# Patient Record
Sex: Female | Born: 1940 | Race: White | Hispanic: No | Smoking: Former smoker
Health system: Southern US, Community
[De-identification: ages and names within clinical notes are randomized; demographics above are authoritative.]

## PROBLEM LIST (undated history)

## (undated) DIAGNOSIS — M797 Fibromyalgia: Secondary | ICD-10-CM

## (undated) DIAGNOSIS — M1990 Unspecified osteoarthritis, unspecified site: Secondary | ICD-10-CM

## (undated) DIAGNOSIS — M353 Polymyalgia rheumatica: Secondary | ICD-10-CM

## (undated) DIAGNOSIS — K3184 Gastroparesis: Secondary | ICD-10-CM

## (undated) DIAGNOSIS — I4891 Unspecified atrial fibrillation: Secondary | ICD-10-CM

## (undated) DIAGNOSIS — K317 Polyp of stomach and duodenum: Secondary | ICD-10-CM

## (undated) DIAGNOSIS — I739 Peripheral vascular disease, unspecified: Secondary | ICD-10-CM

## (undated) DIAGNOSIS — E785 Hyperlipidemia, unspecified: Secondary | ICD-10-CM

## (undated) DIAGNOSIS — E119 Type 2 diabetes mellitus without complications: Secondary | ICD-10-CM

## (undated) DIAGNOSIS — K2980 Duodenitis without bleeding: Secondary | ICD-10-CM

## (undated) DIAGNOSIS — D509 Iron deficiency anemia, unspecified: Secondary | ICD-10-CM

## (undated) DIAGNOSIS — C439 Malignant melanoma of skin, unspecified: Secondary | ICD-10-CM

## (undated) DIAGNOSIS — N281 Cyst of kidney, acquired: Secondary | ICD-10-CM

## (undated) DIAGNOSIS — K635 Polyp of colon: Secondary | ICD-10-CM

## (undated) DIAGNOSIS — I10 Essential (primary) hypertension: Secondary | ICD-10-CM

## (undated) DIAGNOSIS — D649 Anemia, unspecified: Secondary | ICD-10-CM

## (undated) DIAGNOSIS — D126 Benign neoplasm of colon, unspecified: Secondary | ICD-10-CM

## (undated) DIAGNOSIS — H269 Unspecified cataract: Secondary | ICD-10-CM

## (undated) DIAGNOSIS — N200 Calculus of kidney: Secondary | ICD-10-CM

## (undated) DIAGNOSIS — K5790 Diverticulosis of intestine, part unspecified, without perforation or abscess without bleeding: Secondary | ICD-10-CM

## (undated) HISTORY — DX: Unspecified atrial fibrillation: I48.91

## (undated) HISTORY — DX: Polyp of colon: K63.5

## (undated) HISTORY — DX: Diverticulosis of intestine, part unspecified, without perforation or abscess without bleeding: K57.90

## (undated) HISTORY — DX: Cyst of kidney, acquired: N28.1

## (undated) HISTORY — PX: BREAST BIOPSY: SHX20

## (undated) HISTORY — PX: COLONOSCOPY: SHX174

## (undated) HISTORY — DX: Gastroparesis: K31.84

## (undated) HISTORY — DX: Calculus of kidney: N20.0

## (undated) HISTORY — DX: Polymyalgia rheumatica: M35.3

## (undated) HISTORY — DX: Essential (primary) hypertension: I10

## (undated) HISTORY — DX: Hyperlipidemia, unspecified: E78.5

## (undated) HISTORY — DX: Benign neoplasm of colon, unspecified: D12.6

## (undated) HISTORY — PX: POLYPECTOMY: SHX149

## (undated) HISTORY — DX: Malignant melanoma of skin, unspecified: C43.9

## (undated) HISTORY — DX: Duodenitis without bleeding: K29.80

## (undated) HISTORY — DX: Iron deficiency anemia, unspecified: D50.9

## (undated) HISTORY — DX: Anemia, unspecified: D64.9

## (undated) HISTORY — DX: Peripheral vascular disease, unspecified: I73.9

## (undated) HISTORY — PX: APPENDECTOMY: SHX54

## (undated) HISTORY — DX: Polyp of stomach and duodenum: K31.7

## (undated) HISTORY — DX: Type 2 diabetes mellitus without complications: E11.9

## (undated) HISTORY — DX: Fibromyalgia: M79.7

## (undated) HISTORY — DX: Unspecified osteoarthritis, unspecified site: M19.90

## (undated) HISTORY — DX: Unspecified cataract: H26.9

---

## 2015-02-17 ENCOUNTER — Ambulatory Visit: Payer: Self-pay | Admitting: Cardiology

## 2015-02-24 ENCOUNTER — Ambulatory Visit: Payer: Self-pay | Admitting: Cardiology

## 2015-04-03 NOTE — Progress Notes (Signed)
HPI: 74 year old female for evaluation of atrial fibrillation. Patient apparently has a history of paroxysmal atrial fibrillation. She was treated in [place] previously. She had negative nuclear study approximately 10 years ago. She moved here in July 2015. Over the past 5 months she has had several episodes of atrial fibrillation. She states she generally "feels bad". She goes to bed and her symptoms resolved. She does not have associated palpitations, dyspnea, chest pain or syncope. In the past she did have jaw pain when her rate was elevated. She otherwise has mild dyspnea on exertion but no orthopnea, PND, pedal edema, syncope, bleeding or exertional chest pain.  Current Outpatient Prescriptions  Medication Sig Dispense Refill  . atorvastatin (LIPITOR) 40 MG tablet Take 40 mg by mouth daily.    . busPIRone (BUSPAR) 15 MG tablet Take 15 mg by mouth as needed.    . Cholecalciferol (VITAMIN D3) 1000 UNITS CAPS Take 1 tablet by mouth daily.    . empagliflozin (JARDIANCE) 10 MG TABS tablet Take 10 mg by mouth daily.    Marland Kitchen glimepiride (AMARYL) 4 MG tablet Take 8 mg by mouth daily.    Marland Kitchen HYDROcodone-acetaminophen (NORCO/VICODIN) 5-325 MG per tablet Take 1 tablet by mouth as needed.    Marland Kitchen lisinopril (PRINIVIL,ZESTRIL) 2.5 MG tablet Take 2.5 mg by mouth daily.    . metoprolol tartrate (LOPRESSOR) 25 MG tablet Take 1 tablet by mouth 2 (two) times daily.    . rivaroxaban (XARELTO) 20 MG TABS tablet Take 20 mg by mouth daily.    . sertraline (ZOLOFT) 50 MG tablet Take 100 mg by mouth daily.    . vitamin B-12 (CYANOCOBALAMIN) 1000 MCG tablet Take 1,000 mcg by mouth daily.     No current facility-administered medications for this visit.    Allergies  Allergen Reactions  . Metoclopramide Other (See Comments)    Tardive dyskinesia     Past Medical History  Diagnosis Date  . Diabetes mellitus   . Hypertension   . Hyperlipidemia   . Atrial fibrillation   . Gastroparesis   . Fibromyalgia   .  PMR (polymyalgia rheumatica)     Past Surgical History  Procedure Laterality Date  . Cesarean section    . Appendectomy    . Breast biopsy      History   Social History  . Marital Status: Married    Spouse Name: N/A  . Number of Children: 1  . Years of Education: N/A   Occupational History  . Not on file.   Social History Main Topics  . Smoking status: Current Every Day Smoker  . Smokeless tobacco: Not on file  . Alcohol Use: No  . Drug Use: Not on file  . Sexual Activity: Not on file   Other Topics Concern  . Not on file   Social History Narrative  . No narrative on file    Family History  Problem Relation Age of Onset  . CAD Father     MI and CABG    ROS: claudication but no fevers or chills, productive cough, hemoptysis, dysphasia, odynophagia, melena, hematochezia, dysuria, hematuria, rash, seizure activity, orthopnea, PND, pedal edema. Remaining systems are negative.  Physical Exam:   There were no vitals taken for this visit.  General:  Well developed/well nourished in NAD Skin warm/dry Patient not depressed No peripheral clubbing Back-normal HEENT-normal/normal eyelids Neck supple/normal carotid upstroke bilaterally; bilateral bruits; no JVD; no thyromegaly chest - CTA/ normal expansion CV - RRR/normal S1 and S2;  no rubs or gallops;  PMI nondisplaced, 2/6 systolic murmur LSB Abdomen -NT/ND, no HSM, no mass, + bowel sounds, positive bruit 2+ femoral pulses, no bruits Ext-no edema, chords, 1+ DP Neuro-grossly nonfocal  ECG  NSR, no ST changes

## 2015-04-04 ENCOUNTER — Encounter: Payer: Self-pay | Admitting: Cardiology

## 2015-04-04 ENCOUNTER — Telehealth: Payer: Self-pay | Admitting: Cardiology

## 2015-04-04 ENCOUNTER — Encounter: Payer: Self-pay | Admitting: *Deleted

## 2015-04-04 ENCOUNTER — Ambulatory Visit (INDEPENDENT_AMBULATORY_CARE_PROVIDER_SITE_OTHER): Payer: Medicare HMO | Admitting: Cardiology

## 2015-04-04 VITALS — BP 128/72 | HR 66 | Ht 64.0 in | Wt 148.6 lb

## 2015-04-04 DIAGNOSIS — I10 Essential (primary) hypertension: Secondary | ICD-10-CM | POA: Insufficient documentation

## 2015-04-04 DIAGNOSIS — R0989 Other specified symptoms and signs involving the circulatory and respiratory systems: Secondary | ICD-10-CM | POA: Diagnosis not present

## 2015-04-04 DIAGNOSIS — E785 Hyperlipidemia, unspecified: Secondary | ICD-10-CM | POA: Insufficient documentation

## 2015-04-04 DIAGNOSIS — I480 Paroxysmal atrial fibrillation: Secondary | ICD-10-CM

## 2015-04-04 DIAGNOSIS — I739 Peripheral vascular disease, unspecified: Secondary | ICD-10-CM

## 2015-04-04 DIAGNOSIS — Z720 Tobacco use: Secondary | ICD-10-CM

## 2015-04-04 DIAGNOSIS — I779 Disorder of arteries and arterioles, unspecified: Secondary | ICD-10-CM | POA: Insufficient documentation

## 2015-04-04 DIAGNOSIS — I4891 Unspecified atrial fibrillation: Secondary | ICD-10-CM

## 2015-04-04 LAB — CBC
HCT: 42.8 % (ref 36.0–46.0)
Hemoglobin: 15.3 g/dL — ABNORMAL HIGH (ref 12.0–15.0)
MCH: 31 pg (ref 26.0–34.0)
MCHC: 35.7 g/dL (ref 30.0–36.0)
MCV: 86.6 fL (ref 78.0–100.0)
MPV: 9.8 fL (ref 8.6–12.4)
PLATELETS: 314 10*3/uL (ref 150–400)
RBC: 4.94 MIL/uL (ref 3.87–5.11)
RDW: 14.7 % (ref 11.5–15.5)
WBC: 9.7 10*3/uL (ref 4.0–10.5)

## 2015-04-04 NOTE — Assessment & Plan Note (Signed)
Patient counseled on discontinuing. 

## 2015-04-04 NOTE — Assessment & Plan Note (Signed)
Continue aspirin and statin. She does have some claudication. We will consider referral for PV procedures in the future if her symptoms worsen. She has an abdominal bruit. She also has carotid bruits. Check carotid Dopplers and abdominal ultrasound to exclude aneurysm.

## 2015-04-04 NOTE — Telephone Encounter (Signed)
Faxed Release for Information (signed by patient) to [facility]. to obtain records per Dr Stanford Breed.   Faxed to [phone] on 04/04/15. lp

## 2015-04-04 NOTE — Assessment & Plan Note (Signed)
Continue statin. 

## 2015-04-04 NOTE — Assessment & Plan Note (Signed)
Blood pressure controlled. Continue present medications. Check potassium and renal function. 

## 2015-04-04 NOTE — Assessment & Plan Note (Signed)
Patient is having episodes of paroxysmal atrial fibrillationthat are symptomatic. Continue metoprolol. I cannot advance as her heart rate is borderline. She has embolic risk factors of female sex, age greater than 57, diabetes mellitus and hypertension. CHADS vasc 4. Continue xarelto. Check hemoglobin and renal function. Check TSH. Check echocardiogram. Obtain previous records. If she has more frequent episodes in the future we will consider an antiarrhythmic. I would most likely choose tikosyn.

## 2015-04-04 NOTE — Patient Instructions (Signed)
Your physician wants you to follow-up in: [place] will receive a reminder letter in the mail two months in advance. If you don't receive a letter, please call our office to schedule the follow-up appointment.   Your physician has requested that you have an echocardiogram. Echocardiography is a painless test that uses sound waves to create images of your heart. It provides your doctor with information about the size and shape of your heart and how well your heart's chambers and valves are working. This procedure takes approximately one hour. There are no restrictions for this procedure.   Your physician recommends that you HAVE LAB WORK TODAY  Your physician has requested that you have a carotid duplex. This test is an ultrasound of the carotid arteries in your neck. It looks at blood flow through these arteries that supply the brain with blood. Allow one hour for this exam. There are no restrictions or special instructions.   Your physician has requested that you have an abdominal aorta duplex. During this test, an ultrasound is used to evaluate the aorta. Allow 30 minutes for this exam. Do not eat after midnight the day before and avoid carbonated beverages

## 2015-04-05 LAB — BASIC METABOLIC PANEL WITH GFR
BUN: 27 mg/dL — AB (ref 6–23)
CALCIUM: 9.2 mg/dL (ref 8.4–10.5)
CO2: 26 mEq/L (ref 19–32)
CREATININE: 0.88 mg/dL (ref 0.50–1.10)
Chloride: 105 mEq/L (ref 96–112)
GFR, EST AFRICAN AMERICAN: 75 mL/min
GFR, EST NON AFRICAN AMERICAN: 65 mL/min
Glucose, Bld: 114 mg/dL — ABNORMAL HIGH (ref 70–99)
Potassium: 4.2 mEq/L (ref 3.5–5.3)
Sodium: 140 mEq/L (ref 135–145)

## 2015-04-05 LAB — TSH: TSH: 0.975 u[IU]/mL (ref 0.350–4.500)

## 2015-04-20 ENCOUNTER — Encounter (HOSPITAL_COMMUNITY): Payer: Medicare HMO

## 2015-04-20 ENCOUNTER — Ambulatory Visit (HOSPITAL_COMMUNITY): Payer: Medicare HMO

## 2015-04-24 ENCOUNTER — Other Ambulatory Visit: Payer: Self-pay | Admitting: Cardiology

## 2015-04-24 DIAGNOSIS — R0989 Other specified symptoms and signs involving the circulatory and respiratory systems: Secondary | ICD-10-CM

## 2015-04-24 DIAGNOSIS — I4891 Unspecified atrial fibrillation: Secondary | ICD-10-CM

## 2015-05-03 ENCOUNTER — Encounter: Payer: Self-pay | Admitting: Nurse Practitioner

## 2015-05-16 ENCOUNTER — Ambulatory Visit (HOSPITAL_BASED_OUTPATIENT_CLINIC_OR_DEPARTMENT_OTHER)
Admission: RE | Admit: 2015-05-16 | Discharge: 2015-05-16 | Disposition: A | Discharge status: Home / Self Care | Payer: Medicare HMO | Source: Ambulatory Visit | Attending: Cardiovascular Disease | Admitting: Cardiovascular Disease

## 2015-05-16 ENCOUNTER — Ambulatory Visit (HOSPITAL_COMMUNITY)
Admission: RE | Admit: 2015-05-16 | Discharge: 2015-05-16 | Disposition: A | Discharge status: Home / Self Care | Payer: Medicare HMO | Source: Ambulatory Visit | Attending: Cardiovascular Disease | Admitting: Cardiovascular Disease

## 2015-05-16 DIAGNOSIS — I6523 Occlusion and stenosis of bilateral carotid arteries: Secondary | ICD-10-CM | POA: Diagnosis not present

## 2015-05-16 DIAGNOSIS — E785 Hyperlipidemia, unspecified: Secondary | ICD-10-CM | POA: Insufficient documentation

## 2015-05-16 DIAGNOSIS — Z8249 Family history of ischemic heart disease and other diseases of the circulatory system: Secondary | ICD-10-CM | POA: Diagnosis not present

## 2015-05-16 DIAGNOSIS — E119 Type 2 diabetes mellitus without complications: Secondary | ICD-10-CM | POA: Insufficient documentation

## 2015-05-16 DIAGNOSIS — R0989 Other specified symptoms and signs involving the circulatory and respiratory systems: Secondary | ICD-10-CM

## 2015-05-16 DIAGNOSIS — I10 Essential (primary) hypertension: Secondary | ICD-10-CM | POA: Insufficient documentation

## 2015-05-16 DIAGNOSIS — I4891 Unspecified atrial fibrillation: Secondary | ICD-10-CM | POA: Insufficient documentation

## 2015-05-16 NOTE — Progress Notes (Signed)
Carotid Duplex Completed. Right ICA a mild amount plaque visualized which was not significant. 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  Oda Cogan, BS, RDMS, RVT

## 2015-05-16 NOTE — Progress Notes (Signed)
Abdominal Aorta Duplex Completed. Negative for AAA.  Oda Cogan, BS, RDMS, RVT

## 2015-05-18 ENCOUNTER — Ambulatory Visit: Payer: Medicare HMO | Admitting: Nurse Practitioner

## 2015-07-05 ENCOUNTER — Encounter: Payer: Self-pay | Admitting: Nurse Practitioner

## 2015-07-14 ENCOUNTER — Encounter (HOSPITAL_COMMUNITY): Payer: Self-pay | Admitting: *Deleted

## 2015-07-14 ENCOUNTER — Emergency Department (INDEPENDENT_AMBULATORY_CARE_PROVIDER_SITE_OTHER): Payer: Medicare HMO

## 2015-07-14 ENCOUNTER — Emergency Department (HOSPITAL_COMMUNITY)
Admission: EM | Admit: 2015-07-14 | Discharge: 2015-07-14 | Disposition: A | Discharge status: Home / Self Care | Payer: Medicare HMO | Source: Home / Self Care | Attending: Family Medicine | Admitting: Family Medicine

## 2015-07-14 DIAGNOSIS — M545 Low back pain: Secondary | ICD-10-CM

## 2015-07-14 MED ORDER — KETOROLAC TROMETHAMINE 60 MG/2ML IM SOLN
INTRAMUSCULAR | Status: AC
  Filled 2015-07-14: qty 2

## 2015-07-14 MED ORDER — OXYCODONE-ACETAMINOPHEN 5-325 MG PO TABS: 2.0000 | ORAL_TABLET | ORAL | Status: DC | PRN

## 2015-07-14 MED ORDER — METHYLPREDNISOLONE SODIUM SUCC 125 MG IJ SOLR
INTRAMUSCULAR | Status: AC
  Filled 2015-07-14: qty 2

## 2015-07-14 MED ORDER — METHYLPREDNISOLONE SODIUM SUCC 125 MG IJ SOLR
125.0000 mg | Freq: Once | INTRAMUSCULAR | Status: AC
  Administered 2015-07-14: 125 mg via INTRAMUSCULAR

## 2015-07-14 MED ORDER — KETOROLAC TROMETHAMINE 60 MG/2ML IM SOLN
60.0000 mg | Freq: Once | INTRAMUSCULAR | Status: AC
  Administered 2015-07-14: 60 mg via INTRAMUSCULAR

## 2015-07-14 NOTE — ED Provider Notes (Signed)
CSN: [id number]     Arrival date & time 07/14/15  1818 History   First MD Initiated Contact with Patient 07/14/15 1844     Chief Complaint  Patient presents with  . Back Pain   (Consider location/radiation/quality/duration/timing/severity/associated sxs/prior Treatment) Patient is a 74 y.o. female presenting with back pain. The history is provided by the patient. No language interpreter was used.  Back Pain Location:  Lumbar spine Quality:  Aching Radiates to:  Does not radiate Pain severity:  Severe Pain is:  Same all the time Onset quality:  Gradual Timing:  Constant Progression:  Worsening Chronicity:  New Relieved by:  Nothing Worsened by:  Movement Ineffective treatments:  Narcotics Risk factors: no hx of cancer   Pt complains of severe low back pain.  She saw Dr. Jonni Sanger and was started on hydrocodone.  Pt reports no relief.  No injury (Pt had ultrasound of aorta/abd whch was normal this year)  Past Medical History  Diagnosis Date  . Diabetes mellitus   . Hypertension   . Hyperlipidemia   . Atrial fibrillation   . Gastroparesis   . Fibromyalgia   . PMR (polymyalgia rheumatica)   . PVD (peripheral vascular disease)    Past Surgical History  Procedure Laterality Date  . Cesarean section    . Appendectomy    . Breast biopsy     Family History  Problem Relation Age of Onset  . CAD Father     MI and CABG   History  Substance Use Topics  . Smoking status: Current Every Day Smoker  . Smokeless tobacco: Not on file  . Alcohol Use: No   OB History    No data available     Review of Systems  Musculoskeletal: Positive for back pain.  All other systems reviewed and are negative.   Allergies  Metoclopramide  Home Medications   Prior to Admission medications   Medication Sig Start Date End Date Taking? Authorizing Provider  atorvastatin (LIPITOR) 40 MG tablet Take 40 mg by mouth daily. 01/18/15   Historical Provider, MD  busPIRone (BUSPAR) 15 MG tablet Take  15 mg by mouth as needed. 03/17/15 03/16/16  Historical Provider, MD  Cholecalciferol (VITAMIN D3) 1000 UNITS CAPS Take 1 tablet by mouth daily.    Historical Provider, MD  empagliflozin (JARDIANCE) 10 MG TABS tablet Take 10 mg by mouth daily. 01/24/15   Historical Provider, MD  glimepiride (AMARYL) 4 MG tablet Take 8 mg by mouth daily. 03/17/15   Historical Provider, MD  HYDROcodone-acetaminophen (NORCO/VICODIN) 5-325 MG per tablet Take 1 tablet by mouth as needed. 03/17/15   Historical Provider, MD  lisinopril (PRINIVIL,ZESTRIL) 2.5 MG tablet Take 2.5 mg by mouth daily. 12/08/14 12/08/15  Historical Provider, MD  metoprolol tartrate (LOPRESSOR) 25 MG tablet Take 1 tablet by mouth 2 (two) times daily. 03/17/15   Historical Provider, MD  rivaroxaban (XARELTO) 20 MG TABS tablet Take 20 mg by mouth daily. 03/17/15   Historical Provider, MD  sertraline (ZOLOFT) 50 MG tablet Take 100 mg by mouth daily. 03/17/15   Historical Provider, MD  vitamin B-12 (CYANOCOBALAMIN) 1000 MCG tablet Take 1,000 mcg by mouth daily.    Historical Provider, MD   BP 99/66 mmHg  Pulse 57  Temp(Src) 98 F (36.7 C) (Oral)  Resp 16  SpO2 97% Physical Exam  Constitutional: She is oriented to person, place, and time. She appears well-developed and well-nourished.  HENT:  Head: Normocephalic.  Right Ear: External ear normal.  Left Ear: External  ear normal.  Nose: Nose normal.  Eyes: Conjunctivae and EOM are normal. Pupils are equal, round, and reactive to light.  Neck: Normal range of motion.  Pulmonary/Chest: Effort normal.  Abdominal: She exhibits no distension.  Musculoskeletal: She exhibits tenderness.  Limited range of motion,  Pain with lifting legs.  Neurological: She is alert and oriented to person, place, and time.  Skin: Skin is warm.  Psychiatric: She has a normal mood and affect.  Nursing note and vitals reviewed.   ED Course  Procedures (including critical care time) Labs Review Labs Reviewed - No data to  display  Imaging Review Dg Lumbar Spine Complete  07/14/2015   CLINICAL DATA:  Low back pain.  EXAM: LUMBAR SPINE - COMPLETE 4+ VIEW  COMPARISON:  None  FINDINGS: Normal alignment of the lumbar spine. The vertebral body heights are well preserved. Disc space narrowing and ventral endplate spurring is noted. This is most advanced at L2-3. No fractures or subluxations. Aortic calcifications noted.  IMPRESSION: 1. Lumbar degenerative disc disease. 2. Aortic atherosclerosis.   Electronically Signed   By: Kerby Moors M.D.   On: 07/14/2015 19:58     MDM   1. Low back pain, unspecified back pain laterality, with sciatica presence unspecified    Solumedrol IM  (pt to monitor glucose and using sliding scale)  TorodolIM    Percocet See Dr. Jonni Sanger for recheck on Monday    [place], [place] 07/14/15 2017

## 2015-07-14 NOTE — ED Notes (Signed)
Pt    Reports        Back  Pain           X  6  Days         Seen  By  pcp    5  Days  Ago  Was  rx  lortab      -  Not  Working  Still  Having pain     pt  denys    Any  specefic  Injury  denys  Any        Urinary  Symptoms

## 2015-07-14 NOTE — Discharge Instructions (Signed)
Back Pain, Adult Low back pain is very common. About 1 in 5 people have back pain.The cause of low back pain is rarely dangerous. The pain often gets better over time.About half of people with a sudden onset of back pain feel better in just 2 weeks. About 8 in 10 people feel better by 6 weeks.  CAUSES Some common causes of back pain include:  Strain of the muscles or ligaments supporting the spine.  Wear and tear (degeneration) of the spinal discs.  Arthritis.  Direct injury to the back. DIAGNOSIS Most of the time, the direct cause of low back pain is not known.However, back pain can be treated effectively even when the exact cause of the pain is unknown.Answering your caregiver's questions about your overall health and symptoms is one of the most accurate ways to make sure the cause of your pain is not dangerous. If your caregiver needs more information, he or she may order lab work or imaging tests (X-rays or MRIs).However, even if imaging tests show changes in your back, this usually does not require surgery. HOME CARE INSTRUCTIONS For many people, back pain returns.Since low back pain is rarely dangerous, it is often a condition that people can learn to manageon their own.   Remain active. It is stressful on the back to sit or stand in one place. Do not sit, drive, or stand in one place for more than 30 minutes at a time. Take short walks on level surfaces as soon as pain allows.Try to increase the length of time you walk each day.  Do not stay in bed.Resting more than 1 or 2 days can delay your recovery.  Do not avoid exercise or work.Your body is made to move.It is not dangerous to be active, even though your back may hurt.Your back will likely heal faster if you return to being active before your pain is gone.  Pay attention to your body when you bend and lift. Many people have less discomfortwhen lifting if they bend their knees, keep the load close to their bodies,and  avoid twisting. Often, the most comfortable positions are those that put less stress on your recovering back.  Find a comfortable position to sleep. Use a firm mattress and lie on your side with your knees slightly bent. If you lie on your back, put a pillow under your knees.  Only take over-the-counter or prescription medicines as directed by your caregiver. Over-the-counter medicines to reduce pain and inflammation are often the most helpful.Your caregiver may prescribe muscle relaxant drugs.These medicines help dull your pain so you can more quickly return to your normal activities and healthy exercise.  Put ice on the injured area.  Put ice in a plastic bag.  Place a towel between your skin and the bag.  Leave the ice on for 15-20 minutes, 03-04 times a day for the first 2 to 3 days. After that, ice and heat may be alternated to reduce pain and spasms.  Ask your caregiver about trying back exercises and gentle massage. This may be of some benefit.  Avoid feeling anxious or stressed.Stress increases muscle tension and can worsen back pain.It is important to recognize when you are anxious or stressed and learn ways to manage it.Exercise is a great option. SEEK MEDICAL CARE IF:  You have pain that is not relieved with rest or medicine.  You have pain that does not improve in 1 week.  You have new symptoms.  You are generally not feeling well. SEEK   IMMEDIATE MEDICAL CARE IF:   You have pain that radiates from your back into your legs.  You develop new bowel or bladder control problems.  You have unusual weakness or numbness in your arms or legs.  You develop nausea or vomiting.  You develop abdominal pain.  You feel faint. Document Released: 12/16/2005 Document Revised: 06/16/2012 Document Reviewed: 04/19/2014 ExitCare Patient Information 2015 [employer]. This information is not intended to replace advice given to you by your health care provider. Make sure you  discuss any questions you have with your health care provider.  

## 2015-07-28 ENCOUNTER — Ambulatory Visit (INDEPENDENT_AMBULATORY_CARE_PROVIDER_SITE_OTHER): Payer: Medicare HMO | Admitting: Nurse Practitioner

## 2015-07-28 ENCOUNTER — Telehealth: Payer: Self-pay

## 2015-07-28 ENCOUNTER — Encounter: Payer: Self-pay | Admitting: Nurse Practitioner

## 2015-07-28 VITALS — BP 112/70 | HR 58 | Ht 63.5 in | Wt 156.0 lb

## 2015-07-28 DIAGNOSIS — K5900 Constipation, unspecified: Secondary | ICD-10-CM | POA: Insufficient documentation

## 2015-07-28 DIAGNOSIS — Z7901 Long term (current) use of anticoagulants: Secondary | ICD-10-CM | POA: Diagnosis not present

## 2015-07-28 DIAGNOSIS — Z800 Family history of malignant neoplasm of digestive organs: Secondary | ICD-10-CM | POA: Diagnosis not present

## 2015-07-28 MED ORDER — NA SULFATE-K SULFATE-MG SULF 17.5-3.13-1.6 GM/177ML PO SOLN: 1.0000 | Freq: Once | ORAL | Status: DC

## 2015-07-28 NOTE — Telephone Encounter (Signed)
07/28/2015   RE: Kathleen Griffith DOB: Jun 13, 1941 MRN: [id number]   Dear Lysle Rubens Stanford Breed and Jonni Sanger,    We have scheduled the above patient for an endoscopic procedure. Our records show that she is on anticoagulation therapy.   Please advise as to how long the patient may come off her therapy of Xarelto prior to the procedure, which is scheduled for 08/02/15.  Please fax back/ or route the completed form at [phone] to [facility].   Sincerely,    Christian Mate CMA(AAMA)

## 2015-07-28 NOTE — Patient Instructions (Signed)
You have been scheduled for a colonoscopy. Please follow written instructions given to you at your visit today.  Please pick up your prep supplies at the pharmacy within the next 1-3 days. If you use inhalers (even only as needed), please bring them with you on the day of your procedure. Your physician has requested that you go to www.startemmi.com and enter the access code given to you at your visit today. This web site gives a general overview about your procedure. However, you should still follow specific instructions given to you by our office regarding your preparation for the procedure.  You will be contacted by our office prior to your procedure for directions on holding your Xarelto.  If you do not hear from our office 1 week prior to your scheduled procedure, please call [phone] to discuss.   Try miralax as directed per Nevin Bloodgood.

## 2015-07-28 NOTE — Telephone Encounter (Signed)
Hold xarelto 2 days prior to procedure and resume day after Brian Crenshaw  

## 2015-07-28 NOTE — Telephone Encounter (Signed)
Pt has been notified.

## 2015-07-28 NOTE — Progress Notes (Signed)
HPI :   patient is a 74 year old female referred by PCP for colon cancer screening. She has a [facility] of colon cancer in mother (in her 56's), and a personal history of polyps. Last colonoscopy at [facility] in 2010 or 2011, told she needed repeat in 5 years but prior to that she been having them every 1-2 years. No GI complaints except that she  has been struggling with constipation for a year now. She passes "hard balls of stools". No rectal bleeding. Weight stable.   Patient has atrial fibrillation, on Xarelto. She sees Dr. Stanford Breed.   New onset low back pain. Taking Percocet and Flexeril as needed. No bowel or bladder incontinence   Past Medical History  Diagnosis Date  . Diabetes mellitus   . Hypertension   . Hyperlipidemia   . Atrial fibrillation   . Gastroparesis   . Fibromyalgia   . PMR (polymyalgia rheumatica)   . PVD (peripheral vascular disease)   . Arthritis   . Melanoma   . Colon polyps   . Kidney stones     Family History  Problem Relation Age of Onset  . CAD Father     MI and CABG  . Diabetes Father   . Heart disease Father   . Colon cancer Mother 43   History  Substance Use Topics  . Smoking status: Current Every Day Smoker  . Smokeless tobacco: Never Used  . Alcohol Use: No   Current Outpatient Prescriptions  Medication Sig Dispense Refill  . atorvastatin (LIPITOR) 40 MG tablet Take 40 mg by mouth daily.    . busPIRone (BUSPAR) 15 MG tablet Take 15 mg by mouth as needed.    . Cholecalciferol (VITAMIN D3) 1000 UNITS CAPS Take 1 tablet by mouth daily.    . empagliflozin (JARDIANCE) 10 MG TABS tablet Take 25 mg by mouth daily.     Marland Kitchen glimepiride (AMARYL) 4 MG tablet Take 8 mg by mouth daily.    Marland Kitchen HYDROcodone-acetaminophen (NORCO/VICODIN) 5-325 MG per tablet Take 1 tablet by mouth as needed.    Marland Kitchen lisinopril (PRINIVIL,ZESTRIL) 2.5 MG tablet Take 2.5 mg by mouth daily.    . metoprolol tartrate (LOPRESSOR) 25 MG tablet Take 1 tablet by mouth 2 (two) times  daily.    Marland Kitchen oxyCODONE-acetaminophen (PERCOCET/ROXICET) 5-325 MG per tablet Take 2 tablets by mouth every 4 (four) hours as needed for severe pain. 15 tablet 0  . rivaroxaban (XARELTO) 20 MG TABS tablet Take 20 mg by mouth daily.    . sertraline (ZOLOFT) 50 MG tablet Take 100 mg by mouth daily.    . vitamin B-12 (CYANOCOBALAMIN) 1000 MCG tablet Take 1,000 mcg by mouth daily.     No current facility-administered medications for this visit.   Allergies  Allergen Reactions  . Metoclopramide Other (See Comments)    Tardive dyskinesia     Review of Systems: positive for sinus trouble, arthritis, back pain, fatigue, headaches, heart rhythm changes, muscle pains, and urine leakage. All systems reviewed and negative except where noted in HPI.    Physical Exam: BP 112/70 mmHg  Pulse 58  Ht 5' 3.5" (1.613 m)  Wt 156 lb (70.761 kg)  BMI 27.20 kg/m2 Constitutional: Pleasant,well-developed, white female in no acute distress. HEENT: Normocephalic and atraumatic. Conjunctivae are normal. No scleral icterus. Neck supple.  Cardiovascular: Normal rate, regular rhythm.  Pulmonary/chest: Effort normal and breath sounds normal. No wheezing, rales or rhonchi. Abdominal: Soft, nondistended, nontender. Bowel sounds active throughout. There are no  masses palpable. No hepatomegaly. RLQ surgical scar. Lower midline surgical scar Extremities: no edema Lymphadenopathy: No cervical adenopathy noted. Neurological: Alert and oriented to person place and time. Skin: Skin is warm and dry. No rashes noted. Psychiatric: Normal mood and affect. Behavior is normal.   ASSESSMENT AND PLAN:  36.  74 year old female, retired Therapist, sports, with a history of colon polyps (apparently recurrent) done at outside facilities. Last colonoscopy 2010 or 2011, told she needed repeat in 5 years. Patient also has a [facility] of colon cancer in mother albeit in her 56's). Will schedule patient for surveillance colonoscopy. The risks, benefits, and  alternatives to colonoscopy with possible biopsy and possible polypectomy were discussed with the patient and she consents to proceed. Hold Xarelto for 2 days prior to procedure - will instruct when and how to resume after procedure. Will communicate by phone or EMR with patient's prescribing provider to confirm that holding Xarelto is reasonable in this case. Patient requesting use of Pediatric scope. She has been told that her colon is extremely tortuous.    2. Constipation, one year history. She plans to try Miralax and this is reasonable.   3. Atrial fibrillation, on Xarelto / Diabetes.   4. New onset back ain, started three weeks ago. Seen at Urgent Care, got steroid injection and Toradol which helped. Xray revealed lumbar DDD.   Cc: Billey Chang, MD

## 2015-07-31 NOTE — Progress Notes (Signed)
Reviewed and agree with management. Caree Wolpert D. [place] Fanfan, M.D., FACG  

## 2015-08-01 ENCOUNTER — Ambulatory Visit: Payer: Medicare HMO | Attending: Family Medicine | Admitting: Physical Therapy

## 2015-08-01 ENCOUNTER — Encounter: Payer: Self-pay | Admitting: Physical Therapy

## 2015-08-01 DIAGNOSIS — M545 Low back pain, unspecified: Secondary | ICD-10-CM

## 2015-08-01 DIAGNOSIS — M533 Sacrococcygeal disorders, not elsewhere classified: Secondary | ICD-10-CM | POA: Insufficient documentation

## 2015-08-01 DIAGNOSIS — R531 Weakness: Secondary | ICD-10-CM | POA: Insufficient documentation

## 2015-08-01 DIAGNOSIS — G8929 Other chronic pain: Secondary | ICD-10-CM | POA: Diagnosis present

## 2015-08-01 NOTE — Therapy (Signed)
[address] [place], [place], [address] Phone: [phone]   Fax:  [phone]  Physical Therapy Evaluation  Patient Details  Name: Kathleen Griffith MRN: [id number] Date of Birth: 1941/06/04 Referring Provider:  Leamon Arnt, MD  Encounter Date: 08/01/2015      PT End of Session - 08/01/15 1343    Visit Number 1   Number of Visits 12   Authorization Type [employer]   PT Start Time 1300   PT Stop Time 1400   PT Time Calculation (min) 60 min   Activity Tolerance Patient tolerated treatment well   Behavior During Therapy [facility] for tasks assessed/performed      Past Medical History  Diagnosis Date  . Diabetes mellitus   . Hypertension   . Hyperlipidemia   . Atrial fibrillation   . Gastroparesis   . Fibromyalgia   . PMR (polymyalgia rheumatica)   . PVD (peripheral vascular disease)   . Arthritis   . Melanoma   . Colon polyps   . Kidney stones     Past Surgical History  Procedure Laterality Date  . Cesarean section    . Appendectomy    . Breast biopsy      There were no vitals filed for this visit.  Visit Diagnosis:  Left-sided low back pain without sciatica - Plan: PT plan of care cert/re-cert  Weakness - Plan: PT plan of care cert/re-cert      Subjective Assessment - 08/01/15 1307    Subjective Patient with c/o acute low back pain, July 9th she woke up with severe pain, unsure of any cause.  Reports that pain meds helped decrease pain.   Limitations Standing;Walking   How long can you stand comfortably? 5 minutes   Diagnostic tests x-rays, spondylosis   Patient Stated Goals have less pain   Currently in Pain? Yes   Pain Score 5    Pain Location Back   Pain Orientation Lower;Left   Pain Descriptors / Indicators Aching   Pain Type Acute pain   Pain Onset 1 to 4 weeks ago   Pain Frequency Constant   Aggravating Factors  Standing, walking, trying to clean the home, up to 9/10 with  activity   Pain Relieving Factors pain meds, heat, at best a 4/10   Effect of Pain on Daily Activities limits everything            [facility] PT Assessment - 08/01/15 0001    Assessment   Medical Diagnosis low back pain   Onset Date/Surgical Date 07/08/15   Prior Therapy no   Precautions   Precautions None   Balance Screen   Has the patient fallen in the past 6 months No   Has the patient had a decrease in activity level because of a fear of falling?  No   Is the patient reluctant to leave their home because of a fear of falling?  No  reports had a few falls a few years ago, unsure of why   [place] residence   Additional Comments 3 steps into the home   Prior Function   Level of [place] Retired   Leisure no exercise   AROM   Overall AROM Comments Lumbar ROM WFL's for flexion decreased 50% for other motions   Strength   Overall Strength Comments Hip strength 3+/5, knees and ankles 4-/5   Flexibility   Soft Tissue Assessment Lindaann Slough  Length --  tight HS, tight and painful left piriformis   Palpation   Palpation comment she is very tender on the left SI area and into the buttock, left leg was longer in supine,    Special Tests    Special Tests --  negative slump   Sacroiliac Tests  Gaenslen's Test                   [facility] Adult PT Treatment/Exercise - 18-Aug-2015 0001    Modalities   Modalities Electrical Stimulation;Moist Heat   Moist Heat Therapy   Number Minutes Moist Heat 15 Minutes   Moist Heat Location Lumbar Spine   Electrical Stimulation   Electrical Stimulation Location left SI area   Electrical Stimulation Action IFC   Electrical Stimulation Parameters tolerance   Electrical Stimulation Goals Pain                  PT Short Term Goals - 08/18/2015 1347    PT SHORT TERM GOAL #1   Title independent with initial HEP   Time 2   Period Weeks   Status New           PT Long  Term Goals - Aug 18, 2015 1348    PT LONG TERM GOAL #1   Title understand proper posture and body mechanics   Time 6   Period Weeks   Status New   PT LONG TERM GOAL #2   Title decrease pain 50%   Time 6   Period Weeks   Status New   PT LONG TERM GOAL #3   Title increase lumbar side bending 25%   Time 6   Period Weeks   Status New   PT LONG TERM GOAL #4   Title tolerate standing 10 minutes   Time 6   Period Weeks   Status New   PT LONG TERM GOAL #5   Title vacuum without increase of pain   Time 6   Period Weeks   Status New               Plan - August 18, 2015 1345    Pt will benefit from skilled therapeutic intervention in order to improve on the following deficits Decreased range of motion;Decreased strength;Impaired flexibility;Increased muscle spasms;Pain   Rehab Potential Good   PT Frequency 2x / week   PT Duration 6 weeks   PT Treatment/Interventions ADLs/Self Care Home Management;Electrical Stimulation;Moist Heat;Traction;Ultrasound;Therapeutic exercise;Therapeutic activities;Patient/family education;Manual techniques;Passive range of motion;Taping   PT Next Visit Plan may need to check SI as the left leg was longer in supine than in long sitting   Consulted and Agree with Plan of Care Patient          G-Codes - 08-18-2015 1349    Functional Assessment Tool Used foto   Functional Limitation Other PT primary   Other PT Primary Current Status [id number]) At least 60 percent but less than 80 percent impaired, limited or restricted   Other PT Primary Goal Status [id number]) At least 40 percent but less than 60 percent impaired, limited or restricted       Problem List Patient Active Problem List   Diagnosis Date Noted  . Chronic anticoagulation 07/28/2015  . Family history of colon cancer 07/28/2015  . CN (constipation) 07/28/2015  . Paroxysmal atrial fibrillation 04/04/2015  . Peripheral vascular disease 04/04/2015  . Tobacco abuse 04/04/2015  . Essential hypertension  04/04/2015  . Hyperlipidemia 04/04/2015    Sumner Boast., PT 2015/08/18, 2:06 PM  [facility]  [address] [place], [place], [address] Phone: [phone]   Fax:  [phone]

## 2015-08-01 NOTE — Patient Instructions (Signed)
Knee-to-Chest: with Neck Flexion Stretch (Supine)   Pull left knee to chest, tucking chin and lifting head. Hold __10__ seconds. Relax. Repeat _10___ times per set. Do _2___ sets per session. Do __2__ sessions per day.  Trunk: Knees to Chest   Lie on firm, flat surface. Keep head and shoulders flat on surface. Tuck hands behind knees and pull to chest. Hold _10___ seconds. Repeat __10__ times. Do _2___ sessions per day. CAUTION: Movement should be gentle and slow.  Caudal Rotation: Hip Roll, Neutral Lordosis - Supine   Lie with knees bent and slightly elevated, feet flat. Tighten stomach, lower knees out to right side, rotating hips and trunk. Keep stomach tight for return. Repeat _10___ times per set. Do __2__ sets per session. Do _2___ sessions per week.  Pelvic Tilt: Anterior - Legs Bent (Supine)   Rotate pelvis up and arch back. Hold ____ seconds. Relax. Repeat ____ times per set. Do ____ sets per session. Do ____ sessions per day.  Piriformis (Supine)   Cross legs, right on top. Gently pull other knee toward chest until stretch is felt in buttock/hip of top leg. Hold _20___ seconds. Repeat _10___ times per set. Do _2___ sets per session. Do _2___ sessions per day. Marland Kitchen

## 2015-08-02 ENCOUNTER — Encounter: Payer: Self-pay | Admitting: Gastroenterology

## 2015-08-02 ENCOUNTER — Ambulatory Visit (AMBULATORY_SURGERY_CENTER): Payer: Medicare HMO | Admitting: Gastroenterology

## 2015-08-02 VITALS — BP 118/72 | HR 62 | Temp 95.8°F | Resp 16 | Ht 63.5 in | Wt 156.0 lb

## 2015-08-02 DIAGNOSIS — Z800 Family history of malignant neoplasm of digestive organs: Secondary | ICD-10-CM | POA: Diagnosis not present

## 2015-08-02 DIAGNOSIS — Z86010 Personal history of colonic polyps: Secondary | ICD-10-CM | POA: Diagnosis not present

## 2015-08-02 LAB — GLUCOSE, CAPILLARY
Glucose-Capillary: 119 mg/dL — ABNORMAL HIGH (ref 65–99)
Glucose-Capillary: 99 mg/dL (ref 65–99)

## 2015-08-02 MED ORDER — SODIUM CHLORIDE 0.9 % IV SOLN: 500.0000 mL | INTRAVENOUS | Status: DC

## 2015-08-02 NOTE — Op Note (Signed)
[place]  [address] [place], [address]   COLONOSCOPY PROCEDURE REPORT  PATIENT: Kathleen Griffith, Kathleen Griffith  MR#: [id number] BIRTHDATE: [birth year] , 74  yrs. old GENDER: female ENDOSCOPIST: Inda Castle, MD REFERRED BY: PROCEDURE DATE:  08/02/2015 PROCEDURE:   Colonoscopy, surveillance First Screening Colonoscopy - Avg.  risk and is 50 yrs.  old or older - No.  Prior Negative Screening - Now for repeat screening. Above average risk  History of Adenoma - Now for follow-up colonoscopy & has been > or = to 3 yrs.  Yes hx of adenoma.  Has been 3 or more years since last colonoscopy.  Polyps removed today? No Recommend repeat exam, <10 yrs? No ASA CLASS:   Class II INDICATIONS:FH Colon or Rectal Adenocarcinoma and PH Colon Adenoma.  MEDICATIONS: Monitored anesthesia care and Propofol 320 mg IV  DESCRIPTION OF PROCEDURE:   After the risks benefits and alternatives of the procedure were thoroughly explained, informed consent was obtained.  The digital rectal exam revealed no abnormalities of the rectum.   The LB PCF Q180 [id number]  endoscope was introduced through the anus and advanced to the cecum, which was identified by both the appendix and ileocecal valve. No adverse events experienced.   The quality of the prep was (Suprep was used) excellent.  The instrument was then slowly withdrawn as the colon was fully examined. Estimated blood loss is zero unless otherwise noted in this procedure report.      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   Internal hemorrhoids were found. Retroflexed views revealed no abnormalities. The time to cecum = 12.7 Withdrawal time = 11.1   The scope was withdrawn and the procedure completed. COMPLICATIONS: There were no immediate complications.  ENDOSCOPIC IMPRESSION: 1.   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   Internal  hemorrhoids  RECOMMENDATIONS: Given your age, you will not need another colonoscopy for colon cancer screening or polyp surveillance.  These types of tests usually stop around the age 31. Resume Xarelto today  eSigned:  Inda Castle, MD 08/02/2015 11:09 AM   cc: Billey Chang, MD

## 2015-08-02 NOTE — Patient Instructions (Signed)
YOU HAD AN ENDOSCOPIC PROCEDURE TODAY AT THE [facility]:   Refer to the procedure report that was given to you for any specific questions about what was found during the examination.  If the procedure report does not answer your questions, please call your gastroenterologist to clarify.  If you requested that your care partner not be given the details of your procedure findings, then the procedure report has been included in a sealed envelope for you to review at your convenience later.  YOU SHOULD EXPECT: Some feelings of bloating in the abdomen. Passage of more gas than usual.  Walking can help get rid of the air that was put into your GI tract during the procedure and reduce the bloating. If you had a lower endoscopy (such as a colonoscopy or flexible sigmoidoscopy) you may notice spotting of blood in your stool or on the toilet paper. If you underwent a bowel prep for your procedure, you may not have a normal bowel movement for a few days.  Please Note:  You might notice some irritation and congestion in your nose or some drainage.  This is from the oxygen used during your procedure.  There is no need for concern and it should clear up in a day or so.  SYMPTOMS TO REPORT IMMEDIATELY:   Following lower endoscopy (colonoscopy or flexible sigmoidoscopy):  Excessive amounts of blood in the stool  Significant tenderness or worsening of abdominal pains  Swelling of the abdomen that is new, acute  Fever of 100F or higher   For urgent or emergent issues, a gastroenterologist can be reached at any hour by calling [phone].   DIET: Your first meal following the procedure should be a small meal and then it is ok to progress to your normal diet. Heavy or fried foods are harder to digest and may make you feel nauseous or bloated.  Likewise, meals heavy in dairy and vegetables can increase bloating.  Drink plenty of fluids but you should avoid alcoholic beverages for 24  hours.  ACTIVITY:  You should plan to take it easy for the rest of today and you should NOT DRIVE or use heavy machinery until tomorrow (because of the sedation medicines used during the test).    FOLLOW UP: Our staff will call the number listed on your records the next business day following your procedure to check on you and address any questions or concerns that you may have regarding the information given to you following your procedure. If we do not reach you, we will leave a message.  However, if you are feeling well and you are not experiencing any problems, there is no need to return our call.  We will assume that you have returned to your regular daily activities without incident.  If any biopsies were taken you will be contacted by phone or by letter within the next 1-3 weeks.  Please call us at [phone] if you have not heard about the biopsies in 3 weeks.    SIGNATURES/CONFIDENTIALITY: You and/or your care partner have signed paperwork which will be entered into your electronic medical record.  These signatures attest to the fact that that the information above on your After Visit Summary has been reviewed and is understood.  Full responsibility of the confidentiality of this discharge information lies with you and/or your care-partner. 

## 2015-08-02 NOTE — Progress Notes (Signed)
Report to PACU, RN, vss, BBS= Clear.  

## 2015-08-03 ENCOUNTER — Telehealth: Payer: Self-pay

## 2015-08-03 NOTE — Telephone Encounter (Signed)
  Follow up Call-  Call back number 08/02/2015  Post procedure Call Back phone  # [phone]  Permission to leave phone message Yes     Patient questions:  Do you have a fever, pain , or abdominal swelling? No. Pain Score  0 *  Have you tolerated food without any problems? Yes.    Have you been able to return to your normal activities? Yes.    Do you have any questions about your discharge instructions: Diet   No. Medications  No. Follow up visit  No.  Do you have questions or concerns about your Care? No.  Actions: * If pain score is 4 or above: No action needed, pain <4.

## 2015-08-10 ENCOUNTER — Encounter: Payer: Self-pay | Admitting: Physical Therapy

## 2015-08-10 ENCOUNTER — Ambulatory Visit: Payer: Medicare HMO | Admitting: Physical Therapy

## 2015-08-10 DIAGNOSIS — M533 Sacrococcygeal disorders, not elsewhere classified: Secondary | ICD-10-CM

## 2015-08-10 DIAGNOSIS — M545 Low back pain, unspecified: Secondary | ICD-10-CM

## 2015-08-10 DIAGNOSIS — G8929 Other chronic pain: Secondary | ICD-10-CM

## 2015-08-10 NOTE — Therapy (Signed)
[address] [place], [place], [address] Phone: [phone]   Fax:  [phone]  Physical Therapy Treatment  Patient Details  Name: Kathleen Griffith MRN: [id number] Date of Birth: 1941/02/07 Referring Provider:  Leamon Arnt, MD  Encounter Date: 08/10/2015      PT End of Session - 08/10/15 1615    Visit Number 2   Number of Visits 12   Activity Tolerance Patient tolerated treatment well   Behavior During Therapy [facility] for tasks assessed/performed      Past Medical History  Diagnosis Date  . Diabetes mellitus   . Hypertension   . Hyperlipidemia   . Atrial fibrillation   . Gastroparesis   . Fibromyalgia   . PMR (polymyalgia rheumatica)   . PVD (peripheral vascular disease)   . Arthritis   . Melanoma   . Colon polyps   . Kidney stones   . Cataract     REMOVED BILATERAL    Past Surgical History  Procedure Laterality Date  . Cesarean section    . Appendectomy    . Breast biopsy    . Colonoscopy    . Polypectomy      There were no vitals filed for this visit.  Visit Diagnosis:  Left-sided low back pain without sciatica  Chronic left SI joint pain      Subjective Assessment - 08/10/15 1612    Subjective Reports that she felt a little better for a day after last treatment, reports that cleaning kitchen yesterday really increased the pain after standing and some lifting   Currently in Pain? Yes   Pain Score 6    Pain Location Back   Pain Orientation Lower;Left   Pain Descriptors / Indicators Aching                         [facility] Adult PT Treatment/Exercise - 08/10/15 0001    Lumbar Exercises: Supine   Glut Set 10 reps;2 seconds   Heel Slides 20 reps   Bent Knee Raise 20 reps;2 seconds   Bridge 20 reps;2 seconds   Isometric Hip Flexion 15 reps;2 seconds   Large Ball Oblique Isometric 20 reps;2 seconds   Other Supine Lumbar Exercises PROM of the HS and piriformis  mms   Modalities   Modalities Electrical Stimulation;Moist Heat   Moist Heat Therapy   Number Minutes Moist Heat 15 Minutes   Moist Heat Location Lumbar Spine   Electrical Stimulation   Electrical Stimulation Location left SI area   Electrical Stimulation Action IFC   Electrical Stimulation Parameters tolerance pain   Manual Therapy   Manual Therapy Myofascial release   Myofascial Release to left SI area and intot he lumbar area and buttock                  PT Short Term Goals - 08/01/15 1347    PT SHORT TERM GOAL #1   Title independent with initial HEP   Time 2   Period Weeks   Status New           PT Long Term Goals - 08/01/15 1348    PT LONG TERM GOAL #1   Title understand proper posture and body mechanics   Time 6   Period Weeks   Status New   PT LONG TERM GOAL #2   Title decrease pain 50%   Time 6   Period Weeks   Status New  PT LONG TERM GOAL #3   Title increase lumbar side bending 25%   Time 6   Period Weeks   Status New   PT LONG TERM GOAL #4   Title tolerate standing 10 minutes   Time 6   Period Weeks   Status New   PT LONG TERM GOAL #5   Title vacuum without increase of pain   Time 6   Period Weeks   Status New               Plan - 08/10/15 1615    Clinical Impression Statement very tender and sore in the left SI area, motions increase pain, very tight in the HS and piriformis.  Pain with transitional movements   PT Next Visit Plan may look at MET   Consulted and Agree with Plan of Care Patient        Problem List Patient Active Problem List   Diagnosis Date Noted  . Chronic anticoagulation 07/28/2015  . Family history of colon cancer 07/28/2015  . CN (constipation) 07/28/2015  . Paroxysmal atrial fibrillation 04/04/2015  . Peripheral vascular disease 04/04/2015  . Tobacco abuse 04/04/2015  . Essential hypertension 04/04/2015  . Hyperlipidemia 04/04/2015    Sumner Boast., PT 08/10/2015, 4:17 PM  [address] [place], [place], [address] Phone: [phone]   Fax:  [phone]

## 2015-08-15 ENCOUNTER — Encounter: Payer: Self-pay | Admitting: Physical Therapy

## 2015-08-15 ENCOUNTER — Ambulatory Visit: Payer: Medicare HMO | Admitting: Physical Therapy

## 2015-08-15 DIAGNOSIS — M545 Low back pain, unspecified: Secondary | ICD-10-CM

## 2015-08-15 DIAGNOSIS — R531 Weakness: Secondary | ICD-10-CM

## 2015-08-15 DIAGNOSIS — G8929 Other chronic pain: Secondary | ICD-10-CM

## 2015-08-15 DIAGNOSIS — M533 Sacrococcygeal disorders, not elsewhere classified: Secondary | ICD-10-CM

## 2015-08-15 NOTE — Therapy (Signed)
[place] [address] [place], [place], [address] Phone: [phone]   Fax:  [phone]  Physical Therapy Treatment  Patient Details  Name: Kathleen Griffith MRN: [id number] Date of Birth: 22-Apr-1941 Referring Provider:  Leamon Arnt, MD  Encounter Date: 08/15/2015      PT End of Session - 08/15/15 1444    Visit Number 3   Number of Visits 12   PT Start Time 1400   PT Stop Time 1501   PT Time Calculation (min) 61 min   Activity Tolerance Patient tolerated treatment well   Behavior During Therapy [facility] for tasks assessed/performed      Past Medical History  Diagnosis Date  . Diabetes mellitus   . Hypertension   . Hyperlipidemia   . Atrial fibrillation   . Gastroparesis   . Fibromyalgia   . PMR (polymyalgia rheumatica)   . PVD (peripheral vascular disease)   . Arthritis   . Melanoma   . Colon polyps   . Kidney stones   . Cataract     REMOVED BILATERAL    Past Surgical History  Procedure Laterality Date  . Cesarean section    . Appendectomy    . Breast biopsy    . Colonoscopy    . Polypectomy      There were no vitals filed for this visit.  Visit Diagnosis:  Left-sided low back pain without sciatica  Chronic left SI joint pain  Weakness      Subjective Assessment - 08/15/15 1408    Subjective I felt pretty good after last visit, Saturday went to [employer] and really had increased pain   Currently in Pain? Yes   Pain Score 5    Pain Location Back   Pain Orientation Lower;Left   Pain Descriptors / Indicators Aching   Pain Type Chronic pain   Pain Onset 1 to 4 weeks ago                         [facility] Adult PT Treatment/Exercise - 08/15/15 0001    Lumbar Exercises: Supine   Glut Set 10 reps;2 seconds   Heel Slides 20 reps   Bent Knee Raise 20 reps;2 seconds   Bridge 20 reps;2 seconds   Isometric Hip Flexion 15 reps;2 seconds   Large Ball Oblique Isometric 20 reps;2  seconds   Other Supine Lumbar Exercises PROM of the HS and piriformis mms   Knee/Hip Exercises: Aerobic   Nustep Level 4 x 5 minutes   Moist Heat Therapy   Number Minutes Moist Heat 15 Minutes   Moist Heat Location Lumbar Spine   Electrical Stimulation   Electrical Stimulation Location left SI area   Electrical Stimulation Action IFC   Electrical Stimulation Parameters tolerance   Electrical Stimulation Goals Pain   Manual Therapy   Manual Therapy Myofascial release   Myofascial Release to left SI area and intot he lumbar area and buttock                  PT Short Term Goals - 08/15/15 1537    PT SHORT TERM GOAL #1   Title independent with initial HEP   Status Partially Met           PT Long Term Goals - 08/01/15 1348    PT LONG TERM GOAL #1   Title understand proper posture and body mechanics   Time 6   Period Weeks  Status New   PT LONG TERM GOAL #2   Title decrease pain 50%   Time 6   Period Weeks   Status New   PT LONG TERM GOAL #3   Title increase lumbar side bending 25%   Time 6   Period Weeks   Status New   PT LONG TERM GOAL #4   Title tolerate standing 10 minutes   Time 6   Period Weeks   Status New   PT LONG TERM GOAL #5   Title vacuum without increase of pain   Time 6   Period Weeks   Status New               Plan - 08/15/15 1536    Clinical Impression Statement Very tight piriformis mms, tender in the left buttock.  Difficulty with transfers.     PT Next Visit Plan continue to slowly add exercises, again look at MET for SI   Consulted and Agree with Plan of Care Patient        Problem List Patient Active Problem List   Diagnosis Date Noted  . Chronic anticoagulation 07/28/2015  . Family history of colon cancer 07/28/2015  . CN (constipation) 07/28/2015  . Paroxysmal atrial fibrillation 04/04/2015  . Peripheral vascular disease 04/04/2015  . Tobacco abuse 04/04/2015  . Essential hypertension 04/04/2015  .  Hyperlipidemia 04/04/2015    Sumner Boast., PT 08/15/2015, 3:38 PM  [address] [place] [place], [place], [address] Phone: [phone]   Fax:  [phone]

## 2015-08-22 ENCOUNTER — Ambulatory Visit: Payer: Medicare HMO | Admitting: Physical Therapy

## 2015-08-22 ENCOUNTER — Encounter: Payer: Self-pay | Admitting: Physical Therapy

## 2015-08-22 DIAGNOSIS — G8929 Other chronic pain: Secondary | ICD-10-CM

## 2015-08-22 DIAGNOSIS — M533 Sacrococcygeal disorders, not elsewhere classified: Secondary | ICD-10-CM

## 2015-08-22 DIAGNOSIS — M545 Low back pain, unspecified: Secondary | ICD-10-CM

## 2015-08-22 DIAGNOSIS — R531 Weakness: Secondary | ICD-10-CM

## 2015-08-22 NOTE — Therapy (Signed)
[place] [address] [place], [place], [address] Phone: [phone]   Fax:  [phone]  Physical Therapy Treatment  Patient Details  Name: Kathleen Griffith MRN: [id number] Date of Birth: Apr 29, 1941 Referring Provider:  Leamon Arnt, MD  Encounter Date: 08/22/2015      PT End of Session - 08/22/15 1622    Visit Number 4   Number of Visits 12   PT Start Time 1529   PT Stop Time 1625   PT Time Calculation (min) 56 min   Activity Tolerance Patient tolerated treatment well   Behavior During Therapy [facility] for tasks assessed/performed      Past Medical History  Diagnosis Date  . Diabetes mellitus   . Hypertension   . Hyperlipidemia   . Atrial fibrillation   . Gastroparesis   . Fibromyalgia   . PMR (polymyalgia rheumatica)   . PVD (peripheral vascular disease)   . Arthritis   . Melanoma   . Colon polyps   . Kidney stones   . Cataract     REMOVED BILATERAL    Past Surgical History  Procedure Laterality Date  . Cesarean section    . Appendectomy    . Breast biopsy    . Colonoscopy    . Polypectomy      There were no vitals filed for this visit.  Visit Diagnosis:  Left-sided low back pain without sciatica  Chronic left SI joint pain  Weakness      Subjective Assessment - 08/22/15 1532    Subjective I felt really pretty good for 3 days after the last treatment.     Currently in Pain? Yes   Pain Score 2    Pain Location Back   Pain Orientation Lower   Pain Descriptors / Indicators Aching   Aggravating Factors  activity   Pain Relieving Factors treatment here helps                         [facility] Adult PT Treatment/Exercise - 08/22/15 0001    Lumbar Exercises: Supine   Bridge 20 reps;2 seconds   Isometric Hip Flexion 15 reps;2 seconds   Large Ball Oblique Isometric 20 reps;2 seconds   Other Supine Lumbar Exercises PROM of the HS and piriformis mms   Knee/Hip Exercises:  Aerobic   Nustep Level 4 x 5 minutes   Moist Heat Therapy   Number Minutes Moist Heat 15 Minutes   Moist Heat Location Lumbar Spine   Electrical Stimulation   Electrical Stimulation Location left SI area   Electrical Stimulation Action IFC   Electrical Stimulation Parameters tolerance   Electrical Stimulation Goals Pain   Manual Therapy   Manual Therapy Myofascial release   Myofascial Release to left SI area and intot he lumbar area and buttock                  PT Short Term Goals - 08/15/15 1537    PT SHORT TERM GOAL #1   Title independent with initial HEP   Status Partially Met           PT Long Term Goals - 08/01/15 1348    PT LONG TERM GOAL #1   Title understand proper posture and body mechanics   Time 6   Period Weeks   Status New   PT LONG TERM GOAL #2   Title decrease pain 50%   Time 6   Period Weeks  Status New   PT LONG TERM GOAL #3   Title increase lumbar side bending 25%   Time 6   Period Weeks   Status New   PT LONG TERM GOAL #4   Title tolerate standing 10 minutes   Time 6   Period Weeks   Status New   PT LONG TERM GOAL #5   Title vacuum without increase of pain   Time 6   Period Weeks   Status New               Plan - 08/22/15 1623    Clinical Impression Statement Continues to have tightness in the LE's, very weak core.  Cues are needed to do exercises correctly   PT Next Visit Plan add exercises as tolerated   Consulted and Agree with Plan of Care Patient        Problem List Patient Active Problem List   Diagnosis Date Noted  . Chronic anticoagulation 07/28/2015  . Family history of colon cancer 07/28/2015  . CN (constipation) 07/28/2015  . Paroxysmal atrial fibrillation 04/04/2015  . Peripheral vascular disease 04/04/2015  . Tobacco abuse 04/04/2015  . Essential hypertension 04/04/2015  . Hyperlipidemia 04/04/2015    Sumner Boast., PT 08/22/2015, 4:25 PM  [address] [place], [place], [address] Phone: [phone]   Fax:  [phone]

## 2015-08-24 ENCOUNTER — Ambulatory Visit: Payer: Medicare HMO | Admitting: Physical Therapy

## 2015-08-24 ENCOUNTER — Encounter: Payer: Self-pay | Admitting: Physical Therapy

## 2015-08-24 DIAGNOSIS — M545 Low back pain, unspecified: Secondary | ICD-10-CM

## 2015-08-24 DIAGNOSIS — M533 Sacrococcygeal disorders, not elsewhere classified: Secondary | ICD-10-CM

## 2015-08-24 DIAGNOSIS — G8929 Other chronic pain: Secondary | ICD-10-CM

## 2015-08-24 DIAGNOSIS — R531 Weakness: Secondary | ICD-10-CM

## 2015-08-24 NOTE — Therapy (Signed)
[address] [place], [place], [address] Phone: [phone]   Fax:  [phone]  Physical Therapy Treatment  Patient Details  Name: Kathleen Griffith MRN: [id number] Date of Birth: February 22, 1941 Referring Provider:  Leamon Arnt, MD  Encounter Date: 08/24/2015      PT End of Session - 08/24/15 1605    Visit Number 5   Number of Visits 12   Authorization Type [employer]   PT Start Time 3716   PT Stop Time 1630   PT Time Calculation (min) 59 min   Activity Tolerance Patient tolerated treatment well   Behavior During Therapy [facility] for tasks assessed/performed      Past Medical History  Diagnosis Date  . Diabetes mellitus   . Hypertension   . Hyperlipidemia   . Atrial fibrillation   . Gastroparesis   . Fibromyalgia   . PMR (polymyalgia rheumatica)   . PVD (peripheral vascular disease)   . Arthritis   . Melanoma   . Colon polyps   . Kidney stones   . Cataract     REMOVED BILATERAL    Past Surgical History  Procedure Laterality Date  . Cesarean section    . Appendectomy    . Breast biopsy    . Colonoscopy    . Polypectomy      There were no vitals filed for this visit.  Visit Diagnosis:  Left-sided low back pain without sciatica  Chronic left SI joint pain  Weakness      Subjective Assessment - 08/24/15 1537    Subjective I was doing great but woke up with pain a 9-10/10, reports that she did vacuum and went to [employer].  She also reports that she lifting a large bag of cat food.   Currently in Pain? Yes   Pain Score 6    Pain Location Back   Pain Orientation Lower   Pain Descriptors / Indicators Aching   Pain Type Chronic pain                         [facility] Adult PT Treatment/Exercise - 08/24/15 0001    Lumbar Exercises: Seated   Other Seated Lumbar Exercises weighted ball lift for lumbar and core strength, weighted ball rotation   Lumbar Exercises: Supine   Clam  20 reps;2 seconds   Large Ball Oblique Isometric 20 reps;2 seconds   Other Supine Lumbar Exercises PROM of the HS and piriformis mms   Other Supine Lumbar Exercises sitting on sit fit pelvic mobility and stability   Knee/Hip Exercises: Aerobic   Nustep Level 5 x 6 minutes   Moist Heat Therapy   Number Minutes Moist Heat 1 Minutes   Moist Heat Location Lumbar Spine   Electrical Stimulation   Electrical Stimulation Location left SI area   Electrical Stimulation Action IFC   Electrical Stimulation Parameters tolerance   Electrical Stimulation Goals Pain   Manual Therapy   Manual Therapy Passive ROM   Passive ROM stretch to HS and piriformis mms                  PT Short Term Goals - 08/15/15 1537    PT SHORT TERM GOAL #1   Title independent with initial HEP   Status Partially Met           PT Long Term Goals - 08/01/15 1348    PT LONG TERM GOAL #1   Title  understand proper posture and body mechanics   Time 6   Period Weeks   Status New   PT LONG TERM GOAL #2   Title decrease pain 50%   Time 6   Period Weeks   Status New   PT LONG TERM GOAL #3   Title increase lumbar side bending 25%   Time 6   Period Weeks   Status New   PT LONG TERM GOAL #4   Title tolerate standing 10 minutes   Time 6   Period Weeks   Status New   PT LONG TERM GOAL #5   Title vacuum without increase of pain   Time 6   Period Weeks   Status New               Plan - 08/24/15 1605    Clinical Impression Statement Core and hips are very weak.  Able to do exercises even with more pain today but was very gaurded with motions   PT Next Visit Plan add exercises as tolerated   Consulted and Agree with Plan of Care Patient        Problem List Patient Active Problem List   Diagnosis Date Noted  . Chronic anticoagulation 07/28/2015  . Family history of colon cancer 07/28/2015  . CN (constipation) 07/28/2015  . Paroxysmal atrial fibrillation 04/04/2015  . Peripheral  vascular disease 04/04/2015  . Tobacco abuse 04/04/2015  . Essential hypertension 04/04/2015  . Hyperlipidemia 04/04/2015    Sumner Boast, PT 08/24/2015, 4:08 PM  [place] [address] [address] [place], [place], [address] Phone: [phone]   Fax:  [phone]

## 2015-08-24 NOTE — Progress Notes (Signed)
HPI: FU atrial fibrillation. Patient apparently has a history of paroxysmal atrial fibrillation. She was treated in [place] previously. She had negative nuclear study approximately 10 years ago. She moved here in July 2015. Echo 5/16 showed normal LV function. 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 Abd ultrasound 5/16 showed no aneurysm. Since last seen,   Current Outpatient Prescriptions  Medication Sig Dispense Refill  . atorvastatin (LIPITOR) 40 MG tablet Take 40 mg by mouth daily.    . busPIRone (BUSPAR) 15 MG tablet Take 15 mg by mouth as needed.    . Cholecalciferol (VITAMIN D3) 1000 UNITS CAPS Take 1 tablet by mouth daily.    . cyclobenzaprine (FLEXERIL) 10 MG tablet   1  . empagliflozin (JARDIANCE) 10 MG TABS tablet Take 25 mg by mouth daily.     Marland Kitchen glimepiride (AMARYL) 4 MG tablet Take 8 mg by mouth daily.    Marland Kitchen HYDROcodone-acetaminophen (NORCO/VICODIN) 5-325 MG per tablet Take 1 tablet by mouth as needed.    Marland Kitchen lisinopril (PRINIVIL,ZESTRIL) 2.5 MG tablet Take 2.5 mg by mouth daily.    . metoprolol tartrate (LOPRESSOR) 25 MG tablet Take 1 tablet by mouth 2 (two) times daily.    Marland Kitchen oxyCODONE-acetaminophen (PERCOCET/ROXICET) 5-325 MG per tablet Take 2 tablets by mouth every 4 (four) hours as needed for severe pain. 15 tablet 0  . rivaroxaban (XARELTO) 20 MG TABS tablet Take 20 mg by mouth daily.    . sertraline (ZOLOFT) 50 MG tablet Take 100 mg by mouth daily.    . vitamin B-12 (CYANOCOBALAMIN) 1000 MCG tablet Take 1,000 mcg by mouth daily.     No current facility-administered medications for this visit.     Past Medical History  Diagnosis Date  . Diabetes mellitus   . Hypertension   . Hyperlipidemia   . Atrial fibrillation   . Gastroparesis   . Fibromyalgia   . PMR (polymyalgia rheumatica)   . PVD (peripheral vascular disease)   . Arthritis   . Melanoma   . Colon polyps   . Kidney stones   . Cataract     REMOVED BILATERAL    Past Surgical  History  Procedure Laterality Date  . Cesarean section    . Appendectomy    . Breast biopsy    . Colonoscopy    . Polypectomy      Social History   Social History  . Marital Status: Married    Spouse Name: N/A  . Number of Children: 1  . Years of Education: N/A   Occupational History  . Not on file.   Social History Main Topics  . Smoking status: Current Every Day Smoker  . Smokeless tobacco: Never Used  . Alcohol Use: No  . Drug Use: Not on file  . Sexual Activity: Not on file   Other Topics Concern  . Not on file   Social History Narrative    ROS: no fevers or chills, productive cough, hemoptysis, dysphasia, odynophagia, melena, hematochezia, dysuria, hematuria, rash, seizure activity, orthopnea, PND, pedal edema, claudication. Remaining systems are negative.  Physical Exam: Well-developed well-nourished in no acute distress.  Skin is warm and dry.  HEENT is normal.  Neck is supple.  Chest is clear to auscultation with normal expansion.  Cardiovascular exam is regular rate and rhythm.  Abdominal exam nontender or distended. No masses palpated. Extremities show no edema. neuro grossly intact  ECG     This encounter was created in error -  please disregard.

## 2015-08-29 ENCOUNTER — Encounter: Payer: Medicare HMO | Admitting: Cardiology

## 2015-08-29 ENCOUNTER — Ambulatory Visit: Payer: Medicare HMO | Admitting: Physical Therapy

## 2015-08-29 ENCOUNTER — Encounter: Payer: Self-pay | Admitting: Physical Therapy

## 2015-08-29 DIAGNOSIS — R531 Weakness: Secondary | ICD-10-CM

## 2015-08-29 DIAGNOSIS — M533 Sacrococcygeal disorders, not elsewhere classified: Secondary | ICD-10-CM

## 2015-08-29 DIAGNOSIS — G8929 Other chronic pain: Secondary | ICD-10-CM

## 2015-08-29 DIAGNOSIS — M545 Low back pain, unspecified: Secondary | ICD-10-CM

## 2015-08-29 NOTE — Therapy (Signed)
[place], [place], [address] Phone: [phone]   Fax:  [phone]  Physical Therapy Treatment  Patient Details  Name: Kathleen Griffith MRN: [id number] Date of Birth: Feb 11, 1941 Referring Provider:  Leamon Arnt, MD  Encounter Date: 08/29/2015      PT End of Session - 08/29/15 1604    Visit Number 6   Number of Visits 12   Authorization Type [employer]   PT Start Time 1527   PT Stop Time 1625   PT Time Calculation (min) 58 min   Activity Tolerance Patient tolerated treatment well   Behavior During Therapy [facility] for tasks assessed/performed      Past Medical History  Diagnosis Date  . Diabetes mellitus   . Hypertension   . Hyperlipidemia   . Atrial fibrillation   . Gastroparesis   . Fibromyalgia   . PMR (polymyalgia rheumatica)   . PVD (peripheral vascular disease)   . Arthritis   . Melanoma   . Colon polyps   . Kidney stones   . Cataract     REMOVED BILATERAL    Past Surgical History  Procedure Laterality Date  . Cesarean section    . Appendectomy    . Breast biopsy    . Colonoscopy    . Polypectomy      There were no vitals filed for this visit.  Visit Diagnosis:  Left-sided low back pain without sciatica  Chronic left SI joint pain  Weakness                       [facility] Adult PT Treatment/Exercise - 08/29/15 0001    Lumbar Exercises: Seated   Other Seated Lumbar Exercises weighted ball lift for lumbar and core strength, weighted ball rotation   Lumbar Exercises: Supine   Clam 20 reps;2 seconds   Other Supine Lumbar Exercises PROM of the HS and piriformis mms, feet on ball K2C, rotation, bridge and isometric abdominals   Other Supine Lumbar Exercises sitting on sit fit pelvic mobility and stability some scapular stabilizaiton with t band    Knee/Hip Exercises: Aerobic   Nustep Level 5 x 6 minutes   Moist Heat Therapy   Number Minutes Moist Heat 15  Minutes   Moist Heat Location Lumbar Spine   Electrical Stimulation   Electrical Stimulation Location left SI area   Electrical Stimulation Action IFC   Electrical Stimulation Parameters tolerance   Electrical Stimulation Goals Pain                  PT Short Term Goals - 08/15/15 1537    PT SHORT TERM GOAL #1   Title independent with initial HEP   Status Partially Met           PT Long Term Goals - 08/29/15 1607    PT LONG TERM GOAL #1   Title understand proper posture and body mechanics   Status Achieved               Plan - 08/29/15 1605    Clinical Impression Statement Weak hips and pain in the hips with exercises to strengthen, she is very tender in the left SI and buttock area   PT Next Visit Plan continue to add exercises for hip and core strength   Consulted and Agree with Plan of Care Patient        Problem List Patient Active Problem List   Diagnosis  Date Noted  . Chronic anticoagulation 07/28/2015  . Family history of colon cancer 07/28/2015  . CN (constipation) 07/28/2015  . Paroxysmal atrial fibrillation 04/04/2015  . Peripheral vascular disease 04/04/2015  . Tobacco abuse 04/04/2015  . Essential hypertension 04/04/2015  . Hyperlipidemia 04/04/2015    Sumner Boast., PT 08/29/2015, 4:08 PM  [address] [place], [place], [address] Phone: [phone]   Fax:  [phone]

## 2015-08-31 ENCOUNTER — Ambulatory Visit: Payer: Medicare HMO | Attending: Family Medicine | Admitting: Physical Therapy

## 2015-08-31 ENCOUNTER — Encounter: Payer: Self-pay | Admitting: Physical Therapy

## 2015-08-31 DIAGNOSIS — R531 Weakness: Secondary | ICD-10-CM | POA: Insufficient documentation

## 2015-08-31 DIAGNOSIS — G8929 Other chronic pain: Secondary | ICD-10-CM | POA: Diagnosis present

## 2015-08-31 DIAGNOSIS — M545 Low back pain, unspecified: Secondary | ICD-10-CM

## 2015-08-31 DIAGNOSIS — M533 Sacrococcygeal disorders, not elsewhere classified: Secondary | ICD-10-CM | POA: Insufficient documentation

## 2015-08-31 NOTE — Therapy (Signed)
[address] [place], [place], [address] Phone: [phone]   Fax:  [phone]  Physical Therapy Treatment  Patient Details  Name: Kathleen Griffith MRN: [id number] Date of Birth: [birth year] Referring Provider:  Leamon Arnt, MD  Encounter Date: 08/31/2015      PT End of Session - 08/31/15 1611    Visit Number 7   PT Start Time 1537   PT Stop Time 1625   PT Time Calculation (min) 48 min   Activity Tolerance Patient tolerated treatment well   Behavior During Therapy [facility] for tasks assessed/performed      Past Medical History  Diagnosis Date  . Diabetes mellitus   . Hypertension   . Hyperlipidemia   . Atrial fibrillation   . Gastroparesis   . Fibromyalgia   . PMR (polymyalgia rheumatica)   . PVD (peripheral vascular disease)   . Arthritis   . Melanoma   . Colon polyps   . Kidney stones   . Cataract     REMOVED BILATERAL    Past Surgical History  Procedure Laterality Date  . Cesarean section    . Appendectomy    . Breast biopsy    . Colonoscopy    . Polypectomy      There were no vitals filed for this visit.  Visit Diagnosis:  Left-sided low back pain without sciatica  Chronic left SI joint pain  Weakness      Subjective Assessment - 08/31/15 1602    Subjective I am feeling a lot better.   Currently in Pain? Yes   Pain Score 3    Pain Location Back   Pain Orientation Lower                         [facility] Adult PT Treatment/Exercise - 08/31/15 0001    Lumbar Exercises: Supine   Clam 20 reps;2 seconds   Large Ball Oblique Isometric 20 reps;2 seconds   Other Supine Lumbar Exercises PROM of the HS and piriformis mms, feet on ball K2C, rotation, bridge and isometric abdominals   Other Supine Lumbar Exercises sitting on sit fit pelvic mobility and stability some scapular stabilizaiton with t band    Moist Heat Therapy   Number Minutes Moist Heat 15 Minutes   Moist Heat Location Lumbar Spine   Electrical Stimulation   Electrical Stimulation Location left SI area   Electrical Stimulation Action IFC   Electrical Stimulation Parameters tolerance   Electrical Stimulation Goals Pain   Manual Therapy   Manual Therapy Passive ROM   Passive ROM stretch to HS and piriformis mms                  PT Short Term Goals - 08/15/15 1537    PT SHORT TERM GOAL #1   Title independent with initial HEP   Status Partially Met           PT Long Term Goals - 08/31/15 1615    PT LONG TERM GOAL #2   Title decrease pain 50%   Status On-going               Plan - 08/31/15 1615    Clinical Impression Statement Feeling better overall, less pain and more mobility   PT Next Visit Plan continue to add exercises for hip and core strength   Consulted and Agree with Plan of Care Patient  Problem List Patient Active Problem List   Diagnosis Date Noted  . Chronic anticoagulation 07/28/2015  . Family history of colon cancer 07/28/2015  . CN (constipation) 07/28/2015  . Paroxysmal atrial fibrillation 04/04/2015  . Peripheral vascular disease 04/04/2015  . Tobacco abuse 04/04/2015  . Essential hypertension 04/04/2015  . Hyperlipidemia 04/04/2015    Sumner Boast., PT 08/31/2015, 4:24 PM  [address] [place] [place], [place], [address] Phone: [phone]   Fax:  [phone]

## 2015-09-05 ENCOUNTER — Ambulatory Visit: Payer: Medicare HMO | Admitting: Physical Therapy

## 2015-09-05 ENCOUNTER — Encounter: Payer: Self-pay | Admitting: Physical Therapy

## 2015-09-05 DIAGNOSIS — M545 Low back pain, unspecified: Secondary | ICD-10-CM

## 2015-09-05 DIAGNOSIS — G8929 Other chronic pain: Secondary | ICD-10-CM

## 2015-09-05 DIAGNOSIS — M533 Sacrococcygeal disorders, not elsewhere classified: Secondary | ICD-10-CM

## 2015-09-05 DIAGNOSIS — R531 Weakness: Secondary | ICD-10-CM

## 2015-09-05 NOTE — Therapy (Signed)
[place] [address] [place], [place], [address] Phone: [phone]   Fax:  [phone]  Physical Therapy Treatment  Patient Details  Name: Kathleen Griffith MRN: [id number] Date of Birth: 10-Jun-1941 Referring Provider:  Leamon Arnt, MD  Encounter Date: 09/05/2015      PT End of Session - 09/05/15 1050    Visit Number 8   Number of Visits 12   Authorization Type [employer]   PT Start Time 2423   PT Stop Time 1103   PT Time Calculation (min) 49 min   Activity Tolerance Patient tolerated treatment well   Behavior During Therapy [facility] for tasks assessed/performed      Past Medical History  Diagnosis Date  . Diabetes mellitus   . Hypertension   . Hyperlipidemia   . Atrial fibrillation   . Gastroparesis   . Fibromyalgia   . PMR (polymyalgia rheumatica)   . PVD (peripheral vascular disease)   . Arthritis   . Melanoma   . Colon polyps   . Kidney stones   . Cataract     REMOVED BILATERAL    Past Surgical History  Procedure Laterality Date  . Cesarean section    . Appendectomy    . Breast biopsy    . Colonoscopy    . Polypectomy      There were no vitals filed for this visit.  Visit Diagnosis:  Left-sided low back pain without sciatica  Chronic left SI joint pain  Weakness      Subjective Assessment - 09/05/15 1047    Subjective Reports that she had a great two days after last treatment, but that the last two days she has been very stiff.   Currently in Pain? Yes   Pain Location Back   Pain Orientation Left;Lower   Pain Descriptors / Indicators Aching;Spasm   Pain Type Chronic pain   Pain Onset More than a month ago   Pain Frequency Constant   Aggravating Factors  activity or inactivity   Pain Relieving Factors treatment                         [facility] Adult PT Treatment/Exercise - 09/05/15 0001    Lumbar Exercises: Supine   Glut Set 10 reps;2 seconds   Clam 20 reps;2  seconds   Large Ball Oblique Isometric 20 reps;2 seconds   Other Supine Lumbar Exercises PROM of the HS and piriformis mms, feet on ball K2C, rotation, bridge and isometric abdominals   Other Supine Lumbar Exercises sitting on sit fit pelvic mobility and stability some scapular stabilizaiton with t band    Moist Heat Therapy   Number Minutes Moist Heat 15 Minutes   Moist Heat Location Lumbar Spine   Electrical Stimulation   Electrical Stimulation Location left SI area   Electrical Stimulation Action IFC   Electrical Stimulation Parameters tolerance   Electrical Stimulation Goals Pain   Manual Therapy   Manual Therapy Soft tissue mobilization   Myofascial Release to left SI area and intot he lumbar area and buttock   Passive ROM stretch to HS and piriformis mms                  PT Short Term Goals - 08/15/15 1537    PT SHORT TERM GOAL #1   Title independent with initial HEP   Status Partially Met           PT Long Term  Goals - 09/05/15 1052    PT LONG TERM GOAL #2   Title decrease pain 50%   Status Partially Met               Plan - 09/05/15 1051    Clinical Impression Statement Very stiff and gaurded, much more difficulty for her with any transitional motions, especially getting in and out of bad, had increased tenderness and spasms in the left lumbar area.   PT Next Visit Plan continue to add exercises for hip and core strength   Consulted and Agree with Plan of Care Patient        Problem List Patient Active Problem List   Diagnosis Date Noted  . Chronic anticoagulation 07/28/2015  . Family history of colon cancer 07/28/2015  . CN (constipation) 07/28/2015  . Paroxysmal atrial fibrillation 04/04/2015  . Peripheral vascular disease 04/04/2015  . Tobacco abuse 04/04/2015  . Essential hypertension 04/04/2015  . Hyperlipidemia 04/04/2015    Sumner Boast., PT 09/05/2015, 10:53 AM  [address] [place], [place], [address] Phone: [phone]   Fax:  [phone]

## 2015-09-07 ENCOUNTER — Encounter: Payer: Self-pay | Admitting: Physical Therapy

## 2015-09-07 ENCOUNTER — Ambulatory Visit: Payer: Medicare HMO | Admitting: Physical Therapy

## 2015-09-07 DIAGNOSIS — M545 Low back pain, unspecified: Secondary | ICD-10-CM

## 2015-09-07 DIAGNOSIS — R531 Weakness: Secondary | ICD-10-CM

## 2015-09-07 DIAGNOSIS — M533 Sacrococcygeal disorders, not elsewhere classified: Secondary | ICD-10-CM

## 2015-09-07 DIAGNOSIS — G8929 Other chronic pain: Secondary | ICD-10-CM

## 2015-09-07 NOTE — Therapy (Signed)
[address] [place], [place], [address] Phone: [phone]   Fax:  [phone]  Physical Therapy Treatment  Patient Details  Name: Kathleen Griffith MRN: [id number] Date of Birth: Mar 21, 1941 Referring Provider:  Leamon Arnt, MD  Encounter Date: 09/07/2015      PT End of Session - 09/07/15 1153    Visit Number 9   Number of Visits 12   Authorization Type [employer]   PT Start Time 1016   PT Stop Time 1117   PT Time Calculation (min) 61 min   Activity Tolerance Patient tolerated treatment well   Behavior During Therapy [facility] for tasks assessed/performed      Past Medical History  Diagnosis Date  . Diabetes mellitus   . Hypertension   . Hyperlipidemia   . Atrial fibrillation   . Gastroparesis   . Fibromyalgia   . PMR (polymyalgia rheumatica)   . PVD (peripheral vascular disease)   . Arthritis   . Melanoma   . Colon polyps   . Kidney stones   . Cataract     REMOVED BILATERAL    Past Surgical History  Procedure Laterality Date  . Cesarean section    . Appendectomy    . Breast biopsy    . Colonoscopy    . Polypectomy      There were no vitals filed for this visit.  Visit Diagnosis:  Left-sided low back pain without sciatica  Chronic left SI joint pain  Weakness      Subjective Assessment - 09/07/15 1150    Subjective Feeling better than a few days ago.     Currently in Pain? Yes   Pain Score 2    Pain Location Back   Pain Orientation Left;Lower   Pain Descriptors / Indicators Aching;Spasm   Pain Type Chronic pain   Pain Onset More than a month ago   Pain Frequency Constant   Aggravating Factors  just doing stuff   Pain Relieving Factors treatment                         [facility] Adult PT Treatment/Exercise - 09/07/15 0001    Lumbar Exercises: Seated   Other Seated Lumbar Exercises weighted ball lift for lumbar and core strength, weighted ball rotation   Lumbar  Exercises: Supine   Clam 20 reps;2 seconds   Large Ball Oblique Isometric 20 reps;2 seconds   Other Supine Lumbar Exercises PROM of the HS and piriformis mms, feet on ball K2C, rotation, bridge and isometric abdominals   Other Supine Lumbar Exercises sitting on sit fit pelvic mobility and stability some scapular stabilizaiton with t band    Knee/Hip Exercises: Aerobic   Nustep Level 5 x 6 minutes   Electrical Stimulation   Electrical Stimulation Location left SI, lumbar area   Electrical Stimulation Action IFC   Electrical Stimulation Parameters tolerance   Electrical Stimulation Goals Pain   Manual Therapy   Manual Therapy Soft tissue mobilization   Myofascial Release to left SI area and intot he lumbar area and buttock   Passive ROM stretch to HS and piriformis mms                  PT Short Term Goals - 09/07/15 1157    PT SHORT TERM GOAL #1   Title independent with initial HEP   Status Achieved           PT Long  Term Goals - 09/07/15 1157    PT LONG TERM GOAL #3   Title increase lumbar side bending 25%   Status Partially Met   PT LONG TERM GOAL #4   Title tolerate standing 10 minutes   Status Partially Met   PT LONG TERM GOAL #5   Title vacuum without increase of pain   Status Partially Met               Plan - 09/07/15 1154    Clinical Impression Statement Very tender in the left buttock and SI area.  Weak core and very tight inth eLE's   PT Next Visit Plan continue to add exercises for hip and core strength   Consulted and Agree with Plan of Care Patient        Problem List Patient Active Problem List   Diagnosis Date Noted  . Chronic anticoagulation 07/28/2015  . Family history of colon cancer 07/28/2015  . CN (constipation) 07/28/2015  . Paroxysmal atrial fibrillation 04/04/2015  . Peripheral vascular disease 04/04/2015  . Tobacco abuse 04/04/2015  . Essential hypertension 04/04/2015  . Hyperlipidemia 04/04/2015     Sumner Boast., PT 09/07/2015, 11:58 AM  [address] [place], [place], [address] Phone: [phone]   Fax:  [phone]

## 2015-09-12 ENCOUNTER — Ambulatory Visit: Payer: Medicare HMO | Admitting: Physical Therapy

## 2015-09-14 ENCOUNTER — Ambulatory Visit: Payer: Medicare HMO | Admitting: Physical Therapy

## 2015-09-14 ENCOUNTER — Encounter: Payer: Self-pay | Admitting: Physical Therapy

## 2015-09-14 DIAGNOSIS — M545 Low back pain, unspecified: Secondary | ICD-10-CM

## 2015-09-14 DIAGNOSIS — G8929 Other chronic pain: Secondary | ICD-10-CM

## 2015-09-14 DIAGNOSIS — R531 Weakness: Secondary | ICD-10-CM

## 2015-09-14 DIAGNOSIS — M533 Sacrococcygeal disorders, not elsewhere classified: Secondary | ICD-10-CM

## 2015-09-14 NOTE — Therapy (Signed)
[address] [place], [place], [address] Phone: [phone]   Fax:  [phone]  Physical Therapy Treatment  Patient Details  Name: Kathleen Griffith MRN: [id number] Date of Birth: 10-Jun-1941 Referring Provider:  Leamon Arnt, MD  Encounter Date: 09/14/2015      PT End of Session - 09/14/15 1601    Visit Number 10   Number of Visits 12   Authorization Type [employer]   PT Start Time 9622   PT Stop Time 1618   PT Time Calculation (min) 40 min   Activity Tolerance Patient tolerated treatment well   Behavior During Therapy [facility] for tasks assessed/performed      Past Medical History  Diagnosis Date  . Diabetes mellitus   . Hypertension   . Hyperlipidemia   . Atrial fibrillation   . Gastroparesis   . Fibromyalgia   . PMR (polymyalgia rheumatica)   . PVD (peripheral vascular disease)   . Arthritis   . Melanoma   . Colon polyps   . Kidney stones   . Cataract     REMOVED BILATERAL    Past Surgical History  Procedure Laterality Date  . Cesarean section    . Appendectomy    . Breast biopsy    . Colonoscopy    . Polypectomy      There were no vitals filed for this visit.  Visit Diagnosis:  Left-sided low back pain without sciatica  Chronic left SI joint pain  Weakness      Subjective Assessment - 09/14/15 1559    Subjective just feeling bad all over, had to cancel earlier in weak due to sinus problems.  "my back is feeling better"   Currently in Pain? Yes   Pain Score 1    Pain Location Back   Pain Orientation Left;Lower   Pain Descriptors / Indicators Aching   Pain Type Chronic pain   Aggravating Factors  housework   Pain Relieving Factors this treatment                         [facility] Adult PT Treatment/Exercise - 09/14/15 0001    Lumbar Exercises: Supine   Other Supine Lumbar Exercises PROM of the HS and piriformis mms, feet on ball K2C, rotation, bridge and  isometric abdominals   Electrical Stimulation   Electrical Stimulation Location left SI, lumbar area   Electrical Stimulation Action IFC   Electrical Stimulation Parameters tolerance   Electrical Stimulation Goals Pain   Manual Therapy   Manual Therapy Soft tissue mobilization   Myofascial Release to left SI area and intot he lumbar area and buttock   Passive ROM stretch to HS and piriformis mms                  PT Short Term Goals - 09/07/15 1157    PT SHORT TERM GOAL #1   Title independent with initial HEP   Status Achieved           PT Long Term Goals - 09/14/15 1621    PT LONG TERM GOAL #2   Title decrease pain 50%   Status Partially Met   PT LONG TERM GOAL #3   Title increase lumbar side bending 25%   Status Achieved               Plan - 09/14/15 1613    Clinical Impression Statement She remains tender in the left buttock  and low back.  She reports that she was able to help her husband up from the floor the other day with a little bit of increased pain for a while but it went away, she reports that she feels stronger   PT Next Visit Plan next two visits assure independent HEP and body mechanics   Consulted and Agree with Plan of Care Patient        Problem List Patient Active Problem List   Diagnosis Date Noted  . Chronic anticoagulation 07/28/2015  . Family history of colon cancer 07/28/2015  . CN (constipation) 07/28/2015  . Paroxysmal atrial fibrillation 04/04/2015  . Peripheral vascular disease 04/04/2015  . Tobacco abuse 04/04/2015  . Essential hypertension 04/04/2015  . Hyperlipidemia 04/04/2015    Sumner Boast., PT 09/14/2015, 4:22 PM  [address] [place], [place], [address] Phone: [phone]   Fax:  [phone]

## 2015-09-19 ENCOUNTER — Ambulatory Visit: Payer: Medicare HMO | Admitting: Physical Therapy

## 2015-09-21 ENCOUNTER — Ambulatory Visit: Payer: Medicare HMO | Admitting: Physical Therapy

## 2015-09-21 ENCOUNTER — Encounter: Payer: Self-pay | Admitting: Physical Therapy

## 2015-09-21 DIAGNOSIS — M545 Low back pain, unspecified: Secondary | ICD-10-CM

## 2015-09-21 DIAGNOSIS — G8929 Other chronic pain: Secondary | ICD-10-CM

## 2015-09-21 DIAGNOSIS — R531 Weakness: Secondary | ICD-10-CM

## 2015-09-21 DIAGNOSIS — M533 Sacrococcygeal disorders, not elsewhere classified: Secondary | ICD-10-CM

## 2015-09-21 NOTE — Therapy (Signed)
[address] [place], [place], [address] Phone: [phone]   Fax:  [phone]  Physical Therapy Treatment  Patient Details  Name: Kathleen Griffith MRN: [id number] Date of Birth: 1941-05-06 Referring Provider:  Leamon Arnt, MD  Encounter Date: 09/21/2015      PT End of Session - 09/21/15 1559    Visit Number 11   Number of Visits 12   Authorization Type [employer]   PT Start Time 1519   PT Stop Time 1620   PT Time Calculation (min) 61 min   Activity Tolerance Patient tolerated treatment well   Behavior During Therapy [facility] for tasks assessed/performed      Past Medical History  Diagnosis Date  . Diabetes mellitus   . Hypertension   . Hyperlipidemia   . Atrial fibrillation   . Gastroparesis   . Fibromyalgia   . PMR (polymyalgia rheumatica)   . PVD (peripheral vascular disease)   . Arthritis   . Melanoma   . Colon polyps   . Kidney stones   . Cataract     REMOVED BILATERAL    Past Surgical History  Procedure Laterality Date  . Cesarean section    . Appendectomy    . Breast biopsy    . Colonoscopy    . Polypectomy      There were no vitals filed for this visit.  Visit Diagnosis:  Left-sided low back pain without sciatica  Chronic left SI joint pain  Weakness      Subjective Assessment - 09/21/15 1536    Subjective I am feeling better today. less back pain   Currently in Pain? Yes   Pain Score 1    Pain Location Back   Pain Orientation Left;Lower   Pain Type Chronic pain   Pain Onset More than a month ago   Pain Frequency Constant   Aggravating Factors  bending and cleaning   Pain Relieving Factors rest and heat                         [facility] Adult PT Treatment/Exercise - 09/21/15 0001    Therapeutic Activites    Therapeutic Activities ADL's;Lifting   ADL's went over vacuuming, housework and proper body mechanics with that   Lifting went over golfer's  lift, laundry, groceries   Lumbar Exercises: Supine   Clam 20 reps;2 seconds   Large Ball Oblique Isometric 20 reps;2 seconds   Other Supine Lumbar Exercises PROM of the HS and piriformis mms, feet on ball K2C, rotation, bridge and isometric abdominals   Knee/Hip Exercises: Aerobic   Nustep Level 5 x 6 minutes   Moist Heat Therapy   Number Minutes Moist Heat 15 Minutes   Moist Heat Location Lumbar Spine   Electrical Stimulation   Electrical Stimulation Location left SI, lumbar area   Electrical Stimulation Action IFC   Electrical Stimulation Parameters tolerance   Electrical Stimulation Goals Pain                PT Education - 09/21/15 1558    Education provided Yes   Education Details went over an HEP for her to do with wms flexion exercises and LE stretches   Person(s) Educated Patient   Methods Explanation;Demonstration   Comprehension Verbalized understanding          PT Short Term Goals - 09/07/15 1157    PT SHORT TERM GOAL #1   Title independent with  initial HEP   Status Achieved           PT Long Term Goals - 09/21/15 1601    PT LONG TERM GOAL #2   Title decrease pain 50%   Status Achieved   PT LONG TERM GOAL #4   Title tolerate standing 10 minutes   Status Achieved               Plan - 09/21/15 1559    Clinical Impression Statement Overall doing well, can verbalize understanding of the body mechanics that we went over, needs further instruction in the HEP as she has some questions   PT Next Visit Plan she is to take pix of the gym equipment that she has access to  so the next visit we can assure her independence and safety   Consulted and Agree with Plan of Care Patient        Problem List Patient Active Problem List   Diagnosis Date Noted  . Chronic anticoagulation 07/28/2015  . Family history of colon cancer 07/28/2015  . CN (constipation) 07/28/2015  . Paroxysmal atrial fibrillation 04/04/2015  . Peripheral vascular disease  04/04/2015  . Tobacco abuse 04/04/2015  . Essential hypertension 04/04/2015  . Hyperlipidemia 04/04/2015    Sumner Boast., PT 09/21/2015, 4:02 PM  [address] [place], [place], [address] Phone: [phone]   Fax:  [phone]

## 2015-09-26 ENCOUNTER — Ambulatory Visit: Payer: Medicare HMO | Admitting: Physical Therapy

## 2015-09-28 ENCOUNTER — Ambulatory Visit: Payer: Medicare HMO | Admitting: Physical Therapy

## 2015-10-04 ENCOUNTER — Ambulatory Visit: Payer: Medicare HMO | Attending: Family Medicine | Admitting: Physical Therapy

## 2015-10-04 ENCOUNTER — Encounter: Payer: Self-pay | Admitting: Physical Therapy

## 2015-10-04 DIAGNOSIS — R531 Weakness: Secondary | ICD-10-CM | POA: Insufficient documentation

## 2015-10-04 DIAGNOSIS — M545 Low back pain, unspecified: Secondary | ICD-10-CM

## 2015-10-04 DIAGNOSIS — G8929 Other chronic pain: Secondary | ICD-10-CM | POA: Insufficient documentation

## 2015-10-04 DIAGNOSIS — M533 Sacrococcygeal disorders, not elsewhere classified: Secondary | ICD-10-CM | POA: Insufficient documentation
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
## 2015-10-16 ENCOUNTER — Ambulatory Visit: Payer: Medicare HMO | Admitting: Cardiology

## 2015-11-03 ENCOUNTER — Other Ambulatory Visit: Payer: Self-pay | Admitting: Cardiology

## 2015-11-03 DIAGNOSIS — R0989 Other specified symptoms and signs involving the circulatory and respiratory systems: Secondary | ICD-10-CM

## 2015-11-09 ENCOUNTER — Ambulatory Visit (HOSPITAL_COMMUNITY)
Admission: RE | Admit: 2015-11-09 | Discharge: 2015-11-09 | Disposition: A | Discharge status: Home / Self Care | Payer: Medicare HMO | Source: Ambulatory Visit | Attending: Cardiology | Admitting: Cardiology

## 2015-11-09 ENCOUNTER — Ambulatory Visit (HOSPITAL_BASED_OUTPATIENT_CLINIC_OR_DEPARTMENT_OTHER)
Admission: RE | Admit: 2015-11-09 | Discharge: 2015-11-09 | Disposition: A | Discharge status: Home / Self Care | Payer: Medicare HMO | Source: Ambulatory Visit | Attending: Family Medicine | Admitting: Family Medicine

## 2015-11-09 ENCOUNTER — Other Ambulatory Visit (HOSPITAL_COMMUNITY): Payer: Self-pay | Admitting: Family Medicine

## 2015-11-09 DIAGNOSIS — R0989 Other specified symptoms and signs involving the circulatory and respiratory systems: Secondary | ICD-10-CM

## 2015-11-09 DIAGNOSIS — I739 Peripheral vascular disease, unspecified: Secondary | ICD-10-CM | POA: Insufficient documentation

## 2015-11-09 DIAGNOSIS — M5430 Sciatica, unspecified side: Secondary | ICD-10-CM | POA: Diagnosis not present

## 2015-11-09 DIAGNOSIS — I679 Cerebrovascular disease, unspecified: Secondary | ICD-10-CM | POA: Insufficient documentation

## 2015-11-09 DIAGNOSIS — I10 Essential (primary) hypertension: Secondary | ICD-10-CM | POA: Diagnosis not present

## 2015-11-09 DIAGNOSIS — E119 Type 2 diabetes mellitus without complications: Secondary | ICD-10-CM | POA: Diagnosis not present

## 2015-11-09 DIAGNOSIS — E785 Hyperlipidemia, unspecified: Secondary | ICD-10-CM | POA: Diagnosis not present

## 2015-11-20 NOTE — Progress Notes (Signed)
HPI: FU atrial fibrillation. She has a history of paroxysmal atrial fibrillation. She was treated in [place] previously. She had negative nuclear study approximately 10 years ago. She moved here in July 2015. Abdominal ultrasound May 2016 showed no aneurysm. Echocardiogram May 2016 showed normal LV function. Mild left ventricular hypertrophy. 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 ABIs November 2016 normal. Since last seen, She has some dyspnea on exertion but no orthopnea, PND, pedal edema, chest pain or syncope. She has not had recurrent atrial fibrillation. She has some pain in her legs with ambulation.  Current Outpatient Prescriptions  Medication Sig Dispense Refill  . acetaminophen (TYLENOL) 500 MG tablet Take 500 mg by mouth every 6 (six) hours as needed.    Marland Kitchen atorvastatin (LIPITOR) 40 MG tablet Take 40 mg by mouth daily.    . busPIRone (BUSPAR) 15 MG tablet Take 15 mg by mouth as needed.    . Cholecalciferol (VITAMIN D3) 1000 UNITS CAPS Take 1 tablet by mouth daily.    . empagliflozin (JARDIANCE) 25 MG TABS tablet Take 25 mg by mouth daily.    Marland Kitchen glimepiride (AMARYL) 4 MG tablet Take 8 mg by mouth daily.    Marland Kitchen HYDROcodone-acetaminophen (NORCO/VICODIN) 5-325 MG per tablet Take 1 tablet by mouth as needed.    . insulin glargine (LANTUS) 100 UNIT/ML injection Inject 15 Units into the skin at bedtime.    . metoprolol tartrate (LOPRESSOR) 25 MG tablet Take 1 tablet by mouth 2 (two) times daily. 1 TAB IN THE AM AND 1/2 TAB IN THE PM    . Multiple Vitamins-Minerals (CENTRUM SILVER PO) Take 1 tablet by mouth daily.    . rivaroxaban (XARELTO) 20 MG TABS tablet Take 20 mg by mouth daily.    . sertraline (ZOLOFT) 50 MG tablet Take 100 mg by mouth daily.    . vitamin B-12 (CYANOCOBALAMIN) 1000 MCG tablet Take 1,000 mcg by mouth daily.     No current facility-administered medications for this visit.     Past Medical History  Diagnosis Date  . Diabetes mellitus ([place])   .  Hypertension   . Hyperlipidemia   . Atrial fibrillation ([place])   . Gastroparesis   . Fibromyalgia   . PMR (polymyalgia rheumatica) (HCC)   . PVD (peripheral vascular disease) ([place])   . Arthritis   . Melanoma ([place])   . Colon polyps   . Kidney stones   . Cataract     REMOVED BILATERAL    Past Surgical History  Procedure Laterality Date  . Cesarean section    . Appendectomy    . Breast biopsy    . Colonoscopy    . Polypectomy      Social History   Social History  . Marital Status: Married    Spouse Name: N/A  . Number of Children: 1  . Years of Education: N/A   Occupational History  . Not on file.   Social History Main Topics  . Smoking status: Current Every Day Smoker -- 1.00 packs/day    Types: Cigarettes  . Smokeless tobacco: Never Used  . Alcohol Use: No  . Drug Use: Not on file  . Sexual Activity: Not on file   Other Topics Concern  . Not on file   Social History Narrative    ROS: Back and hip pain but no fevers or chills, productive cough, hemoptysis, dysphasia, odynophagia, melena, hematochezia, dysuria, hematuria, rash, seizure activity, orthopnea, PND, pedal edema. Remaining systems are negative.  Physical Exam: Well-developed well-nourished in no acute distress.  Skin is warm and dry.  HEENT is normal.  Neck is supple. Bilateral bruits Chest is clear to auscultation with normal expansion.  Cardiovascular exam is regular rate and rhythm. 2/6 systolic murmur left sternal border. Abdominal exam nontender or distended. No masses palpated. Extremities show no edema. neuro grossly intact  ECG Sinus rhythm at a rate of 65. Occasional fusion complexes. Cannot rule out prior septal infarct.

## 2015-11-27 ENCOUNTER — Encounter: Payer: Self-pay | Admitting: Cardiology

## 2015-11-27 ENCOUNTER — Ambulatory Visit (INDEPENDENT_AMBULATORY_CARE_PROVIDER_SITE_OTHER): Payer: Medicare HMO | Admitting: Cardiology

## 2015-11-27 VITALS — BP 146/70 | HR 65 | Ht 63.5 in | Wt 158.7 lb

## 2015-11-27 DIAGNOSIS — I679 Cerebrovascular disease, unspecified: Secondary | ICD-10-CM | POA: Insufficient documentation

## 2015-11-27 DIAGNOSIS — Z720 Tobacco use: Secondary | ICD-10-CM

## 2015-11-27 DIAGNOSIS — I480 Paroxysmal atrial fibrillation: Secondary | ICD-10-CM

## 2015-11-27 DIAGNOSIS — I10 Essential (primary) hypertension: Secondary | ICD-10-CM

## 2015-11-27 DIAGNOSIS — I739 Peripheral vascular disease, unspecified: Secondary | ICD-10-CM | POA: Diagnosis not present

## 2015-11-27 DIAGNOSIS — E785 Hyperlipidemia, unspecified: Secondary | ICD-10-CM | POA: Diagnosis not present

## 2015-11-27 LAB — BASIC METABOLIC PANEL
BUN: 29 mg/dL — AB (ref 7–25)
CHLORIDE: 101 mmol/L (ref 98–110)
CO2: 23 mmol/L (ref 20–31)
Calcium: 9.2 mg/dL (ref 8.6–10.4)
Creat: 1.01 mg/dL — ABNORMAL HIGH (ref 0.60–0.93)
Glucose, Bld: 180 mg/dL — ABNORMAL HIGH (ref 65–99)
POTASSIUM: 4.8 mmol/L (ref 3.5–5.3)
Sodium: 136 mmol/L (ref 135–146)

## 2015-11-27 MED ORDER — METOPROLOL TARTRATE 25 MG PO TABS: 25.0000 mg | ORAL_TABLET | Freq: Two times a day (BID) | ORAL | Status: DC

## 2015-11-27 NOTE — Assessment & Plan Note (Signed)
Plan repeat carotid Dopplers May 2017.

## 2015-11-27 NOTE — Assessment & Plan Note (Signed)
Continue statin. 

## 2015-11-27 NOTE — Assessment & Plan Note (Signed)
Blood pressure controlled. Continue present medications. 

## 2015-11-27 NOTE — Patient Instructions (Signed)
Medication Instructions:   INCREASE METOPROLOL TO 25 MG TWICE DAILY= 1 TABLET TWICE DAILY  Labwork:  Your physician recommends that you HAVE LAB WORK TODAY  Follow-Up:  Your physician wants you to follow-up in: [place] will receive a reminder letter in the mail two months in advance. If you don't receive a letter, please call our office to schedule the follow-up appointment.    If you need a refill on your cardiac medications before your next appointment, please call your pharmacy.

## 2015-11-27 NOTE — Assessment & Plan Note (Signed)
Patient counseled on discontinuing. 

## 2015-11-27 NOTE — Assessment & Plan Note (Addendum)
Expand All Collapse All   Patient Remains in sinus rhythm. Continue metoprolol But increase to 25 mg twice a day. She has embolic risk factors of female sex, age greater than 60, diabetes mellitus and hypertension. CHADS vasc 4. Continue xarelto. Check hemoglobin and renal function. If she has more frequent episodes in the future we will consider an antiarrhythmic. I would most likely choose tikosyn.

## 2015-11-28 LAB — CBC
HCT: 30.7 % — ABNORMAL LOW (ref 36.0–46.0)
Hemoglobin: 9 g/dL — ABNORMAL LOW (ref 12.0–15.0)
MCH: 20.8 pg — ABNORMAL LOW (ref 26.0–34.0)
MCHC: 29.3 g/dL — ABNORMAL LOW (ref 30.0–36.0)
MCV: 71.1 fL — ABNORMAL LOW (ref 78.0–100.0)
MPV: 9.2 fL (ref 8.6–12.4)
Platelets: 598 10*3/uL — ABNORMAL HIGH (ref 150–400)
RBC: 4.32 MIL/uL (ref 3.87–5.11)
RDW: 16.8 % — AB (ref 11.5–15.5)
WBC: 14.6 10*3/uL — AB (ref 4.0–10.5)

## 2015-12-14 ENCOUNTER — Ambulatory Visit: Payer: Medicare HMO | Admitting: Gastroenterology

## 2015-12-15 ENCOUNTER — Encounter: Payer: Self-pay | Admitting: Gastroenterology

## 2015-12-15 ENCOUNTER — Telehealth: Payer: Self-pay

## 2015-12-15 ENCOUNTER — Other Ambulatory Visit (INDEPENDENT_AMBULATORY_CARE_PROVIDER_SITE_OTHER): Payer: Medicare HMO

## 2015-12-15 ENCOUNTER — Ambulatory Visit (INDEPENDENT_AMBULATORY_CARE_PROVIDER_SITE_OTHER): Payer: Medicare HMO | Admitting: Gastroenterology

## 2015-12-15 VITALS — BP 130/68 | HR 56 | Ht 63.0 in | Wt 164.2 lb

## 2015-12-15 DIAGNOSIS — D649 Anemia, unspecified: Secondary | ICD-10-CM

## 2015-12-15 DIAGNOSIS — Z7901 Long term (current) use of anticoagulants: Secondary | ICD-10-CM

## 2015-12-15 DIAGNOSIS — I480 Paroxysmal atrial fibrillation: Secondary | ICD-10-CM | POA: Diagnosis not present

## 2015-12-15 LAB — CBC WITH DIFFERENTIAL/PLATELET
BASOS ABS: 0.1 10*3/uL (ref 0.0–0.1)
Basophils Relative: 1.1 % (ref 0.0–3.0)
EOS PCT: 2.8 % (ref 0.0–5.0)
Eosinophils Absolute: 0.3 10*3/uL (ref 0.0–0.7)
HCT: 36.2 % (ref 36.0–46.0)
HEMOGLOBIN: 11.3 g/dL — AB (ref 12.0–15.0)
LYMPHS ABS: 1.9 10*3/uL (ref 0.7–4.0)
Lymphocytes Relative: 20.1 % (ref 12.0–46.0)
MCHC: 31.1 g/dL (ref 30.0–36.0)
MCV: 72.6 fl — AB (ref 78.0–100.0)
MONO ABS: 0.6 10*3/uL (ref 0.1–1.0)
MONOS PCT: 6.8 % (ref 3.0–12.0)
NEUTROS PCT: 69.2 % (ref 43.0–77.0)
Neutro Abs: 6.5 10*3/uL (ref 1.4–7.7)
Platelets: 336 10*3/uL (ref 150.0–400.0)
RBC: 4.99 Mil/uL (ref 3.87–5.11)
RDW: 21.7 % — ABNORMAL HIGH (ref 11.5–15.5)
WBC: 9.4 10*3/uL (ref 4.0–10.5)

## 2015-12-15 MED ORDER — NA SULFATE-K SULFATE-MG SULF 17.5-3.13-1.6 GM/177ML PO SOLN: ORAL | Status: DC

## 2015-12-15 NOTE — Progress Notes (Signed)
HPI :  74 y/o female here for evaluation of new onset microcytic anemia in the setting of Xarelto use for atrial fibrillation. She reports she has felt poorly for the past few months, she felt fatigued and lightheadedness. She has also been craving ice. She had a Hgb of 7.5 noted on 12/11/15, this was down from a Hgb of 9s a few weeks previously.She has not had anemia like this previously. She had 2 units of PRBC transfusion this past Wed, Hgb from 7.5 to 11s. She has been taking an iron supplement twice daily.  Since her transfusion she has felt considerably better.  She reports she has had a hard time moving her bowels, with some constipation, and has been taking a fiber supplement which has helped a little bit. She has tried miralax but it causes cramping and doesn't want to take it. She has not been seeing any blood in the stools. She had a negative hemeoccult. No abdominal pains. She is eating okay. She is not vomiting or having postprandial pains. She had a prior remote EGD, she thinks she had some gastritis. She has previously had a lot of reflux / GERD. She has been taking 20mg  per day of omeprazole which has been helping her. She has been taking Xarelto for a history of atrial fibrillation. She has had this held since November 28th. She doesn't take any other NSAIDs.   Her mother had colon cancer at age 85.  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 Diabetes mellitus ([place])   . Hypertension   . Hyperlipidemia   . Atrial fibrillation ([place])   . Gastroparesis   . Fibromyalgia   . PMR (polymyalgia rheumatica) ([facility])   . PVD (peripheral vascular disease) ([place])   . Arthritis   . Melanoma ([place])   . Colon polyps   . Kidney stones   . Cataract     REMOVED BILATERAL  . Anemia     microcytic      Past Surgical History  Procedure Laterality Date  . Cesarean section    . Appendectomy    . Breast biopsy    . Colonoscopy    .  Polypectomy     Family History  Problem Relation Age of Onset  . CAD Father     MI and CABG  . Diabetes Father   . Heart disease Father   . Colon cancer Mother 86   Social History  Substance Use Topics  . Smoking status: Current Every Day Smoker -- 1.00 packs/day    Types: Cigarettes  . Smokeless tobacco: Never Used  . Alcohol Use: No   Current Outpatient Prescriptions  Medication Sig Dispense Refill  . acetaminophen (TYLENOL) 500 MG tablet Take 500 mg by mouth every 6 (six) hours as needed.    Marland Kitchen atorvastatin (LIPITOR) 40 MG tablet Take 40 mg by mouth daily.    . busPIRone (BUSPAR) 15 MG tablet Take 15 mg by mouth as needed.    . Cholecalciferol (VITAMIN D3) 1000 UNITS CAPS Take 1 tablet by mouth daily.    . empagliflozin (JARDIANCE) 25 MG TABS tablet Take 25 mg by mouth daily.    Marland Kitchen gabapentin (NEURONTIN) 300 MG capsule Take 300 mg by mouth 2 (two) times daily.    Marland Kitchen glimepiride (AMARYL) 4 MG tablet Take 8 mg by mouth daily.    Marland Kitchen HYDROcodone-acetaminophen (NORCO/VICODIN) 5-325 MG per tablet Take 1 tablet by mouth  as needed.    . insulin glargine (LANTUS) 100 UNIT/ML injection Inject 15 Units into the skin at bedtime.    . metoprolol tartrate (LOPRESSOR) 25 MG tablet Take 1 tablet (25 mg total) by mouth 2 (two) times daily. 180 tablet 3  . Multiple Vitamins-Minerals (CENTRUM SILVER PO) Take 1 tablet by mouth daily.    Marland Kitchen omeprazole (PRILOSEC) 20 MG capsule Take 20 mg by mouth daily.    . sertraline (ZOLOFT) 50 MG tablet Take 100 mg by mouth daily.    . vitamin B-12 (CYANOCOBALAMIN) 1000 MCG tablet Take 1,000 mcg by mouth daily.    . Na Sulfate-K Sulfate-Mg Sulf SOLN Take as directed per Colonoscopy. 354 mL 0  . rivaroxaban (XARELTO) 20 MG TABS tablet Take 20 mg by mouth daily. Reported on 12/15/2015     No current facility-administered medications for this visit.   Allergies  Allergen Reactions  . Metoclopramide Other (See Comments)    Tardive dyskinesia  . Gabapentin Nausea  And Vomiting    WEIGHT GAIN/FELT BAD  . Lyrica [Pregabalin] Swelling    TO LOWER EXTREMTIES     Review of Systems: All systems reviewed and negative except where noted in HPI.   Lab Results  Component Value Date   WBC 9.4 12/15/2015   HGB 11.3* 12/15/2015   HCT 36.2 12/15/2015   MCV 72.6* 12/15/2015   PLT 336.0 12/15/2015    Lab Results  Component Value Date   CREATININE 1.01* 11/27/2015   BUN 29* 11/27/2015   NA 136 11/27/2015   K 4.8 11/27/2015   CL 101 11/27/2015   CO2 23 11/27/2015    No results found for: ALT, AST, GGT, ALKPHOS, BILITOT   Physical Exam: BP 130/68 mmHg  Pulse 56  Ht 5\' 3"  (1.6 m)  Wt 164 lb 3.2 oz (74.481 kg)  BMI 29.09 kg/m2 Constitutional: Pleasant,well-developed, female in no acute distress. HEENT: Normocephalic and atraumatic. Conjunctivae are slightly pale. No scleral icterus. Neck supple.  Cardiovascular: Normal rate, regular rhythm. 2/6 SEM Pulmonary/chest: Effort normal and breath sounds normal. No wheezing, rales or rhonchi. Abdominal: Soft, nondistended, nontender. Bowel sounds active throughout. There are no masses palpable. No hepatomegaly. Extremities: no edema Lymphadenopathy: No cervical adenopathy noted. Neurological: Alert and oriented to person place and time. Skin: Skin is warm and dry. No rashes noted. Psychiatric: Normal mood and affect. Behavior is normal.   ASSESSMENT AND PLAN: 74 y/o female with history of atrial fibrillation on Xarelto who presents with a progressive microcytic anemia in recent weeks. Hgb went from 9 two weeks ago, to 7s earlier this week, now s/p 2 units PRBC transfusion and Hgb has intervally increased to 11s, and she has responded appropriately. She has not had any overt bleeding. She has had her Xarelto held and placed on oral iron, and is on omeprazole.   I suspect she has an iron deficiency given new onset microcytosis however iron studies were not obtained and if drawn now will not be accurate  following recent transfusion. I discussed differential for iron deficiency with her. The last colonoscopy she had earlier this year showed a 1cm cecal AVM, and this certainly could be the culprit and oozing slowly in the setting of Xarelto use. It is also possible she has other AVMs of the small bowel, or other upper tract pathology. I am recommending EGD and colonoscopy to be done as soon as possible to further evaluate this issue, and will be done in the near future. These procedures need to  be done at [place] given to have APC available to ablate the cecal AVM, pending no other significant pathology is found. If she has any interval overt blood loss or worsening anemia to cause symptoms again, she may need to be admitted to have this done sooner. She will continue to hold Xarelto and avoid all NSAIDs, and continue oral iron. She understands the risks of withholding anticoagulation otherwise at this time, however given the severity of her anemia it is warranted until this issue is addressed.   The indications, risks, and benefits of EGD and colonoscopy were explained to the patient in detail. Risks include but are not limited to bleeding, perforation, adverse reaction to medications, and cardiopulmonary compromise. Sequelae include but are not limited to the possibility of surgery, hositalization, and mortality. The patient verbalized understanding and wished to proceed. All questions answered, referred to scheduler and bowel prep ordered. Further recommendations pending results of the exam.   Scioto Cellar, MD [facility] Pager [phone]  CC: Leamon Arnt, MD

## 2015-12-15 NOTE — Patient Instructions (Signed)
You have been scheduled for an endoscopy and colonoscopy. Please follow the written instructions given to you at your visit today. Please pick up your prep supplies at the pharmacy within the next 1-3 days. If you use inhalers (even only as needed), please bring them with you on the day of your procedure.  We have sent the following medications to your pharmacy for you to pick up at your convenience: Suprep

## 2015-12-18 ENCOUNTER — Encounter (HOSPITAL_COMMUNITY): Payer: Self-pay | Admitting: *Deleted

## 2015-12-26 ENCOUNTER — Ambulatory Visit (HOSPITAL_COMMUNITY)
Admission: RE | Admit: 2015-12-26 | Discharge: 2015-12-26 | Disposition: A | Discharge status: Home / Self Care | Payer: Medicare HMO | Source: Ambulatory Visit | Attending: Gastroenterology | Admitting: Gastroenterology

## 2015-12-26 ENCOUNTER — Ambulatory Visit (HOSPITAL_COMMUNITY): Payer: Medicare HMO | Admitting: Anesthesiology

## 2015-12-26 ENCOUNTER — Encounter (HOSPITAL_COMMUNITY): Payer: Self-pay

## 2015-12-26 ENCOUNTER — Encounter (HOSPITAL_COMMUNITY)
Admission: RE | Disposition: A | Discharge status: Home / Self Care | Payer: Self-pay | Source: Ambulatory Visit | Attending: Gastroenterology

## 2015-12-26 DIAGNOSIS — D125 Benign neoplasm of sigmoid colon: Secondary | ICD-10-CM | POA: Diagnosis not present

## 2015-12-26 DIAGNOSIS — R1310 Dysphagia, unspecified: Secondary | ICD-10-CM | POA: Insufficient documentation

## 2015-12-26 DIAGNOSIS — Z79899 Other long term (current) drug therapy: Secondary | ICD-10-CM | POA: Insufficient documentation

## 2015-12-26 DIAGNOSIS — Z800 Family history of malignant neoplasm of digestive organs: Secondary | ICD-10-CM | POA: Insufficient documentation

## 2015-12-26 DIAGNOSIS — R1013 Epigastric pain: Secondary | ICD-10-CM | POA: Insufficient documentation

## 2015-12-26 DIAGNOSIS — K5520 Angiodysplasia of colon without hemorrhage: Secondary | ICD-10-CM | POA: Diagnosis not present

## 2015-12-26 DIAGNOSIS — M797 Fibromyalgia: Secondary | ICD-10-CM | POA: Insufficient documentation

## 2015-12-26 DIAGNOSIS — K31819 Angiodysplasia of stomach and duodenum without bleeding: Secondary | ICD-10-CM | POA: Insufficient documentation

## 2015-12-26 DIAGNOSIS — K317 Polyp of stomach and duodenum: Secondary | ICD-10-CM | POA: Diagnosis not present

## 2015-12-26 DIAGNOSIS — Z794 Long term (current) use of insulin: Secondary | ICD-10-CM | POA: Insufficient documentation

## 2015-12-26 DIAGNOSIS — K5900 Constipation, unspecified: Secondary | ICD-10-CM | POA: Insufficient documentation

## 2015-12-26 DIAGNOSIS — N289 Disorder of kidney and ureter, unspecified: Secondary | ICD-10-CM | POA: Insufficient documentation

## 2015-12-26 DIAGNOSIS — E785 Hyperlipidemia, unspecified: Secondary | ICD-10-CM | POA: Diagnosis not present

## 2015-12-26 DIAGNOSIS — K219 Gastro-esophageal reflux disease without esophagitis: Secondary | ICD-10-CM | POA: Insufficient documentation

## 2015-12-26 DIAGNOSIS — Z7901 Long term (current) use of anticoagulants: Secondary | ICD-10-CM | POA: Insufficient documentation

## 2015-12-26 DIAGNOSIS — K648 Other hemorrhoids: Secondary | ICD-10-CM | POA: Diagnosis not present

## 2015-12-26 DIAGNOSIS — I4891 Unspecified atrial fibrillation: Secondary | ICD-10-CM | POA: Diagnosis not present

## 2015-12-26 DIAGNOSIS — Z85820 Personal history of malignant melanoma of skin: Secondary | ICD-10-CM | POA: Insufficient documentation

## 2015-12-26 DIAGNOSIS — K5790 Diverticulosis of intestine, part unspecified, without perforation or abscess without bleeding: Secondary | ICD-10-CM | POA: Diagnosis not present

## 2015-12-26 DIAGNOSIS — F17210 Nicotine dependence, cigarettes, uncomplicated: Secondary | ICD-10-CM | POA: Diagnosis not present

## 2015-12-26 DIAGNOSIS — M1990 Unspecified osteoarthritis, unspecified site: Secondary | ICD-10-CM | POA: Diagnosis not present

## 2015-12-26 DIAGNOSIS — D120 Benign neoplasm of cecum: Secondary | ICD-10-CM | POA: Diagnosis not present

## 2015-12-26 DIAGNOSIS — I10 Essential (primary) hypertension: Secondary | ICD-10-CM | POA: Diagnosis not present

## 2015-12-26 DIAGNOSIS — Z9049 Acquired absence of other specified parts of digestive tract: Secondary | ICD-10-CM | POA: Diagnosis not present

## 2015-12-26 DIAGNOSIS — M353 Polymyalgia rheumatica: Secondary | ICD-10-CM | POA: Diagnosis not present

## 2015-12-26 DIAGNOSIS — D509 Iron deficiency anemia, unspecified: Secondary | ICD-10-CM | POA: Diagnosis not present

## 2015-12-26 DIAGNOSIS — K621 Rectal polyp: Secondary | ICD-10-CM | POA: Insufficient documentation

## 2015-12-26 DIAGNOSIS — E1151 Type 2 diabetes mellitus with diabetic peripheral angiopathy without gangrene: Secondary | ICD-10-CM | POA: Diagnosis not present

## 2015-12-26 DIAGNOSIS — K635 Polyp of colon: Secondary | ICD-10-CM | POA: Insufficient documentation

## 2015-12-26 HISTORY — PX: ESOPHAGOGASTRODUODENOSCOPY (EGD) WITH PROPOFOL: SHX5813

## 2015-12-26 HISTORY — PX: COLONOSCOPY WITH PROPOFOL: SHX5780

## 2015-12-26 LAB — GLUCOSE, CAPILLARY: Glucose-Capillary: 101 mg/dL — ABNORMAL HIGH (ref 65–99)

## 2015-12-26 SURGERY — COLONOSCOPY WITH PROPOFOL
Anesthesia: Monitor Anesthesia Care

## 2015-12-26 MED ORDER — GLUCAGON HCL RDNA (DIAGNOSTIC) 1 MG IJ SOLR
INTRAMUSCULAR | Status: AC
  Filled 2015-12-26: qty 1

## 2015-12-26 MED ORDER — GLUCAGON HCL RDNA (DIAGNOSTIC) 1 MG IJ SOLR
INTRAMUSCULAR | Status: DC | PRN
  Administered 2015-12-26: 1 mg via INTRAVENOUS

## 2015-12-26 MED ORDER — PROPOFOL 10 MG/ML IV BOLUS
INTRAVENOUS | Status: DC | PRN
  Administered 2015-12-26: 20 mg via INTRAVENOUS

## 2015-12-26 MED ORDER — PROPOFOL 10 MG/ML IV BOLUS
INTRAVENOUS | Status: AC
  Filled 2015-12-26: qty 20

## 2015-12-26 MED ORDER — LACTATED RINGERS IV SOLN
INTRAVENOUS | Status: DC | PRN
  Administered 2015-12-26: 12:00:00 via INTRAVENOUS

## 2015-12-26 MED ORDER — SODIUM CHLORIDE 0.9 % IJ SOLN
INTRAMUSCULAR | Status: DC | PRN
  Administered 2015-12-26: 2.5 mL

## 2015-12-26 MED ORDER — LIDOCAINE HCL (CARDIAC) 20 MG/ML IV SOLN
INTRAVENOUS | Status: AC
  Filled 2015-12-26: qty 5

## 2015-12-26 MED ORDER — LIDOCAINE HCL (CARDIAC) 20 MG/ML IV SOLN
INTRAVENOUS | Status: DC | PRN
  Administered 2015-12-26: 100 mg via INTRAVENOUS

## 2015-12-26 MED ORDER — BUTAMBEN-TETRACAINE-BENZOCAINE 2-2-14 % EX AERO
INHALATION_SPRAY | CUTANEOUS | Status: DC | PRN
  Administered 2015-12-26: 1 via TOPICAL

## 2015-12-26 MED ORDER — PROPOFOL 500 MG/50ML IV EMUL
INTRAVENOUS | Status: DC | PRN
  Administered 2015-12-26: 130 ug/kg/min via INTRAVENOUS

## 2015-12-26 MED ORDER — EPINEPHRINE HCL 0.1 MG/ML IJ SOSY
PREFILLED_SYRINGE | INTRAMUSCULAR | Status: AC
  Filled 2015-12-26: qty 10

## 2015-12-26 MED ORDER — PROPOFOL 10 MG/ML IV BOLUS
INTRAVENOUS | Status: AC
  Filled 2015-12-26: qty 40

## 2015-12-26 SURGICAL SUPPLY — 24 items

## 2015-12-26 NOTE — H&P (View-Only) (Signed)
HPI :  74 y/o female here for evaluation of new onset microcytic anemia in the setting of Xarelto use for atrial fibrillation. She reports she has felt poorly for the past few months, she felt fatigued and lightheadedness. She has also been craving ice. She had a Hgb of 7.5 noted on 12/11/15, this was down from a Hgb of 9s a few weeks previously.She has not had anemia like this previously. She had 2 units of PRBC transfusion this past Wed, Hgb from 7.5 to 11s. She has been taking an iron supplement twice daily.  Since her transfusion she has felt considerably better.  She reports she has had a hard time moving her bowels, with some constipation, and has been taking a fiber supplement which has helped a little bit. She has tried miralax but it causes cramping and doesn't want to take it. She has not been seeing any blood in the stools. She had a negative hemeoccult. No abdominal pains. She is eating okay. She is not vomiting or having postprandial pains. She had a prior remote EGD, she thinks she had some gastritis. She has previously had a lot of reflux / GERD. She has been taking 20mg  per day of omeprazole which has been helping her. She has been taking Xarelto for a history of atrial fibrillation. She has had this held since November 28th. She doesn't take any other NSAIDs.   Her mother had colon cancer at age 9.  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 Diabetes mellitus ([place])   . Hypertension   . Hyperlipidemia   . Atrial fibrillation ([place])   . Gastroparesis   . Fibromyalgia   . PMR (polymyalgia rheumatica) ([facility])   . PVD (peripheral vascular disease) ([place])   . Arthritis   . Melanoma ([place])   . Colon polyps   . Kidney stones   . Cataract     REMOVED BILATERAL  . Anemia     microcytic      Past Surgical History  Procedure Laterality Date  . Cesarean section    . Appendectomy    . Breast biopsy    . Colonoscopy    .  Polypectomy     Family History  Problem Relation Age of Onset  . CAD Father     MI and CABG  . Diabetes Father   . Heart disease Father   . Colon cancer Mother 76   Social History  Substance Use Topics  . Smoking status: Current Every Day Smoker -- 1.00 packs/day    Types: Cigarettes  . Smokeless tobacco: Never Used  . Alcohol Use: No   Current Outpatient Prescriptions  Medication Sig Dispense Refill  . acetaminophen (TYLENOL) 500 MG tablet Take 500 mg by mouth every 6 (six) hours as needed.    Marland Kitchen atorvastatin (LIPITOR) 40 MG tablet Take 40 mg by mouth daily.    . busPIRone (BUSPAR) 15 MG tablet Take 15 mg by mouth as needed.    . Cholecalciferol (VITAMIN D3) 1000 UNITS CAPS Take 1 tablet by mouth daily.    . empagliflozin (JARDIANCE) 25 MG TABS tablet Take 25 mg by mouth daily.    Marland Kitchen gabapentin (NEURONTIN) 300 MG capsule Take 300 mg by mouth 2 (two) times daily.    Marland Kitchen glimepiride (AMARYL) 4 MG tablet Take 8 mg by mouth daily.    Marland Kitchen HYDROcodone-acetaminophen (NORCO/VICODIN) 5-325 MG per tablet Take 1 tablet by mouth  as needed.    . insulin glargine (LANTUS) 100 UNIT/ML injection Inject 15 Units into the skin at bedtime.    . metoprolol tartrate (LOPRESSOR) 25 MG tablet Take 1 tablet (25 mg total) by mouth 2 (two) times daily. 180 tablet 3  . Multiple Vitamins-Minerals (CENTRUM SILVER PO) Take 1 tablet by mouth daily.    Marland Kitchen omeprazole (PRILOSEC) 20 MG capsule Take 20 mg by mouth daily.    . sertraline (ZOLOFT) 50 MG tablet Take 100 mg by mouth daily.    . vitamin B-12 (CYANOCOBALAMIN) 1000 MCG tablet Take 1,000 mcg by mouth daily.    . Na Sulfate-K Sulfate-Mg Sulf SOLN Take as directed per Colonoscopy. 354 mL 0  . rivaroxaban (XARELTO) 20 MG TABS tablet Take 20 mg by mouth daily. Reported on 12/15/2015     No current facility-administered medications for this visit.   Allergies  Allergen Reactions  . Metoclopramide Other (See Comments)    Tardive dyskinesia  . Gabapentin Nausea  And Vomiting    WEIGHT GAIN/FELT BAD  . Lyrica [Pregabalin] Swelling    TO LOWER EXTREMTIES     Review of Systems: All systems reviewed and negative except where noted in HPI.   Lab Results  Component Value Date   WBC 9.4 12/15/2015   HGB 11.3* 12/15/2015   HCT 36.2 12/15/2015   MCV 72.6* 12/15/2015   PLT 336.0 12/15/2015    Lab Results  Component Value Date   CREATININE 1.01* 11/27/2015   BUN 29* 11/27/2015   NA 136 11/27/2015   K 4.8 11/27/2015   CL 101 11/27/2015   CO2 23 11/27/2015    No results found for: ALT, AST, GGT, ALKPHOS, BILITOT   Physical Exam: BP 130/68 mmHg  Pulse 56  Ht 5\' 3"  (1.6 m)  Wt 164 lb 3.2 oz (74.481 kg)  BMI 29.09 kg/m2 Constitutional: Pleasant,well-developed, female in no acute distress. HEENT: Normocephalic and atraumatic. Conjunctivae are slightly pale. No scleral icterus. Neck supple.  Cardiovascular: Normal rate, regular rhythm. 2/6 SEM Pulmonary/chest: Effort normal and breath sounds normal. No wheezing, rales or rhonchi. Abdominal: Soft, nondistended, nontender. Bowel sounds active throughout. There are no masses palpable. No hepatomegaly. Extremities: no edema Lymphadenopathy: No cervical adenopathy noted. Neurological: Alert and oriented to person place and time. Skin: Skin is warm and dry. No rashes noted. Psychiatric: Normal mood and affect. Behavior is normal.   ASSESSMENT AND PLAN: 74 y/o female with history of atrial fibrillation on Xarelto who presents with a progressive microcytic anemia in recent weeks. Hgb went from 9 two weeks ago, to 7s earlier this week, now s/p 2 units PRBC transfusion and Hgb has intervally increased to 11s, and she has responded appropriately. She has not had any overt bleeding. She has had her Xarelto held and placed on oral iron, and is on omeprazole.   I suspect she has an iron deficiency given new onset microcytosis however iron studies were not obtained and if drawn now will not be accurate  following recent transfusion. I discussed differential for iron deficiency with her. The last colonoscopy she had earlier this year showed a 1cm cecal AVM, and this certainly could be the culprit and oozing slowly in the setting of Xarelto use. It is also possible she has other AVMs of the small bowel, or other upper tract pathology. I am recommending EGD and colonoscopy to be done as soon as possible to further evaluate this issue, and will be done in the near future. These procedures need to  be done at [facility] given to have APC available to ablate the cecal AVM, pending no other significant pathology is found. If she has any interval overt blood loss or worsening anemia to cause symptoms again, she may need to be admitted to have this done sooner. She will continue to hold Xarelto and avoid all NSAIDs, and continue oral iron. She understands the risks of withholding anticoagulation otherwise at this time, however given the severity of her anemia it is warranted until this issue is addressed.   The indications, risks, and benefits of EGD and colonoscopy were explained to the patient in detail. Risks include but are not limited to bleeding, perforation, adverse reaction to medications, and cardiopulmonary compromise. Sequelae include but are not limited to the possibility of surgery, hositalization, and mortality. The patient verbalized understanding and wished to proceed. All questions answered, referred to scheduler and bowel prep ordered. Further recommendations pending results of the exam.   [place] Cellar, MD [facility] Pager [phone]  CC: Leamon Arnt, MD

## 2015-12-26 NOTE — Transfer of Care (Signed)
Immediate Anesthesia Transfer of Care Note  Patient: Kathleen Griffith  Procedure(s) Performed: Procedure(s): COLONOSCOPY WITH PROPOFOL (N/A) ESOPHAGOGASTRODUODENOSCOPY (EGD) WITH PROPOFOL (N/A)  Patient Location: PACU and Endoscopy Unit  Anesthesia Type:MAC  Level of Consciousness: awake, alert , oriented and patient cooperative  Airway & Oxygen Therapy: Patient Spontanous Breathing and Patient connected to face mask oxygen  Post-op Assessment: Report given to RN, Post -op Vital signs reviewed and stable and Patient moving all extremities  Post vital signs: Reviewed and stable  Last Vitals:  Filed Vitals:   12/26/15 1229  BP: 134/49  Pulse: 64  Resp: 12    Complications: No apparent anesthesia complications

## 2015-12-26 NOTE — Discharge Instructions (Signed)
Colonoscopy, Care After °Refer to this sheet in the next few weeks. These instructions provide you with information on caring for yourself after your procedure. Your health care provider may also give you more specific instructions. Your treatment has been planned according to current medical practices, but problems sometimes occur. Call your health care provider if you have any problems or questions after your procedure. °WHAT TO EXPECT AFTER THE PROCEDURE  °After your procedure, it is typical to have the following: °· A small amount of blood in your stool. °· Moderate amounts of gas and mild abdominal cramping or bloating. °HOME CARE INSTRUCTIONS °· Do not drive, operate machinery, or sign important documents for 24 hours. °· You may shower and resume your regular physical activities, but move at a slower pace for the first 24 hours. °· Take frequent rest periods for the first 24 hours. °· Walk around or put a warm pack on your abdomen to help reduce abdominal cramping and bloating. °· Drink enough fluids to keep your urine clear or pale yellow. °· You may resume your normal diet as instructed by your health care provider. Avoid heavy or fried foods that are hard to digest. °· Avoid drinking alcohol for 24 hours or as instructed by your health care provider. °· Only take over-the-counter or prescription medicines as directed by your health care provider. °· If a tissue sample (biopsy) was taken during your procedure: °¨ Do not take aspirin or blood thinners for 7 days, or as instructed by your health care provider. °¨ Do not drink alcohol for 7 days, or as instructed by your health care provider. °¨ Eat soft foods for the first 24 hours. °SEEK MEDICAL CARE IF: °You have persistent spotting of blood in your stool 2-3 days after the procedure. °SEEK IMMEDIATE MEDICAL CARE IF: °· You have more than a small spotting of blood in your stool. °· You pass large blood clots in your stool. °· Your abdomen is swollen  (distended). °· You have nausea or vomiting. °· You have a fever. °· You have increasing abdominal pain that is not relieved with medicine. °  °This information is not intended to replace advice given to you by your health care provider. Make sure you discuss any questions you have with your health care provider. °  °Document Released: 07/30/2004 Document Revised: 10/06/2013 Document Reviewed: 08/23/2013 °Elsevier Interactive Patient Education ©2016 [employer] ° ° °Esophagogastroduodenoscopy, Care After °Refer to this sheet in the next few weeks. These instructions provide you with information about caring for yourself after your procedure. Your health care provider may also give you more specific instructions. Your treatment has been planned according to current medical practices, but problems sometimes occur. Call your health care provider if you have any problems or questions after your procedure. °WHAT TO EXPECT AFTER THE PROCEDURE °After your procedure, it is typical to feel: °· Soreness in your throat. °· Pain with swallowing. °· Sick to your stomach (nauseous). °· Bloated. °· Dizzy. °· Fatigued. °HOME CARE INSTRUCTIONS °· Do not eat or drink anything until the numbing medicine (local anesthetic) has worn off and your gag reflex has returned. You will know that the local anesthetic has worn off when you can swallow comfortably. °· Do not drive or operate machinery until directed by your health care provider. °· Take medicines only as directed by your health care provider. °SEEK MEDICAL CARE IF:  °· You cannot stop coughing. °· You are not urinating at all or less than usual. °SEEK   IMMEDIATE MEDICAL CARE IF: °· You have difficulty swallowing. °· You cannot eat or drink. °· You have worsening throat or chest pain. °· You have dizziness or lightheadedness or you faint. °· You have nausea or vomiting. °· You have chills. °· You have a fever. °· You have severe abdominal pain. °· You have black, tarry, or bloody  stools. °  °This information is not intended to replace advice given to you by your health care provider. Make sure you discuss any questions you have with your health care provider. °  °Document Released: 12/02/2012 Document Revised: 01/06/2015 Document Reviewed: 12/02/2012 °Elsevier Interactive Patient Education ©2016 [employer] ° °

## 2015-12-26 NOTE — Anesthesia Preprocedure Evaluation (Addendum)
Anesthesia Evaluation  Patient identified by MRN, date of birth, ID band Patient awake    Reviewed: Allergy & Precautions, NPO status , Patient's Chart, lab work & pertinent test results, reviewed documented beta blocker date and time   Airway Mallampati: II  TM Distance: >3 FB Neck ROM: Full    Dental no notable dental hx.    Pulmonary Current Smoker,    Pulmonary exam normal breath sounds clear to auscultation       Cardiovascular hypertension, Pt. on medications and Pt. on home beta blockers + Peripheral Vascular Disease  Normal cardiovascular exam Rhythm:Regular Rate:Normal     Neuro/Psych  Neuromuscular disease negative psych ROS   GI/Hepatic negative GI ROS, Neg liver ROS,   Endo/Other  diabetes, Type 2  Renal/GU Renal disease     Musculoskeletal  (+) Arthritis , Fibromyalgia -  Abdominal   Peds  Hematology negative hematology ROS (+) anemia ,   Anesthesia Other Findings   Reproductive/Obstetrics negative OB ROS                            Anesthesia Physical Anesthesia Plan  ASA: III  Anesthesia Plan: MAC   Post-op Pain Management:    Induction: Intravenous  Airway Management Planned: Natural Airway and Nasal Cannula  Additional Equipment:   Intra-op Plan:   Post-operative Plan:   Informed Consent: I have reviewed the patients History and Physical, chart, labs and discussed the procedure including the risks, benefits and alternatives for the proposed anesthesia with the patient or authorized representative who has indicated his/her understanding and acceptance.   Dental advisory given  Plan Discussed with: CRNA  Anesthesia Plan Comments:        Anesthesia Quick Evaluation

## 2015-12-26 NOTE — Op Note (Signed)
[facility] [place], [address]   ENDOSCOPY PROCEDURE REPORT  PATIENT: Kathleen, Griffith  MR#: [id number] BIRTHDATE: 1941/08/21 , 74  yrs. old GENDER: female ENDOSCOPIST: Yetta Flock, MD REFERRED BY: PROCEDURE DATE:  12/26/2015 PROCEDURE:  EGD w/ snare polypectomy and EGD w/ biopsy ASA CLASS:     Class III INDICATIONS:  iron deficiency anemia. MEDICATIONS: Per Anesthesia TOPICAL ANESTHETIC:  DESCRIPTION OF PROCEDURE: After the risks benefits and alternatives of the procedure were thoroughly explained, informed consent was obtained.  The Pentax Gastroscope [id number] endoscope was introduced through the mouth and advanced to the second portion of the duodenum , Without limitations.  The instrument was slowly withdrawn as the mucosa was fully examined.   FINDINGS: The esophagus was normal.  DH, GEJ, and SCJ were located 38cm from the incisors.  A roughly 86mm sessile polyp that appeared potentially adenomatous was noted in the fundus and removed via hot snare.  A 3-71mm sessile polyp was noted in the gastric body and removed with cold forceps.  A diminutive AVM without stigmata of bleeding was noted in the gastric body.  The duodenal bulb and 2nd portion of the duodenum were otherwise normal.  Retroflexed views revealed no abnormalities.     The scope was then withdrawn from the patient and the procedure completed.  COMPLICATIONS: There were no immediate complications.  ENDOSCOPIC IMPRESSION: Normal esophagus 33mm gastric polyp removed via snare, 3-75mm gastric polyp removed via forceps Diminutive gastric AVM of likely no clinical significance, it was not intervened upon Normal duodenum   RECOMMENDATIONS: No NSAIDS for 2 weeks Resume diet Await pathology results   eSigned:  Yetta Flock, MD 12/26/2015 2:39 PM    CC: the patient  PATIENT NAME:  Kathleen, Griffith MR#: [id number]

## 2015-12-26 NOTE — Op Note (Signed)
[facility] [place], [address]   COLONOSCOPY PROCEDURE REPORT  PATIENT: Seri, Tarpinian  MR#: [id number] BIRTHDATE: 1941/01/12 , 74  yrs. old GENDER: female ENDOSCOPIST: Yetta Flock, MD REFERRED BY: PROCEDURE DATE:  12/26/2015 PROCEDURE:   Colonoscopy, diagnostic, Colonoscopy with ablation, Colonoscopy with snare polypectomy, and Colonoscopy with biopsy First Screening Colonoscopy - Avg.  risk and is 50 yrs.  old or older - No.          Polyps removed today? Yes ASA CLASS:   Class III INDICATIONS:Unexplained iron deficiency anemia in the setting of Xarelto and known colonic AVM, and Colorectal Neoplasm Risk Assessment for this procedure is average risk. MEDICATIONS: Per Anesthesia  DESCRIPTION OF PROCEDURE:   After the risks benefits and alternatives of the procedure were thoroughly explained, informed consent was obtained.  The digital rectal exam revealed no abnormalities of the rectum.   The loaner [id number]       endoscope was introduced through the anus and advanced to the terminal ileum which was intubated for a short distance. No adverse events experienced.   The quality of the prep was adequate  The instrument was then slowly withdrawn as the colon was fully examined. Estimated blood loss is zero unless otherwise noted in this procedure report.   COLON FINDINGS: Two moderate sized AVMs were appreciated in the cecum without stigmata of bleeding.  They were both injected with epinephrine (total 2.5cc) and then ablated using APC with good result and no evidence of bleeding.  A 75mm sessile polyp was noted in the cecum and removed with cold forceps.  Mild diverticulosis was noted in the sigmoid colon.  Multiple diminutive benign appearing hyperplastic polyps were noted in the rectum and sigmoid colon.  The terminal ileum was normal.  The colon was spastic and glucagon was used to treat spasm during the procedure.  The remainder of the  examined colon was normal.  Retroflexed views revealed internal hemorrhoids. The time to cecum = 7.6 Withdrawal time = 29.5   The scope was withdrawn and the procedure completed. COMPLICATIONS: There were no immediate complications.    ENDOSCOPIC IMPRESSION: Two AVMs treated as above One small cecal polyp Mild diverticulosis Internal hemorrhoids  RECOMMENDATIONS: Resume diet No NSAIDs for 2 weeks Continue to hold Xarelto for now (can take baby aspirin for now) Repeat CBC last this week to ensure stable Await pathology results Continue oral iron If next CBC is stable can likely resume Xarelto in the near future, you will be contacted with further instruction regarding this  eSigned:  Yetta Flock, MD 12/26/2015 2:33 PM   cc: the patient   PATIENT NAME:  Cynthie, Ramsell MR#: [id number]

## 2015-12-26 NOTE — Anesthesia Postprocedure Evaluation (Signed)
Anesthesia Post Note  Patient: Kathleen Griffith  Procedure(s) Performed: Procedure(s) (LRB): COLONOSCOPY WITH PROPOFOL (N/A) ESOPHAGOGASTRODUODENOSCOPY (EGD) WITH PROPOFOL (N/A)  Patient location during evaluation: PACU Anesthesia Type: MAC Level of consciousness: awake and alert Pain management: pain level controlled Vital Signs Assessment: post-procedure vital signs reviewed and stable Respiratory status: spontaneous breathing Cardiovascular status: stable Anesthetic complications: no    Last Vitals:  Filed Vitals:   12/26/15 1229 12/26/15 1432  BP: 134/49 202/74  Pulse: 64 84  Temp:  36.5 C  Resp: 12 22    Last Pain: There were no vitals filed for this visit.               Nolon Nations

## 2015-12-26 NOTE — Interval H&P Note (Signed)
History and Physical Interval Note:  12/26/2015 1:10 PM  Kathleen Griffith  has presented today for surgery, with the diagnosis of anmia   The various methods of treatment have been discussed with the patient and family. After consideration of risks, benefits and other options for treatment, the patient has consented to  Procedure(s): COLONOSCOPY WITH PROPOFOL (N/A) ESOPHAGOGASTRODUODENOSCOPY (EGD) WITH PROPOFOL (N/A) as a surgical intervention .  The patient's history has been reviewed, patient examined, no change in status, stable for surgery.  I have reviewed the patient's chart and labs.  Questions were answered to the patient's satisfaction.     Renelda Loma Tima Curet

## 2015-12-27 ENCOUNTER — Encounter (HOSPITAL_COMMUNITY): Payer: Self-pay | Admitting: Gastroenterology

## 2015-12-28 ENCOUNTER — Other Ambulatory Visit: Payer: Self-pay

## 2015-12-28 DIAGNOSIS — D649 Anemia, unspecified: Secondary | ICD-10-CM

## 2015-12-29 ENCOUNTER — Other Ambulatory Visit (INDEPENDENT_AMBULATORY_CARE_PROVIDER_SITE_OTHER): Payer: Medicare HMO

## 2015-12-29 ENCOUNTER — Other Ambulatory Visit: Payer: Self-pay

## 2015-12-29 DIAGNOSIS — K921 Melena: Secondary | ICD-10-CM

## 2015-12-29 DIAGNOSIS — D649 Anemia, unspecified: Secondary | ICD-10-CM | POA: Diagnosis not present

## 2015-12-29 LAB — CBC WITH DIFFERENTIAL/PLATELET
BASOS ABS: 0.2 10*3/uL — AB (ref 0.0–0.1)
Basophils Relative: 1.7 % (ref 0.0–3.0)
Eosinophils Absolute: 0.2 10*3/uL (ref 0.0–0.7)
Eosinophils Relative: 2.6 % (ref 0.0–5.0)
HCT: 38 % (ref 36.0–46.0)
Hemoglobin: 11.5 g/dL — ABNORMAL LOW (ref 12.0–15.0)
LYMPHS ABS: 1.8 10*3/uL (ref 0.7–4.0)
Lymphocytes Relative: 19.5 % (ref 12.0–46.0)
MCHC: 30.2 g/dL (ref 30.0–36.0)
MCV: 73.4 fl — AB (ref 78.0–100.0)
MONO ABS: 0.6 10*3/uL (ref 0.1–1.0)
MONOS PCT: 6.5 % (ref 3.0–12.0)
NEUTROS ABS: 6.3 10*3/uL (ref 1.4–7.7)
NEUTROS PCT: 69.7 % (ref 43.0–77.0)
PLATELETS: 425 10*3/uL — AB (ref 150.0–400.0)
RBC: 5.18 Mil/uL — ABNORMAL HIGH (ref 3.87–5.11)
RDW: 23.9 % — ABNORMAL HIGH (ref 11.5–15.5)
WBC: 9 10*3/uL (ref 4.0–10.5)

## 2016-01-10 ENCOUNTER — Telehealth: Payer: Self-pay | Admitting: Gastroenterology

## 2016-01-10 NOTE — Telephone Encounter (Signed)
Spoke with Dr. Tamela Oddi office and they will order the CBC and sent Dr. Havery Moros the results.

## 2016-01-10 NOTE — Telephone Encounter (Signed)
Attempted to call Dr. Tamela Oddi office but they are closed currently. Left a message for patient to call me back.

## 2016-01-17 ENCOUNTER — Emergency Department (INDEPENDENT_AMBULATORY_CARE_PROVIDER_SITE_OTHER): Payer: Medicare Other

## 2016-01-17 ENCOUNTER — Encounter (HOSPITAL_COMMUNITY): Payer: Self-pay | Admitting: Emergency Medicine

## 2016-01-17 ENCOUNTER — Emergency Department (INDEPENDENT_AMBULATORY_CARE_PROVIDER_SITE_OTHER)
Admission: EM | Admit: 2016-01-17 | Discharge: 2016-01-17 | Disposition: A | Discharge status: Home / Self Care | Payer: Medicare Other | Source: Home / Self Care | Attending: Family Medicine | Admitting: Family Medicine

## 2016-01-17 DIAGNOSIS — M5431 Sciatica, right side: Secondary | ICD-10-CM

## 2016-01-17 MED ORDER — KETOROLAC TROMETHAMINE 60 MG/2ML IM SOLN
60.0000 mg | Freq: Once | INTRAMUSCULAR | Status: AC
  Administered 2016-01-17: 60 mg via INTRAMUSCULAR

## 2016-01-17 MED ORDER — OXYCODONE-ACETAMINOPHEN 5-325 MG PO TABS: 2.0000 | ORAL_TABLET | ORAL | Status: DC | PRN

## 2016-01-17 MED ORDER — METHYLPREDNISOLONE SODIUM SUCC 125 MG IJ SOLR
INTRAMUSCULAR | Status: AC
  Filled 2016-01-17: qty 2

## 2016-01-17 MED ORDER — KETOROLAC TROMETHAMINE 60 MG/2ML IM SOLN
INTRAMUSCULAR | Status: AC
  Filled 2016-01-17: qty 2

## 2016-01-17 MED ORDER — METHYLPREDNISOLONE SODIUM SUCC 125 MG IJ SOLR
125.0000 mg | Freq: Once | INTRAMUSCULAR | Status: AC
  Administered 2016-01-17: 125 mg via INTRAMUSCULAR

## 2016-01-17 NOTE — ED Notes (Signed)
Patient transported to X-ray 

## 2016-01-17 NOTE — ED Notes (Signed)
Right hip pain and right thigh pain for 2 weeks.

## 2016-01-17 NOTE — Discharge Instructions (Signed)

## 2016-01-17 NOTE — ED Provider Notes (Signed)
CSN: [id number]     Arrival date & time 01/17/16  1601 History   First MD Initiated Contact with Patient 01/17/16 1727     Chief Complaint  Patient presents with  . Hip Pain   (Consider location/radiation/quality/duration/timing/severity/associated sxs/prior Treatment) Patient is a 75 y.o. female presenting with hip pain. The history is provided by the patient. No language interpreter was used.  Hip Pain This is a recurrent problem. The current episode started more than 1 week ago. The problem occurs constantly. The problem has been gradually worsening. Nothing aggravates the symptoms. Nothing relieves the symptoms. She has tried acetaminophen for the symptoms. The treatment provided no relief.  Pt complains of pain in right hip,  Down right leg.  Pt seen here by me in July for back pain similar to this.  Pt reports she had relief with injection given here.  Pt received torodol and solumedrol.    Past Medical History  Diagnosis Date  . Diabetes mellitus ([place])   . Hypertension   . Hyperlipidemia   . Atrial fibrillation ([place])   . Gastroparesis   . Fibromyalgia   . PMR (polymyalgia rheumatica) ([facility])   . PVD (peripheral vascular disease) ([place])   . Arthritis   . Melanoma ([place])   . Colon polyps   . Kidney stones   . Cataract     REMOVED BILATERAL  . Anemia     microcytic    Past Surgical History  Procedure Laterality Date  . Cesarean section    . Appendectomy    . Breast biopsy    . Colonoscopy    . Polypectomy    . Colonoscopy with propofol N/A 12/26/2015    Procedure: COLONOSCOPY WITH PROPOFOL;  Surgeon: Manus Gunning, MD;  Location: [facility] ENDOSCOPY;  Service: Gastroenterology;  Laterality: N/A;  . Esophagogastroduodenoscopy (egd) with propofol N/A 12/26/2015    Procedure: ESOPHAGOGASTRODUODENOSCOPY (EGD) WITH PROPOFOL;  Surgeon: Manus Gunning, MD;  Location: [facility] ENDOSCOPY;  Service: Gastroenterology;  Laterality: N/A;   Family History  Problem Relation Age of  Onset  . CAD Father     MI and CABG  . Diabetes Father   . Heart disease Father   . Colon cancer Mother 41   Social History  Substance Use Topics  . Smoking status: Current Every Day Smoker -- 1.00 packs/day    Types: Cigarettes  . Smokeless tobacco: Never Used  . Alcohol Use: No   OB History    No data available     Review of Systems  All other systems reviewed and are negative.   Allergies  Metoclopramide; Lyrica; and Metformin and related  Home Medications   Prior to Admission medications   Medication Sig Start Date End Date Taking? Authorizing Provider  acetaminophen (TYLENOL) 500 MG tablet Take 1,000 mg by mouth every 6 (six) hours as needed for mild pain.     Historical Provider, MD  atorvastatin (LIPITOR) 40 MG tablet Take 40 mg by mouth daily. 01/18/15   Historical Provider, MD  busPIRone (BUSPAR) 15 MG tablet Take 15 mg by mouth daily as needed (anxiety).  03/17/15 03/16/16  Historical Provider, MD  Cholecalciferol (VITAMIN D3) 1000 UNITS CAPS Take 1 tablet by mouth daily.    Historical Provider, MD  empagliflozin (JARDIANCE) 25 MG TABS tablet Take 25 mg by mouth daily.    Historical Provider, MD  gabapentin (NEURONTIN) 300 MG capsule Take 300 mg by mouth 2 (two) times daily.    Historical Provider, MD  glimepiride (AMARYL)  4 MG tablet Take 8 mg by mouth daily. 03/17/15   Historical Provider, MD  HYDROcodone-acetaminophen (NORCO/VICODIN) 5-325 MG per tablet Take 1 tablet by mouth every 4 (four) hours as needed for moderate pain.  03/17/15   Historical Provider, MD  insulin glargine (LANTUS) 100 UNIT/ML injection Inject 15 Units into the skin at bedtime.    Historical Provider, MD  iron polysaccharides (NIFEREX) 150 MG capsule Take 150 mg by mouth 2 (two) times daily.    Historical Provider, MD  metoprolol tartrate (LOPRESSOR) 25 MG tablet Take 1 tablet (25 mg total) by mouth 2 (two) times daily. 11/27/15   Lelon Perla, MD  Multiple Vitamins-Minerals (CENTRUM SILVER  PO) Take 1 tablet by mouth daily.    Historical Provider, MD  Na Sulfate-K Sulfate-Mg Sulf SOLN Take as directed per Colonoscopy. 12/15/15   Manus Gunning, MD  omeprazole (PRILOSEC) 20 MG capsule Take 20 mg by mouth 2 (two) times daily before a meal.     Historical Provider, MD  sertraline (ZOLOFT) 50 MG tablet Take 100 mg by mouth daily. 03/17/15   Historical Provider, MD  vitamin B-12 (CYANOCOBALAMIN) 1000 MCG tablet Take 1,000 mcg by mouth daily.    Historical Provider, MD   Meds Ordered and Administered this Visit   Medications  methylPREDNISolone sodium succinate (SOLU-MEDROL) 125 mg/2 mL injection 125 mg (not administered)  ketorolac (TORADOL) injection 60 mg (not administered)    BP 155/65 mmHg  Pulse 65  Temp(Src) 97.7 F (36.5 C) (Oral)  SpO2 97% No data found.   Physical Exam  Constitutional: She is oriented to person, place, and time. She appears well-developed and well-nourished.  HENT:  Head: Normocephalic.  Eyes: EOM are normal.  Neck: Normal range of motion.  Pulmonary/Chest: Effort normal.  Abdominal: She exhibits no distension.  Musculoskeletal: Normal range of motion.  Pain with movement of hip  Neurological: She is alert and oriented to person, place, and time.  Psychiatric: She has a normal mood and affect.  Nursing note and vitals reviewed.   ED Course  Procedures (including critical care time)  Labs Review Labs Reviewed - No data to display  Imaging Review Dg Hip Unilat With Pelvis 2-3 Views Right  01/17/2016  CLINICAL DATA:  Right hip pain EXAM: DG HIP (WITH OR WITHOUT PELVIS) 2-3V RIGHT COMPARISON:  None FINDINGS: Three views of the right hip submitted. No acute fracture or subluxation. Bilateral hip joints are symmetrical in appearance. Mild degenerative changes bilateral SI joint. Minimal spurring bilateral greater femoral trochanter. Mild degenerative changes pubic symphysis. IMPRESSION: No acute fracture or subluxation. Mild degenerative  changes as described above. Electronically Signed   By: Lahoma Crocker M.D.   On: 01/17/2016 18:04     Visual Acuity Review  Right Eye Distance:   Left Eye Distance:   Bilateral Distance:    Right Eye Near:   Left Eye Near:    Bilateral Near:         MDM  I suspect pt is having reoccurring sciatica.  I will try same treatment.  Pt is advised to see her Md for recheck    1. Sciatica, right    Meds ordered this encounter  Medications  . methylPREDNISolone sodium succinate (SOLU-MEDROL) 125 mg/2 mL injection 125 mg    Sig:   . ketorolac (TORADOL) injection 60 mg    Sig:   . oxyCODONE-acetaminophen (PERCOCET/ROXICET) 5-325 MG tablet    Sig: Take 2 tablets by mouth every 4 (four) hours as needed  for severe pain.    Dispense:  16 tablet    Refill:  0    Order Specific Question:  Supervising Provider    Answer:  Billy Fischer [phone]  An After Visit Summary was printed and given to the patient.    [place], PA-C 01/17/16 1910

## 2016-01-19 ENCOUNTER — Telehealth: Payer: Self-pay | Admitting: Gastroenterology

## 2016-01-19 ENCOUNTER — Other Ambulatory Visit: Payer: Self-pay | Admitting: *Deleted

## 2016-01-19 DIAGNOSIS — D509 Iron deficiency anemia, unspecified: Secondary | ICD-10-CM

## 2016-01-19 NOTE — Telephone Encounter (Signed)
Spoke with [facility] and scheduled Feraheme on 01/26/16 at 11:00 AM and 02/02/16 at 12:00 PM. Patient aware. Orders in EPIC.

## 2016-01-19 NOTE — Telephone Encounter (Signed)
Patient given results and recommendations. She is on Xarelto now. She will come for lab on 02/10/16. Lab in EPIC, Left a message for [place] with [facility] short stay to call to set up IV iron.

## 2016-01-19 NOTE — Telephone Encounter (Signed)
[name] I received blood work for Mrs. Kathleen Griffith from 01-10-16, done by her PCM. Her Hgb was stable at 11.2 but MCV remains low at 71.9, and I believe she is on oral iron. Can you let her know Hgb is stable, and she should be back on Xarelto now if you can please confirm that. Otherwise, if she is still on oral iron with ongoing microcytosis, she would benefit from IV iron if you can help coordinate that for her as well. We should otherwise repeat a CBC about 4 weeks from her last draw (so around Feb 11th) or so.

## 2016-01-22 ENCOUNTER — Emergency Department (HOSPITAL_COMMUNITY)
Admission: EM | Admit: 2016-01-22 | Discharge: 2016-01-23 | Discharge status: Against Medical Advice | Payer: Medicare Other | Attending: Emergency Medicine | Admitting: Emergency Medicine

## 2016-01-22 ENCOUNTER — Encounter (HOSPITAL_COMMUNITY): Payer: Self-pay | Admitting: Emergency Medicine

## 2016-01-22 DIAGNOSIS — F17210 Nicotine dependence, cigarettes, uncomplicated: Secondary | ICD-10-CM | POA: Diagnosis not present

## 2016-01-22 DIAGNOSIS — I10 Essential (primary) hypertension: Secondary | ICD-10-CM | POA: Insufficient documentation

## 2016-01-22 DIAGNOSIS — M25551 Pain in right hip: Secondary | ICD-10-CM | POA: Insufficient documentation

## 2016-01-22 DIAGNOSIS — E119 Type 2 diabetes mellitus without complications: Secondary | ICD-10-CM | POA: Diagnosis not present

## 2016-01-22 NOTE — ED Notes (Signed)
Pt sts she had an xray of hip done at urgent care and was told she has arthritis.

## 2016-01-22 NOTE — ED Notes (Signed)
Pt c/o R hip pain sudden onset 3 weeks ago. Pt denies injury. Denies deformity, redness, swelling. Pt ambulatory. Pt sts "it feels like when your panties are too tight and are cutting off your circulation."  Pt A&Ox4. Pt has been seen at urgent care and was given percocet and a muscle relaxant for pain. Pt sts "it helps a little bit but I'm almost out." Pt sts she had a blood transfusion in December and is supposed to come back here next week for iron infusion.

## 2016-01-26 ENCOUNTER — Encounter (HOSPITAL_COMMUNITY): Payer: Self-pay

## 2016-01-26 ENCOUNTER — Other Ambulatory Visit (HOSPITAL_COMMUNITY): Payer: Self-pay | Admitting: Gastroenterology

## 2016-01-26 ENCOUNTER — Encounter (HOSPITAL_COMMUNITY)
Admission: RE | Admit: 2016-01-26 | Discharge: 2016-01-26 | Disposition: A | Discharge status: Home / Self Care | Payer: Medicare Other | Source: Ambulatory Visit | Attending: Gastroenterology | Admitting: Gastroenterology

## 2016-01-26 VITALS — BP 135/53 | HR 50 | Temp 98.2°F | Resp 18

## 2016-01-26 DIAGNOSIS — D509 Iron deficiency anemia, unspecified: Secondary | ICD-10-CM | POA: Diagnosis not present

## 2016-01-26 MED ORDER — SODIUM CHLORIDE 0.9 % IV SOLN
510.0000 mg | INTRAVENOUS | Status: DC
  Administered 2016-01-26: 510 mg via INTRAVENOUS
  Filled 2016-01-26: qty 17

## 2016-01-26 MED ORDER — SODIUM CHLORIDE 0.9 % IV SOLN
Freq: Once | INTRAVENOUS | Status: AC
  Administered 2016-01-26: 11:00:00 via INTRAVENOUS

## 2016-01-26 NOTE — Progress Notes (Signed)
Uneventful infusion of Feraheme #1/2 of this series. Next scheduled appointment is 02/02/16

## 2016-01-26 NOTE — Discharge Instructions (Signed)
FERAHEME °Ferumoxytol injection °What is this medicine? °FERUMOXYTOL is an iron complex. Iron is used to make healthy red blood cells, which carry oxygen and nutrients throughout the body. This medicine is used to treat iron deficiency anemia in people with chronic kidney disease. °This medicine may be used for other purposes; ask your health care provider or pharmacist if you have questions. °What should I tell my health care provider before I take this medicine? °They need to know if you have any of these conditions: °-anemia not caused by low iron levels °-high levels of iron in the blood °-magnetic resonance imaging (MRI) test scheduled °-an unusual or allergic reaction to iron, other medicines, foods, dyes, or preservatives °-pregnant or trying to get pregnant °-breast-feeding °How should I use this medicine? °This medicine is for injection into a vein. It is given by a health care professional in a hospital or clinic setting. °Talk to your pediatrician regarding the use of this medicine in children. Special care may be needed. °Overdosage: If you think you have taken too much of this medicine contact a poison control center or emergency room at once. °NOTE: This medicine is only for you. Do not share this medicine with others. °What if I miss a dose? °It is important not to miss your dose. Call your doctor or health care professional if you are unable to keep an appointment. °What may interact with this medicine? °This medicine may interact with the following medications: °-other iron products °This list may not describe all possible interactions. Give your health care provider a list of all the medicines, herbs, non-prescription drugs, or dietary supplements you use. Also tell them if you smoke, drink alcohol, or use illegal drugs. Some items may interact with your medicine. °What should I watch for while using this medicine? °Visit your doctor or healthcare professional regularly. Tell your doctor or  healthcare professional if your symptoms do not start to get better or if they get worse. You may need blood work done while you are taking this medicine. °You may need to follow a special diet. Talk to your doctor. Foods that contain iron include: whole grains/cereals, dried fruits, beans, or peas, leafy green vegetables, and organ meats (liver, kidney). °What side effects may I notice from receiving this medicine? °Side effects that you should report to your doctor or health care professional as soon as possible: °-allergic reactions like skin rash, itching or hives, swelling of the face, lips, or tongue °-breathing problems °-changes in blood pressure °-feeling faint or lightheaded, falls °-fever or chills °-flushing, sweating, or hot feelings °-swelling of the ankles or feet °Side effects that usually do not require medical attention (Report these to your doctor or health care professional if they continue or are bothersome.): °-diarrhea °-headache °-nausea, vomiting °-stomach pain °This list may not describe all possible side effects. Call your doctor for medical advice about side effects. You may report side effects to FDA at 1-800-FDA-1088. °Where should I keep my medicine? °This drug is given in a hospital or clinic and will not be stored at home. °NOTE: This sheet is a summary. It may not cover all possible information. If you have questions about this medicine, talk to your doctor, pharmacist, or health care provider. °  °© 2016, Elsevier/Gold Standard. (2012-07-31 15:23:36) °Anemia, Nonspecific °Anemia is a condition in which the concentration of red blood cells or hemoglobin in the blood is below normal. Hemoglobin is a substance in red blood cells that carries oxygen to the tissues   of the body. Anemia results in not enough oxygen reaching these tissues.  °CAUSES  °Common causes of anemia include:  °· Excessive bleeding. Bleeding may be internal or external. This includes excessive bleeding from periods  (in women) or from the intestine.   °· Poor nutrition.   °· Chronic kidney, thyroid, and liver disease.  °· Bone marrow disorders that decrease red blood cell production. °· Cancer and treatments for cancer. °· HIV, AIDS, and their treatments. °· Spleen problems that increase red blood cell destruction. °· Blood disorders. °· Excess destruction of red blood cells due to infection, medicines, and autoimmune disorders. °SIGNS AND SYMPTOMS  °· Minor weakness.   °· Dizziness.   °· Headache. °· Palpitations.   °· Shortness of breath, especially with exercise.   °· Paleness. °· Cold sensitivity. °· Indigestion. °· Nausea. °· Difficulty sleeping. °· Difficulty concentrating. °Symptoms may occur suddenly or they may develop slowly.  °DIAGNOSIS  °Additional blood tests are often needed. These help your health care provider determine the best treatment. Your health care provider will check your stool for blood and look for other causes of blood loss.  °TREATMENT  °Treatment varies depending on the cause of the anemia. Treatment can include:  °· Supplements of iron, vitamin B12, or folic acid.   °· Hormone medicines.   °· A blood transfusion. This may be needed if blood loss is severe.   °· Hospitalization. This may be needed if there is significant continual blood loss.   °· Dietary changes. °· Spleen removal. °HOME CARE INSTRUCTIONS °Keep all follow-up appointments. It often takes many weeks to correct anemia, and having your health care provider check on your condition and your response to treatment is very important. °SEEK IMMEDIATE MEDICAL CARE IF:  °· You develop extreme weakness, shortness of breath, or chest pain.   °· You become dizzy or have trouble concentrating. °· You develop heavy vaginal bleeding.   °· You develop a rash.   °· You have bloody or black, tarry stools.   °· You faint.   °· You vomit up blood.   °· You vomit repeatedly.   °· You have abdominal pain. °· You have a fever or persistent symptoms for  more than 2-3 days.   °· You have a fever and your symptoms suddenly get worse.   °· You are dehydrated.   °MAKE SURE YOU: °· Understand these instructions. °· Will watch your condition. °· Will get help right away if you are not doing well or get worse. °  °This information is not intended to replace advice given to you by your health care provider. Make sure you discuss any questions you have with your health care provider. °  °Document Released: 01/23/2005 Document Revised: 08/18/2013 Document Reviewed: 06/11/2013 °Elsevier Interactive Patient Education ©2016 Elsevier Inc. ° ° °

## 2016-01-30 ENCOUNTER — Telehealth: Payer: Self-pay | Admitting: Cardiology

## 2016-01-30 NOTE — Telephone Encounter (Signed)
Kathleen Griffith is calling because she has a question about her Xarelto , She was taken off because she had some bleeding. Is having terrible pain in her hip , groin and thigh , which last all day . Have had doctors that want to start her on Ibuprofen but cant because she is on Xarelto. Wants to know can she stop taking the Xarelto for a little while until this pain is gone or they find out what is going on. Please Call   Thanks

## 2016-01-30 NOTE — Telephone Encounter (Signed)
PT  HAD  HIP INJECTION LAST WEEK   AND  FELT  GOOD  ON SAT  THEN  Sunday NOTED  PAIN   TO R HIP,GROIN, AND  THIGH PMD  WILL NOT START  PREDNISONE  DUE TO BEING  DIABETIC  AND  PT  CAN'T  TAKE  IBUPROFEN BECAUSE  HAS   RESTARTED  XARELTO  FOR  AFIB PT WANTS TO KNOW  IF  SHE  COULD HOLD  XARELTO  FOR  FEW  DAYS  AND  TAKE  IBUPROFEN AS  TYLENOL  HAS  NOT  HELPED  WILL FORWARD  TO  DR Stanford Breed  FOR  REVIEW

## 2016-01-31 NOTE — Telephone Encounter (Signed)
Ok to hold xarelto for 10-14 d but would not routinely stop and restart xarelto Kathleen Griffith

## 2016-01-31 NOTE — Telephone Encounter (Signed)
Left detailed message for pt of dr [name] recommendations.

## 2016-02-02 ENCOUNTER — Encounter (HOSPITAL_COMMUNITY)
Admission: RE | Admit: 2016-02-02 | Discharge: 2016-02-02 | Disposition: A | Discharge status: Home / Self Care | Payer: Medicare Other | Source: Ambulatory Visit | Attending: Gastroenterology | Admitting: Gastroenterology

## 2016-02-02 ENCOUNTER — Encounter (HOSPITAL_COMMUNITY): Payer: Self-pay

## 2016-02-02 VITALS — BP 117/53 | HR 49 | Temp 97.7°F | Resp 16

## 2016-02-02 DIAGNOSIS — D509 Iron deficiency anemia, unspecified: Secondary | ICD-10-CM | POA: Diagnosis not present

## 2016-02-02 MED ORDER — FERUMOXYTOL INJECTION 510 MG/17 ML
510.0000 mg | INTRAVENOUS | Status: AC
Start: 2016-02-02 — End: 2016-02-02
  Administered 2016-02-02: 510 mg via INTRAVENOUS
  Filled 2016-02-02: qty 17

## 2016-02-02 MED ORDER — SODIUM CHLORIDE 0.9 % IV SOLN
Freq: Once | INTRAVENOUS | Status: AC
  Administered 2016-02-02: 12:00:00 via INTRAVENOUS

## 2016-02-02 NOTE — Discharge Instructions (Signed)

## 2016-02-05 ENCOUNTER — Telehealth: Payer: Self-pay | Admitting: Cardiology

## 2016-02-05 NOTE — Telephone Encounter (Signed)
New message     FYI Pt talked to the nurse last week about holding xarelto inorder to take ibuprofen for pain.  She did not hold the xarelto because she did not take the ibuprofen. However, pt was in afib last night until about 7pm.  She felt bad all day yesterday.  She is not sure how long she was in afib but probably all day since she felt bad.  HR was 145 at 6:30-7:00pm yesterday.

## 2016-02-05 NOTE — Telephone Encounter (Signed)
Spoke with pt, after she went back into rhythm she felt fine. Episode lasted about all day Saturday. She just wanted to let know.

## 2016-02-16 ENCOUNTER — Telehealth: Payer: Self-pay | Admitting: Gastroenterology

## 2016-02-16 DIAGNOSIS — D649 Anemia, unspecified: Secondary | ICD-10-CM

## 2016-02-16 NOTE — Telephone Encounter (Signed)
Pt aware, order and reminder in epic.

## 2016-02-16 NOTE — Telephone Encounter (Signed)
Fax received for this patient's labs from Dr. Trudie Reed  Labs from 02/08/16 show CBC - Hgb 14.7, MCV 76 which is signficantly improved from draw last month where she was in the 11s  I would recommend we repeat it in 3 months to ensure stable. Can you let her know? thanks

## 2016-02-22 ENCOUNTER — Encounter: Payer: Self-pay | Admitting: Gastroenterology

## 2016-03-19 ENCOUNTER — Non-Acute Institutional Stay (SKILLED_NURSING_FACILITY): Payer: Medicare Other | Admitting: Internal Medicine

## 2016-03-19 DIAGNOSIS — J939 Pneumothorax, unspecified: Secondary | ICD-10-CM | POA: Diagnosis not present

## 2016-03-19 DIAGNOSIS — I10 Essential (primary) hypertension: Secondary | ICD-10-CM | POA: Diagnosis not present

## 2016-03-19 DIAGNOSIS — J9601 Acute respiratory failure with hypoxia: Secondary | ICD-10-CM | POA: Diagnosis not present

## 2016-03-19 DIAGNOSIS — E1151 Type 2 diabetes mellitus with diabetic peripheral angiopathy without gangrene: Secondary | ICD-10-CM

## 2016-03-19 DIAGNOSIS — I4891 Unspecified atrial fibrillation: Secondary | ICD-10-CM | POA: Diagnosis not present

## 2016-03-19 DIAGNOSIS — D509 Iron deficiency anemia, unspecified: Secondary | ICD-10-CM

## 2016-03-19 DIAGNOSIS — J80 Acute respiratory distress syndrome: Secondary | ICD-10-CM

## 2016-03-19 DIAGNOSIS — Z720 Tobacco use: Secondary | ICD-10-CM | POA: Diagnosis not present

## 2016-03-19 DIAGNOSIS — E785 Hyperlipidemia, unspecified: Secondary | ICD-10-CM | POA: Diagnosis not present

## 2016-03-19 DIAGNOSIS — K219 Gastro-esophageal reflux disease without esophagitis: Secondary | ICD-10-CM

## 2016-03-19 DIAGNOSIS — S2231XD Fracture of one rib, right side, subsequent encounter for fracture with routine healing: Secondary | ICD-10-CM

## 2016-03-20 ENCOUNTER — Encounter: Payer: Self-pay | Admitting: Internal Medicine

## 2016-03-20 DIAGNOSIS — J939 Pneumothorax, unspecified: Secondary | ICD-10-CM | POA: Insufficient documentation

## 2016-03-20 DIAGNOSIS — J80 Acute respiratory distress syndrome: Secondary | ICD-10-CM | POA: Insufficient documentation

## 2016-03-20 DIAGNOSIS — E119 Type 2 diabetes mellitus without complications: Secondary | ICD-10-CM

## 2016-03-20 DIAGNOSIS — I480 Paroxysmal atrial fibrillation: Secondary | ICD-10-CM | POA: Insufficient documentation

## 2016-03-20 DIAGNOSIS — Z794 Long term (current) use of insulin: Secondary | ICD-10-CM

## 2016-03-20 DIAGNOSIS — S2231XA Fracture of one rib, right side, initial encounter for closed fracture: Secondary | ICD-10-CM | POA: Insufficient documentation

## 2016-03-20 DIAGNOSIS — J9601 Acute respiratory failure with hypoxia: Secondary | ICD-10-CM | POA: Insufficient documentation

## 2016-03-20 DIAGNOSIS — IMO0001 Reserved for inherently not codable concepts without codable children: Secondary | ICD-10-CM | POA: Insufficient documentation

## 2016-03-20 DIAGNOSIS — K219 Gastro-esophageal reflux disease without esophagitis: Secondary | ICD-10-CM | POA: Insufficient documentation

## 2016-03-20 NOTE — Progress Notes (Signed)
MRN: [id number] Name: Kathleen Griffith  Sex: female Age: 75 y.o. DOB: 09/15/1941  [place] #:[facility]  Facility/Room:111 Level Of Care: SNF Provider: Inocencio Homes D Emergency Contacts: Extended Emergency Contact Information Primary Emergency Contact: Braley,Michael Address: [address] [place], [place] [address] [place] Phone: [phone] Relation: Spouse Secondary Emergency Contact: Teater,Christopher Address: [place], [place] [address] [place] Phone: [phone] Relation: Son  Code Status:   Allergies: Metoclopramide; Lyrica; and Metformin and related  Chief Complaint  Patient presents with  . New Admit To SNF    HPI: Patient is 75 y.o. female with PMR, AF, DM2, HTN who was admitted to [facility] from 3/9-21 for a fall resulting in 3 R rib fractures and and apical pneumothorax. Pt gradually developed respiratory failure and almost concomitantly developed AF with RVR and ARDS requiring IV diltiazem and antibiotics, steroids, lasix and BiPAP. She has improved to the point she can be admitted to SNF for generalized weakness but is still requiring O2 2L [place]. While at SNF pt will be followed for HTN, tx with lisinopril and metoprolol, DM, tx with LANTUS, AMARYL AND JARDIANCE, AND hld, TX WITH LIPITOR.  Past Medical History  Diagnosis Date  . Diabetes mellitus ([place])   . Hypertension   . Hyperlipidemia   . Atrial fibrillation ([place])   . Gastroparesis   . Fibromyalgia   . PMR (polymyalgia rheumatica) (HCC)   . PVD (peripheral vascular disease) ([place])   . Arthritis   . Melanoma ([place])   . Colon polyps   . Kidney stones   . Cataract     REMOVED BILATERAL  . Anemia     microcytic     Past Surgical History  Procedure Laterality Date  . Cesarean section    . Appendectomy    . Breast biopsy    . Colonoscopy    . Polypectomy    . Colonoscopy with propofol N/A 12/26/2015    Procedure:  COLONOSCOPY WITH PROPOFOL;  Surgeon: Manus Gunning, MD;  Location: [facility];  Service: Gastroenterology;  Laterality: N/A;  . Esophagogastroduodenoscopy (egd) with propofol N/A 12/26/2015    Procedure: ESOPHAGOGASTRODUODENOSCOPY (EGD) WITH PROPOFOL;  Surgeon: Manus Gunning, MD;  [facility];  Service: Gastroenterology;  Laterality: N/A;      Medication List       This list is accurate as of: 03/19/16 11:59 PM.  Always use your most recent med list.               acetaminophen 500 MG tablet  Commonly known as:  TYLENOL  Take 1,000 mg by mouth every 6 (six) hours as needed for mild pain.     atorvastatin 40 MG tablet  Commonly known as:  LIPITOR  Take 40 mg by mouth daily.     CENTRUM SILVER PO  Take 1 tablet by mouth daily.     docusate sodium 100 MG capsule  Commonly known as:  COLACE  Take 200 mg by mouth 2 (two) times daily.     empagliflozin 25 MG Tabs tablet  Commonly known as:  JARDIANCE  Take 25 mg by mouth daily.     gabapentin 300 MG capsule  Commonly known as:  NEURONTIN  Take 300 mg by mouth 2 (two) times daily.     glimepiride 4 MG tablet  Commonly known  as:  AMARYL  Take 8 mg by mouth daily.     HYDROcodone-acetaminophen 5-325 MG tablet  Commonly known as:  NORCO/VICODIN  Take 1 tablet by mouth every 4 (four) hours as needed for moderate pain.     insulin glargine 100 UNIT/ML injection  Commonly known as:  LANTUS  Inject 15 Units into the skin at bedtime.     iron polysaccharides 150 MG capsule  Commonly known as:  NIFEREX  Take 150 mg by mouth 2 (two) times daily.     metoprolol tartrate 25 MG tablet  Commonly known as:  LOPRESSOR  Take 1 tablet (25 mg total) by mouth 2 (two) times daily.     Na Sulfate-K Sulfate-Mg Sulf Soln  Take as directed per Colonoscopy.     omeprazole 20 MG capsule  Commonly known as:  PRILOSEC  Take 20 mg by mouth 2 (two) times daily before a meal.     oxyCODONE-acetaminophen 5-325  MG tablet  Commonly known as:  PERCOCET/ROXICET  Take 2 tablets by mouth every 4 (four) hours as needed for severe pain.     polycarbophil 625 MG tablet  Commonly known as:  FIBERCON  Take 1,250 mg by mouth daily.     rivaroxaban 20 MG Tabs tablet  Commonly known as:  XARELTO  Take 20 mg by mouth daily with supper.     sertraline 50 MG tablet  Commonly known as:  ZOLOFT  Take 100 mg by mouth daily.     vitamin B-12 1000 MCG tablet  Commonly known as:  CYANOCOBALAMIN  Take 1,000 mcg by mouth daily.     Vitamin D3 1000 units Caps  Take 1 tablet by mouth daily.        No orders of the defined types were placed in this encounter.     There is no immunization history on file for this patient.  Social History  Substance Use Topics  . Smoking status: Current Every Day Smoker -- 1.00 packs/day    Types: Cigarettes  . Smokeless tobacco: Never Used  . Alcohol Use: No    Family history is +dm2, htn   Review of Systems  DATA OBTAINED: from patient, nurse GENERAL:  no fevers, fatigue, appetite changes; PT NEEDS SOMETHING FOR PAIN SKIN: No itching, rash or wounds EYES: No eye pain, redness, discharge EARS: No earache, tinnitus, change in hearing NOSE: No congestion, drainage or bleeding  MOUTH/THROAT: No mouth or tooth pain, No sore throat RESPIRATORY: No cough, wheezing, SOB CARDIAC: No chest pain, palpitations, lower extremity edema  GI: No abdominal pain, No N/V/D or constipation, No heartburn or reflux  GU: No dysuria, frequency or urgency, or incontinence  MUSCULOSKELETAL: No unrelieved bone/joint pain NEUROLOGIC: No headache, dizziness or focal weakness PSYCHIATRIC: No c/o anxiety or sadness   Filed Vitals:   03/20/16 2020  BP: 106/50  Pulse: 54  Temp: 97.5 F (36.4 C)  Resp: 20    SpO2 Readings from Last 1 Encounters:  02/02/16 97%        Physical Exam  GENERAL APPEARANCE: Alert, conversant,  No acute distress.  SKIN: No diaphoresis rash HEAD:  Normocephalic, atraumatic  EYES: Conjunctiva/lids clear. Pupils round, reactive. EOMs intact.  EARS: External exam WNL, canals clear. Hearing grossly normal.  NOSE: No deformity or discharge.  MOUTH/THROAT: Lips w/o lesions  RESPIRATORY: Breathing is even, unlabored. Lung sounds are clear   CARDIOVASCULAR: Heart RRR no murmurs, rubs or gallops. No peripheral edema.   GASTROINTESTINAL: Abdomen is soft, non-tender,  not distended w/ normal bowel sounds. GENITOURINARY: Bladder non tender, not distended  MUSCULOSKELETAL: No abnormal joints or musculature NEUROLOGIC:  Cranial nerves 2-12 grossly intact. Moves all extremities  PSYCHIATRIC: Mood and affect appropriate to situation, no behavioral issues  Patient Active Problem List   Diagnosis Date Noted  . Fracture of rib of right side 03/20/2016  . Acute respiratory failure with hypoxia ([place]) 03/20/2016  . Atrial fibrillation with RVR ([place]) 03/20/2016  . Pneumothorax on right 03/20/2016  . ARDS (adult respiratory distress syndrome) ([place]) 03/20/2016  . DM (diabetes mellitus) type II controlled peripheral vascular disorder ([place]) 03/20/2016  . GERD (gastroesophageal reflux disease) 03/20/2016  . Dysphagia   . Abdominal pain, epigastric   . Gastric polyp   . Anemia, iron deficiency   . Angiodysplasia of cecum   . Colon polyp   . 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 Chronic anticoagulation 07/28/2015  . Family history of colon cancer 07/28/2015  . CN (constipation) 07/28/2015  . Paroxysmal atrial fibrillation ([place]) 04/04/2015  . Peripheral vascular disease ([place]) 04/04/2015  . Tobacco abuse 04/04/2015  . Essential hypertension 04/04/2015  . Hyperlipidemia 04/04/2015    CBC    Component Value Date/Time   WBC 9.0 12/29/2015 1122   RBC 5.18* 12/29/2015 1122   HGB 11.5* 12/29/2015 1122   HCT 38.0 12/29/2015 1122   PLT 425.0* 12/29/2015 1122   MCV 73.4* 12/29/2015 1122   LYMPHSABS 1.8 12/29/2015 1122   MONOABS 0.6 12/29/2015 1122    EOSABS 0.2 12/29/2015 1122   BASOSABS 0.2* 12/29/2015 1122    CMP     Component Value Date/Time   NA 136 11/27/2015 1510   K 4.8 11/27/2015 1510   CL 101 11/27/2015 1510   CO2 23 11/27/2015 1510   GLUCOSE 180* 11/27/2015 1510   BUN 29* 11/27/2015 1510   CREATININE 1.01* 11/27/2015 1510   CALCIUM 9.2 11/27/2015 1510   GFRNONAA 65 04/04/2015 1449   GFRAA 75 04/04/2015 1449    No results found for: HGBA1C   No results found.  Not all labs, radiology exams or other studies done during hospitalization come through on my EPIC note; however they are reviewed by me.    Assessment and Plan  Fracture of rib of right side r POSTERIOR RIB FX x 3 S/P FALL AS CONTRIBUTING FACTOR TO PNEUMOTHORAX, AND ards; snf = PLAN START PT ON SOME OXYCODONE for pain, cont o2  Acute respiratory failure with hypoxia (HCC) Developed over several days after rib fx; o2 was upped to Bipap and pt d/c back on o2 SNF - plan wean as possible  Atrial fibrillation with RVR (HCC) Developed several days into hospitalization; tx with IV dilt, then po cardiazem and metoprolol with resolution; SNF =- pt d/c on metoprolol for rate and xarelto as prophylaxis  Pneumothorax on right 2/2 rib fx; at one pont chest tube was to be placed but pt deveoped AF with RVR so was postponed;resolved spontaneously  ARDS (adult respiratory distress syndrome) (HCC) Unknown cause; tx with antibiotics, steroids lasix and Bipap with resolution; SNF - cnt O2 and wean as possible  Essential hypertension SNF - reported as stable- cont metoprolol and lasix and lisinopril; will f/u with BMP  Hyperlipidemia SNF - not stated as uncontrolled ;cont lipitor 40 mg dailyu  DM (diabetes mellitus) type II controlled peripheral vascular disorder ([place]) SNF - not stated as uncontrolled ; contiue lantus 16 u nightly, jardiance and amaryl; check BS q am; pt in ACE and statin  GERD (gastroesophageal reflux  disease) SNF - cont omeprazole 40 mg  daily  Anemia, iron deficiency SNF - d/c Hb 11.9; cont daily iron; f/u CBc  Tobacco abuse SNF - pt has asked for nicorette lozenges and they have been ordered; she took them in hospital but not on d/c med list   Time spent > 45 min;> 50% of time with patient was spent reviewing records, labs, tests and studies, counseling and developing plan of care  Hennie Duos, MD

## 2016-03-20 NOTE — Assessment & Plan Note (Signed)
Developed over several days after rib fx; o2 was upped to Bipap and pt d/c back on o2 SNF - plan wean as possible

## 2016-03-20 NOTE — Assessment & Plan Note (Signed)
SNF - reported as stable- cont metoprolol and lasix and lisinopril; will f/u with BMP

## 2016-03-20 NOTE — Assessment & Plan Note (Signed)
SNF - not stated as uncontrolled ;cont lipitor 40 mg dailyu

## 2016-03-20 NOTE — Assessment & Plan Note (Signed)
SNF - cont omeprazole 40 mg daily

## 2016-03-20 NOTE — Assessment & Plan Note (Signed)
2/2 rib fx; at one pont chest tube was to be placed but pt deveoped AF with RVR so was postponed;resolved spontaneously

## 2016-03-20 NOTE — Assessment & Plan Note (Signed)
Unknown cause; tx with antibiotics, steroids lasix and Bipap with resolution; SNF - cnt O2 and wean as possible

## 2016-03-20 NOTE — Assessment & Plan Note (Signed)
SNF - pt has asked for nicorette lozenges and they have been ordered; she took them in hospital but not on d/c med list

## 2016-03-20 NOTE — Assessment & Plan Note (Signed)
Developed several days into hospitalization; tx with IV dilt, then po cardiazem and metoprolol with resolution; SNF =- pt d/c on metoprolol for rate and xarelto as prophylaxis

## 2016-03-20 NOTE — Assessment & Plan Note (Signed)
r POSTERIOR RIB FX x 3 S/P FALL AS CONTRIBUTING FACTOR TO PNEUMOTHORAX, AND ards; snf = PLAN START PT ON SOME OXYCODONE for pain, cont o2

## 2016-03-20 NOTE — Assessment & Plan Note (Signed)
SNF - not stated as uncontrolled ; contiue lantus 16 u nightly, jardiance and amaryl; check BS q am; pt in ACE and statin

## 2016-03-20 NOTE — Assessment & Plan Note (Signed)
SNF - d/c Hb 11.9; cont daily iron; f/u CBc

## 2016-03-26 ENCOUNTER — Telehealth: Payer: Self-pay | Admitting: *Deleted

## 2016-03-26 NOTE — Telephone Encounter (Signed)
Spoke with pt, she recently was admitted to [facility]. She reports going in and out of atrial fib. She wanted to let us know what has been going on. She is in rehab now and doing better.

## 2016-03-27 ENCOUNTER — Telehealth: Payer: Self-pay | Admitting: Cardiology

## 2016-03-27 NOTE — Telephone Encounter (Signed)
Received records from [facility] as requested by Dr Stanford Breed.  Records given to Dr Stanford Breed to review. lp

## 2016-03-27 NOTE — Telephone Encounter (Signed)
Faxed Released signed by patient to [facility] to obtain records per Dr Stanford Breed request.  Faxed on 03/27/16. lp

## 2016-03-28 ENCOUNTER — Encounter: Payer: Self-pay | Admitting: Internal Medicine

## 2016-03-28 ENCOUNTER — Non-Acute Institutional Stay (SKILLED_NURSING_FACILITY): Payer: Medicare Other | Admitting: Internal Medicine

## 2016-03-28 DIAGNOSIS — J9601 Acute respiratory failure with hypoxia: Secondary | ICD-10-CM | POA: Diagnosis not present

## 2016-03-28 DIAGNOSIS — D509 Iron deficiency anemia, unspecified: Secondary | ICD-10-CM | POA: Diagnosis not present

## 2016-03-28 DIAGNOSIS — E1151 Type 2 diabetes mellitus with diabetic peripheral angiopathy without gangrene: Secondary | ICD-10-CM

## 2016-03-28 DIAGNOSIS — I480 Paroxysmal atrial fibrillation: Secondary | ICD-10-CM | POA: Diagnosis not present

## 2016-03-28 NOTE — Progress Notes (Signed)
Patient ID: [name], female   DOB: October 23, 1941, 75 y.o.   MRN: [id number]  MRN: [id number] Name: Kathleen Griffith  Sex: female Age: 75 y.o. DOB: 01/22/1941  [place] [facility]  Facility/Room:111 Level Of Care: SNF Provider: Wille Celeste Emergency Contacts: Extended Emergency Contact Information Primary Emergency Contact: Jaime,Michael Address: [address] [place], [place] [address] [place] Phone: [phone] Relation: Spouse Secondary Emergency Contact: Okerlund,Christopher Address: [place], [place] [address] [place] Phone: [phone] Relation: Son  Code Status:   Allergies: Metoclopramide; Lyrica; and Metformin and related  Chief Complaint  Patient presents with  . Discharge Note    HPI: Patient is 75 y.o. female with PMR, AF, DM2, HTN who was admitted to [facility] from 3/9-21 for a fall resulting in 3 R rib fractures and and apical pneumothorax. Pt gradually developed respiratory failure and almost concomitantly developed AF with RVR and ARDS requiring IV diltiazem and antibiotics, steroids, lasix and BiPAP. She  improved to the point she was admitted to SNF for generalized weakness While at SNF pt was  followed for HTN, tx with lisinopril and metoprolol, DM, tx with LANTUS, AMARYL AND JARDIANCE, AND hld, TX WITH LIPITOR. She has done quite well rehabbing here-her blood sugars range from 112 up to 220 recently-she has gained strength she is using a walker now and doing quite well with that.  She will be going home with home health PT and OT for further strengthening.  She has no complaints today.  Atrial fibrillation appears to be rate controlled she is on metoprolol--continues on Xarelto for anticoagulation.  Hemoglobin appears to be stable at 13.2 on lab done on March 28 area  Her creatinine has risen slightly up to 1.35 will write an order to have home health recheck this  first laboratory day next week and notify primary care provider of results  Past Medical History  Diagnosis Date  . Diabetes mellitus ([place])   . Hypertension   . Hyperlipidemia   . Atrial fibrillation ([place])   . Gastroparesis   . Fibromyalgia   . PMR (polymyalgia rheumatica) (HCC)   . PVD (peripheral vascular disease) ([place])   . Arthritis   . Melanoma ([place])   . Colon polyps   . Kidney stones   . Cataract     REMOVED BILATERAL  . Anemia     microcytic     Past Surgical History  Procedure Laterality Date  . Cesarean section    . Appendectomy    . Breast biopsy    . Colonoscopy    . Polypectomy    . Colonoscopy with propofol N/A 12/26/2015    Procedure: COLONOSCOPY WITH PROPOFOL;  Surgeon: Manus Gunning, MD;  [facility];  Service: Gastroenterology;  Laterality: N/A;  . Esophagogastroduodenoscopy (egd) with propofol N/A 12/26/2015    Procedure: ESOPHAGOGASTRODUODENOSCOPY (EGD) WITH PROPOFOL;  Surgeon: Manus Gunning, MD;  [facility];  Service: Gastroenterology;  Laterality: N/A;      Medication List       This list is accurate as of: 03/28/16 11:59 PM.  Always use your most recent med list.               acetaminophen 500 MG tablet  Commonly known as:  TYLENOL  Take 1,000 mg by mouth every 6 (six) hours as needed  for mild pain.     atorvastatin 40 MG tablet  Commonly known as:  LIPITOR  Take 40 mg by mouth daily.     CENTRUM SILVER PO  Take 1 tablet by mouth daily.     docusate sodium 100 MG capsule  Commonly known as:  COLACE  Take 200 mg by mouth 2 (two) times daily.     empagliflozin 25 MG Tabs tablet  Commonly known as:  JARDIANCE  Take 25 mg by mouth daily.     gabapentin 300 MG capsule  Commonly known as:  NEURONTIN  Take 300 mg by mouth 2 (two) times daily.     glimepiride 4 MG tablet  Commonly known as:  AMARYL  Take 8 mg by mouth daily.     insulin glargine 100 UNIT/ML injection  Commonly known as:   LANTUS  Inject 16 Units into the skin at bedtime.     iron polysaccharides 150 MG capsule  Commonly known as:  NIFEREX  Take 150 mg by mouth 2 (two) times daily.     metoprolol tartrate 25 MG tablet  Commonly known as:  LOPRESSOR  Take 1 tablet (25 mg total) by mouth 2 (two) times daily.     omeprazole 20 MG capsule  Commonly known as:  PRILOSEC  Take 20 mg by mouth 2 (two) times daily before a meal.     oxyCODONE-acetaminophen 5-325 MG tablet  Commonly known as:  PERCOCET/ROXICET  Take 2 tablets by mouth every 4 (four) hours as needed for severe pain.     polycarbophil 625 MG tablet  Commonly known as:  FIBERCON  Take 1,250 mg by mouth daily.     rivaroxaban 20 MG Tabs tablet  Commonly known as:  XARELTO  Take 20 mg by mouth daily with supper.     sertraline 50 MG tablet  Commonly known as:  ZOLOFT  Take 100 mg by mouth daily.     vitamin B-12 1000 MCG tablet  Commonly known as:  CYANOCOBALAMIN  Take 1,000 mcg by mouth daily.     Vitamin D3 1000 units Caps  Take 1 tablet by mouth daily.        No orders of the defined types were placed in this encounter.     There is no immunization history on file for this patient.  Social History  Substance Use Topics  . Smoking status: Current Every Day Smoker -- 1.00 packs/day    Types: Cigarettes  . Smokeless tobacco: Never Used  . Alcohol Use: No    Family history is +dm2, htn   Review of Systems  DATA OBTAINED: from patient, nurse GENERAL:  no fevers, fatigue, appetite changes;Pain at this point appears to be controlled on oxycodone as needed  SKIN: No itching, rash or wounds EYES: No eye pain, redness, discharge EARS: No earache, tinnitus, change in hearing NOSE: No congestion, drainage or bleeding  MOUTH/THROAT: No mouth or tooth pain, No sore throat RESPIRATORY: No cough, wheezing, SOB CARDIAC: No chest pain, palpitations, lower extremity edema  GI: No abdominal pain, No N/V/D or constipation, No  heartburn or reflux  GU: No dysuria, frequency or urgency, or incontinence  MUSCULOSKELETAL: No unrelieved bone/joint pain NEUROLOGIC: No headache, dizziness or focal weakness PSYCHIATRIC: No c/o anxiety or sadness   Filed Vitals:   03/28/16 2232  BP: 108/52  Pulse: 60  Temp: 97 F (36.1 C)  Resp: 16    SpO2 Readings from Last 1 Encounters:  02/02/16 97%  Physical Exam  GENERAL APPEARANCE: Alert, conversant,  No acute distress.  SKIN: No diaphoresis rash HEAD: Normocephalic, atraumatic  EYES: Conjunctiva/lids clear. Pupils round, reactive. EOMs intact.  EARS: External exam WNL, canals clear. Hearing grossly normal.  NOSE: No deformity or discharge.  MOUTH/THROAT: Lips w/o lesions  RESPIRATORY: Breathing is even, unlabored. Lung sounds are clear   CARDIOVASCULAR: Heart RRR no murmurs, rubs or gallops. No peripheral edema.   GASTROINTESTINAL: Abdomen is soft, non-tender, not distended w/ normal bowel sounds. GENITOURINARY: Bladder non tender, not distended  MUSCULOSKELETAL: No abnormal joints or musculature using a rolling walker  NEUROLOGIC:  Cranial nerves 2-12 grossly intact. Moves all extremities  PSYCHIATRIC: Mood and affect appropriate to situation, no behavioral issues continues to be pleasant and appropriate follows her medical issues closely  Patient Active Problem List   Diagnosis Date Noted  . Fracture of rib of right side 03/20/2016  . Acute respiratory failure with hypoxia ([place]) 03/20/2016  . Atrial fibrillation with RVR ([place]) 03/20/2016  . Pneumothorax on right 03/20/2016  . ARDS (adult respiratory distress syndrome) ([place]) 03/20/2016  . DM (diabetes mellitus) type II controlled peripheral vascular disorder ([place]) 03/20/2016  . GERD (gastroesophageal reflux disease) 03/20/2016  . Dysphagia   . Abdominal pain, epigastric   . Gastric polyp   . Anemia, iron deficiency   . Angiodysplasia of cecum   . Colon polyp   . Cerebrovascular disease 11/27/2015   . Chronic anticoagulation 07/28/2015  . Family history of colon cancer 07/28/2015  . CN (constipation) 07/28/2015  . Paroxysmal atrial fibrillation ([place]) 04/04/2015  . Peripheral vascular disease ([place]) 04/04/2015  . Tobacco abuse 04/04/2015  . Essential hypertension 04/04/2015  . Hyperlipidemia 04/04/2015   03/26/2016.  WBC 7.5 hemoglobin 13.2 platelets 369.  Sodium 138 potassium 3.9 BUN 26 creatinine 1.35 CBC    Component Value Date/Time   WBC 9.0 12/29/2015 1122   RBC 5.18* 12/29/2015 1122   HGB 11.5* 12/29/2015 1122   HCT 38.0 12/29/2015 1122   PLT 425.0* 12/29/2015 1122   MCV 73.4* 12/29/2015 1122   LYMPHSABS 1.8 12/29/2015 1122   MONOABS 0.6 12/29/2015 1122   EOSABS 0.2 12/29/2015 1122   BASOSABS 0.2* 12/29/2015 1122    CMP     Component Value Date/Time   NA 136 11/27/2015 1510   K 4.8 11/27/2015 1510   CL 101 11/27/2015 1510   CO2 23 11/27/2015 1510   GLUCOSE 180* 11/27/2015 1510   BUN 29* 11/27/2015 1510   CREATININE 1.01* 11/27/2015 1510   CALCIUM 9.2 11/27/2015 1510   GFRNONAA 65 04/04/2015 1449   GFRAA 75 04/04/2015 1449            Assessment and Plan History of pneumothorax with right rib fractures-happears to have recovered quite well from this respiratory wise has been stable she is on oxycodone for pain  Acute respiratory failure again sought secondary to #1-this has stabilized.  #3 history of atrial fibrillation continues on multiple below for rate control as well as Xarelto for anticoagulation-was treated in the hospital with diltiazem and Lopressor-this is been stable occasionally bradycardic readings but these are not frequent or symptomatic and patient has discussed this with her cardiologist-he would prefer heart rate be a bit slow per what patient tells me-as long as she is not symptomatic.  #4 history pneumothorax apparently this resolved spontaneously per review of discharge summary.  #5 history hypertension this appears to be  relatively stable on metoprolol Lasix and lisinopril--at timeshe has slightly low readings with systolics  in the 90s but these are not consistent-she does monitor this closely and does not take her blood pressure medicines if thought to be too low again she has been doing this apparently for some time with stability  #6 hyperlipidemia continues on Lipitor.  #7 history diabetes type 2 continues on Lantus as Jardiance and Amaryl-this appears to be relatively stable  #8-history of adult respiratory distress syndrome again there is been no reoccurrence per hospital evaluation possibly an unknown cause but it did respond antibiotic steroids Lasix and BiPAP.  #9 history of GERD this has been stable on Prilosec.  And #10 history of anemia improved with iron supplementation-hemoglobin 13.2 on March 28 again this will need updating by home health early next week primary care provider notified of results  #11-history renal insufficiency creatinine has risen a bit up to 1.35 will have this redrawn early next week by home health and primary care provider notified of results clinically she appears stable  Again patient will be going home she will be with her husband who apparently has care needs-she will receive home health support as well as PT and OT for strengthening she also will need a rolling walker.  [id number] note greater than 30 minutes spent on this discharge summary-greater than 50% of time spent coordinating plan of care for numerous diagnoses    LASSEN, ARLO C,

## 2016-04-01 ENCOUNTER — Telehealth: Payer: Self-pay | Admitting: Cardiology

## 2016-04-01 ENCOUNTER — Telehealth: Payer: Self-pay | Admitting: Gastroenterology

## 2016-04-01 NOTE — Telephone Encounter (Signed)
Patient calling to let MD know that she has not forgotten about her labs that are due. She fell and was in ICU then rehab. Her las HGB in rehab will 11. She will be having labs done this week and will call us with the results.

## 2016-04-01 NOTE — Telephone Encounter (Signed)
Needs an appt for May. She is not available the 1st week in May.

## 2016-04-01 NOTE — Telephone Encounter (Signed)
Ok, thanks for letting me know!

## 2016-05-13 ENCOUNTER — Telehealth: Payer: Self-pay | Admitting: *Deleted

## 2016-05-13 NOTE — Telephone Encounter (Signed)
-----   Message from Algernon Huxley, RN sent at 02/16/2016  8:54 AM EST ----- Regarding: cbc Pt needs repeat CBC in May, order in epic.

## 2016-05-13 NOTE — Telephone Encounter (Signed)
Patient will come here for a lab draw. She is not sure where she had the last CBC since she was  inpt and in rehab also.

## 2016-05-13 NOTE — Telephone Encounter (Signed)
Spoke with patient and she states she had a lab done recently and her Hgb with 14. She reports it was to be faxed to Dr. Havery Moros. Have you seen this report?

## 2016-05-13 NOTE — Telephone Encounter (Signed)
No the last I have seen in Epic was from 3/17 with Hgb of 11.9. If she has had one since then can we get a copy of it? Thanks

## 2016-05-20 NOTE — Progress Notes (Signed)
HPI: FU atrial fibrillation. She has a history of paroxysmal atrial fibrillation. She was treated in [place] previously. She had negative nuclear study approximately 10 years ago. She moved here in July 2015. Abdominal ultrasound May 2016 showed no aneurysm. Echocardiogram May 2016 showed normal LV function. Mild left ventricular hypertrophy. 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 ABIs November 2016 normal. Noted to be anemic in January 2017 felt secondary to gastritis. Patient admitted in [facility] in [facility] 2017 after fall. She had a pneumothorax and her course was complicated by ARDS and atrial fibrillation. Since last seen,   Current Outpatient Prescriptions  Medication Sig Dispense Refill  . acetaminophen (TYLENOL) 500 MG tablet Take 1,000 mg by mouth every 6 (six) hours as needed for mild pain.     Marland Kitchen atorvastatin (LIPITOR) 40 MG tablet Take 40 mg by mouth daily.    . Cholecalciferol (VITAMIN D3) 1000 UNITS CAPS Take 1 tablet by mouth daily.    Marland Kitchen docusate sodium (COLACE) 100 MG capsule Take 200 mg by mouth 2 (two) times daily.    . empagliflozin (JARDIANCE) 25 MG TABS tablet Take 25 mg by mouth daily.    Marland Kitchen gabapentin (NEURONTIN) 300 MG capsule Take 300 mg by mouth 2 (two) times daily.    Marland Kitchen glimepiride (AMARYL) 4 MG tablet Take 8 mg by mouth daily.    . insulin glargine (LANTUS) 100 UNIT/ML injection Inject 16 Units into the skin at bedtime.     . iron polysaccharides (NIFEREX) 150 MG capsule Take 150 mg by mouth 2 (two) times daily.    . metoprolol tartrate (LOPRESSOR) 25 MG tablet Take 1 tablet (25 mg total) by mouth 2 (two) times daily. 180 tablet 3  . Multiple Vitamins-Minerals (CENTRUM SILVER PO) Take 1 tablet by mouth daily.    Marland Kitchen omeprazole (PRILOSEC) 20 MG capsule Take 20 mg by mouth 2 (two) times daily before a meal.     . oxyCODONE-acetaminophen (PERCOCET/ROXICET) 5-325 MG tablet Take 2 tablets by mouth every 4 (four) hours as needed for severe pain. 16 tablet 0   . polycarbophil (FIBERCON) 625 MG tablet Take 1,250 mg by mouth daily.    . rivaroxaban (XARELTO) 20 MG TABS tablet Take 20 mg by mouth daily with supper.    . sertraline (ZOLOFT) 50 MG tablet Take 100 mg by mouth daily.    . vitamin B-12 (CYANOCOBALAMIN) 1000 MCG tablet Take 1,000 mcg by mouth daily.     No current facility-administered medications for this visit.     Past Medical History  Diagnosis Date  . Diabetes mellitus ([place])   . Hypertension   . Hyperlipidemia   . Atrial fibrillation ([place])   . Gastroparesis   . Fibromyalgia   . PMR (polymyalgia rheumatica) (HCC)   . PVD (peripheral vascular disease) ([place])   . Arthritis   . Melanoma ([place])   . Colon polyps   . Kidney stones   . Cataract     REMOVED BILATERAL  . Anemia     microcytic     Past Surgical History  Procedure Laterality Date  . Cesarean section    . Appendectomy    . Breast biopsy    . Colonoscopy    . Polypectomy    . Colonoscopy with propofol N/A 12/26/2015    Procedure: COLONOSCOPY WITH PROPOFOL;  Surgeon: Manus Gunning, MD;  Location: [facility];  Service: Gastroenterology;  Laterality: N/A;  . Esophagogastroduodenoscopy (egd) with propofol N/A 12/26/2015  Procedure: ESOPHAGOGASTRODUODENOSCOPY (EGD) WITH PROPOFOL;  Surgeon: Manus Gunning, MD;  Location: [facility];  Service: Gastroenterology;  Laterality: N/A;    Social History   Social History  . Marital Status: Married    Spouse Name: N/A  . Number of Children: 1  . Years of Education: N/A   Occupational History  . Not on file.   Social History Main Topics  . Smoking status: Current Every Day Smoker -- 1.00 packs/day    Types: Cigarettes  . Smokeless tobacco: Never Used  . Alcohol Use: No  . Drug Use: No  . Sexual Activity: Not on file   Other Topics Concern  . Not on file   Social History Narrative    Family History  Problem Relation Age of Onset  . CAD Father     MI and CABG  . Diabetes Father   .  Heart disease Father   . Colon cancer Mother 45    ROS: no fevers or chills, productive cough, hemoptysis, dysphasia, odynophagia, melena, hematochezia, dysuria, hematuria, rash, seizure activity, orthopnea, PND, pedal edema, claudication. Remaining systems are negative.  Physical Exam: Well-developed well-nourished in no acute distress.  Skin is warm and dry.  HEENT is normal.  Neck is supple.  Chest is clear to auscultation with normal expansion.  Cardiovascular exam is regular rate and rhythm.  Abdominal exam nontender or distended. No masses palpated. Extremities show no edema. neuro grossly intact  ECG     This encounter was created in error - please disregard.

## 2016-05-21 ENCOUNTER — Encounter: Payer: Medicare Other | Admitting: Cardiology

## 2016-05-29 ENCOUNTER — Other Ambulatory Visit (INDEPENDENT_AMBULATORY_CARE_PROVIDER_SITE_OTHER): Payer: Medicare Other

## 2016-05-29 DIAGNOSIS — D649 Anemia, unspecified: Secondary | ICD-10-CM

## 2016-05-29 LAB — CBC WITH DIFFERENTIAL/PLATELET
BASOS PCT: 0.8 % (ref 0.0–3.0)
Basophils Absolute: 0.1 10*3/uL (ref 0.0–0.1)
EOS PCT: 3.4 % (ref 0.0–5.0)
Eosinophils Absolute: 0.3 10*3/uL (ref 0.0–0.7)
HCT: 44.3 % (ref 36.0–46.0)
Hemoglobin: 14.9 g/dL (ref 12.0–15.0)
LYMPHS ABS: 2 10*3/uL (ref 0.7–4.0)
Lymphocytes Relative: 20.7 % (ref 12.0–46.0)
MCHC: 33.8 g/dL (ref 30.0–36.0)
MCV: 89.3 fl (ref 78.0–100.0)
MONO ABS: 0.7 10*3/uL (ref 0.1–1.0)
MONOS PCT: 6.9 % (ref 3.0–12.0)
NEUTROS ABS: 6.6 10*3/uL (ref 1.4–7.7)
NEUTROS PCT: 68.2 % (ref 43.0–77.0)
Platelets: 346 10*3/uL (ref 150.0–400.0)
RBC: 4.95 Mil/uL (ref 3.87–5.11)
RDW: 15.4 % (ref 11.5–15.5)
WBC: 9.6 10*3/uL (ref 4.0–10.5)

## 2016-05-30 ENCOUNTER — Other Ambulatory Visit: Payer: Self-pay | Admitting: *Deleted

## 2016-05-30 DIAGNOSIS — D509 Iron deficiency anemia, unspecified: Secondary | ICD-10-CM

## 2016-06-04 ENCOUNTER — Telehealth: Payer: Self-pay | Admitting: Cardiology

## 2016-06-04 NOTE — Telephone Encounter (Signed)
New Message  Pt called states that she is moving at the end of this month and would like to have one more office visit with Dr. Stanford Breed before she leaves. She is asking to be fit in on one of the last days of June. Please assist. Pt is on Waiting list as well.

## 2016-06-05 NOTE — Telephone Encounter (Signed)
Will watch for any cancellations.

## 2016-07-04 NOTE — Progress Notes (Signed)
HPI: FU atrial fibrillation. She has a history of paroxysmal atrial fibrillation. She was treated in [place] previously. She had negative nuclear study approximately 10 years ago. She moved here in July 2015. Abdominal ultrasound May 2016 showed no aneurysm. 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 ABIs November 2016 normal. Patient was admitted to [facility] in March 2017 after a fall resulting in 3 rib fractures and apical pneumothorax. Her course was complicated by respiratory failure, ARDS and atrial fibrillation. Echocardiogram March 2017 showed normal LV systolic function. Since last seen, She denies dyspnea on exertion, orthopnea, PND, pedal edema, chest pain or syncope. In the past 2 weeks she has had 2 separate episodes of atrial fibrillation which she feels increased dyspnea on exertion and jaw pain.   Current Outpatient Prescriptions  Medication Sig Dispense Refill  . acetaminophen (TYLENOL) 500 MG tablet Take 1,000 mg by mouth every 6 (six) hours as needed for mild pain.     . Cholecalciferol (VITAMIN D3) 1000 UNITS CAPS Take 1 tablet by mouth daily.    Marland Kitchen docusate sodium (COLACE) 100 MG capsule Take 100 mg by mouth at bedtime as needed.     . empagliflozin (JARDIANCE) 25 MG TABS tablet Take 25 mg by mouth daily.    Marland Kitchen gabapentin (NEURONTIN) 300 MG capsule Take 300 mg by mouth 3 (three) times daily.     Marland Kitchen glimepiride (AMARYL) 4 MG tablet Take 8 mg by mouth daily.    . insulin glargine (LANTUS) 100 UNIT/ML injection Inject 16 Units into the skin at bedtime.     . metoprolol tartrate (LOPRESSOR) 25 MG tablet Take 1 tablet (25 mg total) by mouth 2 (two) times daily. 180 tablet 3  . Multiple Vitamins-Minerals (CENTRUM SILVER PO) Take 1 tablet by mouth daily.    . polycarbophil (FIBERCON) 625 MG tablet Take 1,250 mg by mouth daily.    . rivaroxaban (XARELTO) 20 MG TABS tablet Take 20 mg by mouth daily with supper.    . rosuvastatin (CRESTOR) 20 MG tablet Take 20  mg by mouth daily. Once every three days.    Marland Kitchen sertraline (ZOLOFT) 50 MG tablet Take 100 mg by mouth daily.    . vitamin B-12 (CYANOCOBALAMIN) 1000 MCG tablet Take 1,000 mcg by mouth daily.     No current facility-administered medications for this visit.     Past Medical History  Diagnosis Date  . Diabetes mellitus ([place])   . Hypertension   . Hyperlipidemia   . Atrial fibrillation ([place])   . Gastroparesis   . Fibromyalgia   . PMR (polymyalgia rheumatica) (H[facility])   . PVD (peripheral vascular disease) ([place])   . Arthritis   . Melanoma ([place])   . Colon polyps   . Kidney stones   . Cataract     REMOVED BILATERAL  . Anemia     microcytic     Past Surgical History  Procedure Laterality Date  . Cesarean section    . Appendectomy    . Breast biopsy    . Colonoscopy    . Polypectomy    . Colonoscopy with propofol N/A 12/26/2015    Procedure: COLONOSCOPY WITH PROPOFOL;  Surgeon: Manus Gunning, MD;  Location: [facility];  Service: Gastroenterology;  Laterality: N/A;  . Esophagogastroduodenoscopy (egd) with propofol N/A 12/26/2015    Procedure: ESOPHAGOGASTRODUODENOSCOPY (EGD) WITH PROPOFOL;  Surgeon: Manus Gunning, MD;  [facility];  Service: Gastroenterology;  Laterality: N/A;    Social History  Social History  . Marital Status: Married    Spouse Name: N/A  . Number of Children: 1  . Years of Education: N/A   Occupational History  . Not on file.   Social History Main Topics  . Smoking status: Current Every Day Smoker -- 1.00 packs/day    Types: Cigarettes  . Smokeless tobacco: Never Used  . Alcohol Use: No  . Drug Use: No  . Sexual Activity: Not on file   Other Topics Concern  . Not on file   Social History Narrative    Family History  Problem Relation Age of Onset  . CAD Father     MI and CABG  . Diabetes Father   . Heart disease Father   . Colon cancer Mother 81    ROS: no fevers or chills, productive cough, hemoptysis,  dysphasia, odynophagia, melena, hematochezia, dysuria, hematuria, rash, seizure activity, orthopnea, PND, pedal edema, claudication. Remaining systems are negative.  Physical Exam: Well-developed well-nourished in no acute distress.  Skin is warm and dry.  HEENT is normal.  Neck is supple.  Chest is clear to auscultation with normal expansion.  Cardiovascular exam is regular rate and rhythm.  Abdominal exam nontender or distended. No masses palpated. Extremities show no edema. neuro grossly intact  ECG Sinus bradycardia at a rate of 55. No ST changes.  Assessment and plan  1 paroxysmal atrial fibrillation-the patient is in sinus rhythm. Continue metoprolol. Continue xarelto.  2 carotid artery disease-continue statin. Repeat carotid Dopplers. 3 tobacco abuse-patient counseled on discontinuing. 4 hypertension-blood pressure controlled. Continue present medications. 5 hyperlipidemia-continue statin. 6 peripheral vascular disease-continue statin.  Kirk Ruths, MD

## 2016-07-09 ENCOUNTER — Ambulatory Visit (INDEPENDENT_AMBULATORY_CARE_PROVIDER_SITE_OTHER): Payer: Medicare Other | Admitting: Cardiology

## 2016-07-09 ENCOUNTER — Encounter: Payer: Self-pay | Admitting: Cardiology

## 2016-07-09 VITALS — BP 104/58 | HR 55 | Ht 63.0 in | Wt 153.0 lb

## 2016-07-09 DIAGNOSIS — Z720 Tobacco use: Secondary | ICD-10-CM

## 2016-07-09 DIAGNOSIS — I679 Cerebrovascular disease, unspecified: Secondary | ICD-10-CM | POA: Diagnosis not present

## 2016-07-09 DIAGNOSIS — I480 Paroxysmal atrial fibrillation: Secondary | ICD-10-CM

## 2016-07-09 DIAGNOSIS — E785 Hyperlipidemia, unspecified: Secondary | ICD-10-CM

## 2016-07-09 DIAGNOSIS — I10 Essential (primary) hypertension: Secondary | ICD-10-CM | POA: Diagnosis not present

## 2016-07-09 NOTE — Patient Instructions (Signed)
Your physician has requested that you have a carotid duplex. This test is an ultrasound of the carotid arteries in your neck. It looks at blood flow through these arteries that supply the brain with blood. Allow one hour for this exam. There are no restrictions or special instructions.   Your physician recommends that you schedule a follow-up appointment in: 3 months with Dr. Stanford Breed.   If you need a refill on your cardiac medications before your next appointment, please call your pharmacy.

## 2016-07-18 ENCOUNTER — Ambulatory Visit (HOSPITAL_COMMUNITY)
Admission: RE | Admit: 2016-07-18 | Discharge: 2016-07-18 | Disposition: A | Discharge status: Home / Self Care | Payer: Medicare Other | Source: Ambulatory Visit | Attending: Cardiology | Admitting: Cardiology

## 2016-07-18 DIAGNOSIS — E119 Type 2 diabetes mellitus without complications: Secondary | ICD-10-CM | POA: Diagnosis not present

## 2016-07-18 DIAGNOSIS — E785 Hyperlipidemia, unspecified: Secondary | ICD-10-CM | POA: Diagnosis not present

## 2016-07-18 DIAGNOSIS — I679 Cerebrovascular disease, unspecified: Secondary | ICD-10-CM | POA: Diagnosis not present

## 2016-07-18 DIAGNOSIS — I10 Essential (primary) hypertension: Secondary | ICD-10-CM | POA: Diagnosis not present

## 2016-07-18 DIAGNOSIS — I6523 Occlusion and stenosis of bilateral carotid arteries: Secondary | ICD-10-CM | POA: Diagnosis not present

## 2016-08-01 ENCOUNTER — Telehealth: Payer: Self-pay | Admitting: *Deleted

## 2016-08-01 NOTE — Telephone Encounter (Signed)
Hold xarelto 3 days prior to procedure and resume after when ok with surgery Kirk Ruths

## 2016-08-01 NOTE — Telephone Encounter (Signed)
Pt is needing clearance for L/1 kyphoplasty. They are also asking for directions regarding xarelto for the procedure. Will forward for dr Stanford Breed review

## 2016-08-01 NOTE — Telephone Encounter (Signed)
Will fax this note to the number provided. 

## 2016-08-06 ENCOUNTER — Other Ambulatory Visit (HOSPITAL_COMMUNITY): Payer: Self-pay | Admitting: Interventional Radiology

## 2016-08-06 ENCOUNTER — Telehealth: Payer: Self-pay | Admitting: Cardiology

## 2016-08-06 ENCOUNTER — Telehealth (HOSPITAL_COMMUNITY): Payer: Self-pay

## 2016-08-06 DIAGNOSIS — IMO0002 Reserved for concepts with insufficient information to code with codable children: Secondary | ICD-10-CM

## 2016-08-06 NOTE — Telephone Encounter (Signed)
Called to schedule consult, left message for pt to return call. AW 

## 2016-08-06 NOTE — Telephone Encounter (Signed)
This is Dr. Crenshaw's pt 

## 2016-08-06 NOTE — Telephone Encounter (Signed)
New message    Patient calling checking on the status of cardiac clearance .

## 2016-08-06 NOTE — Telephone Encounter (Signed)
Returned patient call-pt made aware clearance was faxed to MD (surgeon office) on 8/3 (per telephone note).    Pt verbalized understanding.

## 2016-08-12 ENCOUNTER — Telehealth: Payer: Self-pay | Admitting: Cardiology

## 2016-08-12 DIAGNOSIS — Z01818 Encounter for other preprocedural examination: Secondary | ICD-10-CM

## 2016-08-12 NOTE — Telephone Encounter (Signed)
Request for surgical clearance:  What type of surgery is being performed? Lumbar Kyphoplasty   1. When is this surgery scheduled? 08/19/16   2. Are there any medications that need to be held prior to surgery and how long?   3. Name of physician performing surgery? Dr.Pool    4. What is your office phone and fax number?fax # [phone]

## 2016-08-12 NOTE — Telephone Encounter (Signed)
lexiscan nuclear study and if neg, ok for surgery Kirk Ruths

## 2016-08-12 NOTE — Telephone Encounter (Signed)
Routed to provider for review.

## 2016-08-13 ENCOUNTER — Telehealth (HOSPITAL_COMMUNITY): Payer: Self-pay

## 2016-08-13 NOTE — Telephone Encounter (Signed)
Pt stated that she has already seen a dr about her back. Does not want to come in for a consult. AW

## 2016-08-13 NOTE — Telephone Encounter (Addendum)
Left message for pt to call to schedule testing. Spoke with vanessa at dr pool's office, aware of testing that is needed.

## 2016-08-14 ENCOUNTER — Encounter: Payer: Self-pay | Admitting: Cardiology

## 2016-08-14 NOTE — Telephone Encounter (Signed)
New message    Patient calling want the nurse to know she is putting her back surgery on hold due to her husband fell broke his knee.

## 2016-08-14 NOTE — Telephone Encounter (Signed)
This encounter was created in error - please disregard.

## 2016-10-22 ENCOUNTER — Encounter: Payer: Self-pay | Admitting: Cardiology

## 2016-10-30 NOTE — Progress Notes (Signed)
HPI: FU atrial fibrillation. She has a history of paroxysmal atrial fibrillation. She was treated in [place] previously. She had negative nuclear study approximately 10 years ago. She moved here in July 2015. Abdominal ultrasound May 2016 showed no aneurysm. ABIs November 2016 normal. Echocardiogram March 2017 showed normal LV systolic function. Carotid Dopplers July 2017 showed 40-59% left stenosis and 1-39% right. Since last seen, she denies dyspnea, chest pain, palpitations, syncope or bleeding. Significant stress from family members that had health problems in the past 6 months. She has had several two-hour bouts of atrial fibrillation.   Current Outpatient Prescriptions  Medication Sig Dispense Refill  . acetaminophen (TYLENOL) 500 MG tablet Take 1,000 mg by mouth every 6 (six) hours as needed for mild pain.     . Cholecalciferol (VITAMIN D3) 1000 UNITS CAPS Take 1 tablet by mouth daily.    . empagliflozin (JARDIANCE) 25 MG TABS tablet Take 25 mg by mouth daily.    Marland Kitchen gabapentin (NEURONTIN) 300 MG capsule Take 300 mg by mouth 3 (three) times daily.     Marland Kitchen glimepiride (AMARYL) 4 MG tablet Take 4 mg by mouth daily.     . insulin glargine (LANTUS) 100 UNIT/ML injection Inject 16 Units into the skin at bedtime.     . metoprolol tartrate (LOPRESSOR) 25 MG tablet Take 1 tablet (25 mg total) by mouth 2 (two) times daily. 180 tablet 3  . Multiple Vitamins-Minerals (CENTRUM SILVER PO) Take 1 tablet by mouth daily.    . rivaroxaban (XARELTO) 20 MG TABS tablet Take 20 mg by mouth daily with supper.    . rosuvastatin (CRESTOR) 20 MG tablet Take 20 mg by mouth every other day. Once every three days.     Marland Kitchen sertraline (ZOLOFT) 50 MG tablet Take 100 mg by mouth daily.    . vitamin B-12 (CYANOCOBALAMIN) 1000 MCG tablet Take 1,000 mcg by mouth daily.     No current facility-administered medications for this visit.      Past Medical History:  Diagnosis Date  . Anemia    microcytic   . Arthritis     . Atrial fibrillation ([place])   . Cataract    REMOVED BILATERAL  . Colon polyps   . Diabetes mellitus ([place])   . Fibromyalgia   . Gastroparesis   . Hyperlipidemia   . Hypertension   . Kidney stones   . Melanoma ([place])   . PMR (polymyalgia rheumatica) (HCC)   . PVD (peripheral vascular disease) ([place])     Past Surgical History:  Procedure Laterality Date  . APPENDECTOMY    . BREAST BIOPSY    . CESAREAN SECTION    . COLONOSCOPY    . COLONOSCOPY WITH PROPOFOL N/A 12/26/2015   Procedure: COLONOSCOPY WITH PROPOFOL;  Surgeon: Manus Gunning, MD;  [facility]: [facility];  Service: Gastroenterology;  Laterality: N/A;  . ESOPHAGOGASTRODUODENOSCOPY (EGD) WITH PROPOFOL N/A 12/26/2015   Procedure: ESOPHAGOGASTRODUODENOSCOPY (EGD) WITH PROPOFOL;  Surgeon: Manus Gunning, MD;  [facility];  Service: Gastroenterology;  Laterality: N/A;  . POLYPECTOMY      Social History   Social History  . Marital status: Married    Spouse name: N/A  . Number of children: 1  . Years of education: N/A   Occupational History  . Not on file.   Social History Main Topics  . Smoking status: Current Every Day Smoker    Packs/day: 1.00    Types: Cigarettes  . Smokeless tobacco: Never Used  .  Alcohol use No  . Drug use: No  . Sexual activity: Not on file   Other Topics Concern  . Not on file   Social History Narrative  . No narrative on file    Family History  Problem Relation Age of Onset  . CAD Father     MI and CABG  . Diabetes Father   . Heart disease Father   . Colon cancer Mother 46    ROS: no fevers or chills, productive cough, hemoptysis, dysphasia, odynophagia, melena, hematochezia, dysuria, hematuria, rash, seizure activity, orthopnea, PND, pedal edema, claudication. Remaining systems are negative.  Physical Exam: Well-developed well-nourished in no acute distress.  Skin is warm and dry.  HEENT is normal.  Neck is supple.  Chest is clear to  auscultation with normal expansion.  Cardiovascular exam is regular rate and rhythm.  Abdominal exam nontender or distended. No masses palpated. Extremities show no edema. neuro grossly intact  A/P  1 Atrial fibrillation-patient is in sinus rhythm on examination. Continue metoprolol and xarelto. Check hemoglobin and renal function.She has had several bouts in the past 6 months. If these become more frequent we will consider an antiarrhythmic but she would like to be conservative at this point.   2 Carotid artery disease-continue statin. Follow-up carotid Dopplers July 2018.  3 tobacco abuse-patient counseled on discontinuing.  4 hypertension-blood pressure controlled. Continue present medications.  5 hyperlipidemia-continue statin.  6 peripheral vascular disease-continue statin. No aspirin given need for anticoagulation.  Kirk Ruths, MD

## 2016-11-05 ENCOUNTER — Ambulatory Visit (INDEPENDENT_AMBULATORY_CARE_PROVIDER_SITE_OTHER): Payer: Medicare Other | Admitting: Cardiology

## 2016-11-05 ENCOUNTER — Other Ambulatory Visit: Payer: Self-pay | Admitting: *Deleted

## 2016-11-05 ENCOUNTER — Encounter: Payer: Self-pay | Admitting: Cardiology

## 2016-11-05 VITALS — BP 118/60 | HR 58 | Ht 63.0 in | Wt 156.0 lb

## 2016-11-05 DIAGNOSIS — E7800 Pure hypercholesterolemia, unspecified: Secondary | ICD-10-CM | POA: Diagnosis not present

## 2016-11-05 DIAGNOSIS — I4891 Unspecified atrial fibrillation: Secondary | ICD-10-CM

## 2016-11-05 DIAGNOSIS — I10 Essential (primary) hypertension: Secondary | ICD-10-CM | POA: Diagnosis not present

## 2016-11-05 DIAGNOSIS — Z79899 Other long term (current) drug therapy: Secondary | ICD-10-CM

## 2016-11-05 LAB — CBC
HEMATOCRIT: 44 % (ref 35.0–45.0)
Hemoglobin: 14.6 g/dL (ref 11.7–15.5)
MCH: 30.2 pg (ref 27.0–33.0)
MCHC: 33.2 g/dL (ref 32.0–36.0)
MCV: 91.1 fL (ref 80.0–100.0)
MPV: 9.2 fL (ref 7.5–12.5)
Platelets: 286 10*3/uL (ref 140–400)
RBC: 4.83 MIL/uL (ref 3.80–5.10)
RDW: 14.7 % (ref 11.0–15.0)
WBC: 8 10*3/uL (ref 3.8–10.8)

## 2016-11-05 NOTE — Patient Instructions (Signed)

## 2016-11-06 LAB — BASIC METABOLIC PANEL
BUN: 28 mg/dL — AB (ref 7–25)
CHLORIDE: 105 mmol/L (ref 98–110)
CO2: 27 mmol/L (ref 20–31)
CREATININE: 1.06 mg/dL — AB (ref 0.60–0.93)
Calcium: 9.2 mg/dL (ref 8.6–10.4)
GLUCOSE: 144 mg/dL — AB (ref 65–99)
POTASSIUM: 4.6 mmol/L (ref 3.5–5.3)
Sodium: 140 mmol/L (ref 135–146)

## 2016-11-18 ENCOUNTER — Telehealth: Payer: Self-pay

## 2016-11-18 NOTE — Telephone Encounter (Signed)
-----   Message from Manus Gunning, MD sent at 11/18/2016 11:48 AM EST ----- No that's ok, her recent one was normal. Thanks   ----- Message ----- From: Doristine Counter, RN Sent: 11/18/2016  11:33 AM To: Manus Gunning, MD  I had a reminder that patient is due for lab work (CBC) on 12/1, she just had one done on 11/7. Do you want her to have another one? Thanks, Almyra Free

## 2016-11-19 ENCOUNTER — Other Ambulatory Visit: Payer: Self-pay

## 2016-12-06 ENCOUNTER — Other Ambulatory Visit: Payer: Self-pay | Admitting: Internal Medicine

## 2017-06-02 ENCOUNTER — Encounter (HOSPITAL_COMMUNITY): Payer: Self-pay

## 2017-06-02 ENCOUNTER — Ambulatory Visit: Payer: Medicare Other | Admitting: Physician Assistant

## 2017-06-02 ENCOUNTER — Inpatient Hospital Stay (HOSPITAL_COMMUNITY): Payer: Medicare Other

## 2017-06-02 ENCOUNTER — Inpatient Hospital Stay (HOSPITAL_COMMUNITY)
Admission: EM | Admit: 2017-06-02 | Discharge: 2017-06-06 | DRG: 280 | Disposition: A | Discharge status: Home / Self Care | Payer: Medicare Other | Attending: Family Medicine | Admitting: Family Medicine

## 2017-06-02 ENCOUNTER — Telehealth: Payer: Self-pay | Admitting: Cardiology

## 2017-06-02 ENCOUNTER — Emergency Department (HOSPITAL_BASED_OUTPATIENT_CLINIC_OR_DEPARTMENT_OTHER): Payer: Medicare Other

## 2017-06-02 DIAGNOSIS — I2510 Atherosclerotic heart disease of native coronary artery without angina pectoris: Secondary | ICD-10-CM | POA: Diagnosis present

## 2017-06-02 DIAGNOSIS — Z85820 Personal history of malignant melanoma of skin: Secondary | ICD-10-CM | POA: Diagnosis not present

## 2017-06-02 DIAGNOSIS — J449 Chronic obstructive pulmonary disease, unspecified: Secondary | ICD-10-CM | POA: Diagnosis present

## 2017-06-02 DIAGNOSIS — I360 Nonrheumatic tricuspid (valve) stenosis: Secondary | ICD-10-CM | POA: Diagnosis not present

## 2017-06-02 DIAGNOSIS — I480 Paroxysmal atrial fibrillation: Secondary | ICD-10-CM | POA: Diagnosis present

## 2017-06-02 DIAGNOSIS — Z7901 Long term (current) use of anticoagulants: Secondary | ICD-10-CM

## 2017-06-02 DIAGNOSIS — I5181 Takotsubo syndrome: Secondary | ICD-10-CM | POA: Diagnosis present

## 2017-06-02 DIAGNOSIS — K5520 Angiodysplasia of colon without hemorrhage: Secondary | ICD-10-CM | POA: Diagnosis present

## 2017-06-02 DIAGNOSIS — I213 ST elevation (STEMI) myocardial infarction of unspecified site: Secondary | ICD-10-CM | POA: Diagnosis present

## 2017-06-02 DIAGNOSIS — I5021 Acute systolic (congestive) heart failure: Secondary | ICD-10-CM | POA: Diagnosis not present

## 2017-06-02 DIAGNOSIS — R9431 Abnormal electrocardiogram [ECG] [EKG]: Secondary | ICD-10-CM

## 2017-06-02 DIAGNOSIS — M797 Fibromyalgia: Secondary | ICD-10-CM | POA: Diagnosis present

## 2017-06-02 DIAGNOSIS — M353 Polymyalgia rheumatica: Secondary | ICD-10-CM | POA: Diagnosis present

## 2017-06-02 DIAGNOSIS — I110 Hypertensive heart disease with heart failure: Secondary | ICD-10-CM | POA: Diagnosis present

## 2017-06-02 DIAGNOSIS — M79609 Pain in unspecified limb: Secondary | ICD-10-CM | POA: Diagnosis not present

## 2017-06-02 DIAGNOSIS — Z79899 Other long term (current) drug therapy: Secondary | ICD-10-CM | POA: Diagnosis not present

## 2017-06-02 DIAGNOSIS — Z794 Long term (current) use of insulin: Secondary | ICD-10-CM

## 2017-06-02 DIAGNOSIS — IMO0001 Reserved for inherently not codable concepts without codable children: Secondary | ICD-10-CM | POA: Diagnosis present

## 2017-06-02 DIAGNOSIS — E1151 Type 2 diabetes mellitus with diabetic peripheral angiopathy without gangrene: Secondary | ICD-10-CM | POA: Diagnosis present

## 2017-06-02 DIAGNOSIS — D509 Iron deficiency anemia, unspecified: Secondary | ICD-10-CM | POA: Diagnosis present

## 2017-06-02 DIAGNOSIS — Z888 Allergy status to other drugs, medicaments and biological substances status: Secondary | ICD-10-CM

## 2017-06-02 DIAGNOSIS — I340 Nonrheumatic mitral (valve) insufficiency: Secondary | ICD-10-CM | POA: Diagnosis not present

## 2017-06-02 DIAGNOSIS — I5023 Acute on chronic systolic (congestive) heart failure: Secondary | ICD-10-CM | POA: Diagnosis present

## 2017-06-02 DIAGNOSIS — I5043 Acute on chronic combined systolic (congestive) and diastolic (congestive) heart failure: Secondary | ICD-10-CM | POA: Diagnosis not present

## 2017-06-02 DIAGNOSIS — Z720 Tobacco use: Secondary | ICD-10-CM | POA: Diagnosis present

## 2017-06-02 DIAGNOSIS — E119 Type 2 diabetes mellitus without complications: Secondary | ICD-10-CM

## 2017-06-02 DIAGNOSIS — I509 Heart failure, unspecified: Secondary | ICD-10-CM

## 2017-06-02 DIAGNOSIS — I214 Non-ST elevation (NSTEMI) myocardial infarction: Principal | ICD-10-CM | POA: Diagnosis present

## 2017-06-02 DIAGNOSIS — I10 Essential (primary) hypertension: Secondary | ICD-10-CM | POA: Diagnosis present

## 2017-06-02 DIAGNOSIS — E1143 Type 2 diabetes mellitus with diabetic autonomic (poly)neuropathy: Secondary | ICD-10-CM | POA: Diagnosis present

## 2017-06-02 LAB — APTT: APTT: 32 s (ref 24–36)

## 2017-06-02 LAB — ECHOCARDIOGRAM COMPLETE
E decel time: 232 msec
EERAT: 12.71
FS: 33 % (ref 28–44)
IVS/LV PW RATIO, ED: 0.93
LA diam end sys: 34 mm
LA diam index: 1.95 cm/m2
LA vol A4C: 41.7 ml
LA vol index: 28.1 mL/m2
LASIZE: 34 mm
LAVOL: 48.9 mL
LDCA: 2.54 cm2
LV E/e' medial: 12.71
LV PW d: 10.7 mm — AB (ref 0.6–1.1)
LV TDI E'LATERAL: 7.15
LV TDI E'MEDIAL: 3.92
LV e' LATERAL: 7.15 cm/s
LVEEAVG: 12.71
LVOTD: 18 mm
MV Dec: 232
MV Peak grad: 3 mmHg
MV pk A vel: 61 m/s
MVPKEVEL: 90.9 m/s
RV sys press: 59 mmHg
Reg peak vel: 374 cm/s
TR max vel: 374 cm/s

## 2017-06-02 LAB — BASIC METABOLIC PANEL
ANION GAP: 7 (ref 5–15)
BUN: 21 mg/dL — ABNORMAL HIGH (ref 6–20)
CHLORIDE: 107 mmol/L (ref 101–111)
CO2: 27 mmol/L (ref 22–32)
Calcium: 8.6 mg/dL — ABNORMAL LOW (ref 8.9–10.3)
Creatinine, Ser: 0.87 mg/dL (ref 0.44–1.00)
GFR calc non Af Amer: >60 mL/min (ref >60)
Glucose, Bld: 113 mg/dL — ABNORMAL HIGH (ref 65–99)
Potassium: 4 mmol/L (ref 3.5–5.1)
SODIUM: 141 mmol/L (ref 135–145)

## 2017-06-02 LAB — I-STAT TROPONIN, ED: TROPONIN I, POC: 0.41 ng/mL — AB (ref 0.00–0.08)

## 2017-06-02 LAB — CBC
HCT: 44.8 % (ref 36.0–46.0)
HEMOGLOBIN: 14.6 g/dL (ref 12.0–15.0)
MCH: 30.8 pg (ref 26.0–34.0)
MCHC: 32.6 g/dL (ref 30.0–36.0)
MCV: 94.5 fL (ref 78.0–100.0)
PLATELETS: 283 10*3/uL (ref 150–400)
RBC: 4.74 MIL/uL (ref 3.87–5.11)
RDW: 15.8 % — ABNORMAL HIGH (ref 11.5–15.5)
WBC: 10.3 10*3/uL (ref 4.0–10.5)

## 2017-06-02 LAB — TSH: TSH: 1.128 u[IU]/mL (ref 0.350–4.500)

## 2017-06-02 LAB — HEPARIN LEVEL (UNFRACTIONATED): HEPARIN UNFRACTIONATED: 1.02 [IU]/mL — AB (ref 0.30–0.70)

## 2017-06-02 LAB — GLUCOSE, CAPILLARY: Glucose-Capillary: 189 mg/dL — ABNORMAL HIGH (ref 65–99)

## 2017-06-02 LAB — BRAIN NATRIURETIC PEPTIDE: B NATRIURETIC PEPTIDE 5: 2200 pg/mL — AB (ref 0.0–100.0)

## 2017-06-02 MED ORDER — ASPIRIN 81 MG PO CHEW
81.0000 mg | CHEWABLE_TABLET | ORAL | Status: AC
  Administered 2017-06-03: 81 mg via ORAL
  Filled 2017-06-02: qty 1

## 2017-06-02 MED ORDER — SODIUM CHLORIDE 0.9% FLUSH: 3.0000 mL | Freq: Two times a day (BID) | INTRAVENOUS | Status: DC

## 2017-06-02 MED ORDER — FUROSEMIDE 10 MG/ML IJ SOLN
40.0000 mg | Freq: Two times a day (BID) | INTRAMUSCULAR | Status: DC
  Administered 2017-06-02 – 2017-06-04 (×3): 40 mg via INTRAVENOUS
  Filled 2017-06-02 (×3): qty 4

## 2017-06-02 MED ORDER — NITROGLYCERIN 0.4 MG SL SUBL: 0.4000 mg | SUBLINGUAL_TABLET | SUBLINGUAL | Status: DC | PRN

## 2017-06-02 MED ORDER — AMIODARONE LOAD VIA INFUSION
150.0000 mg | Freq: Once | INTRAVENOUS | Status: AC
  Administered 2017-06-02: 150 mg via INTRAVENOUS
  Filled 2017-06-02: qty 83.34

## 2017-06-02 MED ORDER — ISOSORBIDE DINITRATE 10 MG PO TABS
5.0000 mg | ORAL_TABLET | Freq: Three times a day (TID) | ORAL | Status: DC
  Administered 2017-06-02 – 2017-06-04 (×5): 5 mg via ORAL
  Filled 2017-06-02 (×5): qty 1

## 2017-06-02 MED ORDER — SODIUM CHLORIDE 0.9% FLUSH: 3.0000 mL | INTRAVENOUS | Status: DC | PRN

## 2017-06-02 MED ORDER — SODIUM CHLORIDE 0.9 % IV SOLN
INTRAVENOUS | Status: DC
  Administered 2017-06-03: 06:00:00 via INTRAVENOUS

## 2017-06-02 MED ORDER — ONDANSETRON HCL 4 MG PO TABS
4.0000 mg | ORAL_TABLET | Freq: Four times a day (QID) | ORAL | Status: DC | PRN
  Administered 2017-06-04: 4 mg via ORAL
  Filled 2017-06-02: qty 1

## 2017-06-02 MED ORDER — ASPIRIN 81 MG PO CHEW: 81.0000 mg | CHEWABLE_TABLET | Freq: Every day | ORAL | Status: DC

## 2017-06-02 MED ORDER — PERFLUTREN LIPID MICROSPHERE
1.0000 mL | INTRAVENOUS | Status: AC | PRN
  Administered 2017-06-02: 3 mL via INTRAVENOUS
  Filled 2017-06-02: qty 10

## 2017-06-02 MED ORDER — AMIODARONE HCL IN DEXTROSE 360-4.14 MG/200ML-% IV SOLN
60.0000 mg/h | Freq: Once | INTRAVENOUS | Status: AC
  Administered 2017-06-02: 60 mg/h via INTRAVENOUS
  Filled 2017-06-02: qty 200

## 2017-06-02 MED ORDER — SERTRALINE HCL 50 MG PO TABS
50.0000 mg | ORAL_TABLET | Freq: Every day | ORAL | Status: DC
  Administered 2017-06-03 – 2017-06-06 (×4): 50 mg via ORAL
  Filled 2017-06-02 (×4): qty 1

## 2017-06-02 MED ORDER — ROSUVASTATIN CALCIUM 10 MG PO TABS
20.0000 mg | ORAL_TABLET | ORAL | Status: DC
  Administered 2017-06-03 – 2017-06-05 (×2): 20 mg via ORAL
  Filled 2017-06-02 (×4): qty 2

## 2017-06-02 MED ORDER — ONDANSETRON HCL 4 MG/2ML IJ SOLN
4.0000 mg | Freq: Four times a day (QID) | INTRAMUSCULAR | Status: DC | PRN
  Administered 2017-06-04: 4 mg via INTRAVENOUS
  Filled 2017-06-02: qty 2

## 2017-06-02 MED ORDER — INSULIN ASPART 100 UNIT/ML ~~LOC~~ SOLN
0.0000 [IU] | Freq: Three times a day (TID) | SUBCUTANEOUS | Status: DC
  Administered 2017-06-03: 2 [IU] via SUBCUTANEOUS
  Administered 2017-06-03 – 2017-06-04 (×3): 1 [IU] via SUBCUTANEOUS
  Administered 2017-06-05 (×2): 2 [IU] via SUBCUTANEOUS
  Administered 2017-06-05 – 2017-06-06 (×2): 3 [IU] via SUBCUTANEOUS
  Administered 2017-06-06: 2 [IU] via SUBCUTANEOUS

## 2017-06-02 MED ORDER — SODIUM CHLORIDE 0.9% FLUSH
3.0000 mL | Freq: Two times a day (BID) | INTRAVENOUS | Status: DC
  Administered 2017-06-02 – 2017-06-06 (×4): 3 mL via INTRAVENOUS

## 2017-06-02 MED ORDER — AMIODARONE HCL IN DEXTROSE 360-4.14 MG/200ML-% IV SOLN
30.0000 mg/h | INTRAVENOUS | Status: DC
  Administered 2017-06-02 – 2017-06-03 (×3): 30 mg/h via INTRAVENOUS
  Filled 2017-06-02 (×2): qty 200

## 2017-06-02 MED ORDER — ASPIRIN 81 MG PO CHEW
81.0000 mg | CHEWABLE_TABLET | Freq: Every day | ORAL | Status: DC
  Administered 2017-06-04 – 2017-06-06 (×3): 81 mg via ORAL
  Filled 2017-06-02 (×4): qty 1

## 2017-06-02 MED ORDER — INSULIN GLARGINE 100 UNIT/ML ~~LOC~~ SOLN
15.0000 [IU] | Freq: Every day | SUBCUTANEOUS | Status: DC
  Administered 2017-06-02 – 2017-06-05 (×4): 15 [IU] via SUBCUTANEOUS
  Filled 2017-06-02 (×5): qty 0.15

## 2017-06-02 MED ORDER — ASPIRIN 81 MG PO CHEW
324.0000 mg | CHEWABLE_TABLET | Freq: Once | ORAL | Status: AC
  Administered 2017-06-02: 324 mg via ORAL
  Filled 2017-06-02: qty 4

## 2017-06-02 MED ORDER — FUROSEMIDE 10 MG/ML IJ SOLN
20.0000 mg | Freq: Once | INTRAMUSCULAR | Status: AC
  Administered 2017-06-02: 20 mg via INTRAVENOUS
  Filled 2017-06-02: qty 2

## 2017-06-02 MED ORDER — VITAMIN B-12 1000 MCG PO TABS
1000.0000 ug | ORAL_TABLET | Freq: Every day | ORAL | Status: DC
  Administered 2017-06-03 – 2017-06-06 (×4): 1000 ug via ORAL
  Filled 2017-06-02 (×4): qty 1

## 2017-06-02 MED ORDER — SODIUM CHLORIDE 0.9 % IV SOLN: 250.0000 mL | INTRAVENOUS | Status: DC | PRN

## 2017-06-02 MED ORDER — BISACODYL 10 MG RE SUPP: 10.0000 mg | Freq: Every day | RECTAL | Status: DC | PRN

## 2017-06-02 MED ORDER — GABAPENTIN 300 MG PO CAPS
300.0000 mg | ORAL_CAPSULE | Freq: Three times a day (TID) | ORAL | Status: DC
  Administered 2017-06-02 – 2017-06-06 (×11): 300 mg via ORAL
  Filled 2017-06-02 (×11): qty 1

## 2017-06-02 MED ORDER — MAGNESIUM HYDROXIDE 400 MG/5ML PO SUSP: 30.0000 mL | Freq: Every day | ORAL | Status: DC | PRN

## 2017-06-02 MED ORDER — HYDROCODONE-ACETAMINOPHEN 5-325 MG PO TABS
1.0000 | ORAL_TABLET | Freq: Four times a day (QID) | ORAL | Status: DC | PRN
  Administered 2017-06-03 – 2017-06-05 (×5): 1 via ORAL
  Filled 2017-06-02 (×5): qty 1

## 2017-06-02 MED ORDER — METOPROLOL TARTRATE 25 MG PO TABS
25.0000 mg | ORAL_TABLET | Freq: Two times a day (BID) | ORAL | Status: DC
  Administered 2017-06-02 – 2017-06-05 (×6): 25 mg via ORAL
  Filled 2017-06-02 (×6): qty 1

## 2017-06-02 MED ORDER — HEPARIN (PORCINE) IN NACL 100-0.45 UNIT/ML-% IJ SOLN
1050.0000 [IU]/h | INTRAMUSCULAR | Status: DC
  Administered 2017-06-02: 800 [IU]/h via INTRAVENOUS
  Filled 2017-06-02: qty 250

## 2017-06-02 NOTE — Progress Notes (Signed)
ANTICOAGULATION CONSULT NOTE - Initial Consult  Pharmacy Consult for heparin (PTA Xarelto held) Indication: chest pain/ACS (h/o afib)  Allergies  Allergen Reactions  . Metoclopramide Other (See Comments)    Tardive dyskinesia  . Lisinopril     cough  . Lyrica [Pregabalin] Swelling    TO LOWER EXTREMTIES  . Losartan     wheezing  . Metformin And Related Diarrhea    Patient Measurements: Height: 5\' 3"  (160 cm) Weight: 158 lb (71.7 kg) IBW/kg (Calculated) : 52.4 Heparin Dosing Weight: 67 kg   Vital Signs: Temp: 98.3 F (36.8 C) (06/04 1550) Temp Source: Oral (06/04 1550) BP: 94/73 (06/04 1800) Pulse Rate: 140 (06/04 1730)  Labs:  Recent Labs  06/02/17 1556  HGB 14.6  HCT 44.8  PLT 283  CREATININE 0.87    Estimated Creatinine Clearance: 52.2 mL/min (by C-G formula based on SCr of 0.87 mg/dL).   Medical History: Past Medical History:  Diagnosis Date  . Anemia    microcytic   . Arthritis   . Atrial fibrillation ([place])   . Cataract    REMOVED BILATERAL  . Colon polyps   . Diabetes mellitus ([place])   . Fibromyalgia   . Gastroparesis   . Hyperlipidemia   . Hypertension   . Kidney stones   . Melanoma ([place])   . PMR (polymyalgia rheumatica) (H[facility])   . PVD (peripheral vascular disease) [facility])     Assessment: 76 yo female admitted with ECG changes. Per cardiology, ECG suggestive of subacute anterior MI. She also has a history of afib and is on Xarelto PTA. Her last dose of Xarelto was 6/3 at 10PM. Pharmacy consulted to dose heparin. CBC stable, no s/s bleeding noted. Will follow aPTT and heparin levels until correlating.   Goal of Therapy:  Heparin level 0.3-0.7 units/ml  aPTT 66-102 seconds  Monitor platelets by anticoagulation protocol: Yes   Plan:  Start heparin gtt at 10 PM at 800 units/hr Baseline heparin level and aPTT now  Heparin level/aPTT with AM labs  Daily heparin level, aPTT, and CBC Monitor for s/s bleeding   Argie Ramming, PharmD Pharmacy  Resident  Pager [phone] 06/02/17 6:27 PM

## 2017-06-02 NOTE — Telephone Encounter (Signed)
Pt c/o swelling: STAT is pt has developed SOB within 24 hours  1. How long have you been experiencing swelling? week  2. Where is the swelling located? Legs and feet  3.  Are you currently taking a "fluid pill"? no 4.  Are you currently SOB? yes 5.  Have you traveled recently? [email or web address]

## 2017-06-02 NOTE — ED Notes (Signed)
ECHO being preformed at bedside

## 2017-06-02 NOTE — ED Triage Notes (Addendum)
[facility]- pt coming from PCP with complaint of increasing fatigue and shortness of breath. Pt has had to sleep sitting up at home X3 nights. MD concerned for EKG changes. 118/68, HR 70, SPO2% 95, Resp 16.    Pt denies SOB at this time. Pt states she is only short of breath when she exerts herself or sleeps. Resp e/u. Skin warm and dry. Lung sounds clear.

## 2017-06-02 NOTE — Consult Note (Signed)
Cardiology Consultation:   Patient ID: Kathleen Griffith; [id number]; 11/17/1941   Admit date: 06/02/2017 Date of Consult: 06/02/2017  Primary Care Provider: Smothers, Andree Elk, NP Primary Cardiologist: Stanford Breed Primary Electrophysiologist:  None   Patient Profile:   Kathleen Griffith is a 76 y.o. female with a hx of PAF, Smoking , carotid disease who is being seen today for the evaluation of abnormal ECG at the request of Dr Oleta Mouse. Seen by PCP ? Bouska And sent to ER for abnormal ECG.  History of PAF. 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 She continues to smoke and clinically has COPD. Denies chest pain to me. Noted to ER doctor some jaw pain a week or two ago. No fever Cough is non productive No leg pain but more edema.   Reviewed her ECG and she appears to have a subacute anterior wall MI rate 72  Loss of R  Waves across precordium. Anterior MI appears new since ECG 07/09/16  She does not meet Current criteria for acute ST elevation MI. She does not have any chest pain. istat troponin just Back at .41   History of Present Illness:   Kathleen Griffith 76 y.o. with 2 weeks of dyspnea and LE edema. She is under a lot of stress caring for her husband who is on dialysis and has had a heart transplant.   Past Medical History:  Diagnosis Date  . Anemia    microcytic   . Arthritis   . Atrial fibrillation ([place])   . Cataract    REMOVED BILATERAL  . Colon polyps   . Diabetes mellitus ([place])   . Fibromyalgia   . Gastroparesis   . Hyperlipidemia   . Hypertension   . Kidney stones   . Melanoma ([place])   . PMR (polymyalgia rheumatica) (H[facility])   . PVD (peripheral vascular disease) ([place])     Past Surgical History:  Procedure Laterality Date  . APPENDECTOMY    . BREAST BIOPSY    . CESAREAN SECTION    . COLONOSCOPY    . COLONOSCOPY WITH PROPOFOL N/A 12/26/2015   Procedure: COLONOSCOPY  WITH PROPOFOL;  Surgeon: Manus Gunning, MD;  Location: [facility];  Service: Gastroenterology;  Laterality: N/A;  . ESOPHAGOGASTRODUODENOSCOPY (EGD) WITH PROPOFOL N/A 12/26/2015   Procedure: ESOPHAGOGASTRODUODENOSCOPY (EGD) WITH PROPOFOL;  Surgeon: Manus Gunning, MD;  Location: [facility];  Service: Gastroenterology;  Laterality: N/A;  . POLYPECTOMY       Inpatient Medications: Scheduled Meds: . aspirin  324 mg Oral Once   Continuous Infusions:  PRN Meds:   Allergies:    Allergies  Allergen Reactions  . Metoclopramide Other (See Comments)    Tardive dyskinesia  . Lisinopril     cough  . Lyrica [Pregabalin] Swelling    TO LOWER EXTREMTIES  . Losartan     wheezing  . Metformin And Related Diarrhea    Social History:   Social History   Social History  . Marital status: Married    Spouse name: N/A  . Number of children: 1  . Years of education: N/A   Occupational History  . Not on file.   Social History Main Topics  . Smoking status: Current Every Day Smoker    Packs/day: 1.00    Types: Cigarettes  . Smokeless tobacco: Never Used  . Alcohol use No  . Drug use: No  .  Sexual activity: Not on file   Other Topics Concern  . Not on file   Social History Narrative  . No narrative on file    Family History:   The patient's family history includes CAD in her father; Colon cancer (age of onset: 51) in her mother; Diabetes in her father; Heart disease in her father.  ROS:  Please see the history of present illness.  ROS  All other ROS reviewed and negative.     Physical Exam/Data:   Vitals:   06/02/17 1550 06/02/17 1614 06/02/17 1630  BP: 100/60 111/68 115/74  Pulse: (!) 57  69  Resp: 20 (!) 29 18  Temp: 98.3 F (36.8 C)    TempSrc: Oral    SpO2: 98%  98%   No intake or output data in the 24 hours ending 06/02/17 1704 There were no vitals filed for this visit. There is no height or weight on file to calculate BMI.  Affect  appropriate Chronically ill white female  HEENT: normal Neck supple with no adenopathy JVP normal no bruits no thyromegaly Lungs rhonchi and mild exp wheezing and good diaphragmatic motion Heart:  S1/S2 no murmur, no rub, gallop or click PMI normal Abdomen: benighn, BS positve, no tenderness, no AAA no bruit.  No HSM or HJR Distal pulses intact with no bruits No edema Neuro non-focal Skin warm and dry No muscular weakness    EKG:  The EKG was personally reviewed and demonstrates subacute anterior MI compared To eCG done   Relevant CV Studies: Bedside Echo:    Laboratory Data:  Chemistry  Recent Labs Lab 06/02/17 1556  NA 141  K 4.0  CL 107  CO2 27  GLUCOSE 113*  BUN 21*  CREATININE 0.87  CALCIUM 8.6*  GFRNONAA >60  GFRAA >60  ANIONGAP 7    No results for input(s): PROT, ALBUMIN, AST, ALT, ALKPHOS, BILITOT in the last 168 hours. Hematology  Recent Labs Lab 06/02/17 1556  WBC 10.3  RBC 4.74  HGB 14.6  HCT 44.8  MCV 94.5  MCH 30.8  MCHC 32.6  RDW 15.8*  PLT 283   Cardiac EnzymesNo results for input(s): TROPONINI in the last 168 hours.   Recent Labs Lab 06/02/17 1608  TROPIPOC 0.41*    BNPNo results for input(s): BNP, PROBNP in the last 168 hours.  DDimer No results for input(s): DDIMER in the last 168 hours.  Radiology/Studies:  No results found.  Assessment and Plan:   1. Abnormal ECG:  Suspect patient has had subacute anterior MI in the last 2 weeks resulting in dyspnea and CHF>  She is anticoagulated and took xarelto last night and does not meet acute ST elevation criteria on current ECG which has evolved. Will do bedside echo to confirm RWMA] And assess EF and r/o apical thrombus. 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 She agrees to cath in am Will consult phramacy for heparin but may not start until am given xarelto. She has cough and wheezing with ACE/ARB start nitrates and low dose hydralazine  Would not start beta blocker in setting of acute CHF but likely coreg before d/c 2. Dyspnea:  Initial bedside images reviewed with anterior MI EF 25-30% will give lasix in ER Need definity to make sure there is no LV apical thrombus. No LV gram needed for cath CXR pending also had component of COPD from smoking Discussed smoking cessation Right heart cath with angiogram in am 3. Carotid: no  bruit f/u duplex due July of this year  4. HTN:  Well controlled.  Continue current medications and low sodium Dash type diet.       Signed, [name], MD  06/02/2017 5:04 PM

## 2017-06-02 NOTE — H&P (Addendum)
TRH H&P   Patient Demographics:    Kathleen Griffith, is a 76 y.o. female  MRN: [id number]   DOB - [birth year]  Admit Date - 06/02/2017  Outpatient Primary MD for the patient is Smothers, Andree Elk, NP  Outpatient Specialists: Dr Stanford Breed    Patient coming from: Home  Chief Complaint  Patient presents with  . Shortness of Breath  . Fatigue      HPI:    Kathleen Griffith  is a 76 y.o. female,History of paroxysmal atrial fibrillation on xaralto, DM type II non-insulin-dependent, hypertension, dyslipidemia who lives at home, comes in with 10-14 day history of gradually progressive shortness of breath and some ongoing fatigue which she is been struggling with for several years and attributes that to her caretaker role where she takes care of her sick husband on a regular basis. Patient apparently went to [place] for a vacation and over there she noticed that her lower extremity swelling started getting much worse than its baseline, she also had some exertional dyspnea along with orthopnea, no chest pain per se, came to the ER where her workup was suggestive of fluid overload, elevated BNP, EKG changes suggestive of possible recent MI, echo was done bedside showing EF 20%, cardiology saw the patient and requested [place] to admit. Patient currently besides mild orthopnea and leg edema no other complaints.    Review of systems:    In addition to the HPI above,   No Fever-chills, No Headache, No changes with Vision or hearing, No problems swallowing food or Liquids, No Chest pain, Shortness of breath and orthopnea as above, No Abdominal pain, No Nausea or Vommitting, Bowel movements are regular, No Blood in stool or Urine, No  dysuria, No new skin rashes or bruises, No new joints pains-aches,  No new weakness, tingling, numbness in any extremity, but chronic fatigue and worsening leg edema No recent weight gain or loss, No polyuria, polydypsia or polyphagia, No significant Mental Stressors.  A full 10 point Review of Systems was done, except as stated above, all other Review of Systems were negative.   With Past History of the following :    Past Medical History:  Diagnosis Date  . Anemia    microcytic   . Arthritis   . Atrial fibrillation ([place])   . Cataract    REMOVED BILATERAL  .  Colon polyps   . Diabetes mellitus ([place])   . Fibromyalgia   . Gastroparesis   . Hyperlipidemia   . Hypertension   . Kidney stones   . Melanoma ([place])   . PMR (polymyalgia rheumatica) ([facility])   . PVD (peripheral vascular disease) ([place])       Past Surgical History:  Procedure Laterality Date  . APPENDECTOMY    . BREAST BIOPSY    . CESAREAN SECTION    . COLONOSCOPY    . COLONOSCOPY WITH PROPOFOL N/A 12/26/2015   Procedure: COLONOSCOPY WITH PROPOFOL;  Surgeon: Manus Gunning, MD;  [facility];  Service: Gastroenterology;  Laterality: N/A;  . ESOPHAGOGASTRODUODENOSCOPY (EGD) WITH PROPOFOL N/A 12/26/2015   Procedure: ESOPHAGOGASTRODUODENOSCOPY (EGD) WITH PROPOFOL;  Surgeon: Manus Gunning, MD;  [facility];  Service: Gastroenterology;  Laterality: N/A;  . POLYPECTOMY        Social History:     Social History  Substance Use Topics  . Smoking status: Current Every Day Smoker    Packs/day: 1.00    Types: Cigarettes  . Smokeless tobacco: Never Used  . Alcohol use No         Family History :     Family History  Problem Relation Age of Onset  . CAD Father        MI and CABG  . Diabetes Father   . Heart disease Father   . Colon cancer Mother 16       Home Medications:   Prior to Admission medications   Medication Sig Start Date End Date Taking? Authorizing  Provider  acetaminophen (TYLENOL) 500 MG tablet Take 1,000 mg by mouth every 6 (six) hours as needed for mild pain.    Yes [provider]  Cholecalciferol (VITAMIN D3) 1000 UNITS CAPS Take 1 tablet by mouth daily.   Yes [provider]  empagliflozin (JARDIANCE) 25 MG TABS tablet Take 25 mg by mouth daily.   Yes [provider]  gabapentin (NEURONTIN) 300 MG capsule Take 300 mg by mouth 3 (three) times daily.    Yes [provider]  insulin glargine (LANTUS) 100 UNIT/ML injection Inject 25-27 Units into the skin at bedtime. 25 units and 27 units every other day rotating   Yes [provider]  metoprolol tartrate (LOPRESSOR) 25 MG tablet Take 1 tablet (25 mg total) by mouth 2 (two) times daily. 11/27/15  Yes Lelon Perla, MD  Multiple Vitamins-Minerals (CENTRUM SILVER PO) Take 1 tablet by mouth daily.   Yes [provider]  rivaroxaban (XARELTO) 20 MG TABS tablet Take 20 mg by mouth daily with supper.   Yes [provider]  rosuvastatin (CRESTOR) 20 MG tablet Take 20 mg by mouth every other day.    Yes [provider]  sertraline (ZOLOFT) 50 MG tablet Take 50 mg by mouth daily.  03/17/15  Yes [provider]  vitamin B-12 (CYANOCOBALAMIN) 1000 MCG tablet Take 1,000 mcg by mouth daily.   Yes [provider]     Allergies:     Allergies  Allergen Reactions  . Metoclopramide Other (See Comments)    Tardive dyskinesia  . Lisinopril     cough  . Lyrica [Pregabalin] Swelling    TO LOWER EXTREMTIES  . Losartan     wheezing  . Metformin And Related Diarrhea     Physical Exam:   Vitals  Blood pressure 94/73, pulse (!) 140, temperature 98.3 F (36.8 C), temperature source Oral, resp. rate Marland Kitchen)  23, SpO2 97 %.   1. General Pleasant elderly white female lying in bed in NAD,     2. Normal affect and insight, Not Suicidal or Homicidal, Awake Alert, Oriented X 3.  3. No F.N deficits, ALL C.Nerves  Intact, Strength 5/5 all 4 extremities, Sensation intact all 4 extremities, Plantars down going.  4. Ears and Eyes appear Normal, Conjunctivae clear, PERRLA. Moist Oral Mucosa.  5. Supple Neck, No JVD, No cervical lymphadenopathy appriciated, No Carotid Bruits.  6. Symmetrical Chest wall movement, Good air movement bilaterally, positive bilateral rales  7. RRR, No Gallops, Rubs or Murmurs, No Parasternal Heave.  8. Positive Bowel Sounds, Abdomen Soft, No tenderness, No organomegaly appriciated,No rebound -guarding or rigidity.  9.  No Cyanosis, Normal Skin Turgor, No Skin Rash or Bruise. 2+ leg edema  10. Good muscle tone,  joints appear normal , no effusions, Normal ROM.  11. No Palpable Lymph Nodes in Neck or Axillae      Data Review:    CBC  Recent Labs Lab 06/02/17 1556  WBC 10.3  HGB 14.6  HCT 44.8  PLT 283  MCV 94.5  MCH 30.8  MCHC 32.6  RDW 15.8*   ------------------------------------------------------------------------------------------------------------------  Chemistries   Recent Labs Lab 06/02/17 1556  NA 141  K 4.0  CL 107  CO2 27  GLUCOSE 113*  BUN 21*  CREATININE 0.87  CALCIUM 8.6*   ------------------------------------------------------------------------------------------------------------------ CrCl cannot be calculated (Unknown ideal weight.). ------------------------------------------------------------------------------------------------------------------ No results for input(s): TSH, T4TOTAL, T3FREE, THYROIDAB in the last 72 hours.  Invalid input(s): FREET3  Coagulation profile No results for input(s): INR, PROTIME in the last 168 hours. ------------------------------------------------------------------------------------------------------------------- No results for input(s): DDIMER in the last 72 hours. -------------------------------------------------------------------------------------------------------------------  Cardiac  Enzymes No results for input(s): CKMB, TROPONINI, MYOGLOBIN in the last 168 hours.  Invalid input(s): CK ------------------------------------------------------------------------------------------------------------------    Component Value Date/Time   BNP 2,200.0 (H) 06/02/2017 1556     ---------------------------------------------------------------------------------------------------------------  Urinalysis No results found for: COLORURINE, APPEARANCEUR, LABSPEC, PHURINE, GLUCOSEU, HGBUR, BILIRUBINUR, KETONESUR, PROTEINUR, UROBILINOGEN, NITRITE, LEUKOCYTESUR  ----------------------------------------------------------------------------------------------------------------   Imaging Results:    No results found.  My personal review of EKG: Rhythm A flutter with a rate 140s, nonspecific EKG changes with possible subacute anterior MI with deep Q waves   Assessment & Plan:      1. Subacute MI, acute on chronic systolic CHF. Bedside echo reveals EF of 20% with apical hypokinesis, EKG suggests subacute MI - patient will be admitted to a telemetry bed, IV heparin drip per pharmacy consult, continue beta blocker, give Lasix as tolerated, BP too low for ACE/ARB/Entresto, place on 81 of aspirin, continue statin, continue beta blocker if blood pressure tolerates, cardiology already on board. Supportive care with oxygen nebulizer treatments and sublingual nitroglycerin. Nothing by mouth after midnight. Left heart catheterization tomorrow morning.  2. Paroxysmal atrial flutter, [place] vasc 2 score of at least 5 - hold xaralto, heparin drip for #1 above, continue beta blocker, for the short-term since she has gone in [place] amiodarone drip has been ordered by cardiology. Case discussed with Dr. Johnsie Cancel personally by me.  3. DM type II. Since she'll be nothing by mouth after midnight half of home dose Lantus, sliding scale for now. Check A1c.  4. Tobacco abuse. Counseled to quit.  5. Dyslipidemia.  Continue home dose statin.  6. Chronic fatigue. Check TSH. Thereafter outpatient age-appropriate workup.   DVT Prophylaxis Heparin/Xaralto  AM Labs Ordered, also please review Full Orders  Family Communication: Admission, patients condition  and plan of care including tests being ordered have been discussed with the patient and who indicates understanding and agree with the plan and Code Status.  Code Status Full  Likely DC to  TBD  Condition GUARDED    Consults called: Cards    Admission status: Inpt    Time spent in minutes : 35   Lala Lund M.D on 06/02/2017 at 6:15 PM  Between 7am to 7pm - Pager - [phone] ( page via [facility], text pages only, please mention full 10 digit call back number).  After 7pm go to [email or web address] - password [email or web address]  [facility] - Office  [phone]

## 2017-06-02 NOTE — ED Provider Notes (Addendum)
[facility] DEPT Provider Note   CSN: [id number] Arrival date & time: 06/02/17  1536     History   Chief Complaint Chief Complaint  Patient presents with  . Shortness of Breath  . Fatigue    HPI Kathleen Griffith is a 76 y.o. female.  The history is provided by the patient.  Shortness of Breath  This is a new problem. The average episode lasts 2 weeks. The problem occurs frequently.The current episode started more than 1 week ago. The problem has not changed since onset.Associated symptoms include PND, orthopnea and leg swelling. Pertinent negatives include no fever, no cough, no sputum production, no chest pain, no syncope, no vomiting, no abdominal pain and no leg pain. It is unknown what precipitated the problem. She has tried nothing for the symptoms.   History of PAF on Eliquis. 3 months of fatigue and 2-3 weeks of DOE. No chest pain currently. Jaw pain one week ago that resolved with nitroglycerin. 1 week of LE edema. 1-2 days of sleeping upright and PND.   Past Medical History:  Diagnosis Date  . Anemia    microcytic   . Arthritis   . Atrial fibrillation ([place])   . Cataract    REMOVED BILATERAL  . Colon polyps   . Diabetes mellitus ([place])   . Fibromyalgia   . Gastroparesis   . Hyperlipidemia   . Hypertension   . Kidney stones   . Melanoma ([place])   . PMR (polymyalgia rheumatica) (HCC)   . PVD (peripheral vascular disease) [facility])     Patient Active Problem List   Diagnosis Date Noted  . NSTEMI (non-ST elevated myocardial infarction) ([place]) 06/02/2017  . Fracture of rib of right side 03/20/2016  . Acute respiratory failure with hypoxia ([place]) 03/20/2016  . Atrial fibrillation with RVR ([place]) 03/20/2016  . Pneumothorax on right 03/20/2016  . ARDS (adult respiratory distress syndrome) ([place]) 03/20/2016  . DM (diabetes mellitus) type II, controlled, with peripheral vascular disorder ([place]) 03/20/2016  . GERD (gastroesophageal reflux disease) 03/20/2016  . Dysphagia   .  Abdominal pain, epigastric   . Gastric polyp   . Anemia, iron deficiency   . Angiodysplasia of cecum   . Colon polyp   . 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 Chronic anticoagulation 07/28/2015  . Family history of colon cancer 07/28/2015  . CN (constipation) 07/28/2015  . Paroxysmal atrial fibrillation ([place]) 04/04/2015  . Peripheral vascular disease ([place]) 04/04/2015  . Tobacco abuse 04/04/2015  . Essential hypertension 04/04/2015  . Hyperlipidemia 04/04/2015    Past Surgical History:  Procedure Laterality Date  . APPENDECTOMY    . BREAST BIOPSY    . CESAREAN SECTION    . COLONOSCOPY    . COLONOSCOPY WITH PROPOFOL N/A 12/26/2015   Procedure: COLONOSCOPY WITH PROPOFOL;  Surgeon: Manus Gunning, MD;  Location: [facility];  Service: Gastroenterology;  Laterality: N/A;  . ESOPHAGOGASTRODUODENOSCOPY (EGD) WITH PROPOFOL N/A 12/26/2015   Procedure: ESOPHAGOGASTRODUODENOSCOPY (EGD) WITH PROPOFOL;  Surgeon: Manus Gunning, MD;  Location: [facility];  Service: Gastroenterology;  Laterality: N/A;  . LEFT HEART CATH AND CORONARY ANGIOGRAPHY N/A 06/03/2017   Procedure: Left Heart Cath and Coronary Angiography;  Surgeon: Martinique, Peter M, MD;  Location: [facility];  Service: Cardiovascular;  Laterality: N/A;  . POLYPECTOMY      OB History    No data available       Home Medications    Prior to Admission medications   Medication Sig Start Date End Date Taking?  Authorizing Provider  acetaminophen (TYLENOL) 500 MG tablet Take 1,000 mg by mouth every 6 (six) hours as needed for mild pain.    Yes [provider]  Cholecalciferol (VITAMIN D3) 1000 UNITS CAPS Take 1 tablet by mouth daily.   Yes [provider]  empagliflozin (JARDIANCE) 25 MG TABS tablet Take 25 mg by mouth daily.   Yes [provider]  gabapentin (NEURONTIN) 300 MG capsule Take 300 mg by mouth 3 (three) times daily.    Yes [provider]  Multiple  Vitamins-Minerals (CENTRUM SILVER PO) Take 1 tablet by mouth daily.   Yes [provider]  rivaroxaban (XARELTO) 20 MG TABS tablet Take 20 mg by mouth daily with supper.   Yes [provider]  rosuvastatin (CRESTOR) 20 MG tablet Take 20 mg by mouth every other day.    Yes [provider]  sertraline (ZOLOFT) 50 MG tablet Take 50 mg by mouth daily.  03/17/15  Yes [provider]  vitamin B-12 (CYANOCOBALAMIN) 1000 MCG tablet Take 1,000 mcg by mouth daily.   Yes [provider]  furosemide (LASIX) 40 MG tablet Take 1 tablet (40 mg total) by mouth 2 (two) times daily. 06/06/17 06/06/18  [name], MD  insulin glargine (LANTUS) 100 UNIT/ML injection Inject 0.15 mLs (15 Units total) into the skin at bedtime. 06/06/17   [name], MD  isosorbide mononitrate (IMDUR) 30 MG 24 hr tablet Take 1 tablet (30 mg total) by mouth daily. 06/06/17   [name], MD  metoprolol succinate (TOPROL-XL) 50 MG 24 hr tablet Take 1 tablet (50 mg total) by mouth daily. Take with or immediately following a meal. 06/06/17   [name], MD    Family History Family History  Problem Relation Age of Onset  . CAD Father        MI and CABG  . Diabetes Father   . Heart disease Father   . Colon cancer Mother 22    Social History Social History  Substance Use Topics  . Smoking status: Current Every Day Smoker    Packs/day: 1.00    Types: Cigarettes  . Smokeless tobacco: Never Used  . Alcohol use No     Allergies   Metoclopramide; Lisinopril; Lyrica [pregabalin]; Losartan; and Metformin and related   Review of Systems Review of Systems  Constitutional: Negative for fever.  Respiratory: Positive for shortness of breath. Negative for cough and sputum production.   Cardiovascular: Positive for orthopnea, leg swelling and PND. Negative for chest pain and syncope.  Gastrointestinal: Negative for abdominal pain and vomiting.  All other systems  reviewed and are negative.    Physical Exam Updated Vital Signs BP (!) 110/42   Pulse 70   Temp 97.9 F (36.6 C) (Oral)   Resp 13   Ht 5\' 3"  (1.6 m)   Wt 70 kg (154 lb 4.8 oz)   SpO2 99%   BMI 27.33 kg/m   Physical Exam Physical Exam  Nursing note and vitals reviewed. Constitutional: Well developed, well nourished, non-toxic, and in no acute distress Head: Normocephalic and atraumatic.  Mouth/Throat: Oropharynx is clear and moist.  Neck: Normal range of motion. Neck supple.  Cardiovascular: Regular rate and regular rhythm.   Pulmonary/Chest: Effort normal and breath sounds normal.  Abdominal: Soft. There is no tenderness. There is no rebound and no guarding.  Musculoskeletal: Normal range of motion. No edema Neurological: Alert, no facial droop, fluent speech, moves all extremities symmetrically Skin: Skin is warm and dry.  Psychiatric: Cooperative   ED Treatments / Results  Labs (all labs ordered are listed, but only abnormal results are displayed) Labs Reviewed  BASIC METABOLIC PANEL - Abnormal; Notable for the following:       Result Value   Glucose, Bld 113 (*)    BUN 21 (*)    Calcium 8.6 (*)    All other components within normal limits  CBC - Abnormal; Notable for the following:    RDW 15.8 (*)    All other components within normal limits  BRAIN NATRIURETIC PEPTIDE - Abnormal; Notable for the following:    B Natriuretic Peptide 2,200.0 (*)    All other components within normal limits  HEPARIN LEVEL (UNFRACTIONATED) - Abnormal; Notable for the following:    Heparin Unfractionated 1.02 (*)    All other components within normal limits  CBC - Abnormal; Notable for the following:    RDW 16.1 (*)    All other components within normal limits  BASIC METABOLIC PANEL - Abnormal; Notable for the following:    Potassium 3.4 (*)    Glucose, Bld 111 (*)    Calcium 8.0 (*)    GFR calc non Af Amer 56 (*)    All other components within normal limits  HEMOGLOBIN A1C -  Abnormal; Notable for the following:    Hgb A1c MFr Bld 9.4 (*)    All other components within normal limits  GLUCOSE, CAPILLARY - Abnormal; Notable for the following:    Glucose-Capillary 189 (*)    All other components within normal limits  CBC - Abnormal; Notable for the following:    RDW 16.0 (*)    All other components within normal limits  CREATININE, SERUM - Abnormal; Notable for the following:    GFR calc non Af Amer 55 (*)    All other components within normal limits  GLUCOSE, CAPILLARY - Abnormal; Notable for the following:    Glucose-Capillary 181 (*)    All other components within normal limits  GLUCOSE, CAPILLARY - Abnormal; Notable for the following:    Glucose-Capillary 125 (*)    All other components within normal limits  CBC - Abnormal; Notable for the following:    RDW 15.9 (*)    All other components within normal limits  APTT - Abnormal; Notable for the following:    aPTT 49 (*)    All other components within normal limits  GLUCOSE, CAPILLARY - Abnormal; Notable for the following:    Glucose-Capillary 247 (*)    All other components within normal limits  GLUCOSE, CAPILLARY - Abnormal; Notable for the following:    Glucose-Capillary 136 (*)    All other components within normal limits  GLUCOSE, CAPILLARY - Abnormal; Notable for the following:    Glucose-Capillary 133 (*)    All other components within normal limits  GLUCOSE, CAPILLARY - Abnormal; Notable for the following:    Glucose-Capillary 110 (*)    All other components within normal limits  BASIC METABOLIC PANEL - Abnormal; Notable for the following:    Chloride 97 (*)    Glucose, Bld 218 (*)    BUN 22 (*)    Creatinine, Ser 1.14 (*)    Calcium 7.7 (*)    GFR calc non Af Amer 46 (*)    GFR calc Af Amer 53 (*)    All other components within normal limits  GLUCOSE, CAPILLARY - Abnormal; Notable for the following:    Glucose-Capillary 131 (*)    All other components within  normal limits  GLUCOSE,  CAPILLARY - Abnormal; Notable for the following:    Glucose-Capillary 172 (*)    All other components within normal limits  BASIC METABOLIC PANEL - Abnormal; Notable for the following:    Chloride 98 (*)    Glucose, Bld 237 (*)    BUN 22 (*)    Calcium 7.9 (*)    GFR calc non Af Amer 53 (*)    All other components within normal limits  GLUCOSE, CAPILLARY - Abnormal; Notable for the following:    Glucose-Capillary 223 (*)    All other components within normal limits  BASIC METABOLIC PANEL - Abnormal; Notable for the following:    Chloride 96 (*)    Glucose, Bld 147 (*)    BUN 22 (*)    Calcium 8.6 (*)    GFR calc non Af Amer 58 (*)    All other components within normal limits  GLUCOSE, CAPILLARY - Abnormal; Notable for the following:    Glucose-Capillary 173 (*)    All other components within normal limits  GLUCOSE, CAPILLARY - Abnormal; Notable for the following:    Glucose-Capillary 208 (*)    All other components within normal limits  GLUCOSE, CAPILLARY - Abnormal; Notable for the following:    Glucose-Capillary 184 (*)    All other components within normal limits  GLUCOSE, CAPILLARY - Abnormal; Notable for the following:    Glucose-Capillary 229 (*)    All other components within normal limits  I-STAT TROPOININ, ED - Abnormal; Notable for the following:    Troponin i, poc 0.41 (*)    All other components within normal limits  HEPARIN LEVEL (UNFRACTIONATED)  APTT  TSH  APTT  PROTIME-INR  GLUCOSE, CAPILLARY  HEPARIN LEVEL (UNFRACTIONATED)  MAGNESIUM    EKG  EKG Interpretation  Date/Time:  Monday June 02 2017 17:23:00 EDT Ventricular Rate:  136 PR Interval:  192 QRS Duration: 89 QT Interval:  290 QTC Calculation: 437 R Axis:   -120 Text Interpretation:  Atrial flutter with predominant 2:1 AV block Probable inferior infarct, old Anterior infarct, old Lateral leads are also involved Baseline wander in lead(s) V5 new onset a flutter Confirmed by Brantley Stage [phone])  on 06/02/2017 6:10:27 PM       Radiology No results found.  Procedures Procedures (including critical care time) CRITICAL CARE Performed by: Forde Dandy   Total critical care time: 45 minutes  Critical care time was exclusive of separately billable procedures and treating other patients.  Critical care was necessary to treat or prevent imminent or life-threatening deterioration.  Critical care was time spent personally by me on the following activities: development of treatment plan with patient and/or surrogate as well as nursing, discussions with consultants, evaluation of patient's response to treatment, examination of patient, obtaining history from patient or surrogate, ordering and performing treatments and interventions, ordering and review of laboratory studies, ordering and review of radiographic studies, pulse oximetry and re-evaluation of patient's condition.  Medications Ordered in ED Medications  perflutren lipid microspheres (DEFINITY) IV suspension (3 mLs Intravenous Given 06/02/17 1735)  0.9% sodium chloride infusion (1 mL/kg/hr  71.6 kg Intravenous Rate/Dose Verify 06/03/17 1300)  aspirin chewable tablet 324 mg (324 mg Oral Given 06/02/17 1733)  furosemide (LASIX) injection 20 mg (20 mg Intravenous Given 06/02/17 1734)  aspirin chewable tablet 81 mg (81 mg Oral Given 06/03/17 0544)  amiodarone (NEXTERONE) 1.8 mg/mL load via infusion 150 mg (150 mg Intravenous Bolus from Bag 06/02/17 1857)  amiodarone (  NEXTERONE PREMIX) 360-4.14 MG/200ML-% (1.8 mg/mL) IV infusion (0 mg/hr Intravenous Stopped 06/03/17 0729)  amiodarone (NEXTERONE PREMIX) 360-4.14 MG/200ML-% (1.8 mg/mL) IV infusion (0 mg/hr Intravenous Stopped 06/03/17 0729)  heparin bolus via infusion 2,000 Units (2,000 Units Intravenous Bolus from Bag 06/03/17 0544)  potassium chloride SA (K-DUR,KLOR-CON) CR tablet 20 mEq (20 mEq Oral Given 06/03/17 0810)  heparin infusion 2 units/mL in 0.9 % sodium chloride (1,000 mLs Other New  Bag/Given 06/03/17 0906)  amiodarone (NEXTERONE) 1.8 mg/mL load via infusion 150 mg (150 mg Intravenous Bolus from Bag 06/05/17 0238)     Initial Impression / Assessment and Plan / ED Course  I have reviewed the triage vital signs and the nursing notes.  Pertinent labs & imaging results that were available during my care of the patient were reviewed by me and considered in my medical decision making (see chart for details).    EKG concerning for inferolateral ST elevation MI. However, currently she is asymptomatic, without dyspnea, fatigue, or chest pain. Initially attempted to speak with Dr. Irish Lack (on call for cath lab) for review to EKG, who redirected my call to general cardiology. Spoke with Dr. Johnsie Cancel who reviewed EKG and concerned for subacute MI. He has arranged emergent ECHO in ED, but given subacute nature did not recommend emergent cath lab. Dr. Milagros Reap on recommending medicine admission with plan for cath lab in the morning.  During ED course, patient converted into atrial flutter. Due to concern for CHF symptoms, she was started on amiodarone bolus and drip.  Discussed with Dr. Candiss Norse who will admit to medicine service.  Final Clinical Impressions(s) / ED Diagnoses   Final diagnoses:  ST elevation myocardial infarction (STEMI), unspecified artery [facility])    New Prescriptions Discharge Medication List as of 06/06/2017  3:21 PM    START taking these medications   Details  furosemide (LASIX) 40 MG tablet Take 1 tablet (40 mg total) by mouth 2 (two) times daily., Starting Fri 06/06/2017, Until Sat 06/06/2018, Normal    isosorbide mononitrate (IMDUR) 30 MG 24 hr tablet Take 1 tablet (30 mg total) by mouth daily., Starting Fri 06/06/2017, Normal    metoprolol succinate (TOPROL-XL) 50 MG 24 hr tablet Take 1 tablet (50 mg total) by mouth daily. Take with or immediately following a meal., Starting Fri 06/06/2017, Normal         Forde Dandy, MD 06/02/17 1846    Forde Dandy, MD 06/20/17  [phone]

## 2017-06-02 NOTE — Telephone Encounter (Signed)
Spoke with pt, for one week now she has swelling in her feet. She reports SOB with exertion and reports wheezing with lying down with the head of the bed elevated. She has no energy. Appointment made for patient to see hao meng pa this afternoon. She is going to call and see if she can see her medical doctor today as well because he is closer to her and she is not sure she would be able to drive to our office. She will call back if appointment needs to be canceled or she will come to the appointment this afternoon. Stressed to the patient the importance of being seen prior to the appt scheduled for July. Discussed care team concept with the patient to encourage her to see the APP in dr crenshaw's absence. Patient voiced understanding

## 2017-06-02 NOTE — Progress Notes (Signed)
   06/02/17 2035  Vitals  Temp 98.1 F (36.7 C)  Temp Source Oral  BP 92/66  BP Location Right Arm  BP Method Automatic  Patient Position (if appropriate) Lying  Pulse Rate (!) 133  Pulse Rate Source Dinamap  ECG Heart Rate (!) 114  Resp 18  Oxygen Therapy  SpO2 96 %  O2 Device Room Air  Height and Weight  Weight 71.6 kg (157 lb 14.4 oz)  Type of Scale Used Standing  Type of Weight Actual  Admitted pt to rm 3W06 from ED, pt alert and oriented, denied pain at this time, oriented to room, call bell placed within reach.

## 2017-06-02 NOTE — Progress Notes (Signed)
  Echocardiogram 2D Echocardiogram has been performed.  Kathleen Griffith 06/02/2017, 5:58 PM

## 2017-06-02 NOTE — ED Notes (Signed)
Dr. Liu at bedside 

## 2017-06-02 NOTE — ED Notes (Signed)
Main lab contacted to add on BNP

## 2017-06-03 ENCOUNTER — Encounter (HOSPITAL_COMMUNITY)
Admission: EM | Disposition: A | Discharge status: Home / Self Care | Payer: Self-pay | Source: Home / Self Care | Attending: Family Medicine

## 2017-06-03 ENCOUNTER — Encounter (HOSPITAL_COMMUNITY): Payer: Self-pay | Admitting: Cardiology

## 2017-06-03 DIAGNOSIS — I5043 Acute on chronic combined systolic (congestive) and diastolic (congestive) heart failure: Secondary | ICD-10-CM

## 2017-06-03 DIAGNOSIS — E1151 Type 2 diabetes mellitus with diabetic peripheral angiopathy without gangrene: Secondary | ICD-10-CM

## 2017-06-03 DIAGNOSIS — I480 Paroxysmal atrial fibrillation: Secondary | ICD-10-CM

## 2017-06-03 DIAGNOSIS — Z7901 Long term (current) use of anticoagulants: Secondary | ICD-10-CM

## 2017-06-03 HISTORY — PX: LEFT HEART CATH AND CORONARY ANGIOGRAPHY: CATH118249

## 2017-06-03 LAB — CBC
HCT: 43.5 % (ref 36.0–46.0)
HEMATOCRIT: 43.9 % (ref 36.0–46.0)
HEMOGLOBIN: 13.8 g/dL (ref 12.0–15.0)
Hemoglobin: 13.9 g/dL (ref 12.0–15.0)
MCH: 29.9 pg (ref 26.0–34.0)
MCH: 30 pg (ref 26.0–34.0)
MCHC: 31.7 g/dL (ref 30.0–36.0)
MCHC: 31.7 g/dL (ref 30.0–36.0)
MCV: 94.2 fL (ref 78.0–100.0)
MCV: 94.8 fL (ref 78.0–100.0)
Platelets: 290 10*3/uL (ref 150–400)
Platelets: 267 10*3/uL (ref 150–400)
RBC: 4.62 MIL/uL (ref 3.87–5.11)
RBC: 4.63 MIL/uL (ref 3.87–5.11)
RDW: 16.1 % — ABNORMAL HIGH (ref 11.5–15.5)
RDW: 16 % — AB (ref 11.5–15.5)
WBC: 8.3 10*3/uL (ref 4.0–10.5)
WBC: 8.6 10*3/uL (ref 4.0–10.5)

## 2017-06-03 LAB — BASIC METABOLIC PANEL
Anion gap: 10 (ref 5–15)
BUN: 19 mg/dL (ref 6–20)
CHLORIDE: 103 mmol/L (ref 101–111)
CO2: 28 mmol/L (ref 22–32)
Calcium: 8 mg/dL — ABNORMAL LOW (ref 8.9–10.3)
Creatinine, Ser: 0.96 mg/dL (ref 0.44–1.00)
GFR calc Af Amer: >60 mL/min (ref >60)
GFR calc non Af Amer: 56 mL/min — ABNORMAL LOW (ref >60)
GLUCOSE: 111 mg/dL — AB (ref 65–99)
POTASSIUM: 3.4 mmol/L — AB (ref 3.5–5.1)
Sodium: 141 mmol/L (ref 135–145)

## 2017-06-03 LAB — GLUCOSE, CAPILLARY
GLUCOSE-CAPILLARY: 181 mg/dL — AB (ref 65–99)
GLUCOSE-CAPILLARY: 247 mg/dL — AB (ref 65–99)
Glucose-Capillary: 65 mg/dL (ref 65–99)
Glucose-Capillary: 125 mg/dL — ABNORMAL HIGH (ref 65–99)

## 2017-06-03 LAB — CREATININE, SERUM
Creatinine, Ser: 0.97 mg/dL (ref 0.44–1.00)
GFR calc non Af Amer: 55 mL/min — ABNORMAL LOW (ref >60)

## 2017-06-03 LAB — APTT: aPTT: 35 seconds (ref 24–36)

## 2017-06-03 LAB — PROTIME-INR
INR: 1.12
Prothrombin Time: 14.4 seconds (ref 11.4–15.2)

## 2017-06-03 LAB — HEPARIN LEVEL (UNFRACTIONATED): Heparin Unfractionated: 0.36 IU/mL (ref 0.30–0.70)

## 2017-06-03 SURGERY — LEFT HEART CATH AND CORONARY ANGIOGRAPHY
Anesthesia: LOCAL

## 2017-06-03 MED ORDER — HEPARIN SODIUM (PORCINE) 1000 UNIT/ML IJ SOLN
INTRAMUSCULAR | Status: AC
  Filled 2017-06-03: qty 1

## 2017-06-03 MED ORDER — HEPARIN BOLUS VIA INFUSION
2000.0000 [IU] | Freq: Once | INTRAVENOUS | Status: AC
  Administered 2017-06-03: 2000 [IU] via INTRAVENOUS
  Filled 2017-06-03: qty 2000

## 2017-06-03 MED ORDER — LIDOCAINE HCL 1 % IJ SOLN
INTRAMUSCULAR | Status: AC
  Filled 2017-06-03: qty 20

## 2017-06-03 MED ORDER — MIDAZOLAM HCL 2 MG/2ML IJ SOLN
INTRAMUSCULAR | Status: DC | PRN
  Administered 2017-06-03: 1 mg via INTRAVENOUS

## 2017-06-03 MED ORDER — IOPAMIDOL (ISOVUE-370) INJECTION 76%
INTRAVENOUS | Status: AC
  Filled 2017-06-03: qty 100

## 2017-06-03 MED ORDER — SODIUM CHLORIDE 0.9 % IV SOLN: 250.0000 mL | INTRAVENOUS | Status: DC | PRN

## 2017-06-03 MED ORDER — FENTANYL CITRATE (PF) 100 MCG/2ML IJ SOLN
INTRAMUSCULAR | Status: DC | PRN
  Administered 2017-06-03: 25 ug via INTRAVENOUS

## 2017-06-03 MED ORDER — HEPARIN (PORCINE) IN NACL 2-0.9 UNIT/ML-% IJ SOLN
INTRAMUSCULAR | Status: AC | PRN
  Administered 2017-06-03: 1000 mL

## 2017-06-03 MED ORDER — HEPARIN (PORCINE) IN NACL 2-0.9 UNIT/ML-% IJ SOLN
INTRAMUSCULAR | Status: AC
  Filled 2017-06-03: qty 1000

## 2017-06-03 MED ORDER — MIDAZOLAM HCL 2 MG/2ML IJ SOLN
INTRAMUSCULAR | Status: AC
  Filled 2017-06-03: qty 2

## 2017-06-03 MED ORDER — SODIUM CHLORIDE 0.9 % WEIGHT BASED INFUSION
1.0000 mL/kg/h | INTRAVENOUS | Status: AC
  Administered 2017-06-03: 1 mL/kg/h via INTRAVENOUS

## 2017-06-03 MED ORDER — FENTANYL CITRATE (PF) 100 MCG/2ML IJ SOLN
INTRAMUSCULAR | Status: AC
  Filled 2017-06-03: qty 2

## 2017-06-03 MED ORDER — IOPAMIDOL (ISOVUE-370) INJECTION 76%
INTRAVENOUS | Status: DC | PRN
  Administered 2017-06-03: 30 mL via INTRA_ARTERIAL

## 2017-06-03 MED ORDER — SODIUM CHLORIDE 0.9% FLUSH: 3.0000 mL | INTRAVENOUS | Status: DC | PRN

## 2017-06-03 MED ORDER — SODIUM CHLORIDE 0.9% FLUSH
3.0000 mL | Freq: Two times a day (BID) | INTRAVENOUS | Status: DC
  Administered 2017-06-03 – 2017-06-06 (×5): 3 mL via INTRAVENOUS

## 2017-06-03 MED ORDER — HEPARIN (PORCINE) IN NACL 100-0.45 UNIT/ML-% IJ SOLN
1150.0000 [IU]/h | INTRAMUSCULAR | Status: DC
  Administered 2017-06-03: 1050 [IU]/h via INTRAVENOUS
  Filled 2017-06-03: qty 250

## 2017-06-03 MED ORDER — POTASSIUM CHLORIDE CRYS ER 20 MEQ PO TBCR
20.0000 meq | EXTENDED_RELEASE_TABLET | Freq: Once | ORAL | Status: AC
  Administered 2017-06-03: 20 meq via ORAL

## 2017-06-03 MED ORDER — VERAPAMIL HCL 2.5 MG/ML IV SOLN
INTRAVENOUS | Status: DC | PRN
  Administered 2017-06-03: 10 mL via INTRA_ARTERIAL

## 2017-06-03 MED ORDER — HEPARIN SODIUM (PORCINE) 1000 UNIT/ML IJ SOLN
INTRAMUSCULAR | Status: DC | PRN
  Administered 2017-06-03: 3500 [IU] via INTRAVENOUS

## 2017-06-03 MED ORDER — LIDOCAINE HCL (PF) 1 % IJ SOLN
INTRAMUSCULAR | Status: DC | PRN
  Administered 2017-06-03: 2 mL via SUBCUTANEOUS

## 2017-06-03 MED ORDER — VERAPAMIL HCL 2.5 MG/ML IV SOLN
INTRAVENOUS | Status: AC
  Filled 2017-06-03: qty 2

## 2017-06-03 SURGICAL SUPPLY — 11 items
CATH INFINITI 5 FR JL3.5 (CATHETERS) ×2 IMPLANT
CATH INFINITI JR4 5F (CATHETERS) ×2 IMPLANT
DEVICE RAD COMP TR BAND LRG (VASCULAR PRODUCTS) ×2 IMPLANT
GLIDESHEATH SLEND SS 6F .021 (SHEATH) ×2 IMPLANT
GUIDEWIRE INQWIRE 1.5J.035X260 (WIRE) ×1 IMPLANT
INQWIRE 1.5J .035X260CM (WIRE) ×2
KIT HEART LEFT (KITS) ×2 IMPLANT
PACK CARDIAC CATHETERIZATION (CUSTOM PROCEDURE TRAY) ×2 IMPLANT
SYR MEDRAD MARK V 150ML (SYRINGE) IMPLANT
TRANSDUCER W/STOPCOCK (MISCELLANEOUS) ×2 IMPLANT
TUBING CIL FLEX 10 FLL-RA (TUBING) ×2 IMPLANT

## 2017-06-03 NOTE — Interval H&P Note (Signed)
History and Physical Interval Note:  06/03/2017 8:57 AM  Liane Comber  has presented today for surgery, with the diagnosis of mi  The various methods of treatment have been discussed with the patient and family. After consideration of risks, benefits and other options for treatment, the patient has consented to  Procedure(s): Left Heart Cath and Coronary Angiography (N/A) as a surgical intervention .  The patient's history has been reviewed, patient examined, no change in status, stable for surgery.  I have reviewed the patient's chart and labs.  Questions were answered to the patient's satisfaction.   Cath Lab Visit (complete for each Cath Lab visit)  Clinical Evaluation Leading to the Procedure:   ACS: Yes.    Non-ACS:    Anginal Classification: CCS II  Anti-ischemic medical therapy: Minimal Therapy (1 class of medications)  Non-Invasive Test Results: No non-invasive testing performed  Prior CABG: No previous CABG        Collier Salina [facility] 06/03/2017 8:58 AM

## 2017-06-03 NOTE — Progress Notes (Signed)
PROGRESS NOTE                                                                                                                                                                                                             Patient Demographics:    Kathleen Griffith, is a 76 y.o. female, DOB - 09/03/1941, ZOX:[id number]  Admit date - 06/02/2017   Admitting Physician [name], MD  Outpatient Primary MD for the patient is Smothers, Andree Elk, NP  LOS - 1  Outpatient Specialists:Dr. Stanford Breed  Chief Complaint  Patient presents with  . Shortness of Breath  . Fatigue       Brief Narrative  76 year old female with personal A. fib on Xarelto, type 2 diabetes mellitus, hypertension, hyperlipidemia presented with two-week history of progressive shortness of breath with increasing fatigue. Also noticed lower leg swelling which has been progressively getting worse associated with orthopnea. In the ED she was found to be in what him overload with EKG changes and 2-D echo that at bedside showing EF of 20%. Cardiology admitted patient for possible NSTEMI and acute ischemic cardiomyopathy.    Subjective:      Assessment  & Plan :    Principal Problem:  Acute systolic CHF Suspect stress cardiomyopathy. Left heart catheterization showing proximal LAD to mid LAD lesion, 20% stenosed with normal LV and diastolic pressure. Cardiology recommends medical therapy. Continue IV Lasix 40 mg twice a day. Strict I/O and daily weight. Continue daily aspirin, metoprolol and statin. Allergy to ACEi/ ARB.  Active Problems:   Paroxysmal atrial fibrillation ([place]) Went into rapid heart rate briefly on admission. Start her on amiodarone drip. Xarelto was held and placed on heparin drip and admission.  Continue beta blocker    Tobacco abuse Counseled strongly on cessation.    Essential hypertension Stable. Continue home medications    DM  (diabetes mellitus) type II, controlled, with peripheral vascular disorder (HCC) Continue Lantus with sliding scale coverage.   1.    Code Status : Full code  Family Communication  : None at bedside  Disposition Plan  : Home once adequately diuresed and symptoms better, possibly in the next 48 hours  Barriers For Discharge : Active symptoms  Consults  :  Cardiology  Procedures  :  2-D echo Left heart catheterization  DVT Prophylaxis  :  Xarelto  Lab Results  Component Value Date   PLT 267 06/03/2017    Antibiotics  :    Anti-infectives    None        Objective:   Vitals:   06/03/17 1020 06/03/17 1037 06/03/17 1052 06/03/17 1118  BP: (!) 101/50 (!) 98/46 (!) 99/46 (!) 102/38  Pulse:    (!) 54  Resp:    16  Temp:    97.6 F (36.4 C)  TempSrc:    Oral  SpO2: 100% 100% 100% 100%  Weight:      Height:        Wt Readings from Last 3 Encounters:  06/02/17 71.6 kg (157 lb 14.4 oz)  11/05/16 70.8 kg (156 lb)  07/09/16 69.4 kg (153 lb)     Intake/Output Summary (Last 24 hours) at 06/03/17 1319 Last data filed at 06/03/17 1300  Gross per 24 hour  Intake           857.34 ml  Output             2500 ml  Net         -1642.66 ml     Physical Exam  Gen: not in distress HEENT: moist mucosa, supple neck, jvd+ Chest: Diminished bilateral breath sounds CVS: S1 and S2 irregular, no murmurs rub or gallop GI: soft, NT, ND,  Musculoskeletal: warm, trace edema bilaterally     Data Review:    CBC  Recent Labs Lab 06/02/17 1556 06/03/17 0354 06/03/17 1033  WBC 10.3 8.3 8.6  HGB 14.6 13.8 13.9  HCT 44.8 43.5 43.9  PLT 283 290 267  MCV 94.5 94.2 94.8  MCH 30.8 29.9 30.0  MCHC 32.6 31.7 31.7  RDW 15.8* 16.1* 16.0*    Chemistries   Recent Labs Lab 06/02/17 1556 06/03/17 0354 06/03/17 1033  NA 141 141  --   K 4.0 3.4*  --   CL 107 103  --   CO2 27 28  --   GLUCOSE 113* 111*  --   BUN 21* 19  --   CREATININE 0.87 0.96 0.97  CALCIUM 8.6*  8.0*  --    ------------------------------------------------------------------------------------------------------------------ No results for input(s): CHOL, HDL, LDLCALC, TRIG, CHOLHDL, LDLDIRECT in the last 72 hours.  No results found for: HGBA1C ------------------------------------------------------------------------------------------------------------------  Recent Labs  06/02/17 2105  TSH 1.128   ------------------------------------------------------------------------------------------------------------------ No results for input(s): VITAMINB12, FOLATE, FERRITIN, TIBC, IRON, RETICCTPCT in the last 72 hours.  Coagulation profile  Recent Labs Lab 06/03/17 0354  INR 1.12    No results for input(s): DDIMER in the last 72 hours.  Cardiac Enzymes No results for input(s): CKMB, TROPONINI, MYOGLOBIN in the last 168 hours.  Invalid input(s): CK ------------------------------------------------------------------------------------------------------------------    Component Value Date/Time   BNP 2,200.0 (H) 06/02/2017 1556    Inpatient Medications  Scheduled Meds: . [START ON 06/04/2017] aspirin  81 mg Oral Daily  . furosemide  40 mg Intravenous BID  . gabapentin  300 mg Oral TID  . insulin aspart  0-9 Units Subcutaneous TID WC  . insulin glargine  15 Units Subcutaneous QHS  . isosorbide dinitrate  5 mg Oral TID  . metoprolol tartrate  25 mg Oral BID  . rosuvastatin  20 mg Oral QODAY  . sertraline  50 mg Oral Daily  . sodium chloride flush  3 mL Intravenous Q12H  . sodium chloride flush  3 mL Intravenous Q12H  . vitamin B-12  1,000 mcg Oral Daily   Continuous Infusions: .  sodium chloride    . sodium chloride 1 mL/kg/hr (06/03/17 1300)  . amiodarone 30 mg/hr (06/03/17 1300)   PRN Meds:.sodium chloride, bisacodyl, HYDROcodone-acetaminophen, magnesium hydroxide, nitroGLYCERIN, ondansetron **OR** ondansetron (ZOFRAN) IV, sodium chloride flush  Micro Results No results  found for this or any previous visit (from the past 240 hour(s)).  Radiology Reports Dg Chest Portable 1 View  Result Date: 06/02/2017 CLINICAL DATA:  Dyspnea EXAM: PORTABLE CHEST 1 VIEW COMPARISON:  03/18/2016 CXR, 03/07/2016 chest CT FINDINGS: Top normal size cardiac chambers. Aortic atherosclerosis without aneurysm. Central pulmonary vascular redistribution noted consistent CHF. Superimposed pneumonia at the left lung base is not entirely excluded. Stable calcified nodule in the left lower lobe consistent with a granuloma. Chronic right posterior fourth through sixth rib fractures. IMPRESSION: 1. Findings suggest CHF. Superimposed pneumonia at the left lung base is not entirely excluded. 2. Aortic atherosclerosis. 3. Left lower lobe calcified granuloma. 4. Chronic right fourth through sixth rib fractures. Electronically Signed   By: Ashley Royalty M.D.   On: 06/02/2017 21:33    Time Spent in minutes  25   Louellen Molder M.D on 06/03/2017 at 1:19 PM  Between 7am to 7pm - Pager - [phone]  After 7pm go to [email or web address] - password [facility]  [facility] -  Office  [phone]

## 2017-06-03 NOTE — Progress Notes (Signed)
Progress Note  Patient Name: Kathleen Griffith Date of Encounter: 06/03/2017  Primary Cardiologist: Stanford Breed  Subjective   Less dyspnea going for cath   Inpatient Medications    Scheduled Meds: . [START ON 06/04/2017] aspirin  81 mg Oral Daily  . furosemide  40 mg Intravenous BID  . gabapentin  300 mg Oral TID  . insulin aspart  0-9 Units Subcutaneous TID WC  . insulin glargine  15 Units Subcutaneous QHS  . isosorbide dinitrate  5 mg Oral TID  . metoprolol tartrate  25 mg Oral BID  . rosuvastatin  20 mg Oral QODAY  . sertraline  50 mg Oral Daily  . sodium chloride flush  3 mL Intravenous Q12H  . sodium chloride flush  3 mL Intravenous Q12H  . vitamin B-12  1,000 mcg Oral Daily   Continuous Infusions: . sodium chloride    . sodium chloride 10 mL/hr at 06/03/17 0545  . amiodarone 30 mg/hr (06/03/17 0739)  . heparin 1,050 Units/hr (06/03/17 0544)   PRN Meds: sodium chloride, bisacodyl, HYDROcodone-acetaminophen, magnesium hydroxide, nitroGLYCERIN, ondansetron **OR** ondansetron (ZOFRAN) IV, sodium chloride flush   Vital Signs    Vitals:   06/02/17 2035 06/03/17 0142 06/03/17 0600 06/03/17 0810  BP: 92/66 (!) 98/55 (!) 94/43 (!) 100/38  Pulse: (!) 133 (!) 58  66  Resp: 18 17    Temp: 98.1 F (36.7 C) 98.7 F (37.1 C)    TempSrc: Oral Oral    SpO2: 96% 100% 99%   Weight: 157 lb 14.4 oz (71.6 kg)     Height:        Intake/Output Summary (Last 24 hours) at 06/03/17 0841 Last data filed at 06/03/17 0600  Gross per 24 hour  Intake           426.86 ml  Output             2500 ml  Net         -2073.14 ml   Filed Weights   06/02/17 1800 06/02/17 1951 06/02/17 2035  Weight: 158 lb (71.7 kg) 157 lb 14.4 oz (71.6 kg) 157 lb 14.4 oz (71.6 kg)    Telemetry    NSR converted from flutter - Personally Reviewed  ECG    Subacute anterior wall MI  - Personally Reviewed  Physical Exam   GEN: No acute distress.   Neck: No JVD Cardiac: RRR, no murmurs, rubs, or  gallops.  Respiratory: Clear to auscultation bilaterally. GI: Soft, nontender, non-distended  MS: No edema; No deformity. Neuro:  Nonfocal  Psych: Normal affect   Labs    Chemistry Recent Labs Lab 06/02/17 1556 06/03/17 0354  NA 141 141  K 4.0 3.4*  CL 107 103  CO2 27 28  GLUCOSE 113* 111*  BUN 21* 19  CREATININE 0.87 0.96  CALCIUM 8.6* 8.0*  GFRNONAA >60 56*  GFRAA >60 >60  ANIONGAP 7 10     Hematology Recent Labs Lab 06/02/17 1556 06/03/17 0354  WBC 10.3 8.3  RBC 4.74 4.62  HGB 14.6 13.8  HCT 44.8 43.5  MCV 94.5 94.2  MCH 30.8 29.9  MCHC 32.6 31.7  RDW 15.8* 16.1*  PLT 283 290    Cardiac EnzymesNo results for input(s): TROPONINI in the last 168 hours.  Recent Labs Lab 06/02/17 1608  TROPIPOC 0.41*     BNP Recent Labs Lab 06/02/17 1556  BNP 2,200.0*     DDimer No results for input(s): DDIMER in the last 168 hours.  Radiology    Dg Chest Portable 1 View  Result Date: 06/02/2017 CLINICAL DATA:  Dyspnea EXAM: PORTABLE CHEST 1 VIEW COMPARISON:  03/18/2016 CXR, 03/07/2016 chest CT FINDINGS: Top normal size cardiac chambers. Aortic atherosclerosis without aneurysm. Central pulmonary vascular redistribution noted consistent CHF. Superimposed pneumonia at the left lung base is not entirely excluded. Stable calcified nodule in the left lower lobe consistent with a granuloma. Chronic right posterior fourth through sixth rib fractures. IMPRESSION: 1. Findings suggest CHF. Superimposed pneumonia at the left lung base is not entirely excluded. 2. Aortic atherosclerosis. 3. Left lower lobe calcified granuloma. 4. Chronic right fourth through sixth rib fractures. Electronically Signed   By: Ashley Royalty M.D.   On: 06/02/2017 21:33    Cardiac Studies   Echo:  EF 25-30% anterior MI no mechanical complications  Patient Profile     76 y.o. female with jaw/chest pain a week ago. Abnormal ECG at PCP office complained of dyspnea , LE edema and fatigue Admitted with  subacute anterior MI, CHF and PAF  Assessment & Plan    1) CHF:  Intolerant to ARB/ACE started on nitrates consider starting coreg pending right heart cath and once acute CHF resolved Good response to lasix yesterday 2) PAF:  On amiodarone with conversion Has pre thrombotic sludge in apex from MI and may benefit from short term anticoagulation depending on what DAT is needed for coronary intervention 3) MI:  For cath this am with Dr Martinique likely total LAD willl need life vest and risk stratification for sudden death  [name]   Signed, [name], MD  06/03/2017, 8:41 AM

## 2017-06-03 NOTE — Progress Notes (Signed)
ANTICOAGULATION CONSULT NOTE - Follow Up Consult  Pharmacy Consult for Heparin (Xarelto on hold) Indication: atrial fibrillation  Allergies  Allergen Reactions  . Metoclopramide Other (See Comments)    Tardive dyskinesia  . Lisinopril     cough  . Lyrica [Pregabalin] Swelling    TO LOWER EXTREMTIES  . Losartan     wheezing  . Metformin And Related Diarrhea    Patient Measurements: Height: 5\' 3"  (160 cm) Weight: 157 lb 14.4 oz (71.6 kg) IBW/kg (Calculated) : 52.4 Heparin Dosing Weight: 67 kg  Vital Signs: Temp: 98.1 F (36.7 C) (06/05 1415) Temp Source: Oral (06/05 1415) BP: 100/61 (06/05 1415) Pulse Rate: 66 (06/05 1415)  Labs:  Recent Labs  06/02/17 1556 06/02/17 1923 06/03/17 0354 06/03/17 1033  HGB 14.6  --  13.8 13.9  HCT 44.8  --  43.5 43.9  PLT 283  --  290 267  APTT  --  32 35  --   LABPROT  --   --  14.4  --   INR  --   --  1.12  --   HEPARINUNFRC  --  1.02* 0.36  --   CREATININE 0.87  --  0.96 0.97    Estimated Creatinine Clearance: 46.8 mL/min (by C-G formula based on SCr of 0.97 mg/dL).  Assessment:  76 yo female admitted 06/02/17 with ECG changes. Per cardiology, ECG suggestive of subacute anterior MI. She also has a history of afib and is on Xarelto PTA. Her last dose of Xarelto was 6/3 at 10PM. Pharmacy consulted to dose heparin. Xarelto affecting heparin levels (baseline 1.02) so will using aPTTs until they are correlating    Initial aPTT was only 35 seconds (subtherapeutic) on 800 units/hr. Drip rate increased to 1050 units/hr ~6am, then drip off for cardiac cath before next aPTT was scheduled.  Heparin to resume 8 hrs after TR band off. RN reports TR band off ~12n.   Goal of Therapy:  Heparin level 0.3-0.7 units/ml aPTT 66-102 seconds Monitor platelets by anticoagulation protocol: Yes   Plan:   Resume heparin drip at 1050 units/hr at 8pm tonight.  Heparin level and aPTT ~8 hrs after drip resumes.  Daily heparin level, aPTT, and CBC while  on heparin.  Follow up for resuming Xarelto.  Arty Baumgartner, [place] Pager: [phone] 06/03/2017,3:31 PM

## 2017-06-03 NOTE — Progress Notes (Signed)
Pt back to NSR/SB, pt resting in bed asleep.

## 2017-06-03 NOTE — H&P (View-Only) (Signed)
Progress Note  Patient Name: Kathleen Griffith Date of Encounter: 06/03/2017  Primary Cardiologist: Stanford Breed  Subjective   Less dyspnea going for cath   Inpatient Medications    Scheduled Meds: . [START ON 06/04/2017] aspirin  81 mg Oral Daily  . furosemide  40 mg Intravenous BID  . gabapentin  300 mg Oral TID  . insulin aspart  0-9 Units Subcutaneous TID WC  . insulin glargine  15 Units Subcutaneous QHS  . isosorbide dinitrate  5 mg Oral TID  . metoprolol tartrate  25 mg Oral BID  . rosuvastatin  20 mg Oral QODAY  . sertraline  50 mg Oral Daily  . sodium chloride flush  3 mL Intravenous Q12H  . sodium chloride flush  3 mL Intravenous Q12H  . vitamin B-12  1,000 mcg Oral Daily   Continuous Infusions: . sodium chloride    . sodium chloride 10 mL/hr at 06/03/17 0545  . amiodarone 30 mg/hr (06/03/17 0739)  . heparin 1,050 Units/hr (06/03/17 0544)   PRN Meds: sodium chloride, bisacodyl, HYDROcodone-acetaminophen, magnesium hydroxide, nitroGLYCERIN, ondansetron **OR** ondansetron (ZOFRAN) IV, sodium chloride flush   Vital Signs    Vitals:   06/02/17 2035 06/03/17 0142 06/03/17 0600 06/03/17 0810  BP: 92/66 (!) 98/55 (!) 94/43 (!) 100/38  Pulse: (!) 133 (!) 58  66  Resp: 18 17    Temp: 98.1 F (36.7 C) 98.7 F (37.1 C)    TempSrc: Oral Oral    SpO2: 96% 100% 99%   Weight: 157 lb 14.4 oz (71.6 kg)     Height:        Intake/Output Summary (Last 24 hours) at 06/03/17 0841 Last data filed at 06/03/17 0600  Gross per 24 hour  Intake           426.86 ml  Output             2500 ml  Net         -2073.14 ml   Filed Weights   06/02/17 1800 06/02/17 1951 06/02/17 2035  Weight: 158 lb (71.7 kg) 157 lb 14.4 oz (71.6 kg) 157 lb 14.4 oz (71.6 kg)    Telemetry    NSR converted from flutter - Personally Reviewed  ECG    Subacute anterior wall MI  - Personally Reviewed  Physical Exam   GEN: No acute distress.   Neck: No JVD Cardiac: RRR, no murmurs, rubs, or  gallops.  Respiratory: Clear to auscultation bilaterally. GI: Soft, nontender, non-distended  MS: No edema; No deformity. Neuro:  Nonfocal  Psych: Normal affect   Labs    Chemistry Recent Labs Lab 06/02/17 1556 06/03/17 0354  NA 141 141  K 4.0 3.4*  CL 107 103  CO2 27 28  GLUCOSE 113* 111*  BUN 21* 19  CREATININE 0.87 0.96  CALCIUM 8.6* 8.0*  GFRNONAA >60 56*  GFRAA >60 >60  ANIONGAP 7 10     Hematology Recent Labs Lab 06/02/17 1556 06/03/17 0354  WBC 10.3 8.3  RBC 4.74 4.62  HGB 14.6 13.8  HCT 44.8 43.5  MCV 94.5 94.2  MCH 30.8 29.9  MCHC 32.6 31.7  RDW 15.8* 16.1*  PLT 283 290    Cardiac EnzymesNo results for input(s): TROPONINI in the last 168 hours.  Recent Labs Lab 06/02/17 1608  TROPIPOC 0.41*     BNP Recent Labs Lab 06/02/17 1556  BNP 2,200.0*     DDimer No results for input(s): DDIMER in the last 168 hours.  Radiology    Dg Chest Portable 1 View  Result Date: 06/02/2017 CLINICAL DATA:  Dyspnea EXAM: PORTABLE CHEST 1 VIEW COMPARISON:  03/18/2016 CXR, 03/07/2016 chest CT FINDINGS: Top normal size cardiac chambers. Aortic atherosclerosis without aneurysm. Central pulmonary vascular redistribution noted consistent CHF. Superimposed pneumonia at the left lung base is not entirely excluded. Stable calcified nodule in the left lower lobe consistent with a granuloma. Chronic right posterior fourth through sixth rib fractures. IMPRESSION: 1. Findings suggest CHF. Superimposed pneumonia at the left lung base is not entirely excluded. 2. Aortic atherosclerosis. 3. Left lower lobe calcified granuloma. 4. Chronic right fourth through sixth rib fractures. Electronically Signed   By: Ashley Royalty M.D.   On: 06/02/2017 21:33    Cardiac Studies   Echo:  EF 25-30% anterior MI no mechanical complications  Patient Profile     76 y.o. female with jaw/chest pain a week ago. Abnormal ECG at PCP office complained of dyspnea , LE edema and fatigue Admitted with  subacute anterior MI, CHF and PAF  Assessment & Plan    1) CHF:  Intolerant to ARB/ACE started on nitrates consider starting coreg pending right heart cath and once acute CHF resolved Good response to lasix yesterday 2) PAF:  On amiodarone with conversion Has pre thrombotic sludge in apex from MI and may benefit from short term anticoagulation depending on what DAT is needed for coronary intervention 3) MI:  For cath this am with Dr Martinique likely total LAD willl need life vest and risk stratification for sudden death  Kathleen Griffith   Signed, Kathleen Rouge, MD  06/03/2017, 8:41 AM

## 2017-06-03 NOTE — Progress Notes (Signed)
ANTICOAGULATION CONSULT NOTE - Follow-up Consult  Pharmacy Consult for heparin (PTA Xarelto held) Indication: chest pain/ACS (h/o afib)  Allergies  Allergen Reactions  . Metoclopramide Other (See Comments)    Tardive dyskinesia  . Lisinopril     cough  . Lyrica [Pregabalin] Swelling    TO LOWER EXTREMTIES  . Losartan     wheezing  . Metformin And Related Diarrhea    Patient Measurements: Height: 5\' 3"  (160 cm) Weight: 157 lb 14.4 oz (71.6 kg) IBW/kg (Calculated) : 52.4 Heparin Dosing Weight: 67 kg   Vital Signs: Temp: 98.7 F (37.1 C) (06/05 0142) Temp Source: Oral (06/05 0142) BP: 98/55 (06/05 0142) Pulse Rate: 58 (06/05 0142)  Labs:  Recent Labs  06/02/17 1556 06/02/17 1923 06/03/17 0354  HGB 14.6  --  13.8  HCT 44.8  --  43.5  PLT 283  --  290  APTT  --  32 35  LABPROT  --   --  14.4  INR  --   --  1.12  HEPARINUNFRC  --  1.02* 0.36  CREATININE 0.87  --  0.96    Estimated Creatinine Clearance: 47.3 mL/min (by C-G formula based on SCr of 0.96 mg/dL).  Assessment: 76 yo female admitted with ECG changes. Per cardiology, ECG suggestive of subacute anterior MI. She also has a history of afib and is on Xarelto PTA. Her last dose of Xarelto was 6/3 at 10PM. Pharmacy consulted to dose heparin. Xarelto affecting heparin levels (baseline 1.02) so will utilize PTT until they are correlating.  PTT 35 sec (subtherapeutic) on gtt at 800 units/hr. Heparin level 0.36 (falsely elevated by Xarelto). No issues with line or bleeding reported per RN. CBC stable.   Goal of Therapy:  Heparin level 0.3-0.7 units/ml  aPTT 66-102 seconds  Monitor platelets by anticoagulation protocol: Yes   Plan:  Bolus heparin 2000 units Increase heparin gtt to 1050 units/hr Will f/u 8 hr PTT  Sherlon Handing, PharmD, BCPS Clinical pharmacist, pager [phone] 06/03/17 5:29 AM

## 2017-06-04 LAB — CBC
HCT: 42.9 % (ref 36.0–46.0)
Hemoglobin: 13.6 g/dL (ref 12.0–15.0)
MCH: 29.8 pg (ref 26.0–34.0)
MCHC: 31.7 g/dL (ref 30.0–36.0)
MCV: 94.1 fL (ref 78.0–100.0)
PLATELETS: 292 10*3/uL (ref 150–400)
RBC: 4.56 MIL/uL (ref 3.87–5.11)
RDW: 15.9 % — AB (ref 11.5–15.5)
WBC: 10.5 10*3/uL (ref 4.0–10.5)

## 2017-06-04 LAB — GLUCOSE, CAPILLARY
GLUCOSE-CAPILLARY: 110 mg/dL — AB (ref 65–99)
Glucose-Capillary: 136 mg/dL — ABNORMAL HIGH (ref 65–99)
Glucose-Capillary: 133 mg/dL — ABNORMAL HIGH (ref 65–99)
Glucose-Capillary: 131 mg/dL — ABNORMAL HIGH (ref 65–99)

## 2017-06-04 LAB — HEMOGLOBIN A1C
HEMOGLOBIN A1C: 9.4 % — AB (ref 4.8–5.6)
Mean Plasma Glucose: 223 mg/dL

## 2017-06-04 LAB — APTT: APTT: 49 s — AB (ref 24–36)

## 2017-06-04 LAB — HEPARIN LEVEL (UNFRACTIONATED): Heparin Unfractionated: 0.38 IU/mL (ref 0.30–0.70)

## 2017-06-04 MED ORDER — FUROSEMIDE 20 MG PO TABS
20.0000 mg | ORAL_TABLET | Freq: Every day | ORAL | Status: DC
  Filled 2017-06-04: qty 1

## 2017-06-04 MED ORDER — ISOSORBIDE MONONITRATE ER 30 MG PO TB24
30.0000 mg | ORAL_TABLET | Freq: Every day | ORAL | Status: DC
  Administered 2017-06-04 – 2017-06-06 (×3): 30 mg via ORAL
  Filled 2017-06-04 (×3): qty 1

## 2017-06-04 MED ORDER — RIVAROXABAN 20 MG PO TABS
20.0000 mg | ORAL_TABLET | Freq: Every day | ORAL | Status: DC
  Administered 2017-06-04 – 2017-06-05 (×2): 20 mg via ORAL
  Filled 2017-06-04 (×2): qty 1

## 2017-06-04 NOTE — Discharge Instructions (Signed)

## 2017-06-04 NOTE — Progress Notes (Signed)
Progress Note  Patient Name: Kathleen Griffith Date of Encounter: 06/04/2017  Primary Cardiologist: Stanford Breed  Subjective   No chest pain less dyspnea   Inpatient Medications    Scheduled Meds: . aspirin  81 mg Oral Daily  . furosemide  40 mg Intravenous BID  . gabapentin  300 mg Oral TID  . insulin aspart  0-9 Units Subcutaneous TID WC  . insulin glargine  15 Units Subcutaneous QHS  . isosorbide dinitrate  5 mg Oral TID  . metoprolol tartrate  25 mg Oral BID  . rosuvastatin  20 mg Oral QODAY  . sertraline  50 mg Oral Daily  . sodium chloride flush  3 mL Intravenous Q12H  . sodium chloride flush  3 mL Intravenous Q12H  . vitamin B-12  1,000 mcg Oral Daily   Continuous Infusions: . sodium chloride    . amiodarone 30 mg/hr (06/03/17 2221)  . heparin 1,150 Units/hr (06/04/17 0515)   PRN Meds: sodium chloride, bisacodyl, HYDROcodone-acetaminophen, magnesium hydroxide, nitroGLYCERIN, ondansetron **OR** ondansetron (ZOFRAN) IV, sodium chloride flush   Vital Signs    Vitals:   06/03/17 1722 06/03/17 1934 06/04/17 0500 06/04/17 0812  BP: 112/68 111/60 106/65 110/66  Pulse: (!) 54 73 (!) 57 60  Resp: 12 14 15    Temp: 98.4 F (36.9 C)  98 F (36.7 C)   TempSrc: Oral  Oral   SpO2: 99% 96% 96%   Weight:   157 lb (71.2 kg)   Height:        Intake/Output Summary (Last 24 hours) at 06/04/17 0842 Last data filed at 06/04/17 0500  Gross per 24 hour  Intake          1036.43 ml  Output             2000 ml  Net          -963.57 ml   Filed Weights   06/02/17 1951 06/02/17 2035 06/04/17 0500  Weight: 157 lb 14.4 oz (71.6 kg) 157 lb 14.4 oz (71.6 kg) 157 lb (71.2 kg)    Telemetry    NSR converted from flutter - Personally Reviewed  ECG    Subacute anterior wall MI  - Personally Reviewed  Physical Exam  Right radial cath sight A  GEN: No acute distress.   Neck: No JVD Cardiac: RRR, no murmurs, rubs, or gallops.  Respiratory: Clear to auscultation bilaterally. GI:  Soft, nontender, non-distended  MS: No edema; No deformity. Neuro:  Nonfocal  Psych: Normal affect   Labs    Chemistry  Recent Labs Lab 06/02/17 1556 06/03/17 0354 06/03/17 1033  NA 141 141  --   K 4.0 3.4*  --   CL 107 103  --   CO2 27 28  --   GLUCOSE 113* 111*  --   BUN 21* 19  --   CREATININE 0.87 0.96 0.97  CALCIUM 8.6* 8.0*  --   GFRNONAA >60 56* 55*  GFRAA >60 >60 >60  ANIONGAP 7 10  --      Hematology  Recent Labs Lab 06/03/17 0354 06/03/17 1033 06/04/17 0344  WBC 8.3 8.6 10.5  RBC 4.62 4.63 4.56  HGB 13.8 13.9 13.6  HCT 43.5 43.9 42.9  MCV 94.2 94.8 94.1  MCH 29.9 30.0 29.8  MCHC 31.7 31.7 31.7  RDW 16.1* 16.0* 15.9*  PLT 290 267 292    Cardiac EnzymesNo results for input(s): TROPONINI in the last 168 hours.   Recent Labs Lab 06/02/17 1608  Juana Diaz  0.41*     BNP  Recent Labs Lab 06/02/17 1556  BNP 2,200.0*     DDimer No results for input(s): DDIMER in the last 168 hours.   Radiology    Dg Chest Portable 1 View  Result Date: 06/02/2017 CLINICAL DATA:  Dyspnea EXAM: PORTABLE CHEST 1 VIEW COMPARISON:  03/18/2016 CXR, 03/07/2016 chest CT FINDINGS: Top normal size cardiac chambers. Aortic atherosclerosis without aneurysm. Central pulmonary vascular redistribution noted consistent CHF. Superimposed pneumonia at the left lung base is not entirely excluded. Stable calcified nodule in the left lower lobe consistent with a granuloma. Chronic right posterior fourth through sixth rib fractures. IMPRESSION: 1. Findings suggest CHF. Superimposed pneumonia at the left lung base is not entirely excluded. 2. Aortic atherosclerosis. 3. Left lower lobe calcified granuloma. 4. Chronic right fourth through sixth rib fractures. Electronically Signed   By: Ashley Royalty M.D.   On: 06/02/2017 21:33    Cardiac Studies   Echo:  EF 25-30% anterior MI no mechanical complications  Patient Profile     76 y.o. female with jaw/chest pain a week ago. 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 Continue nitrates add beta blocker Intolerant to ACE/ARB;s Concern for pre apical thrombus and with PAF will d/c on anticoagulation for 6 months until hopefully EF recovers 2) CHF:  Improved continue lasix change to PO  3) DM she is concerned not on her jardiance per IM 4) PAF:  Maintaining NSR d/c iv amiodarone xarelto per pharmacy  Anticipate d/c in am   [name]   Signed, [name], MD  06/04/2017, 8:42 AM

## 2017-06-04 NOTE — Progress Notes (Signed)
SATURATION QUALIFICATIONS: (This note is used to comply with regulatory documentation for home oxygen)  Patient Saturations on Room Air at Rest =93%  Patient Saturations on Room Air while Ambulating =84%  Patient Saturations on 2 Liters of oxygen while Ambulating = 97%  Please briefly explain why patient needs home oxygen:

## 2017-06-04 NOTE — Plan of Care (Signed)
Problem: Pain Managment: Goal: General experience of comfort will improve Outcome: Progressing Pain controlled with PRN Norco.   Problem: Fluid Volume: Goal: Ability to maintain a balanced intake and output will improve Outcome: Progressing Patient using external urinary catheter due to incontinence. I&O's monitored.

## 2017-06-04 NOTE — Progress Notes (Signed)
Responded to [facility] Consult to support patient with prayer and presence.  Explored with patient needs and other concerns. Patient is stress over her husband medical condition and her being caregiver.  Pt. Is glad to be able to rest.  Prayer,  Emotional support, presence empathetic listening were provided.  Chaplain will follow as needed.  Jaclynn Major, [place], [facility], Pager [phone]

## 2017-06-04 NOTE — Progress Notes (Addendum)
PROGRESS NOTE    Kathleen Griffith  JIR:[id number] DOB: 10-Jun-1941 DOA: 06/02/2017 PCP: Junie Panning, NP  Outpatient Specialists:     Brief Narrative:  81 ? 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 medical therapy. Continue IV Lasix 40 mg twice a day.  Giving Isordil 5 mg 3 times a day-cardiology consolidated  Strict I/O and daily weight.  Continue daily aspirin, metoprolol and statin.  Allergy to ACEi/ ARB. heparin--[facility]  Active Problems:   Paroxysmal atrial fibrillation ([place]) Went into rapid heart rate briefly on admission.  Start her on amiodarone drip--d/c per Dr. 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 Offer nicotine patch    Essential hypertension Stable. Continue home medications    DM (diabetes mellitus) type II, controlled, with peripheral vascular disorder (HCC) A1c this admit 9.4 Continue Lantus 15 units at bedtime Sensitive sliding scale coverage. Blood sugars ranging between 136 and 247 explaind that we do not typically use oral hypoglycemics in [facility] understands   DVT prophylaxis: Eliquis Code Status: Full Family Communication: none + Disposition Plan: home likely am   Consultants:   cardiology  Procedures:   Cath  Antimicrobials:   none    Subjective: Well Emotional to some extent-relates long h/o issues with spouse who is on dialysis and has been admitted Thinks she can quit smoking eaitn g ok  Objective: Vitals:   06/03/17 1722 06/03/17 1934 06/04/17 0500 06/04/17 0812  BP: 112/68 111/60 106/65 110/66  Pulse: (!) 54 73 (!) 57 60  Resp: 12 14 15    Temp: 98.4 F (36.9 C)  98 F (36.7 C)   TempSrc: Oral  Oral   SpO2: 99% 96% 96%   Weight:   71.2 kg (157 lb)   Height:        Intake/Output Summary (Last 24 hours) at 06/04/17 0825 Last data filed at 06/04/17 0500  Gross per 24 hour  Intake          1036.43 ml  Output             2000 ml  Net          -963.57 ml   Filed Weights   06/02/17 1951 06/02/17 2035 06/04/17 0500  Weight: 71.6 kg (157 lb 14.4 oz) 71.6 kg (157 lb 14.4  oz) 71.2 kg (157 lb)    Examination:  depressed facies, no jvd, no bruit s1 s 2no m/r/g--tele shows predominant sinus brady 49 cta b No le edema, pulses are good abd soft nt nd no rebound no guard Neuro intact    Data Reviewed: I have personally reviewed following labs and imaging studies  CBC:  Recent Labs Lab 06/02/17 1556 06/03/17 0354 06/03/17 1033 06/04/17 0344  WBC 10.3 8.3 8.6 10.5  HGB 14.6 13.8 13.9 13.6  HCT 44.8 43.5 43.9 42.9  MCV 94.5 94.2 94.8 94.1  PLT 283 290 267 160   Basic  Metabolic Panel:  Recent Labs Lab 06/02/17 1556 06/03/17 0354 06/03/17 1033  NA 141 141  --   K 4.0 3.4*  --   CL 107 103  --   CO2 27 28  --   GLUCOSE 113* 111*  --   BUN 21* 19  --   CREATININE 0.87 0.96 0.97  CALCIUM 8.6* 8.0*  --    GFR: Estimated Creatinine Clearance: 46.7 mL/min (by C-G formula based on SCr of 0.97 mg/dL). Liver Function Tests: No results for input(s): AST, ALT, ALKPHOS, BILITOT, PROT, ALBUMIN in the last 168 hours. No results for input(s): LIPASE, AMYLASE in the last 168 hours. No results for input(s): AMMONIA in the last 168 hours. Coagulation Profile:  Recent Labs Lab 06/03/17 0354  INR 1.12   Cardiac Enzymes: No results for input(s): CKTOTAL, CKMB, CKMBINDEX, TROPONINI in the last 168 hours. BNP (last 3 results) No results for input(s): PROBNP in the last 8760 hours. HbA1C:  Recent Labs  06/02/17 2105  HGBA1C 9.4*   CBG:  Recent Labs Lab 06/03/17 0824 06/03/17 1117 06/03/17 1607 06/03/17 2203 06/04/17 0725  GLUCAP 65 181* 125* 247* 136*   Lipid Profile: No results for input(s): CHOL, HDL, LDLCALC, TRIG, CHOLHDL, LDLDIRECT in the last 72 hours. Thyroid Function Tests:  Recent Labs  06/02/17 2105  TSH 1.128   Anemia Panel: No results for input(s): VITAMINB12, FOLATE, FERRITIN, TIBC, IRON, RETICCTPCT in the last 72 hours. Urine analysis: No results found for: COLORURINE, APPEARANCEUR, LABSPEC, PHURINE, GLUCOSEU, HGBUR, BILIRUBINUR, KETONESUR, PROTEINUR, UROBILINOGEN, NITRITE, LEUKOCYTESUR Sepsis Labs: @LABRCNTIP (procalcitonin:4,lacticidven:4)  )No results found for this or any previous visit (from the past 240 hour(s)).       Radiology Studies: Dg Chest Portable 1 View  Result Date: 06/02/2017 CLINICAL DATA:  Dyspnea EXAM: PORTABLE CHEST 1 VIEW COMPARISON:  03/18/2016 CXR, 03/07/2016 chest CT FINDINGS: Top normal size cardiac chambers. Aortic atherosclerosis without aneurysm. Central pulmonary vascular redistribution  noted consistent CHF. Superimposed pneumonia at the left lung base is not entirely excluded. Stable calcified nodule in the left lower lobe consistent with a granuloma. Chronic right posterior fourth through sixth rib fractures. IMPRESSION: 1. Findings suggest CHF. Superimposed pneumonia at the left lung base is not entirely excluded. 2. Aortic atherosclerosis. 3. Left lower lobe calcified granuloma. 4. Chronic right fourth through sixth rib fractures. Electronically Signed   By: Ashley Royalty M.D.   On: 06/02/2017 21:33        Scheduled Meds: . aspirin  81 mg Oral Daily  . furosemide  40 mg Intravenous BID  . gabapentin  300 mg Oral TID  . insulin aspart  0-9 Units Subcutaneous TID WC  . insulin glargine  15 Units Subcutaneous QHS  . isosorbide dinitrate  5 mg Oral TID  . metoprolol tartrate  25 mg Oral BID  . rosuvastatin  20 mg Oral QODAY  . sertraline  50  mg Oral Daily  . sodium chloride flush  3 mL Intravenous Q12H  . sodium chloride flush  3 mL Intravenous Q12H  . vitamin B-12  1,000 mcg Oral Daily   Continuous Infusions: . sodium chloride    . amiodarone 30 mg/hr (06/03/17 2221)  . heparin 1,150 Units/hr (06/04/17 0515)     LOS: 2 days    Time spent: [place], MD [facility]   If 7PM-7AM, please contact night-coverage [email or web address] Password [email or web address] 06/04/2017, 8:25 AM

## 2017-06-04 NOTE — Progress Notes (Addendum)
ANTICOAGULATION CONSULT NOTE - Follow Up Consult  Pharmacy Consult for Heparin -> Xarelto Indication: atrial fibrillation  Allergies  Allergen Reactions  . Metoclopramide Other (See Comments)    Tardive dyskinesia  . Lisinopril     cough  . Lyrica [Pregabalin] Swelling    TO LOWER EXTREMTIES  . Losartan     wheezing  . Metformin And Related Diarrhea    Patient Measurements: Height: 5\' 3"  (160 cm) Weight: 157 lb (71.2 kg) IBW/kg (Calculated) : Kathleen.4 Heparin Dosing Weight: 67 kg  Vital Signs: Temp: 98 F (36.7 C) (06/06 0500) Temp Source: Oral (06/06 0500) BP: 110/66 (06/06 0812) Pulse Rate: 60 (06/06 0812)  Labs:  Recent Labs  06/02/17 1556 06/02/17 1923 06/03/17 0354 06/03/17 1033 06/04/17 0344  HGB 14.6  --  13.8 13.9 13.6  HCT 44.8  --  43.5 43.9 42.9  PLT 283  --  290 267 292  APTT  --  32 35  --  49*  LABPROT  --   --  14.4  --   --   INR  --   --  1.12  --   --   HEPARINUNFRC  --  1.02* 0.36  --  0.38  CREATININE 0.87  --  0.96 0.97  --     Estimated Creatinine Clearance: 46.7 mL/min (by C-G formula based on SCr of 0.97 mg/dL).  Assessment:   76 yr old Griffith admitted 6/4 with subacute anterior MI resulting in dyspnea and CHF. Hx afib, on Xarelto prior to admission. Xarelto held on admit and has been on IV heparin. S/p cardiac cath 6/5, no obstructive CAD, for medical therapy for stress-induced cardiomyopathy. Noted concern for apical thrombus with PAF. To transition back to Xarelto this evening.    Last aPTT was subtherapeutic (49 seconds) on heparin at 1050 units/hr and rate was increased to 1150 units/hr.  Follow up labs previously planned for 1pm today.     Creatinine clearance is borderline for needing Xarelto 15 mg vs 20 mg, but calculates to ~55 ml/min using total body weight.  Has been on Xarelto 20 mg daily since 02/2016.  Aspirin 81 mg daily added this admission.  Goal of Therapy:  Heparin level 0.3-0.7 units/ml aPTT 66-102 seconds   Appropriate Xarelto dose for indication Monitor platelets by anticoagulation protocol: Yes   Plan:   Resume Xarelto 20 mg daily with supper tonight.  Stop IV heparin when giving Xarelto dose.  She reports that she takes Xarelto at bedtime at home, but does take it with food.  Cancel aPTTs and heparin levels.  Bmet in am.  Arty Baumgartner, [place] Pager: [phone], or x [phone] 06/04/2017,11:15 AM

## 2017-06-04 NOTE — Progress Notes (Signed)
ANTICOAGULATION CONSULT NOTE - Follow Up Consult  Pharmacy Consult for Heparin (Xarelto on hold) Indication: atrial fibrillation  Allergies  Allergen Reactions  . Metoclopramide Other (See Comments)    Tardive dyskinesia  . Lisinopril     cough  . Lyrica [Pregabalin] Swelling    TO LOWER EXTREMTIES  . Losartan     wheezing  . Metformin And Related Diarrhea    Patient Measurements: Height: 5\' 3"  (160 cm) Weight: 157 lb 14.4 oz (71.6 kg) IBW/kg (Calculated) : 52.4  Vital Signs: Temp: 98.4 F (36.9 C) (06/05 1722) Temp Source: Oral (06/05 1722) BP: 111/60 (06/05 1934) Pulse Rate: 73 (06/05 1934)  Labs:  Recent Labs  06/02/17 1556 06/02/17 1923 06/03/17 0354 06/03/17 1033 06/04/17 0344  HGB 14.6  --  13.8 13.9 13.6  HCT 44.8  --  43.5 43.9 42.9  PLT 283  --  290 267 292  APTT  --  32 35  --  49*  LABPROT  --   --  14.4  --   --   INR  --   --  1.12  --   --   HEPARINUNFRC  --  1.02* 0.36  --  0.38  CREATININE 0.87  --  0.96 0.97  --     Estimated Creatinine Clearance: 46.8 mL/min (by C-G formula based on SCr of 0.97 mg/dL).  Assessment: Heparin while Xarelto on hold, s/p cath, aPTT is low this AM, using aPTT to dose for now given Xarelto influence on anti-Xa levels, no issues per RN.   Goal of Therapy:  Heparin level 0.3-0.7 units/ml aPTT 66-102 seconds Monitor platelets by anticoagulation protocol: Yes   Plan:  -Inc heparin to 1150 units/hr -1300 aPTT/HL   Narda Bonds 06/04/2017,4:31 AM

## 2017-06-05 ENCOUNTER — Inpatient Hospital Stay (HOSPITAL_COMMUNITY): Payer: Medicare Other

## 2017-06-05 DIAGNOSIS — Z720 Tobacco use: Secondary | ICD-10-CM

## 2017-06-05 DIAGNOSIS — M79609 Pain in unspecified limb: Secondary | ICD-10-CM

## 2017-06-05 LAB — BASIC METABOLIC PANEL
Anion gap: 10 (ref 5–15)
Anion gap: 9 (ref 5–15)
BUN: 22 mg/dL — AB (ref 6–20)
BUN: 22 mg/dL — AB (ref 6–20)
CHLORIDE: 97 mmol/L — AB (ref 101–111)
CHLORIDE: 98 mmol/L — AB (ref 101–111)
CO2: 29 mmol/L (ref 22–32)
CO2: 30 mmol/L (ref 22–32)
CREATININE: 1 mg/dL (ref 0.44–1.00)
Calcium: 7.7 mg/dL — ABNORMAL LOW (ref 8.9–10.3)
Calcium: 7.9 mg/dL — ABNORMAL LOW (ref 8.9–10.3)
Creatinine, Ser: 1.14 mg/dL — ABNORMAL HIGH (ref 0.44–1.00)
GFR calc Af Amer: 53 mL/min — ABNORMAL LOW (ref >60)
GFR calc Af Amer: >60 mL/min (ref >60)
GFR calc non Af Amer: 46 mL/min — ABNORMAL LOW (ref >60)
GFR calc non Af Amer: 53 mL/min — ABNORMAL LOW (ref >60)
GLUCOSE: 218 mg/dL — AB (ref 65–99)
Glucose, Bld: 237 mg/dL — ABNORMAL HIGH (ref 65–99)
POTASSIUM: 3.7 mmol/L (ref 3.5–5.1)
Potassium: 3.7 mmol/L (ref 3.5–5.1)
Sodium: 136 mmol/L (ref 135–145)
Sodium: 137 mmol/L (ref 135–145)

## 2017-06-05 LAB — GLUCOSE, CAPILLARY
GLUCOSE-CAPILLARY: 173 mg/dL — AB (ref 65–99)
Glucose-Capillary: 172 mg/dL — ABNORMAL HIGH (ref 65–99)
Glucose-Capillary: 223 mg/dL — ABNORMAL HIGH (ref 65–99)
Glucose-Capillary: 208 mg/dL — ABNORMAL HIGH (ref 65–99)

## 2017-06-05 LAB — MAGNESIUM: Magnesium: 2 mg/dL (ref 1.7–2.4)

## 2017-06-05 MED ORDER — AMIODARONE HCL IN DEXTROSE 360-4.14 MG/200ML-% IV SOLN
60.0000 mg/h | INTRAVENOUS | Status: DC
  Administered 2017-06-05 (×2): 60 mg/h via INTRAVENOUS
  Filled 2017-06-05 (×2): qty 200

## 2017-06-05 MED ORDER — AMIODARONE HCL IN DEXTROSE 360-4.14 MG/200ML-% IV SOLN: 30.0000 mg/h | INTRAVENOUS | Status: DC

## 2017-06-05 MED ORDER — AMIODARONE LOAD VIA INFUSION
150.0000 mg | Freq: Once | INTRAVENOUS | Status: AC
  Administered 2017-06-05: 150 mg via INTRAVENOUS
  Filled 2017-06-05: qty 83.34

## 2017-06-05 MED ORDER — FUROSEMIDE 10 MG/ML IJ SOLN
40.0000 mg | Freq: Two times a day (BID) | INTRAMUSCULAR | Status: DC
  Administered 2017-06-05 – 2017-06-06 (×2): 40 mg via INTRAVENOUS
  Filled 2017-06-05: qty 4

## 2017-06-05 MED ORDER — METOPROLOL TARTRATE 25 MG PO TABS
25.0000 mg | ORAL_TABLET | Freq: Two times a day (BID) | ORAL | Status: DC
  Administered 2017-06-05: 25 mg via ORAL
  Filled 2017-06-05 (×2): qty 1

## 2017-06-05 MED ORDER — POTASSIUM CHLORIDE CRYS ER 20 MEQ PO TBCR
20.0000 meq | EXTENDED_RELEASE_TABLET | Freq: Two times a day (BID) | ORAL | Status: DC
  Administered 2017-06-05 – 2017-06-06 (×3): 20 meq via ORAL
  Filled 2017-06-05 (×2): qty 1

## 2017-06-05 NOTE — Plan of Care (Signed)
Problem: Tissue Perfusion: Goal: Risk factors for ineffective tissue perfusion will decrease Patient converted back to AFIB around 0230, started back on Amio drip. Converted back to SR/SB around 0420.

## 2017-06-05 NOTE — Progress Notes (Signed)
**  Preliminary report by tech**  Left lower extremity venous duplex complete. There is no evidence of deep or superficial vein thrombosis involving the left lower extremity. All visualized vessels appear patent and compressible. There is no evidence of a Baker's cyst on the left.  Results were given to the patient's nurse, Rachel Moulds.  06/05/17 3:45 PM Kathleen Griffith RVT

## 2017-06-05 NOTE — Progress Notes (Signed)
PROGRESS NOTE    Zuleica Seith  CXK:[id number] DOB: 25-Jul-1941 DOA: 06/02/2017 PCP: Junie Panning, NP  Outpatient Specialists:     Brief Narrative:  56 ? 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 acute ischemic cardiomyopathy cardiac cath was done 06/03/2017-proximal LAD-mid LAD lesion 20% stenosed  nonobstructive CAD seen  normal LVEDP    Assessment & Plan:   Principal Problem:   NSTEMI (non-ST elevated myocardial infarction) (HCC) Active Problems:   Paroxysmal atrial fibrillation (HCC)   Tobacco abuse   Essential hypertension   Chronic anticoagulation   Anemia, iron deficiency   Angiodysplasia of cecum   DM (diabetes mellitus) type II, controlled, with peripheral vascular disorder (HCC)    Acute systolic CHF Takotsubo cardiomyopathy-son hospitalized day before her--EKG 6/4 shows prototypical st-t changes Left heart catheterization showing proximal LAD to mid LAD lesion, 20% stenosed with normal LV and diastolic pressure. medical therapy. Continue IV Lasix 40 mg twice a day for now Giving Isordil 5 mg 3 times a day-cardiology consolidated  Strict I/O -3.9 l since admit  Continue daily aspirin, metoprolol and statin.  Allergy to ACEi/ ARB. heparin--->Xarelto  R/o LLE DVT Tender in calf heparin level slightly subtx earlier in admit Await Duplex LLE--nursing aware to let me know if +  atrial fibrillation ([place]) Went into rapid heart rate again overnight 6/7-D/c Amiod load Had been initially d/c per Dr. 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 Offer nicotine patch  Essential hypertension Stable. Continue home medications  DM (diabetes mellitus) type II, controlled, with peripheral vascular disorder (HCC) A1c this admit 9.4 Continue Lantus 15 units at bedtime Sensitive sliding scale coverage. Blood sugars 172-223 Resume oral hypoglycemics post Hospital   DVT prophylaxis: Eliquis Code Status: Full Family Communication: none + Disposition Plan: home likely am   Consultants:   cardiology  Procedures:   Cath  Antimicrobials:   none    Subjective:  Awake alert pleasant some LLE discomfort no n/v eating ok No cp Wants to go home husband placed at SNF earlier today Feels winded but better to some degree  Objective: Vitals:   06/04/17 2056 06/05/17 0300 06/05/17 0500 06/05/17 1047  BP: 114/60 111/63  (!) 115/56  Pulse: 68 60  70  Resp: 15 16    Temp: 98.4 F (36.9 C) 98.2 F (36.8 C)    TempSrc: Oral Oral    SpO2: 99%     Weight:   71 kg (156 lb 8 oz)   Height:   5\' 3"  (1.6 m)     Intake/Output Summary (Last 24 hours) at 06/05/17 1349 Last data filed at 06/05/17 0848  Gross per 24 hour  Intake            378.4 ml  Output             1300 ml  Net           -921.6 ml   Filed Weights   06/02/17 2035  06/04/17 0500 06/05/17 0500  Weight: 71.6 kg (157 lb 14.4 oz) 71.2 kg (157 lb) 71 kg (156 lb 8 oz)    Examination:  depressed facies, mild jvd at 30 deg, no bruit s1 s 2no m/r/g--tele shows predominant sinus brady 49 cta b No le edema, pulses are good abd soft nt nd no rebound no guard Neuro intact    Data Reviewed: I have personally reviewed following labs and imaging studies  CBC:  Recent Labs Lab 06/02/17 1556 06/03/17 0354 06/03/17 1033 06/04/17 0344  WBC 10.3 8.3 8.6 10.5  HGB 14.6 13.8 13.9 13.6  HCT 44.8 43.5 43.9 42.9  MCV 94.5  94.2 94.8 94.1  PLT 283 290 267 409   Basic Metabolic Panel:  Recent Labs Lab 06/02/17 1556 06/03/17 0354 06/03/17 1033 06/05/17 0246 06/05/17 1112  NA 141 141  --  136 137  K 4.0 3.4*  --  3.7 3.7  CL 107 103  --  97* 98*  CO2 27 28  --  29 30  GLUCOSE 113* 111*  --  218* 237*  BUN 21* 19  --  22* 22*  CREATININE 0.87 0.96 0.97 1.14* 1.00  CALCIUM 8.6* 8.0*  --  7.7* 7.9*  MG  --   --   --  2.0  --    GFR: Estimated Creatinine Clearance: 45.2 mL/min (by C-G formula based on SCr of 1 mg/dL). Liver Function Tests: No results for input(s): AST, ALT, ALKPHOS, BILITOT, PROT, ALBUMIN in the last 168 hours. No results for input(s): LIPASE, AMYLASE in the last 168 hours. No results for input(s): AMMONIA in the last 168 hours. Coagulation Profile:  Recent Labs Lab 06/03/17 0354  INR 1.12   Cardiac Enzymes: No results for input(s): CKTOTAL, CKMB, CKMBINDEX, TROPONINI in the last 168 hours. BNP (last 3 results) No results for input(s): PROBNP in the last 8760 hours. HbA1C:  Recent Labs  06/02/17 2105  HGBA1C 9.4*   CBG:  Recent Labs Lab 06/04/17 1119 06/04/17 1622 06/04/17 2047 06/05/17 0756 06/05/17 1144  GLUCAP 133* 110* 131* 172* 223*   Lipid Profile: No results for input(s): CHOL, HDL, LDLCALC, TRIG, CHOLHDL, LDLDIRECT in the last 72 hours. Thyroid Function Tests:  Recent Labs  06/02/17 2105  TSH 1.128   Anemia Panel: No results for input(s): VITAMINB12, FOLATE, FERRITIN, TIBC, IRON, RETICCTPCT in the last 72 hours. Urine analysis: No results found for: COLORURINE, APPEARANCEUR, LABSPEC, PHURINE, GLUCOSEU, HGBUR, BILIRUBINUR, KETONESUR, PROTEINUR, UROBILINOGEN, NITRITE, LEUKOCYTESUR Sepsis Labs: @LABRCNTIP (procalcitonin:4,lacticidven:4)  )No results found for this or any previous visit (from the past 240 hour(s)).       Radiology Studies: No results found.      Scheduled Meds: . aspirin  81 mg Oral Daily  . furosemide  40 mg  Intravenous BID  . gabapentin  300 mg Oral TID  . insulin aspart  0-9 Units Subcutaneous TID WC  . insulin glargine  15 Units Subcutaneous QHS  . isosorbide mononitrate  30 mg Oral Daily  . metoprolol tartrate  25 mg Oral BID  . potassium chloride  20 mEq Oral BID  . rivaroxaban  20 mg Oral Q supper  . rosuvastatin  20 mg Oral QODAY  . sertraline  50 mg Oral Daily  . sodium chloride flush  3 mL Intravenous Q12H  . sodium chloride flush  3 mL Intravenous Q12H  . vitamin B-12  1,000 mcg Oral Daily   Continuous Infusions: . sodium chloride       LOS:  3 days    Time spent: [place], MD [facility] [phone]   If 7PM-7AM, please contact night-coverage [email or web address] Password [email or web address] 06/05/2017, 1:49 PM

## 2017-06-05 NOTE — Progress Notes (Signed)
Patient converted back into Afib rates 110-120's. Patient asymptomatic. Amio drip had been D/C'd today. Cardiology paged, order received to restart Amio with 150mg  bolus and initiate the drip protocol of 60 mg/hr then 30 mg/hr.

## 2017-06-05 NOTE — Care Management Note (Addendum)
Case Management Note  Patient Details  Name: Kathleen Griffith MRN: [id number] Date of Birth: [birth year]  Subjective/Objective:  Pt presented for NStemi- Afib RVR. Pt is from home- husband just went to SNF. Pt states son lives above her in an apartment. Per pt she has a cane and RW. Pt gets her medications fine without any problems.                  Action/Plan: CM discussed the need for possible 02. Staff RN to do another pulmonary note for qualifications. CM did speak with pt in regards to DME company and pt wants to use [facility]. Referral made and if qualifies DME to be delivered to room before d/c. CM will follow up on 06-06-17.   Expected Discharge Date:                  Expected Discharge Plan:  Home/Self Care  In-House Referral:  NA  Discharge planning Services  CM Consult  Post Acute Care Choice:  Durable Medical Equipment Choice offered to:  Patient  DME Arranged:  Oxygen DME Agency:  [place]:  NA [place] Agency:  NA  Status of Service:  Completed, signed off  If discussed at [place] of Stay Meetings, dates discussed:    Additional Comments: 1056 06-06-17 [address], [place] [phone] Oxygen to be delivered to room before d/c. Pt's son to assist with discharge transportation home. No further needs from CM at this time.  Bethena Roys, RN 06/05/2017, 4:41 PM

## 2017-06-05 NOTE — Progress Notes (Signed)
Progress Note  Patient Name: Kathleen Griffith Date of Encounter: 06/05/2017  Primary Cardiologist: Stanford Breed  Subjective   No cheat pain, denies shortness of breath but occ feels "smothery". Has mild pain in left calf, negative Homan's.  Inpatient Medications    Scheduled Meds: . aspirin  81 mg Oral Daily  . furosemide  20 mg Oral Daily  . gabapentin  300 mg Oral TID  . insulin aspart  0-9 Units Subcutaneous TID WC  . insulin glargine  15 Units Subcutaneous QHS  . isosorbide mononitrate  30 mg Oral Daily  . metoprolol tartrate  25 mg Oral BID  . rivaroxaban  20 mg Oral Q supper  . rosuvastatin  20 mg Oral QODAY  . sertraline  50 mg Oral Daily  . sodium chloride flush  3 mL Intravenous Q12H  . sodium chloride flush  3 mL Intravenous Q12H  . vitamin B-12  1,000 mcg Oral Daily   Continuous Infusions: . sodium chloride    . amiodarone     PRN Meds: sodium chloride, bisacodyl, HYDROcodone-acetaminophen, magnesium hydroxide, nitroGLYCERIN, ondansetron **OR** ondansetron (ZOFRAN) IV, sodium chloride flush   Vital Signs    Vitals:   06/04/17 1407 06/04/17 2056 06/05/17 0300 06/05/17 0500  BP: (!) 113/55 114/60 111/63   Pulse: 72 68 60   Resp: 14 15 16    Temp: 98.7 F (37.1 C) 98.4 F (36.9 C) 98.2 F (36.8 C)   TempSrc: Oral Oral Oral   SpO2: 100% 99%    Weight:    156 lb 8 oz (71 kg)  Height:    5\' 3"  (1.6 m)    Intake/Output Summary (Last 24 hours) at 06/05/17 0916 Last data filed at 06/05/17 0848  Gross per 24 hour  Intake            378.4 ml  Output             1300 ml  Net           -921.6 ml   Filed Weights   06/02/17 2035 06/04/17 0500 06/05/17 0500  Weight: 157 lb 14.4 oz (71.6 kg) 157 lb (71.2 kg) 156 lb 8 oz (71 kg)    Telemetry    Patient had atrial fibrillation in the rates of 100 to 120s for about 2 hours early this morning currently she is normal sinus rhythm in the 60s - Personally Reviewed  ECG    No new tracings  Physical Exam  Right  radial cath sight A  GEN: No acute distress.   Neck: No JVD Cardiac: RRR, no murmurs, rubs, or gallops.  Respiratory: Clear to auscultation bilaterally. GI: Soft, nontender, non-distended  MS: No edema; No deformity. Neuro:  Nonfocal  Psych: Normal affect   Labs    Chemistry  Recent Labs Lab 06/02/17 1556 06/03/17 0354 06/03/17 1033 06/05/17 0246  NA 141 141  --  136  K 4.0 3.4*  --  3.7  CL 107 103  --  97*  CO2 27 28  --  29  GLUCOSE 113* 111*  --  218*  BUN 21* 19  --  22*  CREATININE 0.87 0.96 0.97 1.14*  CALCIUM 8.6* 8.0*  --  7.7*  GFRNONAA >60 56* 55* 46*  GFRAA >60 >60 >60 53*  ANIONGAP 7 10  --  10     Hematology  Recent Labs Lab 06/03/17 0354 06/03/17 1033 06/04/17 0344  WBC 8.3 8.6 10.5  RBC 4.62 4.63 4.56  HGB 13.8 13.9  13.6  HCT 43.5 43.9 42.9  MCV 94.2 94.8 94.1  MCH 29.9 30.0 29.8  MCHC 31.7 31.7 31.7  RDW 16.1* 16.0* 15.9*  PLT 290 267 292    Cardiac EnzymesNo results for input(s): TROPONINI in the last 168 hours.   Recent Labs Lab 06/02/17 1608  TROPIPOC 0.41*     BNP  Recent Labs Lab 06/02/17 1556  BNP 2,200.0*     DDimer No results for input(s): DDIMER in the last 168 hours.   Radiology    No results found.  Cardiac Studies   Echo 06/02/17 Study Conclusions  - Left ventricle: The cavity size was normal. There was mild   concentric hypertrophy. Systolic function was severely reduced.   The estimated ejection fraction was in the range of 25% to 30%.   Features are consistent with a pseudonormal left ventricular   filling pattern, with concomitant abnormal relaxation and   increased filling pressure (grade 2 diastolic dysfunction).   Doppler parameters are consistent with elevated ventricular   end-diastolic filling pressure. - Mitral valve: Calcified annulus. Mildly thickened leaflets .   There was mild regurgitation. - Right ventricle: Systolic function was normal. - Right atrium: The atrium was normal in  size. - Tricuspid valve: There was mild regurgitation. - Pulmonary arteries: Systolic pressure was severely increased. PA   peak pressure: 59 mm Hg (S). - Pericardium, extracardiac: There was no pericardial effusion.  Impressions:  - LVEF 25-30%, there is akinesis of all of teh apical segments and   severe hypokinesis of the mid segments highly suspicious for a   stress induced cardiomyopathy.   There is heavy sludge in the left ventricular apex consistent   with a pre-thrombotic state.   RVSP 59 mmHg consistent with severe pulmonary hypertension. _______________________________________________________________________________  Left Heart Cath and Coronary Angiography 06/03/17  Conclusion    Prox LAD to Mid LAD lesion, 20 %stenosed.  LV end diastolic pressure is normal.   1. No obstructive CAD 2. Normal LV EDP  Plan: medical therapy for stress induced cardiomyopathy    Patient Profile     76 y.o. female with jaw/chest pain a week ago. Abnormal ECG at PCP office complained of dyspnea , LE edema and fatigue Admitted with subacute anterior MI, CHF and PAF  Assessment & Plan    1) MI:  Troponin 0.41, BNP 2200. Echo with EF 25-30%. Cath 6/5 with clear coronaries. Takatsubo DCM. Continue nitrates add beta blocker. Intolerant to ACE/ARB's. Concern for pre apical thrombus and with PAF will discharge on anticoagulation for 6 months until hopefully EF recovers 2) CHF:  EF 25-30%. Symptoms improved, continue lasix change to PO. Net 4L negative fluid balance.  3) DM she is concerned not on her jardiance per IM 4) PAF: Patient is usually managed with metoprolol 25 mg twice a day at home, maintaining sinus rhythm. Atrial fibrillation with RVR, pt had converted to sinus rhythm on IV amiodarone which was then discontinued. She developed afib with RVR early this morning and IV amio was restarted. May need addition of oral amiodarone at least through this acute episode of Takatsubo. (pending  Dr. Johnsie Cancel evaluation) CHA2DS2/VAS Stroke Risk Score 5 (HTN, Age (2), DM, female) Xarelto per [place], AGNP-C 06/05/2017  9:21 AM Pager: [phone]  Patient examined chart reviewed She is anxious to get home to ger cat. She still has sats under 90% without oxygen and still with rales on exam. I think some of this is COPD with superimposed CHF.  Give lasix iv today repeat CXR in am incentive spirometry Tentative d/c on am hopefully PAF with apical aneurysm will use DOAC for 6 months   [name]

## 2017-06-06 ENCOUNTER — Inpatient Hospital Stay (HOSPITAL_COMMUNITY): Payer: Medicare Other

## 2017-06-06 LAB — GLUCOSE, CAPILLARY
Glucose-Capillary: 184 mg/dL — ABNORMAL HIGH (ref 65–99)
Glucose-Capillary: 229 mg/dL — ABNORMAL HIGH (ref 65–99)

## 2017-06-06 LAB — BASIC METABOLIC PANEL
ANION GAP: 10 (ref 5–15)
BUN: 22 mg/dL — AB (ref 6–20)
CO2: 31 mmol/L (ref 22–32)
Calcium: 8.6 mg/dL — ABNORMAL LOW (ref 8.9–10.3)
Chloride: 96 mmol/L — ABNORMAL LOW (ref 101–111)
Creatinine, Ser: 0.93 mg/dL (ref 0.44–1.00)
GFR calc Af Amer: >60 mL/min (ref >60)
GFR, EST NON AFRICAN AMERICAN: 58 mL/min — AB (ref >60)
GLUCOSE: 147 mg/dL — AB (ref 65–99)
POTASSIUM: 4.6 mmol/L (ref 3.5–5.1)
Sodium: 137 mmol/L (ref 135–145)

## 2017-06-06 MED ORDER — FUROSEMIDE 10 MG/ML IJ SOLN
INTRAMUSCULAR | Status: AC
  Filled 2017-06-06: qty 4

## 2017-06-06 MED ORDER — METOPROLOL TARTRATE 25 MG PO TABS: 12.5000 mg | ORAL_TABLET | Freq: Two times a day (BID) | ORAL | 0 refills | Status: DC

## 2017-06-06 MED ORDER — METOPROLOL SUCCINATE ER 50 MG PO TB24
50.0000 mg | ORAL_TABLET | Freq: Every day | ORAL | Status: DC
  Administered 2017-06-06: 50 mg via ORAL
  Filled 2017-06-06: qty 1

## 2017-06-06 MED ORDER — INSULIN GLARGINE 100 UNIT/ML ~~LOC~~ SOLN: 15.0000 [IU] | Freq: Every day | SUBCUTANEOUS | 11 refills | Status: DC

## 2017-06-06 MED ORDER — FUROSEMIDE 40 MG PO TABS: 40.0000 mg | ORAL_TABLET | Freq: Two times a day (BID) | ORAL | 0 refills | Status: DC

## 2017-06-06 MED ORDER — METOPROLOL SUCCINATE ER 50 MG PO TB24: 50.0000 mg | ORAL_TABLET | Freq: Every day | ORAL | 1 refills | Status: DC

## 2017-06-06 MED ORDER — ISOSORBIDE MONONITRATE ER 30 MG PO TB24: 30.0000 mg | ORAL_TABLET | Freq: Every day | ORAL | 0 refills | Status: DC

## 2017-06-06 MED ORDER — FUROSEMIDE 10 MG/ML IJ SOLN: 80.0000 mg | Freq: Two times a day (BID) | INTRAMUSCULAR | Status: DC

## 2017-06-06 NOTE — Progress Notes (Addendum)
SATURATION QUALIFICATIONS: (This note is used to comply with regulatory documentation for home oxygen)  Patient Saturations on Room Air at Rest = 94 %  Patient Saturations on Room Air while Ambulating = 85 %  Patient Saturations on 2 Liters of oxygen while Ambulating =  92 %  Please briefly explain why patient needs home oxygen: O2 sat dropped while ambulating.  Alternate methods tried and failed patient will need home O2 Ferdinand Lango, RN

## 2017-06-06 NOTE — Care Management Important Message (Signed)
Important Message  Patient Details  Name: Kathleen Griffith MRN: [id number] Date of Birth: 1941-04-05   Medicare Important Message Given:  Yes    [name] 06/06/2017, 11:48 AM

## 2017-06-06 NOTE — Discharge Summary (Addendum)
Physician Discharge Summary  Kathleen Griffith [id number] DOB: 06-Jan-1941 DOA: 06/02/2017  PCP: Kathleen Panning, NP  Admit date: 06/02/2017 Discharge date: 06/06/2017  Time spent: 50 minutes  Recommendations for Outpatient Follow-up:  1. New medications this admission = Imdur 30, Lasix 40 twice a day, metoprolol 50 XL Qd 2. Reevaluate echocardiogram outpatient as per cardiologist-sludge noted in LV 3. Continue Xarelto 4. Needs outpatient portable DME oxygen-ordered for discharge 5. Needs outpatient A1c 3 months 6. Adjust insulin as needed outpatient   Discharge Diagnoses:  Principal Problem:   NSTEMI (non-ST elevated myocardial infarction) (Kathleen Griffith) Active Problems:   Paroxysmal atrial fibrillation (HCC)   Tobacco abuse   Essential hypertension   Chronic anticoagulation   Anemia, iron deficiency   Angiodysplasia of cecum   DM (diabetes mellitus) type II, controlled, with peripheral vascular disorder Kathleen Griffith)   Discharge Condition: Improved  Diet recommendation: HH/low-salt/DM TY2  Filed Weights   06/04/17 0500 06/05/17 0500 06/06/17 0500  Weight: 71.2 kg (157 lb) 71 kg (156 lb 8 oz) 70 kg (154 lb 4.8 oz)    History of present illness:   80 ? Retired Marine scientist from Kathleen Griffith score=4 Neg nuc study ~ 2006 Clinical COPD stage C Peripheral vascular disease ABIs normal 11/09/2015 however Moderate LICA stenosis 83-41% 07/18/2016  Admit 06/02/17 with subacute anterior wall MI, loss of R-wave new since EKG 7 11 2017 Troponin 0.4  Echo showed EF 20% Patient was admitted for subacute MI with ? acute ischemic cardiomyopathy cardiac cath was done 06/03/2017-proximal LAD-mid LAD lesion 20% stenosed             nonobstructive CAD seen             normal LVEDP   Griffith Course:  Acute systolic CHF Takotsubo cardiomyopathy-husban hospitalized day before her--EKG 6/4 shows prototypical st-t changes Echo consistent with? LV sludge-potential relation to stress  myopathy and may need repeat echo 1-2 weeks Left heart catheterization showing proximal LAD to mid LAD lesion, 20% stenosed with normal LV and diastolic pressure. medical therapy. Continue IV Lasix 40 mg twice a day for now--Cardiology changed dosing to 80 mg X4 and 6/8 and patient potentially can go home on 40 twice a day for now with very close OP FU Kathleen Griffith - Isordil 5 mg tid-Imdur 30 q24 Strict I/O -3.9 l since admit Patient insistent on discharge, unaware of salt restriction, multiple bottles and jogs in the room so education will be given prior to discharge in written form regarding fluid restriction and will need to be followed up as an outpatient Continue daily aspirin, metoprolol and statin.  Allergy to ACEi/ ARB. 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 Offer nicotine patch  Essential hypertension Stable. Continue home medications  DM (diabetes mellitus) type II, controlled, with peripheral vascular disorder (HCC) A1c this admit 9.4 Continue Lantus 15 units at bedtime/home meds are significantly higher  Sensitive sliding scale  coverage. Blood sugars 172-223 Resume oral hypoglycemics post Griffith  Bipolar Continue sertraline 50 daily at bedtime on discharge  Procedures:   Echocardiogram  Cardiac catheterization   Consultations:   cardiology  Discharge Exam: Vitals:    06/05/17 2117 06/06/17 0500  BP: (!) 149/101 (!) 121/44  Pulse: 60 (!) 54  Resp:  13  Temp:  97.9 F (36.6 C)    General: eomi ncat mild JVD, crackles  Cardiovascular:  s1 s2 HSM No abd discomfort Respiratory:cta b  Discharge Instructions    Current Discharge Medication List    START taking these medications   Details  furosemide (LASIX) 40 MG tablet Take 1 tablet (40 mg total) by mouth 2 (two) times daily. Qty: 30 tablet, Refills: 0    isosorbide mononitrate (IMDUR) 30 MG 24 hr tablet Take 1 tablet (30 mg total) by mouth daily. Qty: 30 tablet, Refills: 0    metoprolol succinate (TOPROL-XL) 50 MG 24 hr tablet Take 1 tablet (50 mg total) by mouth daily. Take with or immediately following a meal. Qty: 30 tablet, Refills: 1      CONTINUE these medications which have CHANGED   Details  insulin glargine (LANTUS) 100 UNIT/ML injection Inject 0.15 mLs (15 Units total) into the skin at bedtime. Qty: 10 mL, Refills: 11      CONTINUE these medications which have NOT CHANGED   Details  acetaminophen (TYLENOL) 500 MG tablet Take 1,000 mg by mouth every 6 (six) hours as needed for mild pain.     Cholecalciferol (VITAMIN D3) 1000 UNITS CAPS Take 1 tablet by mouth daily.    empagliflozin (JARDIANCE) 25 MG TABS tablet Take 25 mg by mouth daily.    gabapentin (NEURONTIN) 300 MG capsule Take 300 mg by mouth 3 (three) times daily.     Multiple Vitamins-Minerals (CENTRUM SILVER PO) Take 1 tablet by mouth daily.    rivaroxaban (XARELTO) 20 MG TABS tablet Take 20 mg by mouth daily with supper.    rosuvastatin (CRESTOR) 20 MG tablet Take 20 mg by mouth every other day.     sertraline (ZOLOFT) 50 MG tablet Take 50 mg by mouth daily.     vitamin B-12 (CYANOCOBALAMIN) 1000 MCG tablet Take 1,000 mcg by mouth daily.      STOP taking these medications     metoprolol tartrate (LOPRESSOR) 25 MG tablet        Allergies  Allergen Reactions  . Metoclopramide Other (See Comments)     Tardive dyskinesia  . Lisinopril     cough  . Lyrica [Pregabalin] Swelling    TO LOWER EXTREMTIES  . Losartan     wheezing  . Metformin And Related Diarrhea   Follow-up Information    Kathleen Griffith, [place] Follow up.   Specialties:  Cardiology, Radiology Why:  On June 20 at 10:30 for cardiology Griffith follow up.  Contact information: [address] [place] [address] [phone]            The results of significant diagnostics from this hospitalization (including imaging, microbiology, ancillary and laboratory) are listed below for reference.    Significant Diagnostic Studies: Dg Chest 2 View  Result Date: 06/06/2017 CLINICAL DATA:  Follow-up congestive failure, shortness of breath for several weeks EXAM: CHEST  2 VIEW COMPARISON:  06/02/2017 FINDINGS: Cardiac shadow is again enlarged. Vascular congestion is again noted and stable. Improving aeration in the left base is noted although some persistent infiltrate seen. No new focal abnormality is noted. IMPRESSION: Improved  infiltrate in the left base. Persisting congestive changes are noted. Electronically Signed   By: Inez Catalina M.D.   On: 06/06/2017 07:51   Dg Chest Portable 1 View  Result Date: 06/02/2017 CLINICAL DATA:  Dyspnea EXAM: PORTABLE CHEST 1 VIEW COMPARISON:  03/18/2016 CXR, 03/07/2016 chest CT FINDINGS: Top normal size cardiac chambers. Aortic atherosclerosis without aneurysm. Central pulmonary vascular redistribution noted consistent CHF. Superimposed pneumonia at the left lung base is not entirely excluded. Stable calcified nodule in the left lower lobe consistent with a granuloma. Chronic right posterior fourth through sixth rib fractures. IMPRESSION: 1. Findings suggest CHF. Superimposed pneumonia at the left lung base is not entirely excluded. 2. Aortic atherosclerosis. 3. Left lower lobe calcified granuloma. 4. Chronic right fourth through sixth rib fractures. Electronically Signed   By: Ashley Royalty  M.D.   On: 06/02/2017 21:33    Microbiology: No results found for this or any previous visit (from the past 240 hour(s)).   Labs: Basic Metabolic Panel:  Recent Labs Lab 06/02/17 1556 06/03/17 0354 06/03/17 1033 06/05/17 0246 06/05/17 1112 06/06/17 0405  NA 141 141  --  136 137 137  K 4.0 3.4*  --  3.7 3.7 4.6  CL 107 103  --  97* 98* 96*  CO2 27 28  --  29 30 31   GLUCOSE 113* 111*  --  218* 237* 147*  BUN 21* 19  --  22* 22* 22*  CREATININE 0.87 0.96 0.97 1.14* 1.00 0.93  CALCIUM 8.6* 8.0*  --  7.7* 7.9* 8.6*  MG  --   --   --  2.0  --   --    Liver Function Tests: No results for input(s): AST, ALT, ALKPHOS, BILITOT, PROT, ALBUMIN in the last 168 hours. No results for input(s): LIPASE, AMYLASE in the last 168 hours. No results for input(s): AMMONIA in the last 168 hours. CBC:  Recent Labs Lab 06/02/17 1556 06/03/17 0354 06/03/17 1033 06/04/17 0344  WBC 10.3 8.3 8.6 10.5  HGB 14.6 13.8 13.9 13.6  HCT 44.8 43.5 43.9 42.9  MCV 94.5 94.2 94.8 94.1  PLT 283 290 267 292   Cardiac Enzymes: No results for input(s): CKTOTAL, CKMB, CKMBINDEX, TROPONINI in the last 168 hours. BNP: BNP (last 3 results)  Recent Labs  06/02/17 1556  BNP 2,200.0*    ProBNP (last 3 results) No results for input(s): PROBNP in the last 8760 hours.  CBG:  Recent Labs Lab 06/05/17 0756 06/05/17 1144 06/05/17 1708 06/05/17 2047 06/06/17 0721  GLUCAP 172* 223* 173* 208* 184*       Signed:  [name] MD   [facility] 06/06/2017, 9:15 AM

## 2017-06-06 NOTE — Progress Notes (Signed)
Progress Note  Patient Name: Kathleen Griffith Date of Encounter: 06/06/2017  Primary Cardiologist: B. Crenshaw, MD   Subjective   No chest pain.  Walked yesterday - dropped O2 sats and was placed back on O2.  Very eager to go home.  Inpatient Medications    Scheduled Meds: . aspirin  81 mg Oral Daily  . furosemide  40 mg Intravenous BID  . gabapentin  300 mg Oral TID  . insulin aspart  0-9 Units Subcutaneous TID WC  . insulin glargine  15 Units Subcutaneous QHS  . isosorbide mononitrate  30 mg Oral Daily  . metoprolol tartrate  25 mg Oral BID  . potassium chloride  20 mEq Oral BID  . rivaroxaban  20 mg Oral Q supper  . rosuvastatin  20 mg Oral QODAY  . sertraline  50 mg Oral Daily  . sodium chloride flush  3 mL Intravenous Q12H  . sodium chloride flush  3 mL Intravenous Q12H  . vitamin B-12  1,000 mcg Oral Daily   Continuous Infusions: . sodium chloride     PRN Meds: sodium chloride, bisacodyl, HYDROcodone-acetaminophen, magnesium hydroxide, nitroGLYCERIN, ondansetron **OR** ondansetron (ZOFRAN) IV, sodium chloride flush   Vital Signs    Vitals:   06/05/17 1403 06/05/17 2010 06/05/17 2117 06/06/17 0500  BP: (!) 96/45 (!) 126/57 (!) 149/101 (!) 121/44  Pulse: (!) 48 71 60 (!) 54  Resp: 17 19  13   Temp: 98 F (36.7 C) 98 F (36.7 C)  97.9 F (36.6 C)  TempSrc: Oral Oral  Oral  SpO2: (!) 2% 98%  99%  Weight:    154 lb 4.8 oz (70 kg)  Height:        Intake/Output Summary (Last 24 hours) at 06/06/17 0836 Last data filed at 06/06/17 0500  Gross per 24 hour  Intake            598.4 ml  Output             2400 ml  Net          -1801.6 ml   Filed Weights   06/04/17 0500 06/05/17 0500 06/06/17 0500  Weight: 157 lb (71.2 kg) 156 lb 8 oz (71 kg) 154 lb 4.8 oz (70 kg)    Physical Exam   GEN: Well nourished, well developed, in no acute distress.  HEENT: Grossly normal.  Neck: Supple, no JVD, carotid bruits, or masses. Cardiac: RRR, no murmurs, rubs, or gallops.  No clubbing, cyanosis, edema.  Radials/DP/PT 2+ and equal bilaterally.  Respiratory:  Respirations regular and unlabored, bibasilar crackles. GI: Soft, nontender, nondistended, BS + x 4. MS: no deformity or atrophy. Skin: warm and dry, no rash. Neuro:  Strength and sensation are intact. Psych: AAOx3.  Normal affect.  Labs    Chemistry Recent Labs Lab 06/05/17 0246 06/05/17 1112 06/06/17 0405  NA 136 137 137  K 3.7 3.7 4.6  CL 97* 98* 96*  CO2 29 30 31   GLUCOSE 218* 237* 147*  BUN 22* 22* 22*  CREATININE 1.14* 1.00 0.93  CALCIUM 7.7* 7.9* 8.6*  GFRNONAA 46* 53* 58*  GFRAA 53* >60 >60  ANIONGAP 10 9 10      Hematology Recent Labs Lab 06/03/17 0354 06/03/17 1033 06/04/17 0344  WBC 8.3 8.6 10.5  RBC 4.62 4.63 4.56  HGB 13.8 13.9 13.6  HCT 43.5 43.9 42.9  MCV 94.2 94.8 94.1  MCH 29.9 30.0 29.8  MCHC 31.7 31.7 31.7  RDW 16.1* 16.0* 15.9*  PLT  290 267 292     Recent Labs Lab 06/02/17 1608  TROPIPOC 0.41*     BNP Recent Labs Lab 06/02/17 1556  BNP 2,200.0*     Radiology    Dg Chest 2 View  Result Date: 06/06/2017 CLINICAL DATA:  Follow-up congestive failure, shortness of breath for several weeks EXAM: CHEST  2 VIEW COMPARISON:  06/02/2017 FINDINGS: Cardiac shadow is again enlarged. Vascular congestion is again noted and stable. Improving aeration in the left base is noted although some persistent infiltrate seen. No new focal abnormality is noted. IMPRESSION: Improved infiltrate in the left base. Persisting congestive changes are noted. Electronically Signed   By: Inez Catalina M.D.   On: 06/06/2017 07:51    Telemetry    Sinus rhythm/sinus brady, brief runs of atrial tach, no afib. - Personally Reviewed  Cardiac Studies   Echo 06/02/17 Study Conclusions   - Left ventricle: The cavity size was normal. There was mild   concentric hypertrophy. Systolic function was severely reduced.   The estimated ejection fraction was in the range of 25% to 30%.    Features are consistent with a pseudonormal left ventricular   filling pattern, with concomitant abnormal relaxation and   increased filling pressure (grade 2 diastolic dysfunction).   Doppler parameters are consistent with elevated ventricular   end-diastolic filling pressure. - Mitral valve: Calcified annulus. Mildly thickened leaflets .   There was mild regurgitation. - Right ventricle: Systolic function was normal. - Right atrium: The atrium was normal in size. - Tricuspid valve: There was mild regurgitation. - Pulmonary arteries: Systolic pressure was severely increased. PA   peak pressure: 59 mm Hg (S). - Pericardium, extracardiac: There was no pericardial effusion.   Impressions:   - LVEF 25-30%, there is akinesis of all of teh apical segments and   severe hypokinesis of the mid segments highly suspicious for a   stress induced cardiomyopathy.   There is heavy sludge in the left ventricular apex consistent   with a pre-thrombotic state.   RVSP 59 mmHg consistent with severe pulmonary hypertension. _____________   Left Heart Cath and Coronary Angiography 06/03/17  Conclusion     Prox LAD to Mid LAD lesion, 20 %stenosed.  LV end diastolic pressure is normal.   1. No obstructive CAD 2. Normal LV EDP   Plan: medical therapy for stress induced cardiomyopathy     Patient Profile     76 y.o. female w/ a h/o PAF on xarelto, admitted 6/4 with a several wk h/o DOE and LEE, found to have an abnl ECG with new ant infarct  LV dysfxn (25-30%) w/ ? Of LV sludge - precursor to thrombus  nl cors on cath  presumed takotsubo CM.  Assessment & Plan    1.  Takatsubo CM/acute systolic CHF:  Pt admitted 6/4 w/ dyspnea, LEE, and what appeared to be a subacute anterior MI by ECG.  Trop elev @ 0.41.  Cath showed nl cors.  Echo showed EF 25-30% with heavy sludge in LV apex consistent w/ pre-thrombotic state (despite chronic xarelto Rx). She has not had any chest pain.  She has ambulated some  and still requires O2 to maintain O2 sat > 88%.  She has crackles on exam.  EDP was nl @ time of cath.  No JVD.  Vasc congestion on CXR this am.  She was started on lasix 40 IV bid last night.  Minus 1.8 L overnight.  Wt down 2 lbs since yesterday.  She is  very eager to go home, however I think she needs further diuresis and ideally we should try and get her off of O2, which she was not on @ home previously.  Will escalate lasix dose this am.  Cont  blocker and switch to toprol xl.  She is not on acei due to h/o cough.  No ARB 2/2 wheezing with losartan.  Cont nitrate.  BP is variable.  I am not sure that she would tolerate additional afterload reduction w/ hydralazine.  2.  PAF:  Maintaining sinus.  She has a prior h/o PAF.  She says she is usually asymp unless rates get very high.  Cont  blocker and consolidate.  Cont OAC.  Discussed w/ Dr. Johnsie Cancel - in absence of formal thrombus, no need to change to warfarin.  3.  LV sludge:  Sludge noted in LV apex on echo.  Of note, pt is on chronic xarelto @ home in setting of PAF, thus sludge presumably developed while on [place].    4.  DM II:  Per IM.  5.  HL:  Cont statin.  F/u lipids as outpt.   Signed, Murray Hodgkins, NP  06/06/2017, 8:36 AM

## 2017-06-07 NOTE — Progress Notes (Signed)
Received a telephone call from patient stating she was discharged home on lasix without potassium prescription.  Patient requesting we call physician to verify she does not need to be on Potassium prescription at home.  Spoke with Dr. Verlon Au who stated patient did not need to be on potassium at home and to increase her home diet of potassium.  Called patient at [phone] and updated her on physicians recommendations.  Also informed her to follow up with her primary MD in 1-2 weeks, monitor signs and symptoms for low potassium and foods high in potassium.  Instructed her if she felt the need for potassium prescription to call her Cardiologist office.  Patient stated her understanding.  Sanda Linger

## 2017-06-16 ENCOUNTER — Telehealth: Payer: Self-pay | Admitting: Cardiology

## 2017-06-16 NOTE — Telephone Encounter (Signed)
New Message  Pt call requesting to speak with RN about canceled appt on 6/20 with [facility]. Please call back to discuss

## 2017-06-16 NOTE — Telephone Encounter (Signed)
Spoke with pt she states that she NSTEMI and L heart cath  06-03-17 she states that she "received no direction on what to do after her recent hospitalization. Pt states that she is doing fine but would like to come in for appt and discuss her limitations/medications, she states that she is confused. Appt scheduled Barrett, PA 06-24-17 330 pm to discuss she states that she will "take it easy until appt" and if she has any issues she will go to the ER as needed.

## 2017-06-18 ENCOUNTER — Ambulatory Visit: Payer: Medicare Other | Admitting: Physician Assistant

## 2017-06-24 ENCOUNTER — Encounter: Payer: Self-pay | Admitting: Physician Assistant

## 2017-06-24 ENCOUNTER — Ambulatory Visit (INDEPENDENT_AMBULATORY_CARE_PROVIDER_SITE_OTHER): Payer: Medicare Other | Admitting: Physician Assistant

## 2017-06-24 ENCOUNTER — Other Ambulatory Visit: Payer: Self-pay | Admitting: Physician Assistant

## 2017-06-24 VITALS — BP 102/60 | HR 70 | Ht 63.0 in | Wt 153.0 lb

## 2017-06-24 DIAGNOSIS — I480 Paroxysmal atrial fibrillation: Secondary | ICD-10-CM | POA: Diagnosis not present

## 2017-06-24 DIAGNOSIS — Z7901 Long term (current) use of anticoagulants: Secondary | ICD-10-CM

## 2017-06-24 DIAGNOSIS — I5181 Takotsubo syndrome: Secondary | ICD-10-CM

## 2017-06-24 DIAGNOSIS — I5022 Chronic systolic (congestive) heart failure: Secondary | ICD-10-CM

## 2017-06-24 MED ORDER — POTASSIUM CHLORIDE ER 10 MEQ PO TBCR: 10.0000 meq | EXTENDED_RELEASE_TABLET | Freq: Every day | ORAL | Status: DC

## 2017-06-24 NOTE — Patient Instructions (Signed)
Medication Instructions:  OK TO TAKE AN EXTRA 1/2 TAB LASIX (20MG ) IF DAILY WEIGHT IS >3 POUNDS IN ONE DAY -OR >5 POUNDS IN ONE WEEK  If you need a refill on your cardiac medications before your next appointment, please call your pharmacy.  Follow-Up: Your physician wants you to follow-up:  WITH DR Stanford Breed 07-17-2017 @ 1 PM   Special Instructions: CONTINUE DAILY WEIGHTS LIMIT SODIUM TO 2,000 MG DAILY  Thank you for choosing [facility] at [facility]!!

## 2017-06-24 NOTE — Progress Notes (Signed)
Cardiology Office Note   Date:  06/24/2017   ID:  Kathleen Griffith, DOB 1941-11-24, MRN [id number]  PCP:  Bernerd Limbo, MD  Cardiologist:  Dr. Stanford Breed 10/2016 Rosaria Ferries, PA-C   Chief Complaint  Patient presents with  . Follow-up    pt c/o lack of energy     History of Present Illness: Kathleen Griffith is a 76 y.o. female with a history of PAF on Xarelto, tob use, anemia, DM, HTN, HLD, PMR, PAD.  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  DC on Lasix 40 mg twice a day, Toprol-XL 50 and Imdur 30, Crestor 20 mg, no ASA secondary to the Xarelto, no ACE 2nd cough, no ARB secondary to wheezing with losartan  Kathleen Griffith presents for cardiology follow up.  She is struggling with a husband who is almost bed-ridden on HD, currently in a rehab facility. Her son had CABG and her daughter-in-law had multiple PEs, so she had no help with her husband as well as 2 additional family members that needed help.   Since she was discharged, she has had some problems. She was very tired and just had no energy. She finally felt able to clean a little. She got a small O2 concentrator so she could get out more. She checks her PO2 and it will drop to 87% if she is up and about but is > 92% if on O2.   She never had chest pain before admission, has not had it since.  She has not had palpitations, does not think she has been in afib.  On her scales, her weight was 145.5 at d/c, she went to 147 and is now 148. She feels most of the weight is from eating more. She feels her respiratory status is improving. She is not using O2 at rest, but is using it with exertion and at night.   She is still smoking, but trying to quit, uses Nicorette gum.  She was on KCL in the hospital, was not at d/c. Dr Coletta Memos checked her K+ and it was low, she was started on supplement.    She is willing to increase her activity, but is limited by back problems. She gets periodic injections.   Past Medical History:  Diagnosis Date  . Anemia    microcytic   . Arthritis   . Atrial fibrillation ([place])   . Cataract    REMOVED BILATERAL  . Colon polyps   . Diabetes mellitus ([place])   . Fibromyalgia   . Gastroparesis   . Hyperlipidemia   . Hypertension   . Kidney stones   . Melanoma ([place])   . PMR (polymyalgia rheumatica) ([facility])   . PVD (peripheral vascular disease) ([place])     Past Surgical History:  Procedure Laterality Date  . APPENDECTOMY    . BREAST BIOPSY    . CESAREAN SECTION    . COLONOSCOPY    . COLONOSCOPY WITH PROPOFOL N/A 12/26/2015   Procedure: COLONOSCOPY WITH PROPOFOL;  Surgeon: Manus Gunning, MD;  Location: [facility];  Service: Gastroenterology;  Laterality: N/A;  . ESOPHAGOGASTRODUODENOSCOPY (EGD) WITH PROPOFOL N/A 12/26/2015   Procedure: ESOPHAGOGASTRODUODENOSCOPY (EGD) WITH PROPOFOL;  Surgeon: Manus Gunning, MD;  Location: [facility];  Service: Gastroenterology;  Laterality: N/A;  . LEFT HEART CATH AND CORONARY ANGIOGRAPHY N/A 06/03/2017   Procedure: Left Heart Cath and Coronary Angiography;  Surgeon:  Martinique, Peter M, MD;  Location: [facility];  Service: Cardiovascular;  Laterality: N/A;  . POLYPECTOMY      Current Outpatient Prescriptions  Medication Sig Dispense Refill  . acetaminophen (TYLENOL) 500 MG tablet Take 1,000 mg by mouth every 6 (six) hours as needed for mild pain.     . Cholecalciferol (VITAMIN D3) 1000 UNITS CAPS Take 1 tablet by mouth daily.    . empagliflozin (JARDIANCE) 25 MG TABS tablet Take 25 mg by mouth daily.    . furosemide (LASIX) 40 MG tablet Take 1 tablet (40 mg total) by mouth 2 (two) times daily. (Patient taking differently: Take 20 mg by mouth 2 (two) times daily. ) 30 tablet 0  . gabapentin (NEURONTIN) 300 MG capsule Take 300 mg by mouth 3 (three) times daily.     . insulin glargine  (LANTUS) 100 UNIT/ML injection Inject 0.15 mLs (15 Units total) into the skin at bedtime. 10 mL 11  . isosorbide mononitrate (IMDUR) 30 MG 24 hr tablet Take 1 tablet (30 mg total) by mouth daily. 30 tablet 0  . metoprolol succinate (TOPROL-XL) 50 MG 24 hr tablet Take 1 tablet (50 mg total) by mouth daily. Take with or immediately following a meal. 30 tablet 1  . Multiple Vitamins-Minerals (CENTRUM SILVER PO) Take 1 tablet by mouth daily.    . rivaroxaban (XARELTO) 20 MG TABS tablet Take 20 mg by mouth daily with supper.    . rosuvastatin (CRESTOR) 20 MG tablet Take 20 mg by mouth every other day.     . sertraline (ZOLOFT) 50 MG tablet Take 50 mg by mouth daily.     . vitamin B-12 (CYANOCOBALAMIN) 1000 MCG tablet Take 1,000 mcg by mouth daily.     No current facility-administered medications for this visit.     Allergies:   Metoclopramide; Lisinopril; Lyrica [pregabalin]; Losartan; and Metformin and related    Social History:  The patient  reports that she has been smoking Cigarettes.  She has been smoking about 1.00 pack per day. She has never used smokeless tobacco. She reports that she does not drink alcohol or use drugs.   Family History:  The patient's family history includes CAD in her father; Colon cancer (age of onset: 59) in her mother; Diabetes in her father; Heart disease in her father.    ROS:  Please see the history of present illness. All other systems are reviewed and negative.    PHYSICAL EXAM: VS:  BP 102/60 (BP Location: Right Arm, Patient Position: Sitting, Cuff Size: Normal)   Pulse 70   Ht 5\' 3"  (1.6 m)   Wt 153 lb (69.4 kg)   BMI 27.10 kg/m  , BMI Body mass index is 27.1 kg/m. GEN: Well nourished, well developed, female in no acute distress  HEENT: normal for age  Neck: minimal JVD, negative HJR, no carotid bruit, no masses Cardiac: RRR; 2/6 murmur, no rubs, or gallops Respiratory:  clear to auscultation bilaterally, normal work of breathing GI: soft,  nontender, nondistended, + BS MS: no deformity or atrophy; no edema; distal pulses are 2+ in all 4 extremities   Skin: warm and dry, no rash Neuro:  Strength and sensation are intact Psych: euthymic mood, full affect   EKG:  EKG is not ordered today.  Echo 06/02/17 Study Conclusions - Left ventricle: The cavity size was normal. There was mild concentric hypertrophy. Systolic function was severely reduced. The estimated ejection fraction was in the range of 25% to 30%. Features  are consistent with a pseudonormal left ventricular filling pattern, with concomitant abnormal relaxation and increased filling pressure (grade 2 diastolic dysfunction). Doppler parameters are consistent with elevated ventricular end-diastolic filling pressure. - Mitral valve: Calcified annulus. Mildly thickened leaflets . There was mild regurgitation. - Right ventricle: Systolic function was normal. - Right atrium: The atrium was normal in size. - Tricuspid valve: There was mild regurgitation. - Pulmonary arteries: Systolic pressure was severely increased. PA peak pressure: 59 mm Hg (S). - Pericardium, extracardiac: There was no pericardial effusion. Impressions: - LVEF 25-30%, there is akinesis of all of teh apical segments and severe hypokinesis of the mid segments highly suspicious for a stress induced cardiomyopathy. There is heavy sludge in the left ventricular apex consistent with a pre-thrombotic state. RVSP 59 mmHg consistent with severe pulmonary hypertension. _____________   Left Heart Cath and Coronary Angiography 06/03/17  Conclusion    Prox LAD to Mid LAD lesion, 20 %stenosed.  LV end diastolic pressure is normal. 1. No obstructive CAD 2. Normal LV EDP Plan: medical therapy for stress induced cardiomyopathy    Recent Labs: 06/02/2017: B Natriuretic Peptide 2,200.0; TSH 1.128 06/04/2017: Hemoglobin 13.6; Platelets 292 06/05/2017: Magnesium 2.0 06/06/2017:  BUN 22; Creatinine, Ser 0.93; Potassium 4.6; Sodium 137    Lipid Panel No results found for: CHOL, TRIG, HDL, CHOLHDL, VLDL, LDLCALC, LDLDIRECT   Wt Readings from Last 3 Encounters:  06/24/17 153 lb (69.4 kg)  06/06/17 154 lb 4.8 oz (70 kg)  11/05/16 156 lb (70.8 kg)     Other studies Reviewed: Additional studies/ records that were reviewed today include: office notes, hospital records and testing.  ASSESSMENT AND PLAN:  1.  Chronic systolic CHF: Pt weight is relatively stable, she has gained a little but says is eating more. No sx of worsening CHF. Believe part of her O2 needs are 2nd chronic lung dz from tob use. Continue current Lasix and K+ doses, daily weights and 2 gm Na DM diet. F/u with Dr Stanford Breed as scheduled.   2. Takotsubo CM: She is on Imdur, BB and statin. BP will not tolerate dose increases. No ASA 2nd Xarelto. Sludge, ?thrombus precursor seen on echo, continue Xarelto.  3. PAF: No palpitations, continue BB  4. Chronic anticoagulation: no bleeding on the Xarelto, no doses missed.   Current medicines are reviewed at length with the patient today.  The patient does not have concerns regarding medicines.  The following changes have been made:  no change  Labs/ tests ordered today include:  No orders of the defined types were placed in this encounter.    Disposition:   FU with Dr Stanford Breed  Signed, Rosaria Ferries, PA-C  06/24/2017 3:59 PM    [facility] Phone: [phone]; Fax: [phone]  This note was written with the assistance of speech recognition software. Please excuse any transcriptional errors.

## 2017-07-08 NOTE — Progress Notes (Signed)
HPI: FU atrial fibrillation. She has a history of paroxysmal atrial fibrillation. Abdominal ultrasound May 2016 showed no aneurysm. ABIs November 2016 normal. Echocardiogram March 2017 showed normal LV systolic function. Carotid Dopplers July 2017 showed 40-59% left stenosis and 1-39% right. Patient admitted with non-ST elevation myocardial infarction in June 2018. Cardiac catheterization revealed a 20% proximal to mid LAD and normal left ventricular end-diastolic pressure. Echocardiogram showed ejection fraction 97-98%, grade 2 diastolic dysfunction, mild tricuspid regurgitation, severely elevated pulmonary pressure. Sludge noted LV apex. 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 Since last seen, she has mild dyspnea on exertion but no orthopnea, PND, pedal edema, exertional chest pain. Some dizziness with standing.  Current Outpatient Prescriptions  Medication Sig Dispense Refill  . acetaminophen (TYLENOL) 500 MG tablet Take 1,000 mg by mouth every 6 (six) hours as needed for mild pain.     . Cholecalciferol (VITAMIN D3) 1000 UNITS CAPS Take 1 tablet by mouth daily.    . empagliflozin (JARDIANCE) 25 MG TABS tablet Take 25 mg by mouth daily.    . furosemide (LASIX) 40 MG tablet Take 1 tablet (40 mg total) by mouth 2 (two) times daily. (Patient taking differently: Take 20 mg by mouth 2 (two) times daily. ) 30 tablet 0  . gabapentin (NEURONTIN) 300 MG capsule Take 300 mg by mouth 3 (three) times daily.     . insulin glargine (LANTUS) 100 UNIT/ML injection Inject 0.15 mLs (15 Units total) into the skin at bedtime. 10 mL 11  . isosorbide mononitrate (IMDUR) 30 MG 24 hr tablet Take 1 tablet (30 mg total) by mouth daily. 30 tablet 0  . metoprolol succinate (TOPROL-XL) 50 MG 24 hr tablet Take 1 tablet (50 mg total) by mouth daily. Take with or immediately following a meal. 30 tablet 1  . Multiple Vitamins-Minerals (CENTRUM SILVER PO) Take 1 tablet by mouth daily.    . potassium chloride  (K-DUR) 10 MEQ tablet Take 1 tablet (10 mEq total) by mouth daily.    . rivaroxaban (XARELTO) 20 MG TABS tablet Take 20 mg by mouth daily with supper.    . rosuvastatin (CRESTOR) 20 MG tablet Take 20 mg by mouth every other day.     . sertraline (ZOLOFT) 50 MG tablet Take 50 mg by mouth daily.     . vitamin B-12 (CYANOCOBALAMIN) 1000 MCG tablet Take 1,000 mcg by mouth daily.     No current facility-administered medications for this visit.      Past Medical History:  Diagnosis Date  . Anemia    microcytic   . Arthritis   . Atrial fibrillation ([place])   . Cataract    REMOVED BILATERAL  . Colon polyps   . Diabetes mellitus ([place])   . Fibromyalgia   . Gastroparesis   . Hyperlipidemia   . Hypertension   . Kidney stones   . Melanoma ([place])   . PMR (polymyalgia rheumatica) ([facility])   . PVD (peripheral vascular disease) ([place])     Past Surgical History:  Procedure Laterality Date  . APPENDECTOMY    . BREAST BIOPSY    . CESAREAN SECTION    . COLONOSCOPY    . COLONOSCOPY WITH PROPOFOL N/A 12/26/2015   Procedure: COLONOSCOPY WITH PROPOFOL;  Surgeon: Manus Gunning, MD;  Location: [facility];  Service: Gastroenterology;  Laterality: N/A;  . ESOPHAGOGASTRODUODENOSCOPY (EGD) WITH PROPOFOL N/A 12/26/2015   Procedure: ESOPHAGOGASTRODUODENOSCOPY (EGD) WITH PROPOFOL;  Surgeon: Manus Gunning, MD;  Location[facility];  Service: Gastroenterology;  Laterality: N/A;  . LEFT HEART CATH AND CORONARY ANGIOGRAPHY N/A 06/03/2017   Procedure: Left Heart Cath and Coronary Angiography;  Surgeon: Martinique, Peter M, MD;  Location: [facility];  Service: Cardiovascular;  Laterality: N/A;  . POLYPECTOMY      Social History   Social History  . Marital status: Married    Spouse name: N/A  . Number of children: 1  . Years of education: N/A   Occupational History  . Not on file.   Social History Main Topics  . Smoking status: Current Every Day Smoker    Packs/day: 1.00    Types:  Cigarettes  . Smokeless tobacco: Never Used  . Alcohol use No  . Drug use: No  . Sexual activity: Not on file   Other Topics Concern  . Not on file   Social History Narrative  . No narrative on file    Family History  Problem Relation Age of Onset  . CAD Father        MI and CABG  . Diabetes Father   . Heart disease Father   . Colon cancer Mother 63    ROS: no fevers or chills, productive cough, hemoptysis, dysphasia, odynophagia, melena, hematochezia, dysuria, hematuria, rash, seizure activity, orthopnea, PND, pedal edema, claudication. Remaining systems are negative.  Physical Exam: Well-developed well-nourished in no acute distress.  Skin is warm and dry.  HEENT is normal.  Neck is supple.  Chest with diminished BS Cardiovascular exam is regular rate and rhythm.  Abdominal exam nontender or distended. No masses palpated. Extremities show no edema. neuro grossly intact   A/P  1 Paroxysmal atrial fibrillation-patient remains in sinus rhythm on exam. Continue Toprol for rate control if atrial fibrillation recurs. Continue xarelto.  2 recent non-ST elevation myocardial infarction-cardiac catheterization suggested takotsubo CM. Plan to repeat echocardiogram to see if LV function has improved.   3 carotid artery disease-we will arrange follow-up carotid Dopplers.  4 tobacco abuse-patient counseled on discontinuing.  5 hypertension-she has had mild orthostatic symptoms. She states her blood pressure is running low. Discontinue isosorbide and follow.  6 hyperlipidemia-continue statin.  7 peripheral vascular disease-continue statin. No aspirin given need for anticoagulation.   Kirk Ruths, MD

## 2017-07-17 ENCOUNTER — Encounter: Payer: Self-pay | Admitting: Cardiology

## 2017-07-17 ENCOUNTER — Ambulatory Visit (INDEPENDENT_AMBULATORY_CARE_PROVIDER_SITE_OTHER): Payer: Medicare Other | Admitting: Cardiology

## 2017-07-17 VITALS — BP 102/54 | HR 64 | Ht 63.0 in | Wt 151.0 lb

## 2017-07-17 DIAGNOSIS — I480 Paroxysmal atrial fibrillation: Secondary | ICD-10-CM | POA: Diagnosis not present

## 2017-07-17 DIAGNOSIS — I10 Essential (primary) hypertension: Secondary | ICD-10-CM

## 2017-07-17 DIAGNOSIS — I5181 Takotsubo syndrome: Secondary | ICD-10-CM

## 2017-07-17 DIAGNOSIS — I679 Cerebrovascular disease, unspecified: Secondary | ICD-10-CM | POA: Diagnosis not present

## 2017-07-17 DIAGNOSIS — I5022 Chronic systolic (congestive) heart failure: Secondary | ICD-10-CM

## 2017-07-17 NOTE — Patient Instructions (Signed)
Medication Instructions:   STOP ISOSORBIDE  Testing/Procedures:  Your physician has requested that you have an echocardiogram. Echocardiography is a painless test that uses sound waves to create images of your heart. It provides your doctor with information about the size and shape of your heart and how well your heart's chambers and valves are working. This procedure takes approximately one hour. There are no restrictions for this procedure.   Your physician has requested that you have a carotid duplex. This test is an ultrasound of the carotid arteries in your neck. It looks at blood flow through these arteries that supply the brain with blood. Allow one hour for this exam. There are no restrictions or special instructions.    Follow-Up:  Your physician recommends that you schedule a follow-up appointment in: [place]   If you need a refill on your cardiac medications before your next appointment, please call your pharmacy.

## 2017-07-31 ENCOUNTER — Telehealth: Payer: Self-pay | Admitting: Cardiology

## 2017-07-31 NOTE — Telephone Encounter (Signed)
Pt of Dr. Stanford Breed Seen in office on 7/19  Returned call. Pt noted several concerns. She is set up for a return echocardiogram on 8/7 to follow up on her low EF of 25-30% seen in June.  Pt states she does not believe she will be able to make it for this echo.  States her husband is dying, in hospice, and she is staying with him as much as able.  D/t this she reports poor sleep quality, hasn't slept in 3 days, and therefore not using nighttime O2. She cites room air O2 sats of 94-98% when checked. No dyspnea noted. She is currently only taking her lasix at a dose of 40mg  DAILY instead of previously reported dose of 20mg  BID. Cites concern for incontinence, though she is aware she may take the PM dose in the afternoon instead of evening. States she takes the 40mg  at once "just to get it over with". She denies concern for fluid retention/weight gain at current dosing regimen.  Reports that she is drinking adequate fluids, but has very little appetite and is not eating.  I expressed my condolences for patient's situation. I let her know I would send msg to Dr. Stanford Breed for any advice & see when/if a rescheduled echocardiogram would be recommended.  She also has a planned carotid US on 8/14.

## 2017-07-31 NOTE — Telephone Encounter (Signed)
New message   Pt would not go into detail. I asked the reason for call - she states that she needs to know the answers to some questions. No further information was given.

## 2017-07-31 NOTE — Telephone Encounter (Signed)
I've spoken w patient and given recommendations. She's going to reschedule the echo for a more suitable date. After discussion she would like scheduler to reach out to her to give proposal of new testing dates. Pt aware I've sent a msg to scheduling to arrange.

## 2017-07-31 NOTE — Telephone Encounter (Signed)
Would obtain echo when able; continue lasix at 40 mg daily; fu as scheduled Kirk Ruths

## 2017-08-05 ENCOUNTER — Other Ambulatory Visit (HOSPITAL_COMMUNITY): Payer: Medicare Other

## 2017-08-12 ENCOUNTER — Ambulatory Visit (HOSPITAL_BASED_OUTPATIENT_CLINIC_OR_DEPARTMENT_OTHER): Payer: Medicare Other

## 2017-08-12 ENCOUNTER — Other Ambulatory Visit: Payer: Self-pay | Admitting: Cardiology

## 2017-08-12 ENCOUNTER — Ambulatory Visit (HOSPITAL_COMMUNITY)
Admission: RE | Admit: 2017-08-12 | Discharge: 2017-08-12 | Disposition: A | Discharge status: Home / Self Care | Payer: Medicare Other | Source: Ambulatory Visit | Attending: Cardiovascular Disease | Admitting: Cardiovascular Disease

## 2017-08-12 ENCOUNTER — Other Ambulatory Visit: Payer: Self-pay

## 2017-08-12 DIAGNOSIS — I5022 Chronic systolic (congestive) heart failure: Secondary | ICD-10-CM

## 2017-08-12 DIAGNOSIS — E1151 Type 2 diabetes mellitus with diabetic peripheral angiopathy without gangrene: Secondary | ICD-10-CM | POA: Diagnosis not present

## 2017-08-12 DIAGNOSIS — I6523 Occlusion and stenosis of bilateral carotid arteries: Secondary | ICD-10-CM | POA: Diagnosis not present

## 2017-08-12 DIAGNOSIS — I480 Paroxysmal atrial fibrillation: Secondary | ICD-10-CM | POA: Insufficient documentation

## 2017-08-12 DIAGNOSIS — I679 Cerebrovascular disease, unspecified: Secondary | ICD-10-CM

## 2017-08-12 DIAGNOSIS — E785 Hyperlipidemia, unspecified: Secondary | ICD-10-CM | POA: Insufficient documentation

## 2017-08-12 DIAGNOSIS — I739 Peripheral vascular disease, unspecified: Secondary | ICD-10-CM

## 2017-08-12 DIAGNOSIS — I059 Rheumatic mitral valve disease, unspecified: Secondary | ICD-10-CM | POA: Diagnosis not present

## 2017-08-12 DIAGNOSIS — I110 Hypertensive heart disease with heart failure: Secondary | ICD-10-CM | POA: Insufficient documentation

## 2017-08-12 DIAGNOSIS — I779 Disorder of arteries and arterioles, unspecified: Secondary | ICD-10-CM | POA: Insufficient documentation

## 2017-08-12 DIAGNOSIS — Z720 Tobacco use: Secondary | ICD-10-CM | POA: Diagnosis not present

## 2017-09-08 ENCOUNTER — Telehealth: Payer: Self-pay | Admitting: Cardiology

## 2017-09-08 MED ORDER — FUROSEMIDE 40 MG PO TABS: 40.0000 mg | ORAL_TABLET | Freq: Every day | ORAL | 0 refills | Status: DC

## 2017-09-08 MED ORDER — FUROSEMIDE 40 MG PO TABS: 40.0000 mg | ORAL_TABLET | Freq: Every day | ORAL | 0 refills | Status: DC | PRN

## 2017-09-08 NOTE — Telephone Encounter (Signed)
Can take lasix 40 mg daily as needed; contact if she develops increased edema; needs to check O2 sat on room air before [place] O2 (needs to be above 92). Kirk Ruths

## 2017-09-08 NOTE — Telephone Encounter (Signed)
Spoke with pt, Aware of dr [name] recommendations. She will wait until her follow up appt for the oxygen testing.

## 2017-09-08 NOTE — Telephone Encounter (Signed)
S/w pt she states that she is doing well since July her EF is up to 55% and O2sat has been running 95-98 twice daily since July. She states that she has not had any swelling or SOB since July but has been taking lasix 40mg  daily. She states the her weight is up 5 lbs, but she is eating better she is tracking her weight daily.   Can she send back her oxygen condenser and stop her lasix and only take lasix as needed for swelling? Please advise

## 2017-09-08 NOTE — Telephone Encounter (Signed)
New message    Pt wants to ask about her oxygen and lasix. Please call pt.

## 2017-10-16 NOTE — Progress Notes (Signed)
HPI: FU atrial fibrillation. She has a history of paroxysmal atrial fibrillation. Abdominal ultrasound May 2016 showed no aneurysm. ABIs November 2016 normal. Patient admitted with non-ST elevation myocardial infarction in June 2018. Cardiac catheterization revealed a 20% proximal to mid LAD and normal left ventricular end-diastolic pressure. Echocardiogram showed ejection fraction 99-24%, grade 2 diastolic dysfunction, mild tricuspid regurgitation, severely elevated pulmonary pressure. Sludge noted LV apex. 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 Echocardiogram repeated August 2018 and showed normal LV function, grade 1 diastolic dysfunction. Carotid Dopplers August 2018 showed 40-59% left and 1-39% right stenosis. Since last seen, she denies dyspnea, chest pain, palpitations, syncope or bleeding. She recently lost her husband who is appropriately upset.  Current Outpatient Prescriptions  Medication Sig Dispense Refill  . acetaminophen (TYLENOL) 650 MG CR tablet Take 1,300 mg by mouth every 8 (eight) hours as needed for pain.    . Cholecalciferol (VITAMIN D3) 1000 UNITS CAPS Take 1 tablet by mouth daily.    . furosemide (LASIX) 40 MG tablet Take 1 tablet (40 mg total) by mouth daily as needed. 30 tablet 0  . gabapentin (NEURONTIN) 300 MG capsule Take 300 mg by mouth 3 (three) times daily.     . insulin glargine (LANTUS) 100 UNIT/ML injection Inject 0.15 mLs (15 Units total) into the skin at bedtime. (Patient taking differently: Inject 30 Units into the skin at bedtime. ) 10 mL 11  . metoprolol succinate (TOPROL-XL) 50 MG 24 hr tablet Take 1 tablet (50 mg total) by mouth daily. Take with or immediately following a meal. 30 tablet 1  . Multiple Vitamins-Minerals (CENTRUM SILVER PO) Take 1 tablet by mouth daily.    . potassium chloride (K-DUR) 10 MEQ tablet Take 1 tablet (10 mEq total) by mouth daily.    . rivaroxaban (XARELTO) 20 MG TABS tablet Take 20 mg by mouth daily with supper.      . rosuvastatin (CRESTOR) 20 MG tablet Take 20 mg by mouth every other day.     . sertraline (ZOLOFT) 50 MG tablet Take 50 mg by mouth daily.     . vitamin B-12 (CYANOCOBALAMIN) 1000 MCG tablet Take 1,000 mcg by mouth daily.     No current facility-administered medications for this visit.      Past Medical History:  Diagnosis Date  . Anemia    microcytic   . Arthritis   . Atrial fibrillation ([place])   . Cataract    REMOVED BILATERAL  . Colon polyps   . Diabetes mellitus ([place])   . Fibromyalgia   . Gastroparesis   . Hyperlipidemia   . Hypertension   . Kidney stones   . Melanoma ([place])   . PMR (polymyalgia rheumatica) ([facility])   . PVD (peripheral vascular disease) ([place])     Past Surgical History:  Procedure Laterality Date  . APPENDECTOMY    . BREAST BIOPSY    . CESAREAN SECTION    . COLONOSCOPY    . COLONOSCOPY WITH PROPOFOL N/A 12/26/2015   Procedure: COLONOSCOPY WITH PROPOFOL;  Surgeon: Manus Gunning, MD;  Location: [facility];  Service: Gastroenterology;  Laterality: N/A;  . ESOPHAGOGASTRODUODENOSCOPY (EGD) WITH PROPOFOL N/A 12/26/2015   Procedure: ESOPHAGOGASTRODUODENOSCOPY (EGD) WITH PROPOFOL;  Surgeon: Manus Gunning, MD;  Location: [facility];  Service: Gastroenterology;  Laterality: N/A;  . LEFT HEART CATH AND CORONARY ANGIOGRAPHY N/A 06/03/2017   Procedure: Left Heart Cath and Coronary Angiography;  Surgeon: Martinique, Peter M, MD;  Location: [facility];  Service: Cardiovascular;  Laterality: N/A;  . POLYPECTOMY      Social History   Social History  . Marital status: Widowed    Spouse name: N/A  . Number of children: 1  . Years of education: N/A   Occupational History  . Not on file.   Social History Main Topics  . Smoking status: Current Every Day Smoker    Packs/day: 1.00    Types: Cigarettes  . Smokeless tobacco: Never Used  . Alcohol use No  . Drug use: No  . Sexual activity: Not on file   Other Topics Concern  . Not on  file   Social History Narrative  . No narrative on file    Family History  Problem Relation Age of Onset  . CAD Father        MI and CABG  . Diabetes Father   . Heart disease Father   . Colon cancer Mother 6    ROS: back pain but no fevers or chills, productive cough, hemoptysis, dysphasia, odynophagia, melena, hematochezia, dysuria, hematuria, rash, seizure activity, orthopnea, PND, pedal edema, claudication. Remaining systems are negative.  Physical Exam: Well-developed well-nourished in no acute distress.  Skin is warm and dry.  HEENT is normal.  Neck is supple.  Chest is clear to auscultation with normal expansion.  Cardiovascular exam is regular rate and rhythm.  Abdominal exam nontender or distended. No masses palpated. Extremities show no edema. neuro grossly intact  A/P  1 paroxysmal atrial fibrillation-patient remains in sinus rhythm today on examination. Continue anticoagulation with xarelto. Continue Toprol for rate control if atrial fibrillation recurs. Check Hgb and renal function.  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  3 carotid artery disease-she will need follow-up carotid Dopplers August 2019.  4 hypertension-blood pressure is controlled. Continue present medications.  5 hyperlipidemia-continue statin.  6 tobacco abuse-Needs to DC.  7 peripheral vascular disease-continue statin. Not on aspirin given need for anticoagulation.  Kirk Ruths, MD

## 2017-10-21 ENCOUNTER — Ambulatory Visit (INDEPENDENT_AMBULATORY_CARE_PROVIDER_SITE_OTHER): Payer: Medicare Other | Admitting: Cardiology

## 2017-10-21 ENCOUNTER — Encounter: Payer: Self-pay | Admitting: Cardiology

## 2017-10-21 VITALS — BP 140/68 | HR 64 | Ht 63.0 in | Wt 160.0 lb

## 2017-10-21 DIAGNOSIS — E7800 Pure hypercholesterolemia, unspecified: Secondary | ICD-10-CM

## 2017-10-21 DIAGNOSIS — I6523 Occlusion and stenosis of bilateral carotid arteries: Secondary | ICD-10-CM

## 2017-10-21 DIAGNOSIS — I10 Essential (primary) hypertension: Secondary | ICD-10-CM | POA: Diagnosis not present

## 2017-10-21 DIAGNOSIS — I480 Paroxysmal atrial fibrillation: Secondary | ICD-10-CM

## 2017-10-21 MED ORDER — METOPROLOL SUCCINATE ER 50 MG PO TB24: 50.0000 mg | ORAL_TABLET | Freq: Every day | ORAL | 3 refills | Status: DC

## 2017-10-21 MED ORDER — FUROSEMIDE 40 MG PO TABS: 40.0000 mg | ORAL_TABLET | Freq: Every day | ORAL | 3 refills | Status: DC | PRN

## 2017-10-21 MED ORDER — ROSUVASTATIN CALCIUM 20 MG PO TABS: 20.0000 mg | ORAL_TABLET | ORAL | 3 refills | Status: DC

## 2017-10-21 MED ORDER — RIVAROXABAN 20 MG PO TABS: 20.0000 mg | ORAL_TABLET | Freq: Every day | ORAL | 1 refills | Status: DC

## 2017-10-21 MED ORDER — POTASSIUM CHLORIDE ER 10 MEQ PO TBCR: 10.0000 meq | EXTENDED_RELEASE_TABLET | Freq: Every day | ORAL | 3 refills | Status: DC

## 2017-10-21 NOTE — Patient Instructions (Signed)
Medication Instructions:   Refill sent to the pharmacy electronically.   Labwork:  Your physician recommends that you return for lab work WHEN CONVENIENT   Follow-Up:  Your physician wants you to follow-up in: [place] will receive a reminder letter in the mail two months in advance. If you don't receive a letter, please call our office to schedule the follow-up appointment.   If you need a refill on your cardiac medications before your next appointment, please call your pharmacy.

## 2017-10-22 ENCOUNTER — Telehealth: Payer: Self-pay | Admitting: Cardiology

## 2017-10-22 NOTE — Telephone Encounter (Signed)
Spoke with pt, aware she can only take tylenol. Per dr Stanford Breed, patient can stop oxygen. Will make [facility] aware.

## 2017-10-22 NOTE — Telephone Encounter (Signed)
New Message  Pt call requesting to speak with RN about getting an order to discontinue oxygen. Pt states the company needs and order to stop providing to pt.     Pt c/o medication issue:  1. Name of Medication: tylenol or Aleve  2. How are you currently taking this medication (dosage and times per day)? Once daily   3. Are you having a reaction (difficulty breathing--STAT)? No   4. What is your medication issue? Per pt would like to know if it would be okay to take one of these medication for pain please call back to discuss

## 2017-10-23 ENCOUNTER — Telehealth: Payer: Self-pay | Admitting: Cardiology

## 2017-10-23 DIAGNOSIS — I428 Other cardiomyopathies: Secondary | ICD-10-CM

## 2017-10-23 NOTE — Telephone Encounter (Signed)
Order placed to discontinue oxygen. [facility] made aware.

## 2017-10-23 NOTE — Telephone Encounter (Signed)
Spoke with pt, aware I am waiting for [facility] to respond about how to put the order to dc oxygen in EPIC.

## 2017-10-23 NOTE — Telephone Encounter (Signed)
°  New Prob  States fax was not received by [place] to discontinue Oxygen. Please call.

## 2017-12-02 ENCOUNTER — Telehealth: Payer: Self-pay | Admitting: Cardiology

## 2017-12-02 NOTE — Telephone Encounter (Signed)
Patient calling, states that she is just not feeling well. Patient has no energy, not desire to do anything. Patient states that she needs some help from somewhere and that she needs some help.

## 2017-12-02 NOTE — Telephone Encounter (Signed)
[name]  

## 2017-12-02 NOTE — Telephone Encounter (Signed)
Returned call to patient of Dr. Stanford Breed. She does not know whether she needs a "doctor or a psychiatrist". She has no energy. She has symptoms for a few months but are worsening. Labs (CBC, BMET) were ordered in October - have not been done yet. She states she has not felt like coming back in for labs. She reports she is not eating right and "not getting the vegetables that she should". She states she is not going to cook for just her. Her spouse passed away about 4 months ago.   Patient stopped vitamin B12 and gabapentin 300mg  TID b/c "it wasn't doing anything anyway" - med list updated.   Advised it would be wise for her to get blood work done. Routed to MD for any other recommendations.. Any additional labs?

## 2017-12-02 NOTE — Telephone Encounter (Signed)
Patient called & scheduled for PAOV 12/03/17 @ 1130 with Lurena Joiner, [place]

## 2017-12-03 ENCOUNTER — Ambulatory Visit (INDEPENDENT_AMBULATORY_CARE_PROVIDER_SITE_OTHER): Payer: Medicare Other | Admitting: Cardiology

## 2017-12-03 ENCOUNTER — Encounter: Payer: Self-pay | Admitting: Cardiology

## 2017-12-03 VITALS — BP 142/78 | HR 59 | Ht 63.0 in | Wt 162.6 lb

## 2017-12-03 DIAGNOSIS — I6523 Occlusion and stenosis of bilateral carotid arteries: Secondary | ICD-10-CM | POA: Diagnosis not present

## 2017-12-03 DIAGNOSIS — Z794 Long term (current) use of insulin: Secondary | ICD-10-CM

## 2017-12-03 DIAGNOSIS — Z7901 Long term (current) use of anticoagulants: Secondary | ICD-10-CM

## 2017-12-03 DIAGNOSIS — I214 Non-ST elevation (NSTEMI) myocardial infarction: Secondary | ICD-10-CM

## 2017-12-03 DIAGNOSIS — Z720 Tobacco use: Secondary | ICD-10-CM

## 2017-12-03 DIAGNOSIS — Z0389 Encounter for observation for other suspected diseases and conditions ruled out: Secondary | ICD-10-CM

## 2017-12-03 DIAGNOSIS — I480 Paroxysmal atrial fibrillation: Secondary | ICD-10-CM

## 2017-12-03 DIAGNOSIS — I10 Essential (primary) hypertension: Secondary | ICD-10-CM | POA: Diagnosis not present

## 2017-12-03 DIAGNOSIS — F329 Major depressive disorder, single episode, unspecified: Secondary | ICD-10-CM | POA: Diagnosis not present

## 2017-12-03 DIAGNOSIS — I5181 Takotsubo syndrome: Secondary | ICD-10-CM | POA: Diagnosis not present

## 2017-12-03 DIAGNOSIS — E119 Type 2 diabetes mellitus without complications: Secondary | ICD-10-CM | POA: Diagnosis not present

## 2017-12-03 DIAGNOSIS — F32A Depression, unspecified: Secondary | ICD-10-CM | POA: Insufficient documentation

## 2017-12-03 DIAGNOSIS — IMO0001 Reserved for inherently not codable concepts without codable children: Secondary | ICD-10-CM

## 2017-12-03 DIAGNOSIS — M353 Polymyalgia rheumatica: Secondary | ICD-10-CM

## 2017-12-03 NOTE — Assessment & Plan Note (Signed)
Followed by Dr Lenna Gilford- the pt does not tolerate steroids well secondary to her DM

## 2017-12-03 NOTE — Assessment & Plan Note (Signed)
June 2018- EF 25% with grade 2 DD- improved to 55-60% with grade 1 DD Aug 2018

## 2017-12-03 NOTE — Patient Instructions (Addendum)
Continue same medications    Cbc,bmet,tsh today    Your physician recommends that you schedule a follow-up appointment with Dr.Crenshaw Friday 03/27/18 at 1:20 pm

## 2017-12-03 NOTE — Progress Notes (Signed)
12/03/2017 Kathleen Griffith   [birth year]  [id number]  Primary Physician Bernerd Limbo, MD Primary Cardiologist: Dr Stanford Breed  HPI:  76 y/o female with a history of PAF, on Xarelto, HTN, PMR, IDDM, and moderate carotid disease. ABIs done November 2016 were normal. The patient was admitted with a NSTEMI in June 2018. Cardiac catheterization revealed a 20% proximal to mid LAD otherwise normal coronaries. Echocardiogram showed ejection fraction 78-29%, grade 2 diastolic dysfunction, mild tricuspid regurgitation, severely elevated pulmonary pressure. Sludge noted LV apex. Findings were suggestive for Takotsubo CM. 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 Echocardiogram was repeated in August 2018 and showed normal LV function, grade 1 diastolic dysfunction. Carotid Dopplers August 2018 showed 40-59% left and 1-39% right stenosis. She last saw Dr Stanford Breed 10/21/17.   She called the office yesterday with multiple complaints, mainly what sounds like depression (her husband passed 4 months ago). She was added on to my schedule today. The pt complains of generalized weakness. She admits to being "sad" all the time. She is not eating well and smoking a pack a day.  She has a lot of baseline pain secondary to DJD and PMR. 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 In discussing her medications she admits she thinks she felt better on Metoprolol tartrate 25 mg BID thasn she does on Toprol 50 mg daily. I have seen this in other patients.      Current Outpatient Medications  Medication Sig Dispense Refill  . acetaminophen (TYLENOL) 650 MG CR tablet Take 1,300 mg by mouth every 8 (eight) hours as needed for pain.    . Cholecalciferol (VITAMIN D3) 1000 UNITS CAPS Take 1 tablet by mouth daily.    . insulin glargine (LANTUS) 100 UNIT/ML injection Inject 0.15 mLs (15 Units total) into the skin at bedtime. (Patient taking  differently: Inject 30 Units into the skin at bedtime. ) 10 mL 11  . metoprolol succinate (TOPROL-XL) 50 MG 24 hr tablet Take 1 tablet (50 mg total) by mouth daily. Take with or immediately following a meal. 90 tablet 3  . Multiple Vitamins-Minerals (CENTRUM SILVER PO) Take 1 tablet by mouth daily.    . rivaroxaban (XARELTO) 20 MG TABS tablet Take 1 tablet (20 mg total) by mouth daily with supper. 90 tablet 1  . rosuvastatin (CRESTOR) 20 MG tablet Take 1 tablet (20 mg total) by mouth every other day. 45 tablet 3  . sertraline (ZOLOFT) 50 MG tablet Take 50 mg by mouth daily.      No current facility-administered medications for this visit.     Allergies  Allergen Reactions  . Losartan Shortness Of Breath    wheezing wheezing  . Pregabalin Swelling    TO LOWER EXTREMTIES TO LOWER EXTREMTIES  . Metoclopramide Other (See Comments)    Tardive dyskinesia  . Lisinopril     cough Other reaction(s): Cough cough  . Metformin Diarrhea  . Metformin And Related Diarrhea    Past Medical History:  Diagnosis Date  . Anemia    microcytic   . Arthritis   . Atrial fibrillation ([place])   . Cataract    REMOVED BILATERAL  . Colon polyps   . Diabetes mellitus ([place])   . Fibromyalgia   . Gastroparesis   . Hyperlipidemia   . Hypertension   . Kidney stones   . Melanoma ([place])   . PMR (polymyalgia rheumatica) ([facility])   .  PVD (peripheral vascular disease) ([place])     Social History   Socioeconomic History  . Marital status: Widowed    Spouse name: Not on file  . Number of children: 1  . Years of education: Not on file  . Highest education level: Not on file  Social Needs  . Financial resource strain: Not on file  . Food insecurity - worry: Not on file  . Food insecurity - inability: Not on file  . Transportation needs - medical: Not on file  . Transportation needs - non-medical: Not on file  Occupational History  . Not on file  Tobacco Use  . Smoking status: Current Every Day Smoker     Packs/day: 1.00    Types: Cigarettes  . Smokeless tobacco: Never Used  Substance and Sexual Activity  . Alcohol use: No    Alcohol/week: 0.0 oz  . Drug use: No  . Sexual activity: Not on file  Other Topics Concern  . Not on file  Social History Narrative  . Not on file     Family History  Problem Relation Age of Onset  . CAD Father        MI and CABG  . Diabetes Father   . Heart disease Father   . Colon cancer Mother 4     Review of Systems: General: negative for chills, fever, night sweats or weight changes.  Cardiovascular: negative for chest pain, dyspnea on exertion, edema, orthopnea, palpitations, paroxysmal nocturnal dyspnea or shortness of breath Dermatological: negative for rash Respiratory: negative for cough or wheezing Urologic: negative for hematuria Abdominal: negative for nausea, vomiting, diarrhea, bright red blood per rectum, melena, or hematemesis Neurologic: negative for visual changes, syncope, or dizziness All other systems reviewed and are otherwise negative except as noted above.    Blood pressure (!) 142/78, pulse (!) 59, height 5\' 3"  (1.6 m), weight 162 lb 9.6 oz (73.8 kg).  General appearance: alert, cooperative, no distress and depressed Neck: no JVD and bilateral carotid bruits Lungs: clear to auscultation bilaterally Heart: regular rate and rhythm Extremities: extremities normal, atraumatic, no cyanosis or edema Skin: Skin color, texture, turgor normal. No rashes or lesions Neurologic: Grossly normal   ASSESSMENT AND PLAN:   Depression Pt has been depressed since she lost her husband  PAF (paroxysmal atrial fibrillation) (HCC) NSR today  Normal coronary arteries June 2018  Takotsubo syndrome June 2018- EF 25% with grade 2 DD- improved to 55-60% with grade 1 DD Aug 2018  Tobacco abuse 1 PPD  Insulin dependent diabetes mellitus ([facility]) Lantus 15 u Q HS  Carotid artery disease ([facility]) RICA 4-16%, LICA 60-63% by doppler Aug  2018  Polymyalgia rheumatica ([place]) Followed by Dr Lenna Gilford- the pt does not tolerate steroids well secondary to her DM   PLAN  I spent 30 minutes discussing [name]'s various complaints and concerns. 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 She is on Zoloft and has an appointment with her PCP Monday. I suggested she discuss this further with him. I encouraged her to cut back on her smoking -" I can't do that right now". I encouraged to speak with her pastor and explore looking into grief counseling.   Dr Stanford Breed had wanted labs LOV which she never had done, I'll re order those today-CBC, BMP, TSH. I suggested she change her Toprol back to Metoprolol 25 mg BID.   Kerin Ransom PA-C 12/03/2017 1:33 PM

## 2017-12-03 NOTE — Assessment & Plan Note (Signed)
Pt has been depressed since she lost her husband

## 2017-12-03 NOTE — Assessment & Plan Note (Signed)
June 2018

## 2017-12-03 NOTE — Assessment & Plan Note (Signed)
1 PPD 

## 2017-12-03 NOTE — Assessment & Plan Note (Signed)
RICA 5-79%, LICA 72-82% by doppler Aug 2018

## 2017-12-03 NOTE — Assessment & Plan Note (Signed)
NSR today 

## 2017-12-03 NOTE — Assessment & Plan Note (Signed)
Lantus 15 u Q HS

## 2017-12-03 NOTE — Progress Notes (Signed)
cbc

## 2017-12-04 LAB — CBC WITH DIFFERENTIAL/PLATELET
Basophils Absolute: 0.1 10*3/uL (ref 0.0–0.2)
Basos: 1 %
EOS (ABSOLUTE): 0.2 10*3/uL (ref 0.0–0.4)
Eos: 3 %
Hematocrit: 47.5 % — ABNORMAL HIGH (ref 34.0–46.6)
Hemoglobin: 15.8 g/dL (ref 11.1–15.9)
Immature Grans (Abs): 0 10*3/uL (ref 0.0–0.1)
Immature Granulocytes: 0 %
Lymphocytes Absolute: 1.9 10*3/uL (ref 0.7–3.1)
Lymphs: 22 %
MCH: 31.4 pg (ref 26.6–33.0)
MCHC: 33.3 g/dL (ref 31.5–35.7)
MCV: 94 fL (ref 79–97)
Monocytes Absolute: 0.5 10*3/uL (ref 0.1–0.9)
Monocytes: 6 %
Neutrophils Absolute: 6.1 10*3/uL (ref 1.4–7.0)
Neutrophils: 68 %
Platelets: 302 10*3/uL (ref 150–379)
RBC: 5.03 x10E6/uL (ref 3.77–5.28)
RDW: 15.3 % (ref 12.3–15.4)
WBC: 8.9 10*3/uL (ref 3.4–10.8)

## 2017-12-04 LAB — BASIC METABOLIC PANEL
BUN/Creatinine Ratio: 29 — ABNORMAL HIGH (ref 12–28)
BUN: 23 mg/dL (ref 8–27)
CO2: 28 mmol/L (ref 20–29)
Calcium: 9.4 mg/dL (ref 8.7–10.3)
Chloride: 100 mmol/L (ref 96–106)
Creatinine, Ser: 0.79 mg/dL (ref 0.57–1.00)
GFR calc Af Amer: 84 mL/min/{1.73_m2} (ref >59)
GFR calc non Af Amer: 73 mL/min/{1.73_m2} (ref >59)
Glucose: 160 mg/dL — ABNORMAL HIGH (ref 65–99)
Potassium: 4.5 mmol/L (ref 3.5–5.2)
Sodium: 142 mmol/L (ref 134–144)

## 2017-12-04 LAB — TSH: TSH: 1.54 u[IU]/mL (ref 0.450–4.500)

## 2017-12-31 ENCOUNTER — Encounter: Payer: Self-pay | Admitting: Physical Therapy

## 2017-12-31 ENCOUNTER — Ambulatory Visit: Payer: Medicare Other | Attending: Orthopedic Surgery | Admitting: Physical Therapy

## 2017-12-31 DIAGNOSIS — M25552 Pain in left hip: Secondary | ICD-10-CM | POA: Insufficient documentation

## 2017-12-31 DIAGNOSIS — R262 Difficulty in walking, not elsewhere classified: Secondary | ICD-10-CM

## 2017-12-31 DIAGNOSIS — M6281 Muscle weakness (generalized): Secondary | ICD-10-CM | POA: Insufficient documentation

## 2017-12-31 NOTE — Therapy (Signed)
[address] [place], [place], [address] Phone: [phone]   Fax:  [phone]  Physical Therapy Evaluation  Patient Details  Name: Kathleen Griffith MRN: [id number] Date of Birth: Feb 12, 1941 No Data Recorded  Encounter Date: 12/31/2017  PT End of Session - 12/31/17 1627    Visit Number  1    Date for PT Re-Evaluation  02/28/18    PT Start Time  1515    PT Stop Time  1610    PT Time Calculation (min)  55 min    Activity Tolerance  Patient tolerated treatment well    Behavior During Therapy  [facility] for tasks assessed/performed       Past Medical History:  Diagnosis Date  . Anemia    microcytic   . Arthritis   . Atrial fibrillation ([place])   . Cataract    REMOVED BILATERAL  . Colon polyps   . Diabetes mellitus ([place])   . Fibromyalgia   . Gastroparesis   . Hyperlipidemia   . Hypertension   . Kidney stones   . Melanoma ([place])   . PMR (polymyalgia rheumatica) (HCC)   . PVD (peripheral vascular disease) ([place])     Past Surgical History:  Procedure Laterality Date  . APPENDECTOMY    . BREAST BIOPSY    . CESAREAN SECTION    . COLONOSCOPY    . COLONOSCOPY WITH PROPOFOL N/A 12/26/2015   Procedure: COLONOSCOPY WITH PROPOFOL;  Surgeon: Manus Gunning, MD;  [facility];  Service: Gastroenterology;  Laterality: N/A;  . ESOPHAGOGASTRODUODENOSCOPY (EGD) WITH PROPOFOL N/A 12/26/2015   Procedure: ESOPHAGOGASTRODUODENOSCOPY (EGD) WITH PROPOFOL;  Surgeon: Manus Gunning, MD;  [facility];  Service: Gastroenterology;  Laterality: N/A;  . LEFT HEART CATH AND CORONARY ANGIOGRAPHY N/A 06/03/2017   Procedure: Left Heart Cath and Coronary Angiography;  Surgeon: Martinique, Peter M, MD;  Location: [facility];  Service: Cardiovascular;  Laterality: N/A;  . POLYPECTOMY      There were no vitals filed for this visit.   Subjective Assessment - 12/31/17 1526    Subjective  Patient  reports left hip pain has diagnosis of trochanteric bursitis and piriformis syndrome.  She reports that this started in October.  She has had an injection that did not help.      Limitations  Standing;Walking    Patient Stated Goals  have less pain, walk better    Currently in Pain?  Yes    Pain Score  6     Pain Location  Hip    Pain Orientation  Left    Pain Descriptors / Indicators  Aching;Sore    Pain Type  Acute pain    Pain Onset  More than a month ago    Pain Frequency  Constant    Aggravating Factors   walking, sitting on hard chairs pain up to 9/10    Pain Relieving Factors  rest, CBD oil helps at best pain 3/10    Effect of Pain on Daily Activities  difficulty walking, standing and sitting         [facility] PT Assessment - 12/31/17 0001      Assessment   Medical Diagnosis  left hip pain    Onset Date/Surgical Date  11/30/17    Prior Therapy  for back about 2 years ago      Precautions   Precautions  None      Balance Screen   Has the patient  fallen in the past 6 months  No    Has the patient had a decrease in activity level because of a fear of falling?   No    Is the patient reluctant to leave their home because of a fear of falling?   No      Home Environment   Additional Comments  3 steps, does housework      Prior Function   Level of Independence  Independent    Vocation  Retired    Leisure  no exercise      Posture/Postural Control   Posture Comments  fwd head, rounded shoulders      ROM / Strength   AROM / PROM / Strength  AROM;Strength      AROM   Overall AROM Comments  lumbar flexion decreased 50%, extension decresed 100% with pain, Hip ROM is limited in ER to 20 degrees      Strength   Overall Strength Comments  3+/5 for the hips with pain in the left hip      Flexibility   Soft Tissue Assessment /Muscle Length  yes    Hamstrings  very tight    ITB  very tight and painful    Piriformis  very tight with pain in the left      Palpation    Palpation comment  very tender in the left buttock the left GT area nd the left ITB area      Transfers   Comments  requires arms to stand      Ambulation/Gait   Gait Comments  antalgic gait on the left             Objective measurements completed on examination: See above findings.      [facility] Adult PT Treatment/Exercise - 12/31/17 0001      Modalities   Modalities  Electrical Stimulation;Moist Heat      Moist Heat Therapy   Number Minutes Moist Heat  15 Minutes    Moist Heat Location  Hip      Electrical Stimulation   Electrical Stimulation Location  left GT/buttock area    Electrical Stimulation Action  IFC    Electrical Stimulation Parameters  sitting    Electrical Stimulation Goals  Pain             PT Education - 12/31/17 1626    Education provided  Yes    Education Details  HS and piriformis stretches    Person(s) Educated  Patient    Methods  Explanation;Handout;Demonstration    Comprehension  Verbalized understanding       PT Short Term Goals - 12/31/17 1640      PT SHORT TERM GOAL #1   Title  independent with initial HEP    Time  2    Period  Weeks    Status  New        PT Long Term Goals - 12/31/17 1641      PT LONG TERM GOAL #1   Title  understand proper posture and body mechanics    Time  8    Period  Weeks    Status  New      PT LONG TERM GOAL #2   Title  decrease pain 50%    Time  8    Period  Weeks    Status  New      PT LONG TERM GOAL #3   Title  walk 300 feet without rest    Time  8    Period  Weeks    Status  New      PT LONG TERM GOAL #4   Title  tolerate standing 10 minutes    Time  8    Period  Weeks    Status  New      PT LONG TERM GOAL #5   Title  vacuum without increase of pain    Time  8    Period  Weeks    Status  New             Plan - 12/31/17 1628    Clinical Impression Statement  Patient with left GT area and hip pain.  She reports that it started in October, she is unsure of a cause,  she had been caring for her husband until he passed away last year.  X-rays show arthritis but overall negative.  Her lumbar ROM is limited, her ER of the hip is minimal, strength is minimal.  Has a mild antalgic gait on the left    Clinical Presentation  Stable    Clinical Decision Making  Low    Rehab Potential  Good    PT Frequency  2x / week    PT Duration  8 weeks    PT Treatment/Interventions  ADLs/Self Care Home Management;Cryotherapy;Electrical Stimulation;Iontophoresis 4mg /ml Dexamethasone;Moist Heat;Ultrasound;Gait training;Balance training;Therapeutic exercise;Therapeutic activities;Functional mobility training;Patient/family education;Manual techniques    PT Next Visit Plan  Slowly add exercises and treat pain as needed    Consulted and Agree with Plan of Care  Patient       Patient will benefit from skilled therapeutic intervention in order to improve the following deficits and impairments:  Abnormal gait, Decreased activity tolerance, Decreased balance, Decreased mobility, Decreased strength, Postural dysfunction, Improper body mechanics, Impaired flexibility, Pain, Increased muscle spasms, Decreased range of motion, Difficulty walking  Visit Diagnosis: Pain in left hip - Plan: PT plan of care cert/re-cert  Difficulty in walking, not elsewhere classified - Plan: PT plan of care cert/re-cert  Muscle weakness (generalized) - Plan: PT plan of care cert/re-cert  G-Codes - 75/64/33 1642    Functional Assessment Tool Used (Outpatient Only)  foto 73% limitation        Problem List Patient Active Problem List   Diagnosis Date Noted  . Depression 12/03/2017  . Normal coronary arteries 12/03/2017  . 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 Polymyalgia rheumatica ([place]) 12/03/2017  . NSTEMI (non-ST elevated myocardial infarction) ([place]) 06/02/2017  . Fracture of rib of right side 03/20/2016  . Acute respiratory failure with hypoxia ([place]) 03/20/2016  . PAF (paroxysmal atrial  fibrillation) ([place]) 03/20/2016  . Pneumothorax on right 03/20/2016  . ARDS (adult respiratory distress syndrome) ([place]) 03/20/2016  . Insulin dependent diabetes mellitus ([place]) 03/20/2016  . GERD (gastroesophageal reflux disease) 03/20/2016  . Dysphagia   . Abdominal pain, epigastric   . Gastric polyp   . Anemia, iron deficiency   . Angiodysplasia of cecum   . Colon polyp   . 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 Chronic anticoagulation 07/28/2015  . Family history of colon cancer 07/28/2015  . CN (constipation) 07/28/2015  . Paroxysmal atrial fibrillation ([place]) 04/04/2015  . Carotid artery disease ([place]) 04/04/2015  . Tobacco abuse 04/04/2015  . Essential hypertension 04/04/2015  . Hyperlipidemia 04/04/2015    Sumner Boast., PT 12/31/2017, 4:48 PM  [address] [place], [place], [address] Phone: [phone]   Fax:  [phone]  Name: Kathleen Griffith  MRN: [id number] Date of Birth: Nov 28, 1941

## 2018-01-05 ENCOUNTER — Ambulatory Visit: Payer: Medicare Other | Admitting: Physical Therapy

## 2018-01-05 ENCOUNTER — Encounter: Payer: Self-pay | Admitting: Physical Therapy

## 2018-01-05 DIAGNOSIS — M25552 Pain in left hip: Secondary | ICD-10-CM | POA: Diagnosis not present

## 2018-01-05 DIAGNOSIS — M6281 Muscle weakness (generalized): Secondary | ICD-10-CM

## 2018-01-05 DIAGNOSIS — R262 Difficulty in walking, not elsewhere classified: Secondary | ICD-10-CM

## 2018-01-05 NOTE — Therapy (Signed)
[address] [place], [place], [address] Phone: [phone]   Fax:  [phone]  Physical Therapy Treatment  Patient Details  Name: Kathleen Griffith MRN: [id number] Date of Birth: 22-Feb-1941 No Data Recorded  Encounter Date: 01/05/2018  PT End of Session - 01/05/18 1355    Visit Number  2    Date for PT Re-Evaluation  02/28/18    PT Start Time  1308    PT Stop Time  1407    PT Time Calculation (min)  59 min    Activity Tolerance  Patient tolerated treatment well    Behavior During Therapy  [facility] for tasks assessed/performed       Past Medical History:  Diagnosis Date  . Anemia    microcytic   . Arthritis   . Atrial fibrillation ([place])   . Cataract    REMOVED BILATERAL  . Colon polyps   . Diabetes mellitus ([place])   . Fibromyalgia   . Gastroparesis   . Hyperlipidemia   . Hypertension   . Kidney stones   . Melanoma ([place])   . PMR (polymyalgia rheumatica) (HCC)   . PVD (peripheral vascular disease) ([place])     Past Surgical History:  Procedure Laterality Date  . APPENDECTOMY    . BREAST BIOPSY    . CESAREAN SECTION    . COLONOSCOPY    . COLONOSCOPY WITH PROPOFOL N/A 12/26/2015   Procedure: COLONOSCOPY WITH PROPOFOL;  Surgeon: Manus Gunning, MD;  [facility];  Service: Gastroenterology;  Laterality: N/A;  . ESOPHAGOGASTRODUODENOSCOPY (EGD) WITH PROPOFOL N/A 12/26/2015   Procedure: ESOPHAGOGASTRODUODENOSCOPY (EGD) WITH PROPOFOL;  Surgeon: Manus Gunning, MD;  [facility];  Service: Gastroenterology;  Laterality: N/A;  . LEFT HEART CATH AND CORONARY ANGIOGRAPHY N/A 06/03/2017   Procedure: Left Heart Cath and Coronary Angiography;  Surgeon: Martinique, Peter M, MD;  Location: [facility];  Service: Cardiovascular;  Laterality: N/A;  . POLYPECTOMY      There were no vitals filed for this visit.  Subjective Assessment - 01/05/18 1319    Subjective  Patient feels  that the last treatment helped, she reports that she tried the exercies.  She reports that her pain is a little bit higher on the left hip posterior    Currently in Pain?  Yes    Pain Score  3     Pain Location  Hip    Pain Orientation  Left;Posterior    Pain Descriptors / Indicators  Sore                      [facility] Adult PT Treatment/Exercise - 01/05/18 0001      Exercises   Exercises  Knee/Hip      Knee/Hip Exercises: Stretches   Passive Hamstring Stretch  3 reps;20 seconds    Piriformis Stretch  3 reps;30 seconds      Knee/Hip Exercises: Aerobic   Nustep  level 4 x 5 minutes      Knee/Hip Exercises: Seated   Ball Squeeze  20 reps    Other Seated Knee/Hip Exercises  on sit fit for pelvic mobility and stability      Knee/Hip Exercises: Supine   Bridges with Ball Squeeze  20 reps    Other Supine Knee/Hip Exercises  feet on ball K2C, trunk rotaiton, small bridges and abdominal isometrics      Moist Heat Therapy   Number Minutes Moist Heat  15  Minutes    Moist Heat Location  Hip      Electrical Stimulation   Electrical Stimulation Location  left GT/buttock area    Electrical Stimulation Action  IFC    Electrical Stimulation Parameters  supine    Electrical Stimulation Goals  Pain               PT Short Term Goals - 12/31/17 1640      PT SHORT TERM GOAL #1   Title  independent with initial HEP    Time  2    Period  Weeks    Status  New        PT Long Term Goals - 12/31/17 1641      PT LONG TERM GOAL #1   Title  understand proper posture and body mechanics    Time  8    Period  Weeks    Status  New      PT LONG TERM GOAL #2   Title  decrease pain 50%    Time  8    Period  Weeks    Status  New      PT LONG TERM GOAL #3   Title  walk 300 feet without rest    Time  8    Period  Weeks    Status  New      PT LONG TERM GOAL #4   Title  tolerate standing 10 minutes    Time  8    Period  Weeks    Status  New      PT LONG TERM  GOAL #5   Title  vacuum without increase of pain    Time  8    Period  Weeks    Status  New            Plan - 01/05/18 1355    Clinical Impression Statement  Patient with some difficulty iwth pelvic mobility and stability, her ROM was better today, a lot of cues for the pelvic mobility    PT Next Visit Plan  Slowly add exercises and treat pain as needed    Consulted and Agree with Plan of Care  Patient       Patient will benefit from skilled therapeutic intervention in order to improve the following deficits and impairments:  Abnormal gait, Decreased activity tolerance, Decreased balance, Decreased mobility, Decreased strength, Postural dysfunction, Improper body mechanics, Impaired flexibility, Pain, Increased muscle spasms, Decreased range of motion, Difficulty walking  Visit Diagnosis: Pain in left hip  Difficulty in walking, not elsewhere classified  Muscle weakness (generalized)     Problem List Patient Active Problem List   Diagnosis Date Noted  . Depression 12/03/2017  . Normal coronary arteries 12/03/2017  . 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 Polymyalgia rheumatica ([place]) 12/03/2017  . NSTEMI (non-ST elevated myocardial infarction) ([place]) 06/02/2017  . Fracture of rib of right side 03/20/2016  . Acute respiratory failure with hypoxia ([place]) 03/20/2016  . PAF (paroxysmal atrial fibrillation) ([place]) 03/20/2016  . Pneumothorax on right 03/20/2016  . ARDS (adult respiratory distress syndrome) ([place]) 03/20/2016  . Insulin dependent diabetes mellitus ([place]) 03/20/2016  . GERD (gastroesophageal reflux disease) 03/20/2016  . Dysphagia   . Abdominal pain, epigastric   . Gastric polyp   . Anemia, iron deficiency   . Angiodysplasia of cecum   . Colon polyp   . 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 Chronic anticoagulation 07/28/2015  . Family history of colon cancer 07/28/2015  . CN (constipation)  07/28/2015  . Paroxysmal atrial fibrillation ([place]) 04/04/2015  . Carotid  artery disease ([place]) 04/04/2015  . Tobacco abuse 04/04/2015  . Essential hypertension 04/04/2015  . Hyperlipidemia 04/04/2015    Sumner Boast., PT 01/05/2018, 1:57 PM  [address] [place], [place], [address] Phone: [phone]   Fax:  [phone]  Name: Kathleen Griffith MRN: [id number] Date of Birth: 22-Apr-1941

## 2018-01-08 ENCOUNTER — Ambulatory Visit: Payer: Medicare Other | Admitting: Physical Therapy

## 2018-01-08 ENCOUNTER — Encounter: Payer: Self-pay | Admitting: Physical Therapy

## 2018-01-08 DIAGNOSIS — M6281 Muscle weakness (generalized): Secondary | ICD-10-CM

## 2018-01-08 DIAGNOSIS — M25552 Pain in left hip: Secondary | ICD-10-CM

## 2018-01-08 DIAGNOSIS — R262 Difficulty in walking, not elsewhere classified: Secondary | ICD-10-CM

## 2018-01-08 NOTE — Therapy (Signed)
[address] [place], [place], [address] Phone: [phone]   Fax:  [phone]  Physical Therapy Treatment  Patient Details  Name: Kathleen Griffith MRN: [id number] Date of Birth: 02/19/1941 No Data Recorded  Encounter Date: 01/08/2018  PT End of Session - 01/08/18 1720    Visit Number  3    Date for PT Re-Evaluation  02/28/18    PT Start Time  1623    PT Stop Time  1724    PT Time Calculation (min)  61 min    Activity Tolerance  Patient tolerated treatment well    Behavior During Therapy  [facility] for tasks assessed/performed       Past Medical History:  Diagnosis Date  . Anemia    microcytic   . Arthritis   . Atrial fibrillation ([place])   . Cataract    REMOVED BILATERAL  . Colon polyps   . Diabetes mellitus ([place])   . Fibromyalgia   . Gastroparesis   . Hyperlipidemia   . Hypertension   . Kidney stones   . Melanoma ([place])   . PMR (polymyalgia rheumatica) (H[facility])   . PVD (peripheral vascular disease) ([place])     Past Surgical History:  Procedure Laterality Date  . APPENDECTOMY    . BREAST BIOPSY    . CESAREAN SECTION    . COLONOSCOPY    . COLONOSCOPY WITH PROPOFOL N/A 12/26/2015   Procedure: COLONOSCOPY WITH PROPOFOL;  Surgeon: Manus Gunning, MD;  [facility];  Service: Gastroenterology;  Laterality: N/A;  . ESOPHAGOGASTRODUODENOSCOPY (EGD) WITH PROPOFOL N/A 12/26/2015   Procedure: ESOPHAGOGASTRODUODENOSCOPY (EGD) WITH PROPOFOL;  Surgeon: Manus Gunning, MD;  [facility];  Service: Gastroenterology;  Laterality: N/A;  . LEFT HEART CATH AND CORONARY ANGIOGRAPHY N/A 06/03/2017   Procedure: Left Heart Cath and Coronary Angiography;  Surgeon: Martinique, Peter M, MD;  Location: [facility];  Service: Cardiovascular;  Laterality: N/A;  . POLYPECTOMY      There were no vitals filed for this visit.  Subjective Assessment - 01/08/18 1633    Subjective  Patient  reports that she felt a little better, pain is mostly in the back    Currently in Pain?  Yes    Pain Score  5     Pain Location  Back    Aggravating Factors   shopping                      [facility] Adult PT Treatment/Exercise - 01/08/18 0001      Knee/Hip Exercises: Stretches   Passive Hamstring Stretch  3 reps;20 seconds    Piriformis Stretch  3 reps;30 seconds      Knee/Hip Exercises: Aerobic   Nustep  level 4 x 6 minutes      Knee/Hip Exercises: Seated   Ball Squeeze  20 reps    Other Seated Knee/Hip Exercises  on sit fit for pelvic mobility and stability    Other Seated Knee/Hip Exercises  isometric abdominal with ball in sitting      Knee/Hip Exercises: Supine   Bridges with Ball Squeeze  20 reps    Other Supine Knee/Hip Exercises  feet on ball K2C, trunk rotaiton, small bridges and abdominal isometrics      Moist Heat Therapy   Number Minutes Moist Heat  15 Minutes    Moist Heat Location  Lumbar Spine      Electrical Stimulation   Electrical Stimulation  Location  lumbar area    Electrical Stimulation Action  IFC    Electrical Stimulation Parameters  supine    Electrical Stimulation Goals  Pain               PT Short Term Goals - 01/08/18 1722      PT SHORT TERM GOAL #1   Title  independent with initial HEP    Status  Partially Met        PT Long Term Goals - 12/31/17 1641      PT LONG TERM GOAL #1   Title  understand proper posture and body mechanics    Time  8    Period  Weeks    Status  New      PT LONG TERM GOAL #2   Title  decrease pain 50%    Time  8    Period  Weeks    Status  New      PT LONG TERM GOAL #3   Title  walk 300 feet without rest    Time  8    Period  Weeks    Status  New      PT LONG TERM GOAL #4   Title  tolerate standing 10 minutes    Time  8    Period  Weeks    Status  New      PT LONG TERM GOAL #5   Title  vacuum without increase of pain    Time  8    Period  Weeks    Status  New             Plan - 01/08/18 1721    Clinical Impression Statement  Patient with much improved control of pelvic stability today.  Able to do the exercises without as much movements.  C/O more back pain today but reports that she just went shopping    PT Next Visit Plan  Slowly add exercises and treat pain as needed    Consulted and Agree with Plan of Care  Patient       Patient will benefit from skilled therapeutic intervention in order to improve the following deficits and impairments:  Abnormal gait, Decreased activity tolerance, Decreased balance, Decreased mobility, Decreased strength, Postural dysfunction, Improper body mechanics, Impaired flexibility, Pain, Increased muscle spasms, Decreased range of motion, Difficulty walking  Visit Diagnosis: Pain in left hip  Difficulty in walking, not elsewhere classified  Muscle weakness (generalized)     Problem List Patient Active Problem List   Diagnosis Date Noted  . Depression 12/03/2017  . Normal coronary arteries 12/03/2017  . 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 Polymyalgia rheumatica ([place]) 12/03/2017  . NSTEMI (non-ST elevated myocardial infarction) ([place]) 06/02/2017  . Fracture of rib of right side 03/20/2016  . Acute respiratory failure with hypoxia ([place]) 03/20/2016  . PAF (paroxysmal atrial fibrillation) ([place]) 03/20/2016  . Pneumothorax on right 03/20/2016  . ARDS (adult respiratory distress syndrome) ([place]) 03/20/2016  . Insulin dependent diabetes mellitus ([place]) 03/20/2016  . GERD (gastroesophageal reflux disease) 03/20/2016  . Dysphagia   . Abdominal pain, epigastric   . Gastric polyp   . Anemia, iron deficiency   . Angiodysplasia of cecum   . Colon polyp   . 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 Chronic anticoagulation 07/28/2015  . Family history of colon cancer 07/28/2015  . CN (constipation) 07/28/2015  . Paroxysmal atrial fibrillation ([place]) 04/04/2015  . Carotid artery disease ([place]) 04/04/2015  . Tobacco abuse  04/04/2015  .  Essential hypertension 04/04/2015  . Hyperlipidemia 04/04/2015    Sumner Boast., PT 01/08/2018, 5:22 PM  [address] [place], [place], [address] Phone: [phone]   Fax:  [phone]  Name: Randall Rampersad MRN: [id number] Date of Birth: 1941-04-12

## 2018-01-12 ENCOUNTER — Encounter: Payer: Self-pay | Admitting: Physical Therapy

## 2018-01-12 ENCOUNTER — Ambulatory Visit: Payer: Medicare Other | Admitting: Physical Therapy

## 2018-01-12 DIAGNOSIS — M25552 Pain in left hip: Secondary | ICD-10-CM | POA: Diagnosis not present

## 2018-01-12 DIAGNOSIS — R262 Difficulty in walking, not elsewhere classified: Secondary | ICD-10-CM

## 2018-01-12 DIAGNOSIS — M6281 Muscle weakness (generalized): Secondary | ICD-10-CM

## 2018-01-12 NOTE — Therapy (Signed)
[address] [place], [place], [address] Phone: [phone]   Fax:  [phone]  Physical Therapy Treatment  Patient Details  Name: Kathleen Griffith MRN: [id number] Date of Birth: 03-17-1941 No Data Recorded  Encounter Date: 01/12/2018  PT End of Session - 01/12/18 1515    Visit Number  4    Date for PT Re-Evaluation  02/28/18    PT Start Time  1445    PT Stop Time  1530    PT Time Calculation (min)  45 min    Activity Tolerance  Patient tolerated treatment well    Behavior During Therapy  [facility] for tasks assessed/performed       Past Medical History:  Diagnosis Date  . Anemia    microcytic   . Arthritis   . Atrial fibrillation ([place])   . Cataract    REMOVED BILATERAL  . Colon polyps   . Diabetes mellitus ([place])   . Fibromyalgia   . Gastroparesis   . Hyperlipidemia   . Hypertension   . Kidney stones   . Melanoma ([place])   . PMR (polymyalgia rheumatica) (H[facility])   . PVD (peripheral vascular disease) ([place])     Past Surgical History:  Procedure Laterality Date  . APPENDECTOMY    . BREAST BIOPSY    . CESAREAN SECTION    . COLONOSCOPY    . COLONOSCOPY WITH PROPOFOL N/A 12/26/2015   Procedure: COLONOSCOPY WITH PROPOFOL;  Surgeon: Manus Gunning, MD;  Location: [facility];  Service: Gastroenterology;  Laterality: N/A;  . ESOPHAGOGASTRODUODENOSCOPY (EGD) WITH PROPOFOL N/A 12/26/2015   Procedure: ESOPHAGOGASTRODUODENOSCOPY (EGD) WITH PROPOFOL;  Surgeon: Manus Gunning, MD;  Location: [facility];  Service: Gastroenterology;  Laterality: N/A;  . LEFT HEART CATH AND CORONARY ANGIOGRAPHY N/A 06/03/2017   Procedure: Left Heart Cath and Coronary Angiography;  Surgeon: Martinique, Peter M, MD;  Location: [place] [facility];  Service: Cardiovascular;  Laterality: N/A;  . POLYPECTOMY      There were no vitals filed for this visit.  Subjective Assessment - 01/12/18 1452    Subjective  Patient  reports that she felt really good after the last visit, reports that she is really hurting all over today, "could be the cold wet weather"    Currently in Pain?  Yes    Pain Score  7     Pain Location  Back    Aggravating Factors   cold weather    Pain Relieving Factors  the last treatment really helped                      [facility] Adult PT Treatment/Exercise - 01/12/18 0001      Knee/Hip Exercises: Stretches   Passive Hamstring Stretch  3 reps;20 seconds    Piriformis Stretch  3 reps;30 seconds      Knee/Hip Exercises: Aerobic   Nustep  level 4 x 6 minutes      Knee/Hip Exercises: Seated   Ball Squeeze  20 reps    Other Seated Knee/Hip Exercises  isometric abdominal with ball in sitting      Knee/Hip Exercises: Supine   Other Supine Knee/Hip Exercises  feet on ball K2C, trunk rotaiton, small bridges and abdominal isometrics      Moist Heat Therapy   Number Minutes Moist Heat  15 Minutes    Moist Heat Location  Lumbar Spine      Electrical Stimulation   Electrical Stimulation Location  lumbar area    Electrical Stimulation Action  IFC    Electrical Stimulation Parameters  supine    Electrical Stimulation Goals  Pain      Manual Therapy   Manual Therapy  Soft tissue mobilization    Soft tissue mobilization  to the left buttock area, she was very tender and sensitive to this               PT Short Term Goals - 01/12/18 1516      PT SHORT TERM GOAL #1   Title  independent with initial HEP    Status  Achieved        PT Long Term Goals - 12/31/17 1641      PT LONG TERM GOAL #1   Title  understand proper posture and body mechanics    Time  8    Period  Weeks    Status  New      PT LONG TERM GOAL #2   Title  decrease pain 50%    Time  8    Period  Weeks    Status  New      PT LONG TERM GOAL #3   Title  walk 300 feet without rest    Time  8    Period  Weeks    Status  New      PT LONG TERM GOAL #4   Title  tolerate standing 10 minutes     Time  8    Period  Weeks    Status  New      PT LONG TERM GOAL #5   Title  vacuum without increase of pain    Time  8    Period  Weeks    Status  New            Plan - 01/12/18 1515    Clinical Impression Statement  Patient with increased pain today, she was very tender in the left buttock area.  Tried manual therapy but again very tender to palpation    PT Next Visit Plan  may try to add exercises as we go vs STM to the buttock area    Consulted and Agree with Plan of Care  Patient       Patient will benefit from skilled therapeutic intervention in order to improve the following deficits and impairments:  Abnormal gait, Decreased activity tolerance, Decreased balance, Decreased mobility, Decreased strength, Postural dysfunction, Improper body mechanics, Impaired flexibility, Pain, Increased muscle spasms, Decreased range of motion, Difficulty walking  Visit Diagnosis: Pain in left hip  Difficulty in walking, not elsewhere classified  Muscle weakness (generalized)     Problem List Patient Active Problem List   Diagnosis Date Noted  . Depression 12/03/2017  . Normal coronary arteries 12/03/2017  . 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 Polymyalgia rheumatica ([place]) 12/03/2017  . NSTEMI (non-ST elevated myocardial infarction) ([place]) 06/02/2017  . Fracture of rib of right side 03/20/2016  . Acute respiratory failure with hypoxia ([place]) 03/20/2016  . PAF (paroxysmal atrial fibrillation) ([place]) 03/20/2016  . Pneumothorax on right 03/20/2016  . ARDS (adult respiratory distress syndrome) ([place]) 03/20/2016  . Insulin dependent diabetes mellitus ([place]) 03/20/2016  . GERD (gastroesophageal reflux disease) 03/20/2016  . Dysphagia   . Abdominal pain, epigastric   . Gastric polyp   . Anemia, iron deficiency   . Angiodysplasia of cecum   . Colon polyp   . 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 Chronic anticoagulation 07/28/2015  . Family history  of colon cancer 07/28/2015  . CN  (constipation) 07/28/2015  . Paroxysmal atrial fibrillation ([place]) 04/04/2015  . Carotid artery disease ([place]) 04/04/2015  . Tobacco abuse 04/04/2015  . Essential hypertension 04/04/2015  . Hyperlipidemia 04/04/2015    Sumner Boast., PT 01/12/2018, 3:17 PM  [facility] [place] [address] [place], [place], [address] Phone: [phone]   Fax:  [phone]  Name: Kathleen Griffith MRN: [id number] Date of Birth: January 05, 1941

## 2018-01-15 ENCOUNTER — Ambulatory Visit: Payer: Medicare Other | Admitting: Physical Therapy

## 2018-01-19 ENCOUNTER — Encounter: Payer: Self-pay | Admitting: Physical Therapy

## 2018-01-19 ENCOUNTER — Ambulatory Visit: Payer: Medicare Other | Admitting: Physical Therapy

## 2018-01-19 DIAGNOSIS — M25552 Pain in left hip: Secondary | ICD-10-CM

## 2018-01-19 DIAGNOSIS — R262 Difficulty in walking, not elsewhere classified: Secondary | ICD-10-CM

## 2018-01-19 DIAGNOSIS — M6281 Muscle weakness (generalized): Secondary | ICD-10-CM

## 2018-01-19 NOTE — Therapy (Signed)
[address] [place], [place], [address] Phone: [phone]   Fax:  [phone]  Physical Therapy Treatment  Patient Details  Name: Kathleen Griffith MRN: [id number] Date of Birth: Jan 16, 1941 No Data Recorded  Encounter Date: 01/19/2018  PT End of Session - 01/19/18 1641    Visit Number  5    Date for PT Re-Evaluation  02/28/18    PT Start Time  1615    PT Stop Time  1702    PT Time Calculation (min)  47 min    Activity Tolerance  Patient tolerated treatment well    Behavior During Therapy  [facility] for tasks assessed/performed       Past Medical History:  Diagnosis Date  . Anemia    microcytic   . Arthritis   . Atrial fibrillation ([place])   . Cataract    REMOVED BILATERAL  . Colon polyps   . Diabetes mellitus ([place])   . Fibromyalgia   . Gastroparesis   . Hyperlipidemia   . Hypertension   . Kidney stones   . Melanoma ([place])   . PMR (polymyalgia rheumatica) (H[facility])   . PVD (peripheral vascular disease) ([place])     Past Surgical History:  Procedure Laterality Date  . APPENDECTOMY    . BREAST BIOPSY    . CESAREAN SECTION    . COLONOSCOPY    . COLONOSCOPY WITH PROPOFOL N/A 12/26/2015   Procedure: COLONOSCOPY WITH PROPOFOL;  Surgeon: Manus Gunning, MD;  [facility];  Service: Gastroenterology;  Laterality: N/A;  . ESOPHAGOGASTRODUODENOSCOPY (EGD) WITH PROPOFOL N/A 12/26/2015   Procedure: ESOPHAGOGASTRODUODENOSCOPY (EGD) WITH PROPOFOL;  Surgeon: Manus Gunning, MD;  [facility];  Service: Gastroenterology;  Laterality: N/A;  . LEFT HEART CATH AND CORONARY ANGIOGRAPHY N/A 06/03/2017   Procedure: Left Heart Cath and Coronary Angiography;  Surgeon: Martinique, Peter M, MD;  Location: [facility];  Service: Cardiovascular;  Laterality: N/A;  . POLYPECTOMY      There were no vitals filed for this visit.  Subjective Assessment - 01/19/18 1618    Subjective  Patient  reports sore and achy all over last week.  She does not think it was anything that we did.    Currently in Pain?  Yes    Pain Score  8     Pain Location  Back    Pain Orientation  Left;Posterior                      [facility] Adult PT Treatment/Exercise - 01/19/18 0001      Knee/Hip Exercises: Stretches   Passive Hamstring Stretch  3 reps;20 seconds    Piriformis Stretch  3 reps;30 seconds      Knee/Hip Exercises: Aerobic   Nustep  level 4 x 6 minutes      Knee/Hip Exercises: Seated   Ball Squeeze  20 reps    Other Seated Knee/Hip Exercises  seated scapular stabilization exercises    Other Seated Knee/Hip Exercises  isometric abdominal with ball in sitting      Knee/Hip Exercises: Supine   Bridges with Ball Squeeze  20 reps    Other Supine Knee/Hip Exercises  feet on ball K2C, trunk rotaiton, small bridges and abdominal isometrics      Moist Heat Therapy   Number Minutes Moist Heat  15 Minutes    Moist Heat Location  Lumbar Spine      Electrical Stimulation   Electrical  Stimulation Location  lumbar area    Electrical Stimulation Action  IFC    Electrical Stimulation Parameters  supine    Electrical Stimulation Goals  Pain               PT Short Term Goals - 01/12/18 1516      PT SHORT TERM GOAL #1   Title  independent with initial HEP    Status  Achieved        PT Long Term Goals - 12/31/17 1641      PT LONG TERM GOAL #1   Title  understand proper posture and body mechanics    Time  8    Period  Weeks    Status  New      PT LONG TERM GOAL #2   Title  decrease pain 50%    Time  8    Period  Weeks    Status  New      PT LONG TERM GOAL #3   Title  walk 300 feet without rest    Time  8    Period  Weeks    Status  New      PT LONG TERM GOAL #4   Title  tolerate standing 10 minutes    Time  8    Period  Weeks    Status  New      PT LONG TERM GOAL #5   Title  vacuum without increase of pain    Time  8    Period  Weeks    Status   New            Plan - 01/19/18 1642    Clinical Impression Statement  Patient again c/o increased soreness, she reports that she is unsure of why, reports that she does not feel that it is anything with our exercises.    PT Next Visit Plan  may try to add exercises as we go vs STM to the buttock area    Consulted and Agree with Plan of Care  Patient       Patient will benefit from skilled therapeutic intervention in order to improve the following deficits and impairments:  Abnormal gait, Decreased activity tolerance, Decreased balance, Decreased mobility, Decreased strength, Postural dysfunction, Improper body mechanics, Impaired flexibility, Pain, Increased muscle spasms, Decreased range of motion, Difficulty walking  Visit Diagnosis: Pain in left hip  Difficulty in walking, not elsewhere classified  Muscle weakness (generalized)     Problem List Patient Active Problem List   Diagnosis Date Noted  . Depression 12/03/2017  . Normal coronary arteries 12/03/2017  . 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 Polymyalgia rheumatica ([place]) 12/03/2017  . NSTEMI (non-ST elevated myocardial infarction) ([place]) 06/02/2017  . Fracture of rib of right side 03/20/2016  . Acute respiratory failure with hypoxia ([place]) 03/20/2016  . PAF (paroxysmal atrial fibrillation) ([place]) 03/20/2016  . Pneumothorax on right 03/20/2016  . ARDS (adult respiratory distress syndrome) ([place]) 03/20/2016  . Insulin dependent diabetes mellitus ([place]) 03/20/2016  . GERD (gastroesophageal reflux disease) 03/20/2016  . Dysphagia   . Abdominal pain, epigastric   . Gastric polyp   . Anemia, iron deficiency   . Angiodysplasia of cecum   . Colon polyp   . 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 Chronic anticoagulation 07/28/2015  . Family history of colon cancer 07/28/2015  . CN (constipation) 07/28/2015  . Paroxysmal atrial fibrillation ([place]) 04/04/2015  . Carotid artery disease ([place]) 04/04/2015  . Tobacco abuse  04/04/2015  .  Essential hypertension 04/04/2015  . Hyperlipidemia 04/04/2015    Sumner Boast., PT 01/19/2018, 4:43 PM  [facility] [address] [place], [place], [address] Phone: [phone]   Fax:  [phone]  Name: Dasani Thurlow MRN: [id number] Date of Birth: 1941/07/13

## 2018-01-21 ENCOUNTER — Ambulatory Visit: Payer: Medicare Other | Admitting: Physical Therapy

## 2018-01-26 ENCOUNTER — Telehealth: Payer: Self-pay | Admitting: Cardiology

## 2018-01-26 ENCOUNTER — Encounter: Payer: Self-pay | Admitting: Physical Therapy

## 2018-01-26 ENCOUNTER — Ambulatory Visit: Payer: Medicare Other | Admitting: Physical Therapy

## 2018-01-26 DIAGNOSIS — M25552 Pain in left hip: Secondary | ICD-10-CM

## 2018-01-26 DIAGNOSIS — R262 Difficulty in walking, not elsewhere classified: Secondary | ICD-10-CM

## 2018-01-26 DIAGNOSIS — M6281 Muscle weakness (generalized): Secondary | ICD-10-CM

## 2018-01-26 NOTE — Therapy (Signed)
[address] [place], [place], [address] Phone: [phone]   Fax:  [phone]  Physical Therapy Treatment  Patient Details  Name: Kathleen Griffith MRN: [id number] Date of Birth: Aug 19, 1941 No Data Recorded  Encounter Date: 01/26/2018  PT End of Session - 01/26/18 1622    Visit Number  6    Date for PT Re-Evaluation  02/28/18    PT Start Time  1829    PT Stop Time  1641    PT Time Calculation (min)  48 min    Activity Tolerance  Patient tolerated treatment well    Behavior During Therapy  [facility] for tasks assessed/performed       Past Medical History:  Diagnosis Date  . Anemia    microcytic   . Arthritis   . Atrial fibrillation ([place])   . Cataract    REMOVED BILATERAL  . Colon polyps   . Diabetes mellitus ([place])   . Fibromyalgia   . Gastroparesis   . Hyperlipidemia   . Hypertension   . Kidney stones   . Melanoma ([place])   . PMR (polymyalgia rheumatica) (H[facility])   . PVD (peripheral vascular disease) ([place])     Past Surgical History:  Procedure Laterality Date  . APPENDECTOMY    . BREAST BIOPSY    . CESAREAN SECTION    . COLONOSCOPY    . COLONOSCOPY WITH PROPOFOL N/A 12/26/2015   Procedure: COLONOSCOPY WITH PROPOFOL;  Surgeon: Manus Gunning, MD;  [facility];  Service: Gastroenterology;  Laterality: N/A;  . ESOPHAGOGASTRODUODENOSCOPY (EGD) WITH PROPOFOL N/A 12/26/2015   Procedure: ESOPHAGOGASTRODUODENOSCOPY (EGD) WITH PROPOFOL;  Surgeon: Manus Gunning, MD;  [facility];  Service: Gastroenterology;  Laterality: N/A;  . LEFT HEART CATH AND CORONARY ANGIOGRAPHY N/A 06/03/2017   Procedure: Left Heart Cath and Coronary Angiography;  Surgeon: Martinique, Peter M, MD;  Location: [facility];  Service: Cardiovascular;  Laterality: N/A;  . POLYPECTOMY      There were no vitals filed for this visit.  Subjective Assessment - 01/26/18 1554    Subjective  "Knees,  elbows, hips and back"    Currently in Pain?  Yes    Pain Score  4     Pain Location  -- all over                      [facility] Adult PT Treatment/Exercise - 01/26/18 0001      Knee/Hip Exercises: Stretches   Passive Hamstring Stretch  3 reps;20 seconds    Piriformis Stretch  3 reps;30 seconds      Knee/Hip Exercises: Aerobic   Nustep  level 4 x 6 minutes      Knee/Hip Exercises: Seated   Other Seated Knee/Hip Exercises  seated scapular stabilization exercises      Knee/Hip Exercises: Supine   Bridges with Ball Squeeze  20 reps    Other Supine Knee/Hip Exercises  feet on ball K2C, trunk rotaiton, small bridges and abdominal isometrics    Other Supine Knee/Hip Exercises  ab sets p ball 2x10      Moist Heat Therapy   Number Minutes Moist Heat  15 Minutes    Moist Heat Location  Lumbar Spine      Electrical Stimulation   Electrical Stimulation Location  lumbar area    Electrical Stimulation Action  IFC    Electrical Stimulation Parameters  supine    Electrical Stimulation Goals  Pain  PT Short Term Goals - 01/12/18 1516      PT SHORT TERM GOAL #1   Title  independent with initial HEP    Status  Achieved        PT Long Term Goals - 12/31/17 1641      PT LONG TERM GOAL #1   Title  understand proper posture and body mechanics    Time  8    Period  Weeks    Status  New      PT LONG TERM GOAL #2   Title  decrease pain 50%    Time  8    Period  Weeks    Status  New      PT LONG TERM GOAL #3   Title  walk 300 feet without rest    Time  8    Period  Weeks    Status  New      PT LONG TERM GOAL #4   Title  tolerate standing 10 minutes    Time  8    Period  Weeks    Status  New      PT LONG TERM GOAL #5   Title  vacuum without increase of pain    Time  8    Period  Weeks    Status  New            Plan - 01/26/18 1628    Clinical Impression Statement  Shorten time due to pt reports of increase fatigue. She reports  pain all over but is unsure why. Little effort with supine exercises.    PT Treatment/Interventions  ADLs/Self Care Home Management;Cryotherapy;Electrical Stimulation;Iontophoresis 4mg /ml Dexamethasone;Moist Heat;Ultrasound;Gait training;Balance training;Therapeutic exercise;Therapeutic activities;Functional mobility training;Patient/family education;Manual techniques    PT Next Visit Plan  may try to add exercises as we go vs STM to the buttock area       Patient will benefit from skilled therapeutic intervention in order to improve the following deficits and impairments:  Abnormal gait, Decreased activity tolerance, Decreased balance, Decreased mobility, Decreased strength, Postural dysfunction, Improper body mechanics, Impaired flexibility, Pain, Increased muscle spasms, Decreased range of motion, Difficulty walking  Visit Diagnosis: Pain in left hip  Difficulty in walking, not elsewhere classified  Muscle weakness (generalized)     Problem List Patient Active Problem List   Diagnosis Date Noted  . Depression 12/03/2017  . Normal coronary arteries 12/03/2017  . 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 Polymyalgia rheumatica ([place]) 12/03/2017  . NSTEMI (non-ST elevated myocardial infarction) ([place]) 06/02/2017  . Fracture of rib of right side 03/20/2016  . Acute respiratory failure with hypoxia ([place]) 03/20/2016  . PAF (paroxysmal atrial fibrillation) ([place]) 03/20/2016  . Pneumothorax on right 03/20/2016  . ARDS (adult respiratory distress syndrome) ([place]) 03/20/2016  . Insulin dependent diabetes mellitus ([place]) 03/20/2016  . GERD (gastroesophageal reflux disease) 03/20/2016  . Dysphagia   . Abdominal pain, epigastric   . Gastric polyp   . Anemia, iron deficiency   . Angiodysplasia of cecum   . Colon polyp   . 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 Chronic anticoagulation 07/28/2015  . Family history of colon cancer 07/28/2015  . CN (constipation) 07/28/2015  . Paroxysmal atrial  fibrillation ([place]) 04/04/2015  . Carotid artery disease ([place]) 04/04/2015  . Tobacco abuse 04/04/2015  . Essential hypertension 04/04/2015  . Hyperlipidemia 04/04/2015    Scot Jun, PTA 01/26/2018, 4:30 PM  [address], [place], [address]  Phone: [phone]   Fax:  [phone]  Name: Jania Steinke MRN: [id number] Date of Birth: 1941-05-12

## 2018-01-26 NOTE — Telephone Encounter (Signed)
Left message for pt to call.

## 2018-01-26 NOTE — Telephone Encounter (Signed)
Mrs.Pennella is calling because she has been having AFIB more often than normal . States she feels bad . Please call

## 2018-01-27 NOTE — Telephone Encounter (Signed)
Agree with plan Kathleen Griffith  

## 2018-01-27 NOTE — Telephone Encounter (Signed)
Spoke with pt, she is having more atrial fib than her usual. She has had 4 episodes since December. The last episode was Sunday and it lasted about 8 hours. Her heart rate can get as high as 137. She has also noticed her bp is running higher as well, 140-150/80. Also for the last several days she is having soreness in her muscles and joints. She is taking PT for her hip and it is better. She also feels her depression has improved since christmas. Okay given for patient to stop crestor to see if that helps with her muscle and joint pain. She is taking that daily now. Also instructed patient to increase metoprolol to 50 mg twice daily. This will help with bp and hopefully keep her from having the atrial fib as much. She has a follow up in march but will call me next week to let me know how she is feeling. Will forward to dr Stanford Breed for further recommendations. Pt agreed with this plan.

## 2018-01-27 NOTE — Telephone Encounter (Signed)
Pt returned your call please.

## 2018-01-29 ENCOUNTER — Ambulatory Visit: Payer: Medicare Other | Admitting: Physical Therapy

## 2018-02-02 ENCOUNTER — Ambulatory Visit: Payer: Medicare Other | Admitting: Physical Therapy

## 2018-02-03 ENCOUNTER — Telehealth: Payer: Self-pay | Admitting: Cardiology

## 2018-02-03 NOTE — Telephone Encounter (Signed)
Spoke with pt, her heart rate is better but her bp is still running in the 150's. She has complaints of pain all over. She reports she is miserable and does not know what to do. Explained to the patient, she will need to get her pain under control to get her bp down. She has had no more atrial fib since the increase of the metoprolol. She is going to call her rheumatologist to try to get help with her pain.

## 2018-02-03 NOTE — Telephone Encounter (Signed)
Kathleen Griffith is asking that you give a call back please . Thanks

## 2018-02-05 ENCOUNTER — Ambulatory Visit: Payer: Medicare Other | Admitting: Physical Therapy

## 2018-02-09 ENCOUNTER — Ambulatory Visit: Payer: Medicare Other | Attending: Orthopedic Surgery | Admitting: Physical Therapy

## 2018-02-09 ENCOUNTER — Encounter: Payer: Self-pay | Admitting: Physical Therapy

## 2018-02-09 DIAGNOSIS — M25552 Pain in left hip: Secondary | ICD-10-CM

## 2018-02-09 DIAGNOSIS — R262 Difficulty in walking, not elsewhere classified: Secondary | ICD-10-CM | POA: Diagnosis present

## 2018-02-09 DIAGNOSIS — M6281 Muscle weakness (generalized): Secondary | ICD-10-CM | POA: Diagnosis present

## 2018-02-09 NOTE — Therapy (Signed)
[address] [place], [place], [address] Phone: [phone]   Fax:  [phone]  Physical Therapy Treatment  Patient Details  Name: Kathleen Griffith MRN: [id number] Date of Birth: October 21, 1941 No Data Recorded  Encounter Date: 02/09/2018  PT End of Session - 02/09/18 1640    Visit Number  7    Date for PT Re-Evaluation  02/28/18    PT Start Time  1600    PT Stop Time  1654    PT Time Calculation (min)  54 min    Activity Tolerance  Patient limited by pain    Behavior During Therapy  [facility] for tasks assessed/performed       Past Medical History:  Diagnosis Date  . Anemia    microcytic   . Arthritis   . Atrial fibrillation ([place])   . Cataract    REMOVED BILATERAL  . Colon polyps   . Diabetes mellitus ([place])   . Fibromyalgia   . Gastroparesis   . Hyperlipidemia   . Hypertension   . Kidney stones   . Melanoma ([place])   . PMR (polymyalgia rheumatica) (HCC)   . PVD (peripheral vascular disease) ([place])     Past Surgical History:  Procedure Laterality Date  . APPENDECTOMY    . BREAST BIOPSY    . CESAREAN SECTION    . COLONOSCOPY    . COLONOSCOPY WITH PROPOFOL N/A 12/26/2015   Procedure: COLONOSCOPY WITH PROPOFOL;  Surgeon: Manus Gunning, MD;  [facility];  Service: Gastroenterology;  Laterality: N/A;  . ESOPHAGOGASTRODUODENOSCOPY (EGD) WITH PROPOFOL N/A 12/26/2015   Procedure: ESOPHAGOGASTRODUODENOSCOPY (EGD) WITH PROPOFOL;  Surgeon: Manus Gunning, MD;  [facility];  Service: Gastroenterology;  Laterality: N/A;  . LEFT HEART CATH AND CORONARY ANGIOGRAPHY N/A 06/03/2017   Procedure: Left Heart Cath and Coronary Angiography;  Surgeon: Martinique, Peter M, MD;  Location: [place] [facility];  Service: Cardiovascular;  Laterality: N/A;  . POLYPECTOMY      There were no vitals filed for this visit.  Subjective Assessment - 02/09/18 1603    Subjective  "I think it is the  Crestor. I hurt from my hips to my ankles" Pt reports that the doctor told her it could take months to get over. Pt reports that she has been miserable     Currently in Pain?  Yes    Pain Score  8     Pain Location  -- hips down                      [facility] Adult PT Treatment/Exercise - 02/09/18 0001      Knee/Hip Exercises: Stretches   Passive Hamstring Stretch  4 reps;10 seconds    Piriformis Stretch  3 reps;30 seconds      Modalities   Modalities  Electrical Stimulation;Moist Heat;Ultrasound      Moist Heat Therapy   Number Minutes Moist Heat  15 Minutes    Moist Heat Location  Lumbar Spine      Electrical Stimulation   Electrical Stimulation Location  lumbar area    Electrical Stimulation Action  IFC    Electrical Stimulation Parameters  supine    Electrical Stimulation Goals  Pain      Ultrasound   Ultrasound Location  R hip    Ultrasound Parameters  1Mhz 1.0w/cm2    Ultrasound Goals  Pain  PT Short Term Goals - 01/12/18 1516      PT SHORT TERM GOAL #1   Title  independent with initial HEP    Status  Achieved        PT Long Term Goals - 12/31/17 1641      PT LONG TERM GOAL #1   Title  understand proper posture and body mechanics    Time  8    Period  Weeks    Status  New      PT LONG TERM GOAL #2   Title  decrease pain 50%    Time  8    Period  Weeks    Status  New      PT LONG TERM GOAL #3   Title  walk 300 feet without rest    Time  8    Period  Weeks    Status  New      PT LONG TERM GOAL #4   Title  tolerate standing 10 minutes    Time  8    Period  Weeks    Status  New      PT LONG TERM GOAL #5   Title  vacuum without increase of pain    Time  8    Period  Weeks    Status  New            Plan - 02/09/18 1640    Clinical Impression Statement  patient enters clinic reporting that she did not feel like doing anything today due to increase hip pain. Pt stated that's her MD wanted her to try "Deep  tissue, ultrasound". [place] to R GT after some passive stretching.    Rehab Potential  Good    PT Frequency  2x / week    PT Duration  8 weeks    PT Next Visit Plan  assess Tx,may try to add exercises as we go vs STM to the buttock area       Patient will benefit from skilled therapeutic intervention in order to improve the following deficits and impairments:  Abnormal gait, Decreased activity tolerance, Decreased balance, Decreased mobility, Decreased strength, Postural dysfunction, Improper body mechanics, Impaired flexibility, Pain, Increased muscle spasms, Decreased range of motion, Difficulty walking  Visit Diagnosis: Pain in left hip  Difficulty in walking, not elsewhere classified  Muscle weakness (generalized)     Problem List Patient Active Problem List   Diagnosis Date Noted  . Depression 12/03/2017  . Normal coronary arteries 12/03/2017  . 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 Polymyalgia rheumatica ([place]) 12/03/2017  . NSTEMI (non-ST elevated myocardial infarction) ([place]) 06/02/2017  . Fracture of rib of right side 03/20/2016  . Acute respiratory failure with hypoxia ([place]) 03/20/2016  . PAF (paroxysmal atrial fibrillation) ([place]) 03/20/2016  . Pneumothorax on right 03/20/2016  . ARDS (adult respiratory distress syndrome) ([place]) 03/20/2016  . Insulin dependent diabetes mellitus ([place]) 03/20/2016  . GERD (gastroesophageal reflux disease) 03/20/2016  . Dysphagia   . Abdominal pain, epigastric   . Gastric polyp   . Anemia, iron deficiency   . Angiodysplasia of cecum   . Colon polyp   . 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 Chronic anticoagulation 07/28/2015  . Family history of colon cancer 07/28/2015  . CN (constipation) 07/28/2015  . Paroxysmal atrial fibrillation ([place]) 04/04/2015  . Carotid artery disease ([place]) 04/04/2015  . Tobacco abuse 04/04/2015  . Essential hypertension 04/04/2015  . Hyperlipidemia 04/04/2015    Scot Jun, PTA 02/09/2018, 4:46 PM  [address] [place], [place], [address] Phone: [phone]   Fax:  [phone]  Name: Kathleen Griffith MRN: [id number] Date of Birth: 1941-08-03

## 2018-02-12 ENCOUNTER — Ambulatory Visit: Payer: Medicare Other | Admitting: Physical Therapy

## 2018-02-12 ENCOUNTER — Encounter: Payer: Self-pay | Admitting: Physical Therapy

## 2018-02-12 DIAGNOSIS — R262 Difficulty in walking, not elsewhere classified: Secondary | ICD-10-CM

## 2018-02-12 DIAGNOSIS — M25552 Pain in left hip: Secondary | ICD-10-CM | POA: Diagnosis not present

## 2018-02-12 DIAGNOSIS — M6281 Muscle weakness (generalized): Secondary | ICD-10-CM

## 2018-02-12 NOTE — Therapy (Signed)
[address] [place], [place], [address] Phone: [phone]   Fax:  [phone]  Physical Therapy Treatment  Patient Details  Name: Kathleen Griffith MRN: [id number] Date of Birth: 05-01-1941 No Data Recorded  Encounter Date: 02/12/2018  PT End of Session - 02/12/18 1724    Visit Number  8    Date for PT Re-Evaluation  02/28/18    PT Start Time  1615    PT Stop Time  1710    PT Time Calculation (min)  55 min    Activity Tolerance  Patient limited by pain    Behavior During Therapy  [facility] for tasks assessed/performed       Past Medical History:  Diagnosis Date  . Anemia    microcytic   . Arthritis   . Atrial fibrillation ([place])   . Cataract    REMOVED BILATERAL  . Colon polyps   . Diabetes mellitus ([place])   . Fibromyalgia   . Gastroparesis   . Hyperlipidemia   . Hypertension   . Kidney stones   . Melanoma ([place])   . PMR (polymyalgia rheumatica) (HCC)   . PVD (peripheral vascular disease) ([place])     Past Surgical History:  Procedure Laterality Date  . APPENDECTOMY    . BREAST BIOPSY    . CESAREAN SECTION    . COLONOSCOPY    . COLONOSCOPY WITH PROPOFOL N/A 12/26/2015   Procedure: COLONOSCOPY WITH PROPOFOL;  Surgeon: Manus Gunning, MD;  [facility];  Service: Gastroenterology;  Laterality: N/A;  . ESOPHAGOGASTRODUODENOSCOPY (EGD) WITH PROPOFOL N/A 12/26/2015   Procedure: ESOPHAGOGASTRODUODENOSCOPY (EGD) WITH PROPOFOL;  Surgeon: Manus Gunning, MD;  [facility];  Service: Gastroenterology;  Laterality: N/A;  . LEFT HEART CATH AND CORONARY ANGIOGRAPHY N/A 06/03/2017   Procedure: Left Heart Cath and Coronary Angiography;  Surgeon: Martinique, Peter M, MD;  Location: [place] [facility];  Service: Cardiovascular;  Laterality: N/A;  . POLYPECTOMY      There were no vitals filed for this visit.  Subjective Assessment - 02/12/18 1650    Subjective  Patient reporting  increase LE pain over the past week.  Reports pain in the shins and thighs    Currently in Pain?  Yes    Pain Score  8     Pain Location  Leg    Pain Orientation  Left;Right;Lower;Upper    Pain Descriptors / Indicators  Aching;Sore    Aggravating Factors   walking, standing    Pain Relieving Factors  the treatment helps                      [facility] Adult PT Treatment/Exercise - 02/12/18 0001      Knee/Hip Exercises: Seated   Other Seated Knee/Hip Exercises  seated foot on airex heel toe and circles      Moist Heat Therapy   Number Minutes Moist Heat  15 Minutes    Moist Heat Location  Knee      Electrical Stimulation   Electrical Stimulation Location  bilateral shin areas    Electrical Stimulation Action  pre mod    Electrical Stimulation Parameters  supine    Electrical Stimulation Goals  Pain      Manual Therapy   Manual Therapy  Soft tissue mobilization;Passive ROM    Soft tissue mobilization  use of roller and palm to work on the anterior tib and the calf and ITB, also used the  r anterior tibs, the ITB and the quadsoller to t    Passive ROM  calves, HS and piriformis mms               PT Short Term Goals - 01/12/18 1516      PT SHORT TERM GOAL #1   Title  independent with initial HEP    Status  Achieved        PT Long Term Goals - 02/12/18 1726      PT LONG TERM GOAL #1   Title  understand proper posture and body mechanics    Status  On-going      PT LONG TERM GOAL #2   Title  decrease pain 50%    Status  On-going            Plan - 02/12/18 1724    Clinical Impression Statement  Patient report sthat she saw the MD and felt like STM may help.  She is very tight and tender in the anterior tibialis bilaterally and the ITB and quads.  She is very tight in the calves, the HS and piriformis.  I checked Homan's bilaterally and it was negative    PT Next Visit Plan  assess Tx,may try to add exercises as we go vs STM to the buttock area     Consulted and Agree with Plan of Care  Patient       Patient will benefit from skilled therapeutic intervention in order to improve the following deficits and impairments:  Abnormal gait, Decreased activity tolerance, Decreased balance, Decreased mobility, Decreased strength, Postural dysfunction, Improper body mechanics, Impaired flexibility, Pain, Increased muscle spasms, Decreased range of motion, Difficulty walking  Visit Diagnosis: Pain in left hip  Difficulty in walking, not elsewhere classified  Muscle weakness (generalized)     Problem List Patient Active Problem List   Diagnosis Date Noted  . Depression 12/03/2017  . Normal coronary arteries 12/03/2017  . 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 Polymyalgia rheumatica ([place]) 12/03/2017  . NSTEMI (non-ST elevated myocardial infarction) ([place]) 06/02/2017  . Fracture of rib of right side 03/20/2016  . Acute respiratory failure with hypoxia ([place]) 03/20/2016  . PAF (paroxysmal atrial fibrillation) ([place]) 03/20/2016  . Pneumothorax on right 03/20/2016  . ARDS (adult respiratory distress syndrome) ([place]) 03/20/2016  . Insulin dependent diabetes mellitus ([place]) 03/20/2016  . GERD (gastroesophageal reflux disease) 03/20/2016  . Dysphagia   . Abdominal pain, epigastric   . Gastric polyp   . Anemia, iron deficiency   . Angiodysplasia of cecum   . Colon polyp   . 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 Chronic anticoagulation 07/28/2015  . Family history of colon cancer 07/28/2015  . CN (constipation) 07/28/2015  . Paroxysmal atrial fibrillation ([place]) 04/04/2015  . Carotid artery disease ([place]) 04/04/2015  . Tobacco abuse 04/04/2015  . Essential hypertension 04/04/2015  . Hyperlipidemia 04/04/2015    Sumner Boast., PT 02/12/2018, 5:26 PM  [address] [place], [place], [address] Phone: [phone]   Fax:  [phone]  Name: Fern Canova MRN:  [id number] Date of Birth: 11-11-1941

## 2018-02-16 ENCOUNTER — Ambulatory Visit: Payer: Medicare Other | Admitting: Physical Therapy

## 2018-02-19 ENCOUNTER — Ambulatory Visit: Payer: Medicare Other | Admitting: Physical Therapy

## 2018-02-19 ENCOUNTER — Encounter: Payer: Self-pay | Admitting: Physical Therapy

## 2018-02-19 DIAGNOSIS — M25552 Pain in left hip: Secondary | ICD-10-CM

## 2018-02-19 DIAGNOSIS — M6281 Muscle weakness (generalized): Secondary | ICD-10-CM

## 2018-02-19 DIAGNOSIS — R262 Difficulty in walking, not elsewhere classified: Secondary | ICD-10-CM

## 2018-02-19 NOTE — Therapy (Signed)
[address] [place], [place], [address] Phone: [phone]   Fax:  [phone]  Physical Therapy Treatment  Patient Details  Name: Kathleen Griffith MRN: [id number] Date of Birth: February 05, 1941 No Data Recorded  Encounter Date: 02/19/2018  PT End of Session - 02/19/18 1639    Visit Number  9    Date for PT Re-Evaluation  02/28/18    PT Start Time  1615    PT Stop Time  1700    PT Time Calculation (min)  45 min    Activity Tolerance  Patient limited by pain    Behavior During Therapy  [facility] for tasks assessed/performed       Past Medical History:  Diagnosis Date  . Anemia    microcytic   . Arthritis   . Atrial fibrillation ([place])   . Cataract    REMOVED BILATERAL  . Colon polyps   . Diabetes mellitus ([place])   . Fibromyalgia   . Gastroparesis   . Hyperlipidemia   . Hypertension   . Kidney stones   . Melanoma ([place])   . PMR (polymyalgia rheumatica) (HCC)   . PVD (peripheral vascular disease) ([place])     Past Surgical History:  Procedure Laterality Date  . APPENDECTOMY    . BREAST BIOPSY    . CESAREAN SECTION    . COLONOSCOPY    . COLONOSCOPY WITH PROPOFOL N/A 12/26/2015   Procedure: COLONOSCOPY WITH PROPOFOL;  Surgeon: Manus Gunning, MD;  [facility];  Service: Gastroenterology;  Laterality: N/A;  . ESOPHAGOGASTRODUODENOSCOPY (EGD) WITH PROPOFOL N/A 12/26/2015   Procedure: ESOPHAGOGASTRODUODENOSCOPY (EGD) WITH PROPOFOL;  Surgeon: Manus Gunning, MD;  [facility];  Service: Gastroenterology;  Laterality: N/A;  . LEFT HEART CATH AND CORONARY ANGIOGRAPHY N/A 06/03/2017   Procedure: Left Heart Cath and Coronary Angiography;  Surgeon: Martinique, Peter M, MD;  Location: [place] [facility];  Service: Cardiovascular;  Laterality: N/A;  . POLYPECTOMY      There were no vitals filed for this visit.                   [facility] Adult PT Treatment/Exercise -  02/19/18 0001      Knee/Hip Exercises: Stretches   Passive Hamstring Stretch  4 reps;20 seconds    Piriformis Stretch  4 reps;30 seconds    Gastroc Stretch  4 reps;20 seconds      Knee/Hip Exercises: Aerobic   Nustep  level 4 x 5 minutes      Knee/Hip Exercises: Seated   Other Seated Knee/Hip Exercises  seated foot on airex heel toe and circles      Knee/Hip Exercises: Supine   Other Supine Knee/Hip Exercises  feet on ball K2C, trunk rotaiton, small bridges and abdominal isometrics    Other Supine Knee/Hip Exercises  ab sets p ball 2x10      Moist Heat Therapy   Number Minutes Moist Heat  15 Minutes    Moist Heat Location  Knee      Electrical Stimulation   Electrical Stimulation Location  lumbar area    Electrical Stimulation Action  IFC    Electrical Stimulation Parameters  supine    Electrical Stimulation Goals  Pain               PT Short Term Goals - 01/12/18 1516      PT SHORT TERM GOAL #1   Title  independent with initial HEP  Status  Achieved        PT Long Term Goals - 02/19/18 1641      PT LONG TERM GOAL #2   Title  decrease pain 50%    Status  On-going      PT LONG TERM GOAL #3   Title  walk 300 feet without rest    Status  On-going      PT LONG TERM GOAL #4   Title  tolerate standing 10 minutes    Status  On-going      PT LONG TERM GOAL #5   Title  vacuum without increase of pain    Status  On-going            Plan - 02/19/18 1639    Clinical Impression Statement  Patient reports that she has continued to have back and leg pain, also c/o swelling in the legs.  She was able to tolerate the exercises, reports that the stretches seem to help.  Spoke to her about the benefits of exercises and movements    PT Next Visit Plan  try to continue the exercises    Consulted and Agree with Plan of Care  Patient       Patient will benefit from skilled therapeutic intervention in order to improve the following deficits and impairments:   Abnormal gait, Decreased activity tolerance, Decreased balance, Decreased mobility, Decreased strength, Postural dysfunction, Improper body mechanics, Impaired flexibility, Pain, Increased muscle spasms, Decreased range of motion, Difficulty walking  Visit Diagnosis: Pain in left hip  Difficulty in walking, not elsewhere classified  Muscle weakness (generalized)     Problem List Patient Active Problem List   Diagnosis Date Noted  . Depression 12/03/2017  . Normal coronary arteries 12/03/2017  . 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 Polymyalgia rheumatica ([place]) 12/03/2017  . NSTEMI (non-ST elevated myocardial infarction) ([place]) 06/02/2017  . Fracture of rib of right side 03/20/2016  . Acute respiratory failure with hypoxia ([place]) 03/20/2016  . PAF (paroxysmal atrial fibrillation) ([place]) 03/20/2016  . Pneumothorax on right 03/20/2016  . ARDS (adult respiratory distress syndrome) ([place]) 03/20/2016  . Insulin dependent diabetes mellitus ([place]) 03/20/2016  . GERD (gastroesophageal reflux disease) 03/20/2016  . Dysphagia   . Abdominal pain, epigastric   . Gastric polyp   . Anemia, iron deficiency   . Angiodysplasia of cecum   . Colon polyp   . 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 Chronic anticoagulation 07/28/2015  . Family history of colon cancer 07/28/2015  . CN (constipation) 07/28/2015  . Paroxysmal atrial fibrillation ([place]) 04/04/2015  . Carotid artery disease ([place]) 04/04/2015  . Tobacco abuse 04/04/2015  . Essential hypertension 04/04/2015  . Hyperlipidemia 04/04/2015    Sumner Boast., PT 02/19/2018, 4:42 PM  [address] [place], [place], [address] Phone: [phone]   Fax:  [phone]  Name: Kathleen Griffith MRN: [id number] Date of Birth: March 08, 1941

## 2018-02-23 ENCOUNTER — Ambulatory Visit: Payer: Medicare Other | Admitting: Physical Therapy

## 2018-02-23 ENCOUNTER — Encounter: Payer: Self-pay | Admitting: Physical Therapy

## 2018-02-23 DIAGNOSIS — R262 Difficulty in walking, not elsewhere classified: Secondary | ICD-10-CM

## 2018-02-23 DIAGNOSIS — M6281 Muscle weakness (generalized): Secondary | ICD-10-CM

## 2018-02-23 DIAGNOSIS — M25552 Pain in left hip: Secondary | ICD-10-CM

## 2018-02-23 NOTE — Therapy (Signed)
[address] [place], [place], [address] Phone: [phone]   Fax:  [phone]  Physical Therapy Treatment  Patient Details  Name: Kathleen Griffith MRN: [id number] Date of Birth: 24-Feb-1941 No Data Recorded  Encounter Date: 02/23/2018  PT End of Session - 02/23/18 1721    Visit Number  10    Date for PT Re-Evaluation  02/28/18    PT Start Time  1623    PT Stop Time  1710    PT Time Calculation (min)  47 min    Activity Tolerance  Patient limited by pain    Behavior During Therapy  [facility] for tasks assessed/performed       Past Medical History:  Diagnosis Date  . Anemia    microcytic   . Arthritis   . Atrial fibrillation ([place])   . Cataract    REMOVED BILATERAL  . Colon polyps   . Diabetes mellitus ([place])   . Fibromyalgia   . Gastroparesis   . Hyperlipidemia   . Hypertension   . Kidney stones   . Melanoma ([place])   . PMR (polymyalgia rheumatica) (HCC)   . PVD (peripheral vascular disease) ([place])     Past Surgical History:  Procedure Laterality Date  . APPENDECTOMY    . BREAST BIOPSY    . CESAREAN SECTION    . COLONOSCOPY    . COLONOSCOPY WITH PROPOFOL N/A 12/26/2015   Procedure: COLONOSCOPY WITH PROPOFOL;  Surgeon: Manus Gunning, MD;  [facility];  Service: Gastroenterology;  Laterality: N/A;  . ESOPHAGOGASTRODUODENOSCOPY (EGD) WITH PROPOFOL N/A 12/26/2015   Procedure: ESOPHAGOGASTRODUODENOSCOPY (EGD) WITH PROPOFOL;  Surgeon: Manus Gunning, MD;  [facility];  Service: Gastroenterology;  Laterality: N/A;  . LEFT HEART CATH AND CORONARY ANGIOGRAPHY N/A 06/03/2017   Procedure: Left Heart Cath and Coronary Angiography;  Surgeon: Martinique, Peter M, MD;  Location: [place] [facility];  Service: Cardiovascular;  Laterality: N/A;  . POLYPECTOMY      There were no vitals filed for this visit.  Subjective Assessment - 02/23/18 1634    Subjective  Patient reports less  shin pain, she has less swelling in the lower legs and ankles today    Currently in Pain?  Yes    Pain Score  7     Pain Location  Back and legs    Pain Descriptors / Indicators  Aching;Sore    Aggravating Factors   reports difficulty sleeping                      [facility] Adult PT Treatment/Exercise - 02/23/18 0001      Knee/Hip Exercises: Stretches   Passive Hamstring Stretch  4 reps;20 seconds    Piriformis Stretch  4 reps;30 seconds    Gastroc Stretch  4 reps;20 seconds      Knee/Hip Exercises: Aerobic   Nustep  level 4 x 5 minutes      Knee/Hip Exercises: Seated   Other Seated Knee/Hip Exercises  seatd on the sit fit, pelvic mobility and stability      Knee/Hip Exercises: Supine   Other Supine Knee/Hip Exercises  feet on ball K2C, trunk rotaiton, small bridges and abdominal isometrics    Other Supine Knee/Hip Exercises  ab sets p ball 2x10      Moist Heat Therapy   Number Minutes Moist Heat  15 Minutes    Moist Heat Location  Lumbar Spine  Theme park manager  lumbar area    Chartered certified accountant  IFC    Electrical Stimulation Parameters  supine    Electrical Stimulation Goals  Pain               PT Short Term Goals - 01/12/18 1516      PT SHORT TERM GOAL #1   Title  independent with initial HEP    Status  Achieved        PT Long Term Goals - 02/19/18 1641      PT LONG TERM GOAL #2   Title  decrease pain 50%    Status  On-going      PT LONG TERM GOAL #3   Title  walk 300 feet without rest    Status  On-going      PT LONG TERM GOAL #4   Title  tolerate standing 10 minutes    Status  On-going      PT LONG TERM GOAL #5   Title  vacuum without increase of pain    Status  On-going            Plan - 02/23/18 1722    Clinical Impression Statement  Patients lower legs looked better today, they still seem red and shiny, I again spoke with her about seeing her MD about possible  cellulitis.  She reports having an appointment next week.  She is more open to doing exercises but still limited due to pain.    PT Next Visit Plan  slowly add exercises    Consulted and Agree with Plan of Care  Patient       Patient will benefit from skilled therapeutic intervention in order to improve the following deficits and impairments:  Abnormal gait, Decreased activity tolerance, Decreased balance, Decreased mobility, Decreased strength, Postural dysfunction, Improper body mechanics, Impaired flexibility, Pain, Increased muscle spasms, Decreased range of motion, Difficulty walking  Visit Diagnosis: Pain in left hip  Difficulty in walking, not elsewhere classified  Muscle weakness (generalized)     Problem List Patient Active Problem List   Diagnosis Date Noted  . Depression 12/03/2017  . Normal coronary arteries 12/03/2017  . 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 Polymyalgia rheumatica ([place]) 12/03/2017  . NSTEMI (non-ST elevated myocardial infarction) ([place]) 06/02/2017  . Fracture of rib of right side 03/20/2016  . Acute respiratory failure with hypoxia ([place]) 03/20/2016  . PAF (paroxysmal atrial fibrillation) ([place]) 03/20/2016  . Pneumothorax on right 03/20/2016  . ARDS (adult respiratory distress syndrome) ([place]) 03/20/2016  . Insulin dependent diabetes mellitus ([place]) 03/20/2016  . GERD (gastroesophageal reflux disease) 03/20/2016  . Dysphagia   . Abdominal pain, epigastric   . Gastric polyp   . Anemia, iron deficiency   . Angiodysplasia of cecum   . Colon polyp   . 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 Chronic anticoagulation 07/28/2015  . Family history of colon cancer 07/28/2015  . CN (constipation) 07/28/2015  . Paroxysmal atrial fibrillation ([place]) 04/04/2015  . Carotid artery disease ([place]) 04/04/2015  . Tobacco abuse 04/04/2015  . Essential hypertension 04/04/2015  . Hyperlipidemia 04/04/2015    Sumner Boast., PT 02/23/2018, 5:23 PM  [address] [place], [place], [address] Phone: [phone]   Fax:  [phone]  Name: Kathleen Griffith MRN: [id number] Date of Birth: Apr 25, 1941

## 2018-02-26 ENCOUNTER — Ambulatory Visit: Payer: Medicare Other | Admitting: Physical Therapy

## 2018-02-26 ENCOUNTER — Telehealth: Payer: Self-pay | Admitting: Cardiology

## 2018-02-26 ENCOUNTER — Other Ambulatory Visit (HOSPITAL_COMMUNITY): Payer: Self-pay | Admitting: Family Medicine

## 2018-02-26 DIAGNOSIS — M79606 Pain in leg, unspecified: Secondary | ICD-10-CM

## 2018-02-26 DIAGNOSIS — I10 Essential (primary) hypertension: Secondary | ICD-10-CM

## 2018-02-26 DIAGNOSIS — I214 Non-ST elevation (NSTEMI) myocardial infarction: Secondary | ICD-10-CM

## 2018-02-26 MED ORDER — METOPROLOL TARTRATE 25 MG PO TABS: 50.0000 mg | ORAL_TABLET | Freq: Two times a day (BID) | ORAL | 3 refills | Status: DC

## 2018-02-26 NOTE — Telephone Encounter (Signed)
Pt called in as her Lopressor was adjusted in January and she wants to make sure pharmacy is aware of new dose. The dose went from 25mg  BID to 50mg  BID which she has been taking as instructed. She wants to keep it at the 25mg  tablets so if medication is decrease again she does not have to cut tablets in half.  Pt stated that she has increase her Gabapentin and stopped her Crestor as instructed and the pain has gotten better except in her right leg. Pt stated that her BP is still 150/74 generally and HR in the 60's. She has not had any more episodes of A. Fib. Pt understands that BP will remain somewhat elevated as long as she is in pain.  Update Rx sent to pt preferred Pharmacy, pt verbalized understanding no additional questions at this time.

## 2018-02-26 NOTE — Telephone Encounter (Signed)
New message ° °Pt verbalized that she is calling for the RN °

## 2018-03-03 ENCOUNTER — Ambulatory Visit: Payer: Medicare Other | Admitting: Physical Therapy

## 2018-03-04 ENCOUNTER — Ambulatory Visit (HOSPITAL_COMMUNITY)
Admission: RE | Admit: 2018-03-04 | Discharge: 2018-03-04 | Disposition: A | Discharge status: Home / Self Care | Payer: Medicare Other | Source: Ambulatory Visit | Attending: Cardiovascular Disease | Admitting: Cardiovascular Disease

## 2018-03-04 DIAGNOSIS — M79606 Pain in leg, unspecified: Secondary | ICD-10-CM

## 2018-03-04 DIAGNOSIS — M79605 Pain in left leg: Secondary | ICD-10-CM | POA: Diagnosis present

## 2018-03-04 DIAGNOSIS — M79604 Pain in right leg: Secondary | ICD-10-CM | POA: Insufficient documentation

## 2018-03-05 ENCOUNTER — Ambulatory Visit: Payer: Medicare Other | Admitting: Physical Therapy

## 2018-03-10 ENCOUNTER — Telehealth: Payer: Self-pay | Admitting: Cardiology

## 2018-03-10 DIAGNOSIS — Z79899 Other long term (current) drug therapy: Secondary | ICD-10-CM

## 2018-03-10 NOTE — Telephone Encounter (Signed)
Add lasix 20 mg daily; bmet one week [facility]

## 2018-03-10 NOTE — Telephone Encounter (Signed)
Patient aware of MD recommendations. Med list updated, she has medication at home. She is aware she needs a BMET in 1 week and can come by our office for this, no appointment needed. Offered her a sooner APP appointment before her 03/27/18 MD office visit, which she declined.

## 2018-03-10 NOTE — Telephone Encounter (Signed)
New Message:   Pt says both feet are swollen,cant wear her shoes. Rt leg is swollen and real hard,so much pain in her feet and leg.

## 2018-03-10 NOTE — Telephone Encounter (Signed)
Returned call to patient of Dr. Stanford Breed who complains of bilateral leg swelling, which is not new, this has "been going on for a while", about 1 month or more. She states she took lasix 20mg  at one time, for 2 days - no changes with her weight, did not help with swelling. She reports she has gained weight, 1-2 pounds every now and then and will lose the weight. She reports feet swelling and outer aspect of her ankles. She reports her swelling is R>L with pain in right leg. She had LEAs on 03/04/18 - has not received results from PCP (in [place]). She has no SOB, but is not able to do anything as far as exerting herself.   Advised would send to MD/covering RN to review and advise. She asked for Hilda Blades, RN but notified her she is out of the office.

## 2018-03-18 LAB — BASIC METABOLIC PANEL
BUN / CREAT RATIO: 28 (ref 12–28)
BUN: 25 mg/dL (ref 8–27)
CHLORIDE: 102 mmol/L (ref 96–106)
CO2: 24 mmol/L (ref 20–29)
Calcium: 10 mg/dL (ref 8.7–10.3)
Creatinine, Ser: 0.89 mg/dL (ref 0.57–1.00)
GFR, EST AFRICAN AMERICAN: 72 mL/min/{1.73_m2} (ref >59)
GFR, EST NON AFRICAN AMERICAN: 63 mL/min/{1.73_m2} (ref >59)
Glucose: 122 mg/dL — ABNORMAL HIGH (ref 65–99)
POTASSIUM: 4.6 mmol/L (ref 3.5–5.2)
SODIUM: 144 mmol/L (ref 134–144)

## 2018-03-18 NOTE — Progress Notes (Signed)
HPI: FU atrial fibrillation. She has a history of paroxysmal atrial fibrillation. Abdominal ultrasound May 2016 showed no aneurysm. ABIs November 2016 normal. Patient admitted with non-ST elevation myocardial infarction in June 2018. Cardiac catheterization revealed a 20% proximal to mid LAD and normal left ventricular end-diastolic pressure. Echocardiogram showed ejection fraction 09-47%, grade 2 diastolic dysfunction, mild tricuspid regurgitation, severely elevated pulmonary pressure. Sludge noted LV apex. 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 Echocardiogram repeated August 2018 and showed normal LV function, grade 1 diastolic dysfunction. Carotid Dopplers August 2018 showed 40-59% left and 1-39% right stenosis.   ABIs March 2019 normal.  Since last seen,  patient describes back and hip pain.  She also has pain in her lower extremities right greater than left.  There is some pain in her calf with ambulation on the right relieved with rest.  No dyspnea, chest pain or syncope.  Current Outpatient Medications  Medication Sig Dispense Refill  . acetaminophen (TYLENOL) 650 MG CR tablet Take 1,300 mg by mouth every 8 (eight) hours as needed for pain.    . Cholecalciferol (VITAMIN D3) 1000 UNITS CAPS Take 1 tablet by mouth daily.    . DULoxetine (CYMBALTA) 30 MG capsule Take by mouth.    . furosemide (LASIX) 20 MG tablet Take 20 mg by mouth daily.    Marland Kitchen gabapentin (NEURONTIN) 300 MG capsule TK 1 C PO BID  3  . insulin glargine (LANTUS) 100 UNIT/ML injection Inject 0.15 mLs (15 Units total) into the skin at bedtime. (Patient taking differently: Inject 40 Units into the skin every morning. ) 10 mL 11  . metoprolol tartrate (LOPRESSOR) 25 MG tablet Take 2 tablets (50 mg total) by mouth 2 (two) times daily. 360 tablet 3  . Multiple Vitamins-Minerals (CENTRUM SILVER PO) Take 1 tablet by mouth daily.    . rivaroxaban (XARELTO) 20 MG TABS tablet Take 1 tablet (20 mg total) by mouth daily with  supper. 90 tablet 1   No current facility-administered medications for this visit.      Past Medical History:  Diagnosis Date  . Anemia    microcytic   . Arthritis   . Atrial fibrillation ([place])   . Cataract    REMOVED BILATERAL  . Colon polyps   . Diabetes mellitus ([place])   . Fibromyalgia   . Gastroparesis   . Hyperlipidemia   . Hypertension   . Kidney stones   . Melanoma ([place])   . PMR (polymyalgia rheumatica) ([facility])   . PVD (peripheral vascular disease) ([place])     Past Surgical History:  Procedure Laterality Date  . APPENDECTOMY    . BREAST BIOPSY    . CESAREAN SECTION    . COLONOSCOPY    . COLONOSCOPY WITH PROPOFOL N/A 12/26/2015   Procedure: COLONOSCOPY WITH PROPOFOL;  Surgeon: Manus Gunning, MD;  Location: [facility];  Service: Gastroenterology;  Laterality: N/A;  . ESOPHAGOGASTRODUODENOSCOPY (EGD) WITH PROPOFOL N/A 12/26/2015   Procedure: ESOPHAGOGASTRODUODENOSCOPY (EGD) WITH PROPOFOL;  Surgeon: Manus Gunning, MD;  Location: [facility];  Service: Gastroenterology;  Laterality: N/A;  . LEFT HEART CATH AND CORONARY ANGIOGRAPHY N/A 06/03/2017   Procedure: Left Heart Cath and Coronary Angiography;  Surgeon: Martinique, Peter M, MD;  Location: [facility];  Service: Cardiovascular;  Laterality: N/A;  . POLYPECTOMY      Social History   Socioeconomic History  . Marital status: Widowed    Spouse name: Not on file  . Number of children: 1  .  Years of education: Not on file  . Highest education level: Not on file  Occupational History  . Not on file  Social Needs  . Financial resource strain: Not on file  . Food insecurity:    Worry: Not on file    Inability: Not on file  . Transportation needs:    Medical: Not on file    Non-medical: Not on file  Tobacco Use  . Smoking status: Current Every Day Smoker    Packs/day: 1.00    Types: Cigarettes  . Smokeless tobacco: Never Used  Substance and Sexual Activity  . Alcohol use: No     Alcohol/week: 0.0 oz  . Drug use: No  . Sexual activity: Not on file  Lifestyle  . Physical activity:    Days per week: Not on file    Minutes per session: Not on file  . Stress: Not on file  Relationships  . Social connections:    Talks on phone: Not on file    Gets together: Not on file    Attends religious service: Not on file    Active member of club or organization: Not on file    Attends meetings of clubs or organizations: Not on file    Relationship status: Not on file  . Intimate partner violence:    Fear of current or ex partner: Not on file    Emotionally abused: Not on file    Physically abused: Not on file    Forced sexual activity: Not on file  Other Topics Concern  . Not on file  Social History Narrative  . Not on file    Family History  Problem Relation Age of Onset  . CAD Father        MI and CABG  . Diabetes Father   . Heart disease Father   . Colon cancer Mother 41    ROS: no fevers or chills, productive cough, hemoptysis, dysphasia, odynophagia, melena, hematochezia, dysuria, hematuria, rash, seizure activity, orthopnea, PND. Remaining systems are negative.  Physical Exam: Well-developed well-nourished in no acute distress.  Skin is warm and dry.  HEENT is normal.  Neck is supple.  Chest is clear to auscultation with normal expansion.  Cardiovascular exam is regular but bradycardic Abdominal exam nontender or distended. No masses palpated. Extremities show diminished distal pulses, tenderness to palpation, trace edema and varicosities.   Neuro grossly intact  ECG-marked sinus bradycardia at a rate of 46.  Nonspecific ST changes.  Personally reviewed  A/P  1 paroxysmal atrial fibrillation-patient remains in sinus rhythm.  Continue metoprolol for rate control if atrial fibrillation recurs but decrease dose to 25 mg twice daily given bradycardia.  Continue Xarelto.  2 history of takotsubo cardiomyopathy-LV function improved on most recent  echocardiogram.  3 hypertension-blood pressure is borderline.  I am decreasing metoprolol to 25 mg twice daily because of bradycardia.  I will therefore add amlodipine 5 mg daily for improved blood pressure control.  4 hyperlipidemia-statin held because of potential contribution to muscle pain.  We will consider resuming in the future.  5 tobacco abuse-patient counseled on discontinuing.  6 carotid artery disease-schedule follow-up carotid Dopplers August 2019.  7 peripheral vascular disease-continue statin.  She is not on aspirin given need for anticoagulation.  Patient is describing bilateral lower extremity pain.  She is also having back and hip pain and this may be related to her back.  However she has pain in her right calf with ambulation.  Schedule CTA to evaluate  lower extremity vasculature.  Kirk Ruths, MD

## 2018-03-19 ENCOUNTER — Telehealth: Payer: Self-pay | Admitting: Physical Therapy

## 2018-03-27 ENCOUNTER — Ambulatory Visit: Payer: Medicare Other | Admitting: Cardiology

## 2018-03-27 ENCOUNTER — Encounter: Payer: Self-pay | Admitting: Cardiology

## 2018-03-27 VITALS — BP 132/62 | HR 46 | Ht 63.0 in | Wt 166.0 lb

## 2018-03-27 DIAGNOSIS — I739 Peripheral vascular disease, unspecified: Secondary | ICD-10-CM | POA: Diagnosis not present

## 2018-03-27 DIAGNOSIS — I214 Non-ST elevation (NSTEMI) myocardial infarction: Secondary | ICD-10-CM | POA: Diagnosis not present

## 2018-03-27 DIAGNOSIS — I10 Essential (primary) hypertension: Secondary | ICD-10-CM | POA: Diagnosis not present

## 2018-03-27 MED ORDER — AMLODIPINE BESYLATE 5 MG PO TABS: 5.0000 mg | ORAL_TABLET | Freq: Every day | ORAL | 3 refills | Status: DC

## 2018-03-27 MED ORDER — METOPROLOL TARTRATE 25 MG PO TABS: 25.0000 mg | ORAL_TABLET | Freq: Two times a day (BID) | ORAL | 3 refills | Status: DC

## 2018-03-27 NOTE — Patient Instructions (Signed)
Medication Instructions:   DECREASE METOPROLOL TO 25 MG TWICE DAILY= 1 OF THE 25 MG TABLETS TWICE DAILY  START AMLODIPINE 5 MG ONCE DAILY  Testing/Procedures:  CTA OF THE ABDOMEN/ PELVIS AND LOWER EXT FOR CLAUDICATION AT THE [address] LOCATION  Follow-Up:  Your physician recommends that you schedule a follow-up appointment in: [place]   If you need a refill on your cardiac medications before your next appointment, please call your pharmacy.

## 2018-04-08 ENCOUNTER — Ambulatory Visit (INDEPENDENT_AMBULATORY_CARE_PROVIDER_SITE_OTHER)
Admission: RE | Admit: 2018-04-08 | Discharge: 2018-04-08 | Disposition: A | Discharge status: Home / Self Care | Payer: Medicare Other | Source: Ambulatory Visit | Attending: Cardiology | Admitting: Cardiology

## 2018-04-08 DIAGNOSIS — I739 Peripheral vascular disease, unspecified: Secondary | ICD-10-CM | POA: Diagnosis not present

## 2018-04-08 MED ORDER — IOPAMIDOL (ISOVUE-300) INJECTION 61%
125.0000 mL | Freq: Once | INTRAVENOUS | Status: AC | PRN
  Administered 2018-04-08: 125 mL via INTRAVENOUS

## 2018-04-09 ENCOUNTER — Telehealth: Payer: Self-pay | Admitting: Cardiology

## 2018-04-09 NOTE — Telephone Encounter (Signed)
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  information  Routed Dr Stanford Breed

## 2018-04-12 IMAGING — CR DG CHEST 2V
2 series · 2 of 2 positions shown · non-contrast
Comparison: 06/02/2017

CLINICAL DATA: Follow-up congestive failure, shortness of breath
for several weeks

EXAM:
CHEST  2 VIEW

[chest lat]
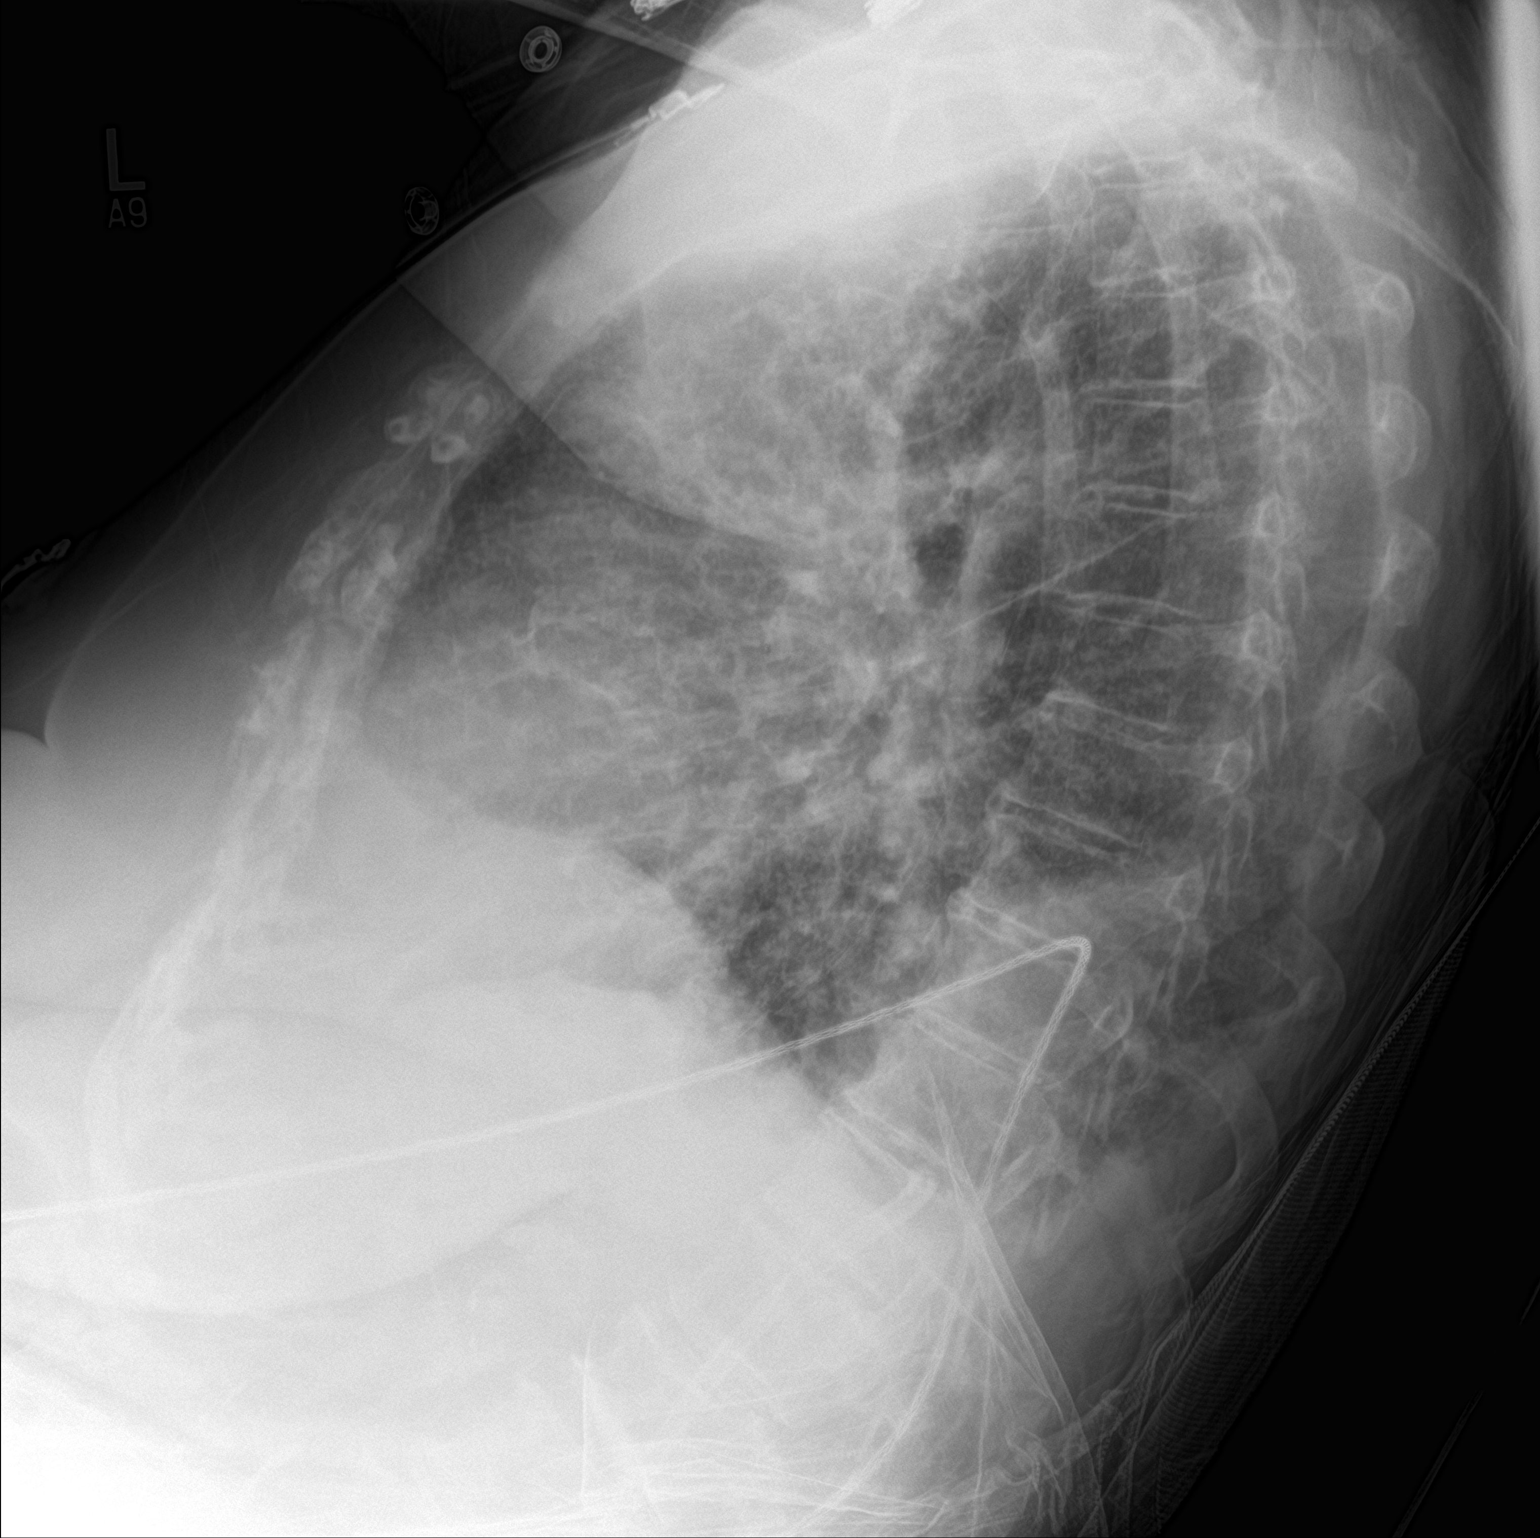

[chest ap]
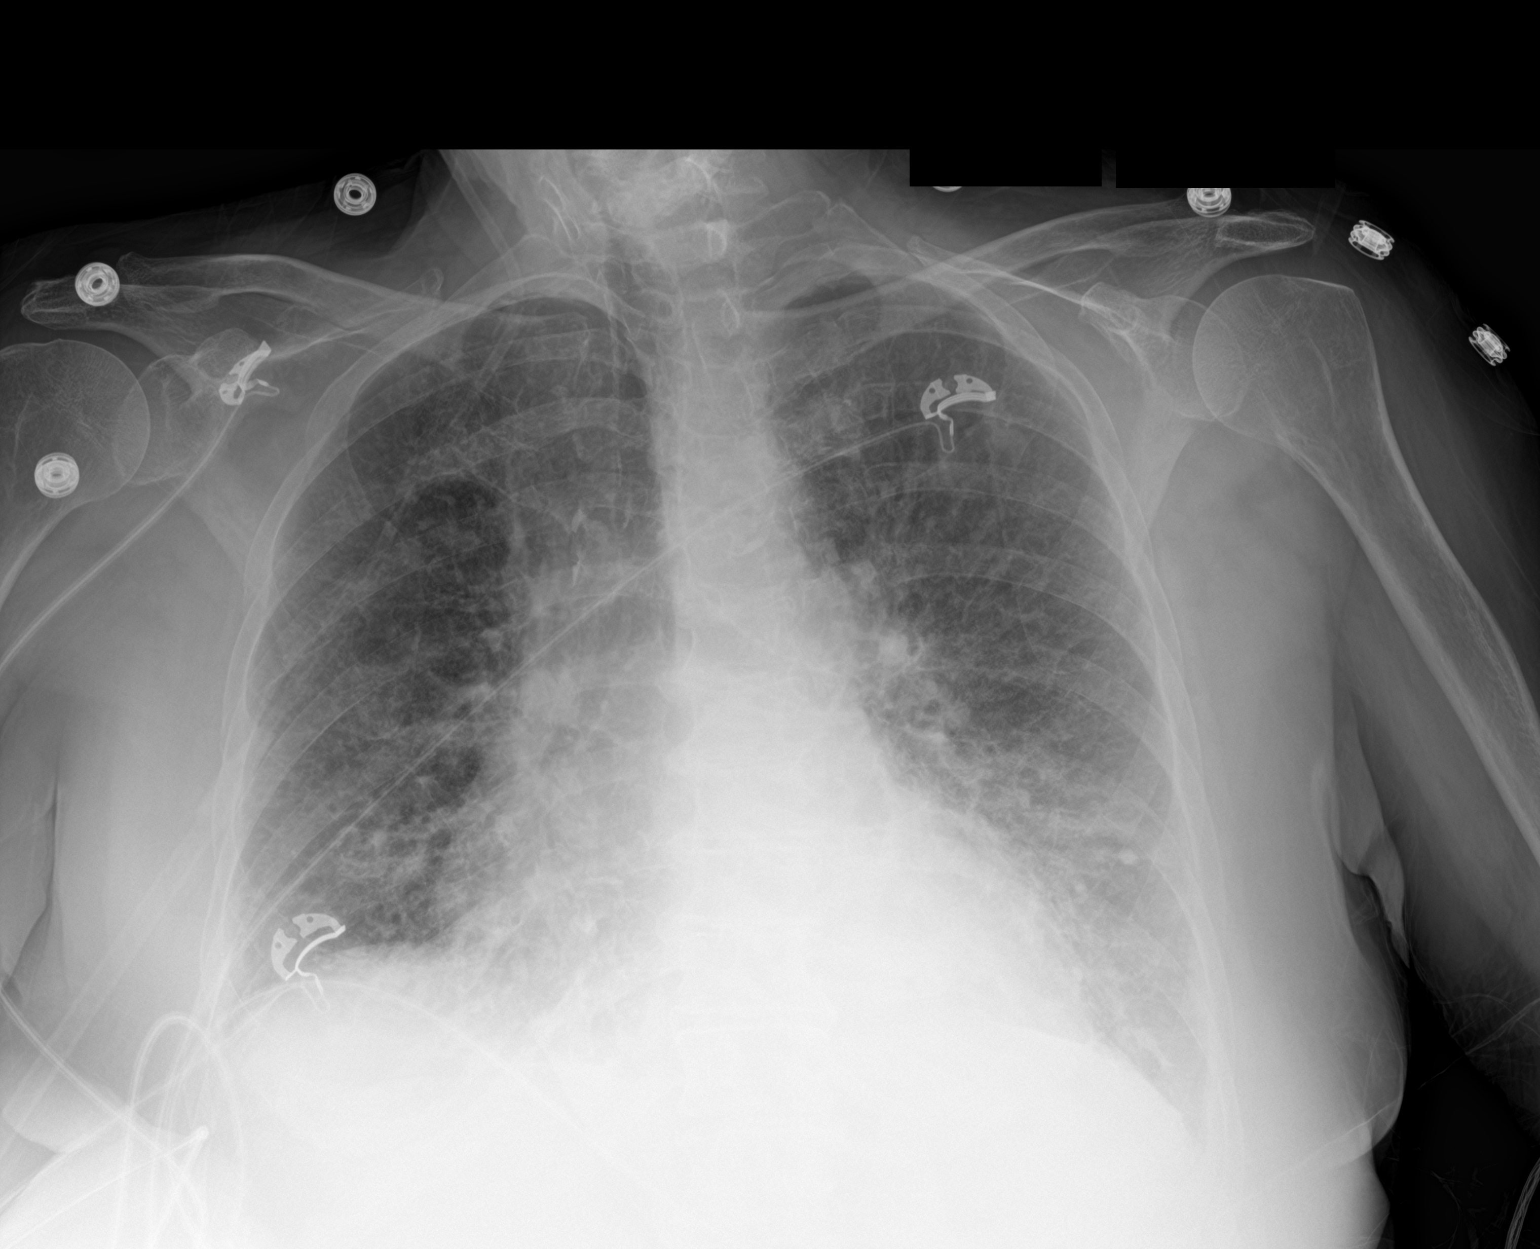

[2 of 2 positions shown; findings below may reference images not displayed]

FINDINGS: Cardiac shadow is again enlarged. Vascular congestion is again noted
and stable. Improving aeration in the left base is noted although
some persistent infiltrate seen. No new focal abnormality is noted.
IMPRESSION: Improved infiltrate in the left base.

Persisting congestive changes are noted.

## 2018-04-14 NOTE — Telephone Encounter (Signed)
Spoke with pt, aware of CT results and need for follow up in 6 months.

## 2018-04-14 NOTE — Telephone Encounter (Signed)
Follow Up:    Pt calling to see if she can have the results from her CT Scan on 04-08-18 please.

## 2018-05-07 ENCOUNTER — Other Ambulatory Visit: Payer: Self-pay | Admitting: Cardiology

## 2018-05-29 ENCOUNTER — Telehealth: Payer: Self-pay | Admitting: Cardiology

## 2018-05-29 NOTE — Telephone Encounter (Signed)
Pt states she is swelling but her weight has only went up 2#. She is not having any SOB, CP or pressure, she states that she will take an extra half today and cb Monday or any sx develop

## 2018-05-29 NOTE — Telephone Encounter (Signed)
Pt c/o swelling: STAT is pt has developed SOB within 24 hours  1) How much weight have you gained and in what time span? 5lb last month  2) If swelling, where is the swelling located?Both Legs  3) Are you currently taking a fluid pill? Lasix 20mg   4) Are you currently SOB? Only with excertion   5) Do you have a log of your daily weights (if so, list)? yes  6) Have you gained 3 pounds in a day or 5 pounds in a week? No  7) Have you traveled recently? No

## 2018-06-01 ENCOUNTER — Telehealth: Payer: Self-pay | Admitting: Cardiology

## 2018-06-01 NOTE — Telephone Encounter (Signed)
Pt calling  Pt c/o medication issue: 1. Name of Medication: Norvasc   2. How are you currently taking this medication (dosage and times per day)? 5mg  daily  3. Are you having a reaction (difficulty breathing--STAT)?  No  4. What is your medication issue? Retaining fluid and headaches

## 2018-06-01 NOTE — Telephone Encounter (Signed)
Spoke with pt, she is having in her legs. They will be down in the morning when she gets up but by the end of the day are swollen. She is gaining weight and there has not really been a change in her bp since starting the amlodipine. Her bp is still running 140-150/80's. She denies SOB or orthopnea. She reports a daily headache and her legs are sore to the touch. She is wearing compression hose and elevating her legs and that does help some. She also reports an episode of atrial fib last night that lasted about 6 hours. Will forward for dr Stanford Breed review

## 2018-06-02 MED ORDER — HYDRALAZINE HCL 25 MG PO TABS: 25.0000 mg | ORAL_TABLET | Freq: Three times a day (TID) | ORAL | 3 refills | Status: DC

## 2018-06-02 NOTE — Telephone Encounter (Signed)
Patient aware and verbalized understanding.   She will monitor BP at home and call with any significant change. rx sent to pharmacy

## 2018-06-02 NOTE — Telephone Encounter (Signed)
Dc amlodipine, hydralazine 25 mg tid Kirk Ruths

## 2018-06-04 ENCOUNTER — Telehealth: Payer: Self-pay | Admitting: Cardiology

## 2018-06-04 NOTE — Telephone Encounter (Signed)
New Message:       Pt c/o medication issue:  1. Name of Medication: hydrALAZINE (APRESOLINE) 25 MG tablet  2. How are you currently taking this medication (dosage and times per day)? Take 1 tablet (25 mg total) by mouth 3 (three) times daily.  3. Are you having a reaction (difficulty breathing--STAT)? No  4. What is your medication issue? Pt is wanting to know if she should still continue to take this medication the way the dosage says or take less to have lower BP readings

## 2018-06-04 NOTE — Telephone Encounter (Signed)
Spoke with patient and she started the Hydralazine 25 mg yesterday. Her blood pressure readings yesterday were 138/79 and 126/70. This morning her blood pressure was 124/76 and at 4:05pm it was 105/70 HR 60. She was concerned about taking the 3 doses daily. Discussed with Alena Bills D and if patient feels fine with that reading ok to take as directed. Spoke with patient and she is feeling fine. Advised patient to take the 3 doses as prescribed and continue to monitor, verbalized understanding.

## 2018-06-06 ENCOUNTER — Other Ambulatory Visit: Payer: Self-pay | Admitting: Cardiology

## 2018-06-09 ENCOUNTER — Telehealth: Payer: Self-pay | Admitting: Cardiology

## 2018-06-09 ENCOUNTER — Other Ambulatory Visit: Payer: Self-pay | Admitting: Cardiology

## 2018-06-09 MED ORDER — NITROGLYCERIN 0.3 MG SL SUBL: 0.3000 mg | SUBLINGUAL_TABLET | SUBLINGUAL | 1 refills | Status: DC | PRN

## 2018-06-09 NOTE — Telephone Encounter (Signed)
Rx sent to the pharmacy; message left on patients machine.

## 2018-06-09 NOTE — Telephone Encounter (Addendum)
Returned call to patient. She takes NTG for AFib and jaw pain. Per Hilda Blades, RN this is OK. NTG was refilled earlier today by CMA. She is NOT having chest pain

## 2018-06-09 NOTE — Telephone Encounter (Signed)
Left message for pt to call, pharmacy is requesting NTG script for the patient, ? Why she has never had that script before.

## 2018-06-09 NOTE — Telephone Encounter (Signed)
New Message    *STAT* If patient is at the pharmacy, call can be transferred to refill team.   1. Which medications need to be refilled? (please list name of each medication and dose if known) nitroGLYCERIN (NITROSTAT) 0.3 MG SL tablet  2. Which pharmacy/location (including street and city if local pharmacy) is medication to be sent to? [id number] - [place], [address] AT [address]  3. Do they need a 30 day or 90 day supply? Would like more than one refill

## 2018-06-09 NOTE — Telephone Encounter (Signed)
New Message:       Pt is calling back for results

## 2018-06-09 NOTE — Telephone Encounter (Signed)
New Message   Pt is returning call for Kathleen Griffith, pt states she is doing much better

## 2018-06-19 ENCOUNTER — Other Ambulatory Visit: Payer: Self-pay

## 2018-06-19 MED ORDER — FUROSEMIDE 20 MG PO TABS: 20.0000 mg | ORAL_TABLET | Freq: Every day | ORAL | 1 refills | Status: DC

## 2018-06-24 NOTE — Progress Notes (Signed)
HPI: FU atrial fibrillation. She has a history of paroxysmal atrial fibrillation. Abdominal ultrasound May 2016 showed no aneurysm. ABIs November 2016 normal. Patient admitted with non-ST elevation myocardial infarction in June 2018. Cardiac catheterization revealed a 20% proximal to mid LAD and normal left ventricular end-diastolic pressure. Echocardiogram showed ejection fraction 96-28%, grade 2 diastolic dysfunction, mild tricuspid regurgitation, severely elevated pulmonary pressure. Sludge noted LV apex. Findings were felt suspicious for takotsubo CM.Echocardiogram repeated August 2018 and showed normal LV function, grade 1 diastolic dysfunction.Carotid Dopplers August 2018 showed 40-59% left and 1-39% right stenosis.  ABIs March 2019 normal.    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  There was note of 0.9 cm lesion in the left kidney and further evaluation with renal protocol MRI suggested in 6 months.  Since last seen, the patient has dyspnea with more extreme activities but not with routine activities. It is relieved with rest. It is not associated with chest pain. There is no orthopnea, PND or pedal edema. There is no syncope or palpitations. There is no exertional chest pain.  Leg pain has improved.    Current Outpatient Medications  Medication Sig Dispense Refill  . acetaminophen (TYLENOL) 650 MG CR tablet Take 1,300 mg by mouth every 8 (eight) hours as needed for pain.    . Cholecalciferol (VITAMIN D3) 1000 UNITS CAPS Take 1 tablet by mouth daily.    . DULoxetine (CYMBALTA) 30 MG capsule Take by mouth.    . furosemide (LASIX) 20 MG tablet Take 1 tablet (20 mg total) by mouth daily. 90 tablet 1  . hydrALAZINE (APRESOLINE) 25 MG tablet Take 1 tablet (25 mg total) by mouth 3 (three) times daily. 90 tablet 3  . insulin glargine (LANTUS) 100 UNIT/ML  injection Inject 0.15 mLs (15 Units total) into the skin at bedtime. (Patient taking differently: Inject 40 Units into the skin every morning. ) 10 mL 11  . metoprolol tartrate (LOPRESSOR) 25 MG tablet Take 1 tablet (25 mg total) by mouth 2 (two) times daily. 180 tablet 3  . Multiple Vitamins-Minerals (CENTRUM SILVER PO) Take 1 tablet by mouth daily.    . nitroGLYCERIN (NITROSTAT) 0.3 MG SL tablet Place 1 tablet (0.3 mg total) under the tongue every 5 (five) minutes as needed for chest pain. 100 tablet 1  . pravastatin (PRAVACHOL) 10 MG tablet Take 10 mg by mouth daily.    Alveda Reasons 20 MG TABS tablet TAKE 1 TABLET(20 MG) BY MOUTH DAILY WITH SUPPER 90 tablet 0   No current facility-administered medications for this visit.      Past Medical History:  Diagnosis Date  . Anemia    microcytic   . Arthritis   . Atrial fibrillation ([place])   . Cataract    REMOVED BILATERAL  . Colon polyps   . Diabetes mellitus ([place])   . Fibromyalgia   . Gastroparesis   . Hyperlipidemia   . Hypertension   . Kidney stones   . Melanoma ([place])   . PMR (polymyalgia rheumatica) ([facility])   . PVD (peripheral vascular disease) ([place])     Past Surgical History:  Procedure Laterality Date  . APPENDECTOMY    . BREAST BIOPSY    . CESAREAN SECTION    . COLONOSCOPY    . COLONOSCOPY WITH PROPOFOL N/A 12/26/2015   Procedure: COLONOSCOPY WITH PROPOFOL;  Surgeon: Manus Gunning, MD;  Location: [facility];  Service: Gastroenterology;  Laterality: N/A;  . ESOPHAGOGASTRODUODENOSCOPY (EGD) WITH PROPOFOL N/A 12/26/2015   Procedure: ESOPHAGOGASTRODUODENOSCOPY (EGD) WITH PROPOFOL;  Surgeon: Manus Gunning, MD;  [facility]: [facility];  Service: Gastroenterology;  Laterality: N/A;  . LEFT HEART CATH AND CORONARY ANGIOGRAPHY N/A 06/03/2017   Procedure: Left Heart Cath and Coronary Angiography;  Surgeon: Martinique, Peter M, MD;  Location: [facility];  Service: Cardiovascular;  Laterality: N/A;  . POLYPECTOMY       Social History   Socioeconomic History  . Marital status: Widowed    Spouse name: Not on file  . Number of children: 1  . Years of education: Not on file  . Highest education level: Not on file  Occupational History  . Not on file  Social Needs  . Financial resource strain: Not on file  . Food insecurity:    Worry: Not on file    Inability: Not on file  . Transportation needs:    Medical: Not on file    Non-medical: Not on file  Tobacco Use  . Smoking status: Current Every Day Smoker    Packs/day: 1.00    Types: Cigarettes  . Smokeless tobacco: Never Used  Substance and Sexual Activity  . Alcohol use: No    Alcohol/week: 0.0 oz  . Drug use: No  . Sexual activity: Not on file  Lifestyle  . Physical activity:    Days per week: Not on file    Minutes per session: Not on file  . Stress: Not on file  Relationships  . Social connections:    Talks on phone: Not on file    Gets together: Not on file    Attends religious service: Not on file    Active member of club or organization: Not on file    Attends meetings of clubs or organizations: Not on file    Relationship status: Not on file  . Intimate partner violence:    Fear of current or ex partner: Not on file    Emotionally abused: Not on file    Physically abused: Not on file    Forced sexual activity: Not on file  Other Topics Concern  . Not on file  Social History Narrative  . Not on file    Family History  Problem Relation Age of Onset  . CAD Father        MI and CABG  . Diabetes Father   . Heart disease Father   . Colon cancer Mother 37    ROS: no fevers or chills, productive cough, hemoptysis, dysphasia, odynophagia, melena, hematochezia, dysuria, hematuria, rash, seizure activity, orthopnea, PND, pedal edema, claudication. Remaining systems are negative.  Physical Exam: Well-developed well-nourished in no acute distress.  Skin is warm and dry.  HEENT is normal.  Neck is supple.  Chest is  clear to auscultation with normal expansion.  Cardiovascular exam is regular rate and rhythm.  2/6 systolic murmur Abdominal exam nontender or distended. No masses palpated. Extremities show trace edema. neuro grossly intact   A/P  1 paroxysmal atrial fibrillation-patient is in sinus rhythm on examination today.  Continue metoprolol for rate control if atrial fibrillation recurs.  Continue Xarelto.  2 hypertension-blood pressure is controlled.  Continue present medications.  3 history of takotsubo cardiomyopathy-LV function has improved on most recent echocardiogram.  4 hyperlipidemia-patient had some muscle pain statin previously.  Continue present dose of pravastatin.  5 tobacco abuse-patient counseled on discontinuing.  6 carotid artery disease-plan follow-up carotid  Dopplers August 2019.  7 peripheral vascular disease--CTA did not show significant stenosis.  ABIs were normal.  8 renal mass-for renal protocol MRI October 2019.  Kirk Ruths, MD

## 2018-07-03 ENCOUNTER — Ambulatory Visit (INDEPENDENT_AMBULATORY_CARE_PROVIDER_SITE_OTHER): Payer: Medicare Other | Admitting: Cardiology

## 2018-07-03 ENCOUNTER — Encounter: Payer: Self-pay | Admitting: Cardiology

## 2018-07-03 VITALS — BP 130/60 | HR 65 | Ht 63.0 in | Wt 166.0 lb

## 2018-07-03 DIAGNOSIS — I10 Essential (primary) hypertension: Secondary | ICD-10-CM

## 2018-07-03 DIAGNOSIS — I6523 Occlusion and stenosis of bilateral carotid arteries: Secondary | ICD-10-CM

## 2018-07-03 DIAGNOSIS — I480 Paroxysmal atrial fibrillation: Secondary | ICD-10-CM

## 2018-07-03 DIAGNOSIS — E7800 Pure hypercholesterolemia, unspecified: Secondary | ICD-10-CM | POA: Diagnosis not present

## 2018-07-03 DIAGNOSIS — M79606 Pain in leg, unspecified: Secondary | ICD-10-CM

## 2018-07-03 NOTE — Patient Instructions (Addendum)
   Your physician has requested that you have a carotid duplex. This test is an ultrasound of the carotid arteries in your neck. It looks at blood flow through these arteries that supply the brain with blood. Allow one hour for this exam. There are no restrictions or special instructions.SCHEDULE IN [place]   Your physician wants you to follow-up in: [place] will receive a reminder letter in the mail two months in advance. If you don't receive a letter, please call our office to schedule the follow-up appointment.   If you need a refill on your cardiac medications before your next appointment, please call your pharmacy.

## 2018-07-13 ENCOUNTER — Telehealth: Payer: Self-pay | Admitting: Cardiology

## 2018-07-13 MED ORDER — HYDRALAZINE HCL 50 MG PO TABS: 50.0000 mg | ORAL_TABLET | Freq: Three times a day (TID) | ORAL | 11 refills | Status: DC

## 2018-07-13 NOTE — Telephone Encounter (Signed)
Spoke with pt, Aware of dr crenshaw's recommendations. New script sent to the pharmacy  

## 2018-07-13 NOTE — Telephone Encounter (Signed)
Spoke with pt, she has noticed an elevated bp first thing in the mornings, 159/81-172/89. Yesterday she took 50 mg hydralazine in the am, skipped the noon dose and 25 mg in the evening. Her bp this morning was 152/70. She has an appointment with her medical to discuss her anxiety. Will forward for dr Stanford Breed review

## 2018-07-13 NOTE — Telephone Encounter (Signed)
Change hydralazine to 50 mg tid and follow BP Kirk Ruths

## 2018-07-13 NOTE — Telephone Encounter (Signed)
New Message   Pt c/o BP issue: STAT if pt c/o blurred vision, one-sided weakness or slurred speech  1. What are your last 5 BP readings? 159/81 172/89   2. Are you having any other symptoms (ex. Dizziness, headache, blurred vision, passed out)? no  3. What is your BP issue? Pt states her blood pressures is unusually high and she take Hydralazine

## 2018-08-10 ENCOUNTER — Ambulatory Visit (HOSPITAL_COMMUNITY): Admission: RE | Admit: 2018-08-10 | Payer: Medicare Other | Source: Ambulatory Visit

## 2018-08-15 ENCOUNTER — Emergency Department (HOSPITAL_COMMUNITY)
Admission: EM | Admit: 2018-08-15 | Discharge: 2018-08-16 | Disposition: A | Discharge status: Home / Self Care | Payer: Medicare Other | Attending: Emergency Medicine | Admitting: Emergency Medicine

## 2018-08-15 ENCOUNTER — Emergency Department (HOSPITAL_COMMUNITY): Payer: Medicare Other

## 2018-08-15 ENCOUNTER — Encounter (HOSPITAL_COMMUNITY): Payer: Self-pay

## 2018-08-15 DIAGNOSIS — Z79899 Other long term (current) drug therapy: Secondary | ICD-10-CM | POA: Diagnosis not present

## 2018-08-15 DIAGNOSIS — F17210 Nicotine dependence, cigarettes, uncomplicated: Secondary | ICD-10-CM | POA: Diagnosis not present

## 2018-08-15 DIAGNOSIS — Z794 Long term (current) use of insulin: Secondary | ICD-10-CM | POA: Insufficient documentation

## 2018-08-15 DIAGNOSIS — I110 Hypertensive heart disease with heart failure: Secondary | ICD-10-CM | POA: Diagnosis not present

## 2018-08-15 DIAGNOSIS — E119 Type 2 diabetes mellitus without complications: Secondary | ICD-10-CM | POA: Insufficient documentation

## 2018-08-15 DIAGNOSIS — I509 Heart failure, unspecified: Secondary | ICD-10-CM | POA: Insufficient documentation

## 2018-08-15 DIAGNOSIS — R0602 Shortness of breath: Secondary | ICD-10-CM | POA: Diagnosis present

## 2018-08-15 LAB — BASIC METABOLIC PANEL
Anion gap: 8 (ref 5–15)
BUN: 18 mg/dL (ref 8–23)
CHLORIDE: 102 mmol/L (ref 98–111)
CO2: 29 mmol/L (ref 22–32)
CREATININE: 0.93 mg/dL (ref 0.44–1.00)
Calcium: 9.1 mg/dL (ref 8.9–10.3)
GFR calc non Af Amer: 58 mL/min — ABNORMAL LOW (ref >60)
Glucose, Bld: 73 mg/dL (ref 70–99)
POTASSIUM: 3.6 mmol/L (ref 3.5–5.1)
SODIUM: 139 mmol/L (ref 135–145)

## 2018-08-15 LAB — CBC WITH DIFFERENTIAL/PLATELET
Abs Immature Granulocytes: 0 10*3/uL (ref 0.0–0.1)
Basophils Absolute: 0.2 10*3/uL — ABNORMAL HIGH (ref 0.0–0.1)
Basophils Relative: 2 %
EOS ABS: 0.5 10*3/uL (ref 0.0–0.7)
Eosinophils Relative: 5 %
HEMATOCRIT: 47 % — AB (ref 36.0–46.0)
Hemoglobin: 15.1 g/dL — ABNORMAL HIGH (ref 12.0–15.0)
IMMATURE GRANULOCYTES: 0 %
LYMPHS ABS: 2.4 10*3/uL (ref 0.7–4.0)
Lymphocytes Relative: 23 %
MCH: 30.9 pg (ref 26.0–34.0)
MCHC: 32.1 g/dL (ref 30.0–36.0)
MCV: 96.3 fL (ref 78.0–100.0)
Monocytes Absolute: 0.9 10*3/uL (ref 0.1–1.0)
Monocytes Relative: 8 %
NEUTROS PCT: 62 %
Neutro Abs: 6.4 10*3/uL (ref 1.7–7.7)
PLATELETS: 303 10*3/uL (ref 150–400)
RBC: 4.88 MIL/uL (ref 3.87–5.11)
RDW: 14 % (ref 11.5–15.5)
WBC: 10.3 10*3/uL (ref 4.0–10.5)

## 2018-08-15 LAB — BRAIN NATRIURETIC PEPTIDE: B NATRIURETIC PEPTIDE 5: 274.9 pg/mL — AB (ref 0.0–100.0)

## 2018-08-15 LAB — I-STAT TROPONIN, ED: TROPONIN I, POC: 0.03 ng/mL (ref 0.00–0.08)

## 2018-08-15 MED ORDER — FUROSEMIDE 10 MG/ML IJ SOLN
20.0000 mg | Freq: Once | INTRAMUSCULAR | Status: AC
  Administered 2018-08-15: 20 mg via INTRAVENOUS
  Filled 2018-08-15: qty 2

## 2018-08-15 NOTE — ED Provider Notes (Signed)
[place] EMERGENCY DEPARTMENT Provider Note   CSN: [id number] Arrival date & time: 08/15/18  1937     History   Chief Complaint Chief Complaint  Patient presents with  . Shortness of Breath    HPI Kathleen Griffith is a 77 y.o. female presenting for 3 days of shortness of breath and dyspnea on exertion.  Patient brought in by EMS, EMS noted that patient with O2 saturation of 95% on room air, she was placed on 2 L per nasal cannula.  Patient states that she has felt like it has been difficult to take a full breath for the past 3 days.  Patient denies similar symptoms in the past, she denies history of COPD or asthma.  Patient states that she is a current everyday smoker, 3/4 pack/day.    Patient with history of MI and stent placement with normal left heart cath on 06/03/2017.  Patient denies fevers, chest pain, nausea/vomiting, diarrhea, abdominal pain.  Patient on Eliquis for history of atrial fibrillation, patient states that she has been taking her Eliquis daily and has not missed any doses.  HPI  Past Medical History:  Diagnosis Date  . Anemia    microcytic   . Arthritis   . Atrial fibrillation ([place])   . Cataract    REMOVED BILATERAL  . Colon polyps   . Diabetes mellitus ([place])   . Fibromyalgia   . Gastroparesis   . Hyperlipidemia   . Hypertension   . Kidney stones   . Melanoma ([place])   . PMR (polymyalgia rheumatica) (HCC)   . PVD (peripheral vascular disease) [facility])     Patient Active Problem List   Diagnosis Date Noted  . Depression 12/03/2017  . Normal coronary arteries 12/03/2017  . 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 Polymyalgia rheumatica ([place]) 12/03/2017  . NSTEMI (non-ST elevated myocardial infarction) ([place]) 06/02/2017  . Fracture of rib of right side 03/20/2016  . Acute respiratory failure with hypoxia ([place]) 03/20/2016  . PAF (paroxysmal atrial fibrillation) ([place]) 03/20/2016  . Pneumothorax on right 03/20/2016  . ARDS (adult respiratory  distress syndrome) ([place]) 03/20/2016  . Insulin dependent diabetes mellitus ([place]) 03/20/2016  . GERD (gastroesophageal reflux disease) 03/20/2016  . Dysphagia   . Abdominal pain, epigastric   . Gastric polyp   . Anemia, iron deficiency   . Angiodysplasia of cecum   . Colon polyp   . 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 Chronic anticoagulation 07/28/2015  . Family history of colon cancer 07/28/2015  . CN (constipation) 07/28/2015  . Paroxysmal atrial fibrillation ([place]) 04/04/2015  . Carotid artery disease ([place]) 04/04/2015  . Tobacco abuse 04/04/2015  . Essential hypertension 04/04/2015  . Hyperlipidemia 04/04/2015    Past Surgical History:  Procedure Laterality Date  . APPENDECTOMY    . BREAST BIOPSY    . CESAREAN SECTION    . COLONOSCOPY    . COLONOSCOPY WITH PROPOFOL N/A 12/26/2015   Procedure: COLONOSCOPY WITH PROPOFOL;  Surgeon: Manus Gunning, MD;  Location: [facility];  Service: Gastroenterology;  Laterality: N/A;  . ESOPHAGOGASTRODUODENOSCOPY (EGD) WITH PROPOFOL N/A 12/26/2015   Procedure: ESOPHAGOGASTRODUODENOSCOPY (EGD) WITH PROPOFOL;  Surgeon: Manus Gunning, MD;  Location: [facility];  Service: Gastroenterology;  Laterality: N/A;  . LEFT HEART CATH AND CORONARY ANGIOGRAPHY N/A 06/03/2017   Procedure: Left Heart Cath and Coronary Angiography;  Surgeon: Martinique, Peter M, MD;  Location: [facility] CV LAB;  Service: Cardiovascular;  Laterality: N/A;  . POLYPECTOMY       OB History  None      Home Medications    Prior to Admission medications   Medication Sig Start Date End Date Taking? Authorizing Provider  acetaminophen (TYLENOL) 650 MG CR tablet Take 1,300 mg by mouth every 8 (eight) hours as needed for pain.   Yes [provider]  Cholecalciferol (VITAMIN D3) 1000 UNITS CAPS Take 1 tablet by mouth daily.   Yes [provider]  DULoxetine (CYMBALTA) 30 MG capsule Take 30 mg by mouth daily.    Yes [provider]    furosemide (LASIX) 20 MG tablet Take 1 tablet (20 mg total) by mouth daily. 06/19/18  Yes Lelon Perla, MD  hydrALAZINE (APRESOLINE) 50 MG tablet Take 1 tablet (50 mg total) by mouth 3 (three) times daily. 07/13/18 10/11/18 Yes Lelon Perla, MD  insulin glargine (LANTUS) 100 UNIT/ML injection Inject 0.15 mLs (15 Units total) into the skin at bedtime. Patient taking differently: Inject 40 Units into the skin every morning.  06/06/17  Yes [name], MD  LORazepam (ATIVAN) 1 MG tablet Take 1 mg by mouth every 6 (six) hours as needed for anxiety.   Yes [provider]  metoprolol tartrate (LOPRESSOR) 25 MG tablet Take 1 tablet (25 mg total) by mouth 2 (two) times daily. 03/27/18  Yes Lelon Perla, MD  Multiple Vitamins-Minerals (CENTRUM SILVER PO) Take 1 tablet by mouth daily.   Yes [provider]  nitroGLYCERIN (NITROSTAT) 0.3 MG SL tablet Place 1 tablet (0.3 mg total) under the tongue every 5 (five) minutes as needed for chest pain. 06/09/18  Yes Lelon Perla, MD  pravastatin (PRAVACHOL) 10 MG tablet Take 10 mg by mouth every evening.    Yes [provider]  XARELTO 20 MG TABS tablet TAKE 1 TABLET(20 MG) BY MOUTH DAILY WITH SUPPER 05/08/18  Yes Crenshaw, Denice Bors, MD    Family History Family History  Problem Relation Age of Onset  . CAD Father        MI and CABG  . Diabetes Father   . Heart disease Father   . Colon cancer Mother 21    Social History Social History   Tobacco Use  . Smoking status: Current Every Day Smoker    Packs/day: 1.00    Types: Cigarettes  . Smokeless tobacco: Never Used  Substance Use Topics  . Alcohol use: No    Alcohol/week: 0.0 standard drinks  . Drug use: No     Allergies   Losartan; Pregabalin; Metoclopramide; Lisinopril; Metformin; and Metformin and related   Review of Systems Review of Systems  Constitutional: Negative.  Negative for chills and fever.  HENT: Negative.  Negative for rhinorrhea  and sore throat.   Eyes: Negative.  Negative for visual disturbance.  Respiratory: Positive for shortness of breath. Negative for cough.   Cardiovascular: Negative.  Negative for chest pain and leg swelling.  Gastrointestinal: Negative.  Negative for abdominal pain, blood in stool, diarrhea, nausea and vomiting.  Genitourinary: Negative.  Negative for dysuria and hematuria.  Musculoskeletal: Negative.  Negative for arthralgias and myalgias.  Skin: Negative.  Negative for rash.  Neurological: Negative.  Negative for dizziness, weakness and headaches.    Physical Exam Updated Vital Signs BP (!) 106/51   Pulse (!) 53   Temp 98.5 F (36.9 C) (Oral)   Resp 16   Ht 5\' 2"  (1.575 m)   Wt 72.6 kg   SpO2 98%   BMI 29.26 kg/m   Physical Exam  Constitutional: She is  oriented to person, place, and time. She appears well-developed and well-nourished. No distress.  HENT:  Head: Normocephalic and atraumatic.  Right Ear: External ear normal.  Left Ear: External ear normal.  Nose: Nose normal.  Eyes: Pupils are equal, round, and reactive to light. EOM are normal.  Neck: Trachea normal and normal range of motion. No tracheal deviation present.  Cardiovascular: Normal rate, regular rhythm, normal heart sounds and intact distal pulses.  Pulmonary/Chest: Effort normal. No respiratory distress. She exhibits no mass and no tenderness.  Abdominal: Soft. There is no tenderness. There is no rebound and no guarding.  Musculoskeletal: Normal range of motion.  Neurological: She is alert and oriented to person, place, and time.  Skin: Skin is warm and dry.  Psychiatric: She has a normal mood and affect. Her behavior is normal.     ED Treatments / Results  Labs (all labs ordered are listed, but only abnormal results are displayed) Labs Reviewed  BASIC METABOLIC PANEL - Abnormal; Notable for the following components:      Result Value   GFR calc non Af Amer 58 (*)    All other components within  normal limits  CBC WITH DIFFERENTIAL/PLATELET - Abnormal; Notable for the following components:   Hemoglobin 15.1 (*)    HCT 47.0 (*)    Basophils Absolute 0.2 (*)    All other components within normal limits  BRAIN NATRIURETIC PEPTIDE - Abnormal; Notable for the following components:   B Natriuretic Peptide 274.9 (*)    All other components within normal limits  I-STAT TROPONIN, ED    EKG EKG Interpretation  Date/Time:  Saturday August 15 2018 19:38:04 EDT Ventricular Rate:  78 PR Interval:    QRS Duration: 97 QT Interval:  377 QTC Calculation: 430 R Axis:   56 Text Interpretation:  Normal sinus rhythm Atrial premature complexes Confirmed by Pattricia Boss [phone]) on 08/15/2018 11:27:55 PM   Radiology Dg Chest 2 View  Result Date: 08/15/2018 CLINICAL DATA:  Acute shortness of breath for 3 days. EXAM: CHEST - 2 VIEW COMPARISON:  06/06/2017 chest radiograph FINDINGS: The cardiomediastinal silhouette is unremarkable. Pulmonary vascular congestion noted. Mild interstitial opacities are noted. No pleural effusion or pneumothorax. No acute bony abnormalities noted. IMPRESSION: Pulmonary vascular congestion with possible mild interstitial edema. Electronically Signed   By: Margarette Canada M.D.   On: 08/15/2018 20:29    Procedures Procedures (including critical care time)  Medications Ordered in ED Medications  furosemide (LASIX) injection 20 mg (20 mg Intravenous Given 08/15/18 2224)     Initial Impression / Assessment and Plan / ED Course  I have reviewed the triage vital signs and the nursing notes.  Pertinent labs & imaging results that were available during my care of the patient were reviewed by me and considered in my medical decision making (see chart for details).  Clinical Course as of Aug 15 2352  Sat Aug 15, 2018  2140 On reevaluation patient is resting comfortably in bed.  No acute distress.  Patient states that she is no longer feeling short of breath, states that she  is able to take a full breath without difficulty.  Patient states that she has not use supplemental oxygen at home, patient still on 2 L in room.  I have asked nurse to remove nasal cannula and observe patient to see if she desaturates.   [BM]  2217 Consult called to Cardiology. They have placed the patient on their list to follow-up next week and advise that  the patient follow-up outpatient with her PCP as well as her Cardiologist Dr. Stanford Breed.   [BM]  2322 Informed by RN that patient ambulated on pulse ox with O2 saturations between 95-96% on room air and heart rate between 60 and 80 bpm.  Patient ambulated well without difficulty or assistance.   [BM]  8101 On reevaluation patient was comfortable in room no acute distress at this time.  Patient states that she is eager to return home at this time.  Patient denying any and all pain at this time.  Patient denying shortness of breath.  Patient states that she felt well during ambulation earlier.   [BM]    Clinical Course User Index [BM] Deliah Boston, PA-C   Patient presenting for 3 days of shortness of breath.  Patient states that she has not taken her Lasix due to increasing shortness of breath and not wanting to get up to go to the bathroom constantly.  I have instructed the patient about the importance of staying compliant with her Lasix regimen.  Patient given IV Lasix here in department.  I-STAT troponin negative. BMP nonacute. CBC nonacute. Patient ambulated in department without desaturation or tachycardia on room air. CXR: Pulmonary vascular congestion with possible mild interstitial edema. EKG: Normal sinus rhythm - Atrial premature complexes Patient taking Eliquis, denies missing doses.  Spoke to Dr. Roderic Palau about this patient who does not believe that d-dimer would be appropriate today.  During evaluation in emergency department patient states that her symptoms dissipated.  Patient denying shortness of breath or any and all pain  at this time.  Cardiology consulted who advised that the patient restart her Lasix and follow-up with her cardiologist Dr. Stanford Breed or with their office next week.  I have spoken to the patient about her work-up today and advised her that she must follow-up with her cardiologist office or heartcare as soon as she is able to.  I also advised the patient to follow-up with your primary care provider regarding her visit today.  I informed the patient that it is very important for her to stay compliant with her Lasix as well as all of her other home medications prescribed by her cardiologist and primary care provider.  I also informed the patient of importance of taking her daily weights.   At this time there does not appear to be any evidence of an acute emergency medical condition and the patient appears stable for discharge with appropriate outpatient follow up. Diagnosis was discussed with patient who verbalizes understanding of care plan and is agreeable to discharge. I have discussed return precautions with patient and family at bedside who verbalize understanding of return precautions. Patient strongly encouraged to follow-up with their PCP. All questions answered.  Patient's case discussed with Dr. Roderic Palau who agrees with plan to discharge with follow-up.     Note: Portions of this report may have been transcribed using voice recognition software. Every effort was made to ensure accuracy; however, inadvertent computerized transcription errors may still be present.   Final Clinical Impressions(s) / ED Diagnoses   Final diagnoses:  Congestive heart failure, unspecified HF chronicity, unspecified heart failure type [facility])    ED Discharge Orders    None       Gari Crown 08/16/18 Evalee Mutton, MD 08/18/18 1207

## 2018-08-15 NOTE — ED Triage Notes (Signed)
[facility] reports pt has SOB x 3 days, weakness, DOE. Hx of afib, MI Did not take lasix today, but is generally compliant.  95% RA EMS Placed on 2 lpm 98% V/s 113/65 68 hr Denies NVD, [place]

## 2018-08-15 NOTE — Discharge Instructions (Addendum)
Please be sure to follow-up with your cardiologist as soon as possible regarding your visit today. Please follow-up with [facility] care cardiology division as soon as possible regarding your visit today. Please be sure to take your Lasix as prescribed by your cardiologist. Please be sure to weigh yourself daily to evaluate for changes to show your cardiologist. Please also follow-up with your primary care provider as soon as possible regarding your visit today. Please return to the emergency department for any new or worsening symptoms or if your symptoms do not improve. Please be sure to take all of your home medications prescribed by your primary care provider and cardiologist including your blood thinner.  Contact a doctor if: You gain weight quickly. You are more short of breath than usual. You cannot do your normal activities. You tire easily. You cough more than normal, especially with activity. You have any or more puffiness (swelling) in areas such as your hands, feet, ankles, or belly (abdomen). You cannot sleep because it is hard to breathe. You feel like your heart is beating fast (palpitations). You get dizzy or light-headed when you stand up. Get help right away if: You have trouble breathing. There is a change in mental status, such as becoming less alert or not being able to focus. You have chest pain or discomfort. You faint. Contact a health care provider if: Your condition does not improve as soon as expected. You have a hard time doing your normal activities, even after you rest. You have new symptoms. Get help right away if: Your shortness of breath gets worse. You have shortness of breath when you are resting. You feel light-headed or you faint. You have a cough that is not controlled with medicines. You cough up blood. You have pain with breathing. You have pain in your chest, arms, shoulders, or abdomen. You have a fever. You cannot  walk up stairs or exercise the way that you normally do.

## 2018-08-15 NOTE — ED Notes (Signed)
Meal given

## 2018-08-15 NOTE — ED Notes (Addendum)
94% resting ambulating 95-96% SPO2 HR 64-83 bpm

## 2018-08-16 ENCOUNTER — Other Ambulatory Visit: Payer: Self-pay | Admitting: Cardiology

## 2018-08-24 ENCOUNTER — Telehealth: Payer: Self-pay | Admitting: Cardiology

## 2018-08-24 NOTE — Telephone Encounter (Signed)
New Message:  Patient just need to speak with Debra Patient did not want to tell me.

## 2018-08-24 NOTE — Telephone Encounter (Signed)
Spoke with pt, she reported having to go to the ER recently for SOB and she has also seen her medical doctor. There has really been no change since either appointment. She denies weight gain or edema and is taking furosemide 20 mg once daily. She reports yesterday her heart rate was 150 bpm all day and her SOB was not as bad as when she went to the ER but was bad. Today she feels tired but her heart rate is back to normal. Her medical doctor did PFT's that were reported to her as okay and she was given an inhaler. Today she has SOB with little exertion. She has follow up appointment scheduled but is wanting to be seen sooner. Will look through schedule and call the patient back.

## 2018-08-24 NOTE — Telephone Encounter (Signed)
Follow up scheduled

## 2018-08-25 ENCOUNTER — Inpatient Hospital Stay (HOSPITAL_COMMUNITY): Admission: RE | Admit: 2018-08-25 | Payer: Medicare Other | Source: Ambulatory Visit

## 2018-08-26 NOTE — Progress Notes (Signed)
HPI: FU atrial fibrillation. She has a history of paroxysmal atrial fibrillation. Abdominal ultrasound May 2016 showed no aneurysm. Patient admitted with non-ST elevation myocardial infarction in June 2018. Cardiac catheterization revealed a 20% proximal to mid LAD and normal left ventricular end-diastolic pressure. Echocardiogram showed ejection fraction 53-66%, grade 2 diastolic dysfunction, mild tricuspid regurgitation, severely elevated pulmonary pressure. Sludge noted LV apex. Findings were felt suspicious for takotsubo CM.Echocardiogram repeated August 2018 and showed normal LV function, grade 1 diastolic dysfunction.Carotid Dopplers August 2018 showed 40-59% left and 1-39% right stenosis.ABIs March 2019 normal.   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  There was note of 0.9 cm lesion in the left kidney and further evaluation with renal protocol MRI suggested in 6 months.  Patient seen in the emergency room with increased shortness of breath August 2019.  She had not been taking her Lasix.  Lasix was resumed. Since last seen,she denies dyspnea, chest pain, palpitations or syncope.  She does have fatigue.  Current Outpatient Medications  Medication Sig Dispense Refill  . acetaminophen (TYLENOL) 650 MG CR tablet Take 1,300 mg by mouth every 8 (eight) hours as needed for pain.    . Cholecalciferol (VITAMIN D3) 1000 UNITS CAPS Take 1 tablet by mouth daily.    . DULoxetine (CYMBALTA) 30 MG capsule Take 30 mg by mouth daily.     . furosemide (LASIX) 20 MG tablet Take 1 tablet (20 mg total) by mouth daily. 90 tablet 1  . hydrALAZINE (APRESOLINE) 50 MG tablet Take 1 tablet (50 mg total) by mouth 3 (three) times daily. 90 tablet 11  . insulin glargine (LANTUS) 100 UNIT/ML injection Inject 0.15 mLs (15 Units total) into the skin at bedtime. (Patient taking  differently: Inject 40 Units into the skin every morning. ) 10 mL 11  . LORazepam (ATIVAN) 1 MG tablet Take 1 mg by mouth every 6 (six) hours as needed for anxiety.    . metoprolol tartrate (LOPRESSOR) 25 MG tablet Take 1 tablet (25 mg total) by mouth 2 (two) times daily. 180 tablet 3  . Multiple Vitamins-Minerals (CENTRUM SILVER PO) Take 1 tablet by mouth daily.    . nitroGLYCERIN (NITROSTAT) 0.3 MG SL tablet Place 1 tablet (0.3 mg total) under the tongue every 5 (five) minutes as needed for chest pain. 100 tablet 1  . pravastatin (PRAVACHOL) 10 MG tablet Take 10 mg by mouth every evening.     Alveda Reasons 20 MG TABS tablet TAKE 1 TABLET(20 MG) BY MOUTH DAILY WITH SUPPER 90 tablet 1   No current facility-administered medications for this visit.      Past Medical History:  Diagnosis Date  . Anemia    microcytic   . Arthritis   . Atrial fibrillation ([place])   . Cataract    REMOVED BILATERAL  . Colon polyps   . Diabetes mellitus ([place])   . Fibromyalgia   . Gastroparesis   . Hyperlipidemia   . Hypertension   . Kidney stones   . Melanoma ([place])   . PMR (polymyalgia rheumatica) ([facility])   . PVD (peripheral vascular disease) ([place])     Past Surgical History:  Procedure Laterality Date  . APPENDECTOMY    . BREAST BIOPSY    . CESAREAN SECTION    . COLONOSCOPY    . COLONOSCOPY WITH PROPOFOL N/A 12/26/2015   Procedure: COLONOSCOPY WITH PROPOFOL;  Surgeon: Remo Lipps  Gaye Alken, MD;  Location: [facility];  Service: Gastroenterology;  Laterality: N/A;  . ESOPHAGOGASTRODUODENOSCOPY (EGD) WITH PROPOFOL N/A 12/26/2015   Procedure: ESOPHAGOGASTRODUODENOSCOPY (EGD) WITH PROPOFOL;  Surgeon: Manus Gunning, MD;  Location: [facility];  Service: Gastroenterology;  Laterality: N/A;  . LEFT HEART CATH AND CORONARY ANGIOGRAPHY N/A 06/03/2017   Procedure: Left Heart Cath and Coronary Angiography;  Surgeon: Martinique, Peter M, MD;  Location: [facility] CV LAB;  Service: Cardiovascular;  Laterality:  N/A;  . POLYPECTOMY      Social History   Socioeconomic History  . Marital status: Widowed    Spouse name: Not on file  . Number of children: 1  . Years of education: Not on file  . Highest education level: Not on file  Occupational History  . Not on file  Social Needs  . Financial resource strain: Not on file  . Food insecurity:    Worry: Not on file    Inability: Not on file  . Transportation needs:    Medical: Not on file    Non-medical: Not on file  Tobacco Use  . Smoking status: Current Every Day Smoker    Packs/day: 1.00    Types: Cigarettes  . Smokeless tobacco: Never Used  Substance and Sexual Activity  . Alcohol use: No    Alcohol/week: 0.0 standard drinks  . Drug use: No  . Sexual activity: Not on file  Lifestyle  . Physical activity:    Days per week: Not on file    Minutes per session: Not on file  . Stress: Not on file  Relationships  . Social connections:    Talks on phone: Not on file    Gets together: Not on file    Attends religious service: Not on file    Active member of club or organization: Not on file    Attends meetings of clubs or organizations: Not on file    Relationship status: Not on file  . Intimate partner violence:    Fear of current or ex partner: Not on file    Emotionally abused: Not on file    Physically abused: Not on file    Forced sexual activity: Not on file  Other Topics Concern  . Not on file  Social History Narrative  . Not on file    Family History  Problem Relation Age of Onset  . CAD Father        MI and CABG  . Diabetes Father   . Heart disease Father   . Colon cancer Mother 61    ROS: Fatigue but no fevers or chills, productive cough, hemoptysis, dysphasia, odynophagia, melena, hematochezia, dysuria, hematuria, rash, seizure activity, orthopnea, PND, pedal edema, claudication. Remaining systems are negative.  Physical Exam: Well-developed well-nourished in no acute distress.  Skin is warm and dry.    HEENT is normal.  Neck is supple.  Chest is clear to auscultation with normal expansion.  Cardiovascular exam is regular rate and rhythm.  2/6 systolic ejection murmur Abdominal exam nontender or distended. No masses palpated. Extremities show no edema. neuro grossly intact  ECG-sinus rhythm with occasional PACs.  Normal axis.  Nonspecific ST changes.  Personally reviewed  A/P  1 paroxysmal atrial fibrillation-patient remains in sinus rhythm today.  Continue metoprolol for rate control if atrial fibrillation recurs.  Continue Xarelto.  2 hypertension-blood pressure is controlled today.  Continue present medications and follow.  3 history of Takotsubo cardiomyopathy-LV function normalized on most recent echocardiogram.  4 hyperlipidemia-continue  present dose of pravastatin.  Some myalgias with higher doses previously.  5 carotid artery disease-schedule follow-up carotid Dopplers.  6 peripheral vascular disease-plan medical therapy.  7 tobacco abuse-patient counseled on discontinuing.  8 renal mass-renal protocol MRI October 2019.  9 fatigue-check TSH.  Recent hemoglobin negative.  Kirk Ruths, MD

## 2018-08-27 ENCOUNTER — Ambulatory Visit: Payer: Medicare Other | Admitting: Cardiology

## 2018-08-27 ENCOUNTER — Encounter: Payer: Self-pay | Admitting: Cardiology

## 2018-08-27 VITALS — BP 115/66 | HR 71 | Ht 62.5 in | Wt 165.2 lb

## 2018-08-27 DIAGNOSIS — I10 Essential (primary) hypertension: Secondary | ICD-10-CM

## 2018-08-27 DIAGNOSIS — I480 Paroxysmal atrial fibrillation: Secondary | ICD-10-CM | POA: Diagnosis not present

## 2018-08-27 DIAGNOSIS — R5383 Other fatigue: Secondary | ICD-10-CM | POA: Diagnosis not present

## 2018-08-27 DIAGNOSIS — E7800 Pure hypercholesterolemia, unspecified: Secondary | ICD-10-CM

## 2018-08-27 DIAGNOSIS — I6523 Occlusion and stenosis of bilateral carotid arteries: Secondary | ICD-10-CM

## 2018-08-27 NOTE — Patient Instructions (Signed)

## 2018-08-28 LAB — TSH: TSH: 1.33 u[IU]/mL (ref 0.450–4.500)

## 2018-09-01 NOTE — Addendum Note (Signed)
Addended by: Leanord Asal T on: 09/01/2018 04:49 PM   Modules accepted: Orders

## 2018-09-03 ENCOUNTER — Encounter (HOSPITAL_COMMUNITY): Payer: Medicare Other

## 2018-09-03 ENCOUNTER — Ambulatory Visit: Payer: Medicare Other | Admitting: Physician Assistant

## 2018-09-11 ENCOUNTER — Emergency Department (HOSPITAL_COMMUNITY)
Admission: EM | Admit: 2018-09-11 | Discharge: 2018-09-11 | Disposition: A | Discharge status: Home / Self Care | Payer: Medicare Other | Attending: Emergency Medicine | Admitting: Emergency Medicine

## 2018-09-11 ENCOUNTER — Other Ambulatory Visit: Payer: Self-pay

## 2018-09-11 ENCOUNTER — Encounter (HOSPITAL_COMMUNITY): Payer: Self-pay

## 2018-09-11 DIAGNOSIS — M549 Dorsalgia, unspecified: Secondary | ICD-10-CM | POA: Diagnosis not present

## 2018-09-11 DIAGNOSIS — R509 Fever, unspecified: Secondary | ICD-10-CM | POA: Diagnosis not present

## 2018-09-11 DIAGNOSIS — Z5321 Procedure and treatment not carried out due to patient leaving prior to being seen by health care provider: Secondary | ICD-10-CM | POA: Diagnosis not present

## 2018-09-11 LAB — COMPREHENSIVE METABOLIC PANEL
ALBUMIN: 3.5 g/dL (ref 3.5–5.0)
ALT: 19 U/L (ref 0–44)
ANION GAP: 10 (ref 5–15)
AST: 20 U/L (ref 15–41)
Alkaline Phosphatase: 62 U/L (ref 38–126)
BUN: 35 mg/dL — ABNORMAL HIGH (ref 8–23)
CHLORIDE: 102 mmol/L (ref 98–111)
CO2: 25 mmol/L (ref 22–32)
Calcium: 9.2 mg/dL (ref 8.9–10.3)
Creatinine, Ser: 1.14 mg/dL — ABNORMAL HIGH (ref 0.44–1.00)
GFR calc non Af Amer: 45 mL/min — ABNORMAL LOW (ref >60)
GFR, EST AFRICAN AMERICAN: 52 mL/min — AB (ref >60)
GLUCOSE: 87 mg/dL (ref 70–99)
Potassium: 3.6 mmol/L (ref 3.5–5.1)
SODIUM: 137 mmol/L (ref 135–145)
Total Bilirubin: 0.7 mg/dL (ref 0.3–1.2)
Total Protein: 7 g/dL (ref 6.5–8.1)

## 2018-09-11 NOTE — ED Notes (Signed)
Pt called for recheck v/s, no response from lobby 

## 2018-09-11 NOTE — ED Notes (Signed)
Pt called x'3, no response 

## 2018-09-11 NOTE — ED Triage Notes (Signed)
Pt presents to ED from UC for fever and lower back pain. Pt reports that she has had lower back and R flank pain for 3-4 days. Pt reports that she woke up from a nap today and had a fever. UC told pt to come to ED.

## 2018-09-11 NOTE — ED Notes (Signed)
Pt is no longer in the lobby or outside

## 2018-09-14 ENCOUNTER — Emergency Department (HOSPITAL_COMMUNITY)
Admission: EM | Admit: 2018-09-14 | Discharge: 2018-09-14 | Disposition: A | Discharge status: Home / Self Care | Payer: Medicare Other | Source: Home / Self Care | Attending: Emergency Medicine | Admitting: Emergency Medicine

## 2018-09-14 ENCOUNTER — Other Ambulatory Visit: Payer: Self-pay

## 2018-09-14 ENCOUNTER — Encounter (HOSPITAL_COMMUNITY): Payer: Self-pay | Admitting: Emergency Medicine

## 2018-09-14 ENCOUNTER — Emergency Department (HOSPITAL_COMMUNITY): Payer: Medicare Other

## 2018-09-14 DIAGNOSIS — E119 Type 2 diabetes mellitus without complications: Secondary | ICD-10-CM | POA: Insufficient documentation

## 2018-09-14 DIAGNOSIS — F17210 Nicotine dependence, cigarettes, uncomplicated: Secondary | ICD-10-CM | POA: Insufficient documentation

## 2018-09-14 DIAGNOSIS — Z7901 Long term (current) use of anticoagulants: Secondary | ICD-10-CM | POA: Insufficient documentation

## 2018-09-14 DIAGNOSIS — N390 Urinary tract infection, site not specified: Secondary | ICD-10-CM | POA: Insufficient documentation

## 2018-09-14 DIAGNOSIS — Z79899 Other long term (current) drug therapy: Secondary | ICD-10-CM | POA: Insufficient documentation

## 2018-09-14 DIAGNOSIS — N10 Acute tubulo-interstitial nephritis: Secondary | ICD-10-CM | POA: Diagnosis not present

## 2018-09-14 DIAGNOSIS — I10 Essential (primary) hypertension: Secondary | ICD-10-CM

## 2018-09-14 DIAGNOSIS — Z794 Long term (current) use of insulin: Secondary | ICD-10-CM | POA: Insufficient documentation

## 2018-09-14 DIAGNOSIS — Z85820 Personal history of malignant melanoma of skin: Secondary | ICD-10-CM

## 2018-09-14 LAB — COMPREHENSIVE METABOLIC PANEL
ALT: 18 U/L (ref 0–44)
AST: 20 U/L (ref 15–41)
Albumin: 3.2 g/dL — ABNORMAL LOW (ref 3.5–5.0)
Alkaline Phosphatase: 73 U/L (ref 38–126)
Anion gap: 11 (ref 5–15)
BUN: 36 mg/dL — AB (ref 8–23)
CO2: 24 mmol/L (ref 22–32)
CREATININE: 1.27 mg/dL — AB (ref 0.44–1.00)
Calcium: 8.6 mg/dL — ABNORMAL LOW (ref 8.9–10.3)
Chloride: 101 mmol/L (ref 98–111)
GFR calc non Af Amer: 40 mL/min — ABNORMAL LOW (ref >60)
GFR, EST AFRICAN AMERICAN: 46 mL/min — AB (ref >60)
Glucose, Bld: 123 mg/dL — ABNORMAL HIGH (ref 70–99)
Potassium: 3.8 mmol/L (ref 3.5–5.1)
SODIUM: 136 mmol/L (ref 135–145)
Total Bilirubin: 1 mg/dL (ref 0.3–1.2)
Total Protein: 7 g/dL (ref 6.5–8.1)

## 2018-09-14 LAB — CBC WITH DIFFERENTIAL/PLATELET
Basophils Absolute: 0 10*3/uL (ref 0.0–0.1)
Basophils Relative: 0 %
EOS ABS: 0 10*3/uL (ref 0.0–0.7)
EOS PCT: 0 %
HCT: 39.6 % (ref 36.0–46.0)
Hemoglobin: 13.2 g/dL (ref 12.0–15.0)
LYMPHS ABS: 1 10*3/uL (ref 0.7–4.0)
Lymphocytes Relative: 7 %
MCH: 30.8 pg (ref 26.0–34.0)
MCHC: 33.3 g/dL (ref 30.0–36.0)
MCV: 92.5 fL (ref 78.0–100.0)
MONO ABS: 1.2 10*3/uL — AB (ref 0.1–1.0)
Monocytes Relative: 9 %
Neutro Abs: 11.3 10*3/uL — ABNORMAL HIGH (ref 1.7–7.7)
Neutrophils Relative %: 84 %
Platelets: 256 10*3/uL (ref 150–400)
RBC: 4.28 MIL/uL (ref 3.87–5.11)
RDW: 14 % (ref 11.5–15.5)
WBC: 13.4 10*3/uL — AB (ref 4.0–10.5)

## 2018-09-14 LAB — URINALYSIS, ROUTINE W REFLEX MICROSCOPIC
Bilirubin Urine: NEGATIVE
Glucose, UA: NEGATIVE mg/dL
Hgb urine dipstick: NEGATIVE
KETONES UR: 5 mg/dL — AB
Nitrite: NEGATIVE
PROTEIN: 30 mg/dL — AB
Specific Gravity, Urine: 1.01 (ref 1.005–1.030)
pH: 5 (ref 5.0–8.0)

## 2018-09-14 LAB — CBG MONITORING, ED: GLUCOSE-CAPILLARY: 105 mg/dL — AB (ref 70–99)

## 2018-09-14 LAB — PROTIME-INR
INR: 2.23
PROTHROMBIN TIME: 24.6 s — AB (ref 11.4–15.2)

## 2018-09-14 LAB — I-STAT CG4 LACTIC ACID, ED: Lactic Acid, Venous: 0.84 mmol/L (ref 0.5–1.9)

## 2018-09-14 LAB — CK: Total CK: 111 U/L (ref 38–234)

## 2018-09-14 MED ORDER — SODIUM CHLORIDE 0.9 % IV SOLN
1.0000 g | Freq: Once | INTRAVENOUS | Status: AC
  Administered 2018-09-14: 1 g via INTRAVENOUS
  Filled 2018-09-14: qty 10

## 2018-09-14 MED ORDER — METOPROLOL TARTRATE 25 MG PO TABS
25.0000 mg | ORAL_TABLET | Freq: Once | ORAL | Status: AC
  Administered 2018-09-14: 25 mg via ORAL
  Filled 2018-09-14: qty 1

## 2018-09-14 MED ORDER — METOPROLOL TARTRATE 5 MG/5ML IV SOLN
5.0000 mg | Freq: Once | INTRAVENOUS | Status: AC
  Administered 2018-09-14: 5 mg via INTRAVENOUS
  Filled 2018-09-14: qty 5

## 2018-09-14 MED ORDER — SODIUM CHLORIDE 0.9 % IV BOLUS
1000.0000 mL | Freq: Once | INTRAVENOUS | Status: AC
  Administered 2018-09-14: 1000 mL via INTRAVENOUS

## 2018-09-14 MED ORDER — CEPHALEXIN 500 MG PO CAPS: 500.0000 mg | ORAL_CAPSULE | Freq: Two times a day (BID) | ORAL | 0 refills | Status: DC

## 2018-09-14 MED ORDER — HYDRALAZINE HCL 50 MG PO TABS
50.0000 mg | ORAL_TABLET | Freq: Once | ORAL | Status: DC
  Filled 2018-09-14: qty 1

## 2018-09-14 NOTE — ED Provider Notes (Signed)
[facility] DEPT Provider Note   CSN: [id number] Arrival date & time: 09/14/18  0803     History   Chief Complaint Chief Complaint  Patient presents with  . Back Pain  . Fever    possible UTI  . Weakness    HPI Kathleen Griffith is a 77 y.o. female.  77 year old female with past medical history including atrial fibrillation on Xarelto, IDDM, PVD, gastroparesis, polymyalgia rheumatica who presents with fever and weakness.  The patient states that she has had 5 to 6 weeks of intermittent burning after she urinates and occasional pink streaks in her urine.  She has incontinence at baseline.  3 to 4 days ago, she began running fevers up to 102.4 and having chills at home.  She went to an urgent care, was given Tylenol and referred to the ER.  She waited to be seen for about 5 hours and then left prior to evaluation.  She has continued to take Tylenol at home.  She has chronic back pain and it is sometimes right-sided.  She continues to have this same pain but states that it has just been more frequent than usual.  This morning she walked from her bedroom to her kitchen and tried to sit in her recliner but was too weak so she slid and lowered herself to the floor.  She laid on the floor for 4 hours prior to arrival here.  She denies any fall, head injury, or loss of consciousness.  She has had nausea and decreased appetite but no vomiting, diarrhea, abdominal pain, cough, URI symptoms, breathing problems, or recent antibiotics for UTI.  The history is provided by the patient.  Back Pain   Associated symptoms include a fever and weakness.  Fever    Weakness     Past Medical History:  Diagnosis Date  . Anemia    microcytic   . Arthritis   . Atrial fibrillation ([place])   . Cataract    REMOVED BILATERAL  . Colon polyps   . Diabetes mellitus ([place])   . Fibromyalgia   . Gastroparesis   . Hyperlipidemia   . Hypertension   . Kidney stones   . Melanoma ([place])    . PMR (polymyalgia rheumatica) ([facility])   . PVD (peripheral vascular disease) [facility])     Patient Active Problem List   Diagnosis Date Noted  . Depression 12/03/2017  . Normal coronary arteries 12/03/2017  . 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 Polymyalgia rheumatica ([place]) 12/03/2017  . NSTEMI (non-ST elevated myocardial infarction) ([place]) 06/02/2017  . Fracture of rib of right side 03/20/2016  . Acute respiratory failure with hypoxia ([place]) 03/20/2016  . PAF (paroxysmal atrial fibrillation) ([place]) 03/20/2016  . Pneumothorax on right 03/20/2016  . ARDS (adult respiratory distress syndrome) ([place]) 03/20/2016  . Insulin dependent diabetes mellitus ([place]) 03/20/2016  . GERD (gastroesophageal reflux disease) 03/20/2016  . Dysphagia   . Abdominal pain, epigastric   . Gastric polyp   . Anemia, iron deficiency   . Angiodysplasia of cecum   . Colon polyp   . 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 Chronic anticoagulation 07/28/2015  . Family history of colon cancer 07/28/2015  . CN (constipation) 07/28/2015  . Paroxysmal atrial fibrillation ([place]) 04/04/2015  . Carotid artery disease ([place]) 04/04/2015  . Tobacco abuse 04/04/2015  . Essential hypertension 04/04/2015  . Hyperlipidemia 04/04/2015    Past Surgical History:  Procedure Laterality Date  . APPENDECTOMY    . BREAST BIOPSY    . CESAREAN  SECTION    . COLONOSCOPY    . COLONOSCOPY WITH PROPOFOL N/A 12/26/2015   Procedure: COLONOSCOPY WITH PROPOFOL;  Surgeon: Manus Gunning, MD;  Location: [facility] END[facility]COPY;  Service: Gastroenterology;  Laterality: N/A;  . ESOPHAGOGASTRODUODENOSCOPY (EGD) WITH PROPOFOL N/A 12/26/2015   Procedure: ESOPHAGOGASTRODUODENOSCOPY (EGD) WITH PROPOFOL;  Surgeon: Manus Gunning, MD;  Location: [facility]OS[facility];  Service: Gastroenterology;  Laterality: N/A;  . LEFT HEART CATH AND CORONARY ANGIOGRAPHY N/A 06/03/2017   Procedure: Left Heart Cath and Coronary Angiography;  Surgeon: Martinique, Peter M, MD;  Location: [facility] LAB;  Service: Cardiovascular;  Laterality: N/A;  . POLYPECTOMY       OB History   None      Home Medications    Prior to Admission medications   Medication Sig Start Date End Date Taking? Authorizing Provider  acetaminophen (TYLENOL) 650 MG CR tablet Take 1,300 mg by mouth every 8 (eight) hours as needed for pain.   Yes [provider]  albuterol (PROVENTIL HFA;VENTOLIN HFA) 108 (90 Base) MCG/ACT inhaler Inhale 1 puff into the lungs every 6 (six) hours as needed for wheezing or shortness of breath.   Yes [provider]  Cholecalciferol (VITAMIN D3) 1000 UNITS CAPS Take 1 tablet by mouth daily.   Yes [provider]  DULoxetine (CYMBALTA) 30 MG capsule Take 30 mg by mouth daily.    Yes [provider]  furosemide (LASIX) 20 MG tablet Take 1 tablet (20 mg total) by mouth daily. 06/19/18  Yes Lelon Perla, MD  hydrALAZINE (APRESOLINE) 50 MG tablet Take 1 tablet (50 mg total) by mouth 3 (three) times daily. Patient taking differently: Take 50 mg by mouth 2 (two) times daily.  07/13/18 10/11/18 Yes Lelon Perla, MD  insulin glargine (LANTUS) 100 UNIT/ML injection Inject 0.15 mLs (15 Units total) into the skin at bedtime. Patient taking differently: Inject 40 Units into the skin every morning.  06/06/17  Yes [name], MD  metoprolol tartrate (LOPRESSOR) 25 MG tablet Take 1 tablet (25 mg total) by mouth 2 (two) times daily. 03/27/18  Yes Lelon Perla, MD  Multiple Vitamins-Minerals (CENTRUM SILVER PO) Take 1 tablet by mouth daily.   Yes [provider]  nitroGLYCERIN (NITROSTAT) 0.3 MG SL tablet Place 1 tablet (0.3 mg total) under the tongue every 5 (five) minutes as needed for chest pain. 06/09/18  Yes Lelon Perla, MD  pravastatin (PRAVACHOL) 10 MG tablet Take 10 mg by mouth every evening.    Yes [provider]  XARELTO 20 MG TABS tablet TAKE 1 TABLET(20 MG) BY MOUTH DAILY WITH SUPPER Patient taking  differently: Take 20 mg by mouth daily.  08/18/18  Yes Lelon Perla, MD  cephALEXin (KEFLEX) 500 MG capsule Take 1 capsule (500 mg total) by mouth 2 (two) times daily for 14 days. 09/14/18 09/28/18  Little, Wenda Overland, MD    Family History Family History  Problem Relation Age of Onset  . CAD Father        MI and CABG  . Diabetes Father   . Heart disease Father   . Colon cancer Mother 73    Social History Social History   Tobacco Use  . Smoking status: Current Every Day Smoker    Packs/day: 1.00    Types: Cigarettes  . Smokeless tobacco: Never Used  Substance Use Topics  . Alcohol use: No    Alcohol/week: 0.0 standard drinks  . Drug use: No  Allergies   Losartan; Pregabalin; Bupropion; Metoclopramide; Lisinopril; Metformin; and Metformin and related   Review of Systems Review of Systems  Constitutional: Positive for fever.  Musculoskeletal: Positive for back pain.  Neurological: Positive for weakness.   All other systems reviewed and are negative except that which was mentioned in HPI   Physical Exam Updated Vital Signs BP 119/74   Pulse (!) 110   Temp 98.3 F (36.8 C) (Axillary)   Resp (!) 25   Ht 5\' 4"  (1.626 m)   Wt 72.1 kg   SpO2 92%   BMI 27.29 kg/m   Physical Exam  Constitutional: She is oriented to person, place, and time. She appears well-developed and well-nourished. No distress.  HENT:  Head: Normocephalic and atraumatic.  Moist mucous membranes  Eyes: Pupils are equal, round, and reactive to light. Conjunctivae are normal.  Neck: Neck supple.  Cardiovascular: Normal rate and regular rhythm.  Murmur heard. Pulmonary/Chest: Effort normal and breath sounds normal.  Abdominal: Soft. Bowel sounds are normal. She exhibits no distension. There is no tenderness.  Musculoskeletal: She exhibits no edema.  Neurological: She is alert and oriented to person, place, and time.  Fluent speech  Skin: Skin is warm and dry.  Psychiatric: She has a  normal mood and affect. Judgment normal.  Nursing note and vitals reviewed.    ED Treatments / Results  Labs (all labs ordered are listed, but only abnormal results are displayed) Labs Reviewed  COMPREHENSIVE METABOLIC PANEL - Abnormal; Notable for the following components:      Result Value   Glucose, Bld 123 (*)    BUN 36 (*)    Creatinine, Ser 1.27 (*)    Calcium 8.6 (*)    Albumin 3.2 (*)    GFR calc non Af Amer 40 (*)    GFR calc Af Amer 46 (*)    All other components within normal limits  CBC WITH DIFFERENTIAL/PLATELET - Abnormal; Notable for the following components:   WBC 13.4 (*)    Neutro Abs 11.3 (*)    Monocytes Absolute 1.2 (*)    All other components within normal limits  PROTIME-INR - Abnormal; Notable for the following components:   Prothrombin Time 24.6 (*)    All other components within normal limits  URINALYSIS, ROUTINE W REFLEX MICROSCOPIC - Abnormal; Notable for the following components:   APPearance HAZY (*)    Ketones, ur 5 (*)    Protein, ur 30 (*)    Leukocytes, UA MODERATE (*)    Bacteria, UA MANY (*)    All other components within normal limits  CBG MONITORING, ED - Abnormal; Notable for the following components:   Glucose-Capillary 105 (*)    All other components within normal limits  CULTURE, BLOOD (ROUTINE X 2)  CULTURE, BLOOD (ROUTINE X 2)  URINE CULTURE  CK  I-STAT CG4 LACTIC ACID, ED    EKG None  Radiology Dg Chest 2 View  Result Date: 09/14/2018 CLINICAL DATA:  Fever EXAM: CHEST - 2 VIEW COMPARISON:  08/15/2018 FINDINGS: Peribronchial thickening. Heart is upper limits normal in size. No confluent airspace opacities or effusions. No acute bony abnormality. IMPRESSION: Bronchitic changes. Electronically Signed   By: [name] M.D.   On: 09/14/2018 09:01    Procedures Procedures (including critical care time)  Medications Ordered in ED Medications  hydrALAZINE (APRESOLINE) tablet 50 mg (50 mg Oral Not Given 09/14/18 1245)    sodium chloride 0.9 % bolus 1,000 mL (0 mLs Intravenous Stopped 09/14/18  1206)  cefTRIAXone (ROCEPHIN) 1 g in sodium chloride 0.9 % 100 mL IVPB (0 g Intravenous Stopped 09/14/18 1206)  metoprolol tartrate (LOPRESSOR) tablet 25 mg (25 mg Oral Given 09/14/18 1244)  metoprolol tartrate (LOPRESSOR) injection 5 mg (5 mg Intravenous Given 09/14/18 1402)  metoprolol tartrate (LOPRESSOR) injection 5 mg (5 mg Intravenous Given 09/14/18 1556)     Initial Impression / Assessment and Plan / ED Course  I have reviewed the triage vital signs and the nursing notes.  Pertinent labs & imaging results that were available during my care of the patient were reviewed by me and considered in my medical decision making (see chart for details).    Pt well appearing on exam, normal VS. No abd tenderness. Because of ongoing fevers, obtained blood cultures as well as above labs.  Lab work shows UA consistent with infection.  W BC 13.3, 91.27 for which I gave her small fluid bolus as suspect mild dehydration.  Test x-ray negative acute.  No significant blood in UA and given that her back pain is chronic, I do not suspect infected stone.  Gave the patient a dose of ceftriaxone.  She had missed her home metoprolol dose and was tachycardic, known history of atrial fibrillation.  She required several doses of metoprolol but have achieved rate control on reassessment.  I have instructed her to continue her home doses of medications.  Provided with course of Keflex to cover for pyelonephritis given her fevers.  Instructed to see PCP in 2 to 3 days for reassessment.  Extensively reviewed return precautions with patient and her son and they voiced understanding.  Discharged in satisfactory condition.  Final Clinical Impressions(s) / ED Diagnoses   Final diagnoses:  Urinary tract infection without hematuria, site unspecified    ED Discharge Orders         Ordered    cephALEXin (KEFLEX) 500 MG capsule  2 times daily     09/14/18  1637           Little, Wenda Overland, MD 09/14/18 1643

## 2018-09-14 NOTE — ED Triage Notes (Signed)
Patient BIB [facility] from home for fever, weakness, uti symptoms x4 days. This am pt attempted to walk from bdrm to kitchen, realized she was too weak to sit in chair, used chair to lower self to floor. Pt reports lying on floor x 4hrs which has caused her chronic bp to worsen. Pt was seen at urgent care Friday with temp of 102.4 and referred to ED. [facility] and LWBS, continued to take tylenol. Pt reports burning after urinating, and streaks of pink in urine. Pt a/o x4.

## 2018-09-14 NOTE — ED Notes (Addendum)
First set of cultures drawn from right AC, 5cc's in each. Lavender, light green, dark green with save labels.

## 2018-09-14 NOTE — ED Notes (Signed)
ED Provider at bedside. 

## 2018-09-14 NOTE — ED Notes (Signed)
Bed: WA04 Expected date:  Expected time:  Means of arrival:  Comments: 77 yo f fall, possible uti

## 2018-09-14 NOTE — ED Notes (Signed)
Patient transported to X-ray 

## 2018-09-14 NOTE — ED Notes (Signed)
Pt is incontinent. Pure wick has been placed upon arrival.

## 2018-09-15 ENCOUNTER — Inpatient Hospital Stay (HOSPITAL_COMMUNITY)
Admission: EM | Admit: 2018-09-15 | Discharge: 2018-09-18 | DRG: 690 | Disposition: A | Discharge status: Home / Self Care | Payer: Medicare Other | Attending: Family Medicine | Admitting: Family Medicine

## 2018-09-15 ENCOUNTER — Other Ambulatory Visit: Payer: Self-pay

## 2018-09-15 ENCOUNTER — Encounter (HOSPITAL_COMMUNITY): Payer: Self-pay

## 2018-09-15 ENCOUNTER — Telehealth: Payer: Self-pay | Admitting: Cardiology

## 2018-09-15 ENCOUNTER — Telehealth (HOSPITAL_BASED_OUTPATIENT_CLINIC_OR_DEPARTMENT_OTHER): Payer: Self-pay | Admitting: Emergency Medicine

## 2018-09-15 DIAGNOSIS — J449 Chronic obstructive pulmonary disease, unspecified: Secondary | ICD-10-CM | POA: Diagnosis present

## 2018-09-15 DIAGNOSIS — I480 Paroxysmal atrial fibrillation: Secondary | ICD-10-CM | POA: Diagnosis present

## 2018-09-15 DIAGNOSIS — Z888 Allergy status to other drugs, medicaments and biological substances status: Secondary | ICD-10-CM | POA: Diagnosis not present

## 2018-09-15 DIAGNOSIS — K219 Gastro-esophageal reflux disease without esophagitis: Secondary | ICD-10-CM | POA: Diagnosis present

## 2018-09-15 DIAGNOSIS — I493 Ventricular premature depolarization: Secondary | ICD-10-CM | POA: Diagnosis not present

## 2018-09-15 DIAGNOSIS — I4891 Unspecified atrial fibrillation: Secondary | ICD-10-CM | POA: Diagnosis not present

## 2018-09-15 DIAGNOSIS — N10 Acute tubulo-interstitial nephritis: Principal | ICD-10-CM | POA: Diagnosis present

## 2018-09-15 DIAGNOSIS — M353 Polymyalgia rheumatica: Secondary | ICD-10-CM | POA: Diagnosis present

## 2018-09-15 DIAGNOSIS — Z794 Long term (current) use of insulin: Secondary | ICD-10-CM

## 2018-09-15 DIAGNOSIS — Z79899 Other long term (current) drug therapy: Secondary | ICD-10-CM

## 2018-09-15 DIAGNOSIS — E876 Hypokalemia: Secondary | ICD-10-CM

## 2018-09-15 DIAGNOSIS — I110 Hypertensive heart disease with heart failure: Secondary | ICD-10-CM | POA: Diagnosis present

## 2018-09-15 DIAGNOSIS — M797 Fibromyalgia: Secondary | ICD-10-CM | POA: Diagnosis present

## 2018-09-15 DIAGNOSIS — N12 Tubulo-interstitial nephritis, not specified as acute or chronic: Secondary | ICD-10-CM | POA: Diagnosis not present

## 2018-09-15 DIAGNOSIS — I5032 Chronic diastolic (congestive) heart failure: Secondary | ICD-10-CM | POA: Diagnosis present

## 2018-09-15 DIAGNOSIS — E1151 Type 2 diabetes mellitus with diabetic peripheral angiopathy without gangrene: Secondary | ICD-10-CM | POA: Diagnosis present

## 2018-09-15 DIAGNOSIS — Z85820 Personal history of malignant melanoma of skin: Secondary | ICD-10-CM

## 2018-09-15 DIAGNOSIS — Z9842 Cataract extraction status, left eye: Secondary | ICD-10-CM

## 2018-09-15 DIAGNOSIS — B9620 Unspecified Escherichia coli [E. coli] as the cause of diseases classified elsewhere: Secondary | ICD-10-CM | POA: Diagnosis present

## 2018-09-15 DIAGNOSIS — E1143 Type 2 diabetes mellitus with diabetic autonomic (poly)neuropathy: Secondary | ICD-10-CM | POA: Diagnosis present

## 2018-09-15 DIAGNOSIS — F17210 Nicotine dependence, cigarettes, uncomplicated: Secondary | ICD-10-CM | POA: Diagnosis present

## 2018-09-15 DIAGNOSIS — R7881 Bacteremia: Secondary | ICD-10-CM | POA: Diagnosis present

## 2018-09-15 DIAGNOSIS — I252 Old myocardial infarction: Secondary | ICD-10-CM | POA: Diagnosis not present

## 2018-09-15 DIAGNOSIS — K3184 Gastroparesis: Secondary | ICD-10-CM | POA: Diagnosis present

## 2018-09-15 DIAGNOSIS — Z7901 Long term (current) use of anticoagulants: Secondary | ICD-10-CM

## 2018-09-15 DIAGNOSIS — Z833 Family history of diabetes mellitus: Secondary | ICD-10-CM

## 2018-09-15 DIAGNOSIS — I10 Essential (primary) hypertension: Secondary | ICD-10-CM | POA: Diagnosis not present

## 2018-09-15 DIAGNOSIS — N289 Disorder of kidney and ureter, unspecified: Secondary | ICD-10-CM | POA: Diagnosis present

## 2018-09-15 DIAGNOSIS — N179 Acute kidney failure, unspecified: Secondary | ICD-10-CM | POA: Diagnosis present

## 2018-09-15 DIAGNOSIS — Z8249 Family history of ischemic heart disease and other diseases of the circulatory system: Secondary | ICD-10-CM

## 2018-09-15 DIAGNOSIS — Z9841 Cataract extraction status, right eye: Secondary | ICD-10-CM

## 2018-09-15 DIAGNOSIS — I5181 Takotsubo syndrome: Secondary | ICD-10-CM | POA: Diagnosis present

## 2018-09-15 DIAGNOSIS — R001 Bradycardia, unspecified: Secondary | ICD-10-CM | POA: Diagnosis present

## 2018-09-15 DIAGNOSIS — Z800 Family history of malignant neoplasm of digestive organs: Secondary | ICD-10-CM

## 2018-09-15 DIAGNOSIS — E785 Hyperlipidemia, unspecified: Secondary | ICD-10-CM | POA: Diagnosis present

## 2018-09-15 DIAGNOSIS — R32 Unspecified urinary incontinence: Secondary | ICD-10-CM | POA: Diagnosis present

## 2018-09-15 LAB — CBC WITH DIFFERENTIAL/PLATELET
BASOS ABS: 0.1 10*3/uL (ref 0.0–0.1)
Basophils Relative: 1 %
EOS ABS: 0.1 10*3/uL (ref 0.0–0.7)
EOS PCT: 1 %
HCT: 39 % (ref 36.0–46.0)
Hemoglobin: 13 g/dL (ref 12.0–15.0)
LYMPHS ABS: 1.2 10*3/uL (ref 0.7–4.0)
Lymphocytes Relative: 12 %
MCH: 31.2 pg (ref 26.0–34.0)
MCHC: 33.3 g/dL (ref 30.0–36.0)
MCV: 93.5 fL (ref 78.0–100.0)
Monocytes Absolute: 1.2 10*3/uL — ABNORMAL HIGH (ref 0.1–1.0)
Monocytes Relative: 11 %
NEUTROS ABS: 8 10*3/uL — AB (ref 1.7–7.7)
NEUTROS PCT: 75 %
PLATELETS: 250 10*3/uL (ref 150–400)
RBC: 4.17 MIL/uL (ref 3.87–5.11)
RDW: 14.1 % (ref 11.5–15.5)
WBC: 10.5 10*3/uL (ref 4.0–10.5)

## 2018-09-15 LAB — BLOOD CULTURE ID PANEL (REFLEXED)
ACINETOBACTER BAUMANNII: NOT DETECTED
CANDIDA ALBICANS: NOT DETECTED
CANDIDA GLABRATA: NOT DETECTED
CANDIDA PARAPSILOSIS: NOT DETECTED
CANDIDA TROPICALIS: NOT DETECTED
Candida krusei: NOT DETECTED
Carbapenem resistance: NOT DETECTED
ENTEROBACTER CLOACAE COMPLEX: NOT DETECTED
Enterobacteriaceae species: DETECTED — AB
Enterococcus species: NOT DETECTED
Escherichia coli: DETECTED — AB
HAEMOPHILUS INFLUENZAE: NOT DETECTED
KLEBSIELLA OXYTOCA: NOT DETECTED
Klebsiella pneumoniae: NOT DETECTED
Listeria monocytogenes: NOT DETECTED
Neisseria meningitidis: NOT DETECTED
PROTEUS SPECIES: NOT DETECTED
Pseudomonas aeruginosa: NOT DETECTED
Serratia marcescens: NOT DETECTED
Staphylococcus aureus (BCID): NOT DETECTED
Staphylococcus species: NOT DETECTED
Streptococcus agalactiae: NOT DETECTED
Streptococcus pneumoniae: NOT DETECTED
Streptococcus pyogenes: NOT DETECTED
Streptococcus species: NOT DETECTED

## 2018-09-15 LAB — BASIC METABOLIC PANEL
ANION GAP: 13 (ref 5–15)
BUN: 28 mg/dL — ABNORMAL HIGH (ref 8–23)
CHLORIDE: 106 mmol/L (ref 98–111)
CO2: 21 mmol/L — AB (ref 22–32)
Calcium: 7.6 mg/dL — ABNORMAL LOW (ref 8.9–10.3)
Creatinine, Ser: 0.99 mg/dL (ref 0.44–1.00)
GFR calc non Af Amer: 54 mL/min — ABNORMAL LOW (ref >60)
Glucose, Bld: 102 mg/dL — ABNORMAL HIGH (ref 70–99)
POTASSIUM: 3.3 mmol/L — AB (ref 3.5–5.1)
SODIUM: 140 mmol/L (ref 135–145)

## 2018-09-15 LAB — PROTIME-INR
INR: 1.3
Prothrombin Time: 16.1 seconds — ABNORMAL HIGH (ref 11.4–15.2)

## 2018-09-15 LAB — I-STAT TROPONIN, ED: TROPONIN I, POC: 0.05 ng/mL (ref 0.00–0.08)

## 2018-09-15 LAB — GLUCOSE, CAPILLARY
GLUCOSE-CAPILLARY: 125 mg/dL — AB (ref 70–99)
GLUCOSE-CAPILLARY: 89 mg/dL (ref 70–99)

## 2018-09-15 LAB — I-STAT CG4 LACTIC ACID, ED: Lactic Acid, Venous: 0.93 mmol/L (ref 0.5–1.9)

## 2018-09-15 MED ORDER — ENOXAPARIN SODIUM 40 MG/0.4ML ~~LOC~~ SOLN: 40.0000 mg | SUBCUTANEOUS | Status: DC

## 2018-09-15 MED ORDER — SODIUM CHLORIDE 0.9 % IV SOLN
1.0000 g | Freq: Once | INTRAVENOUS | Status: AC
  Administered 2018-09-15: 1 g via INTRAVENOUS
  Filled 2018-09-15: qty 10

## 2018-09-15 MED ORDER — SODIUM CHLORIDE 0.9 % IV BOLUS
1000.0000 mL | Freq: Once | INTRAVENOUS | Status: AC
  Administered 2018-09-15: 1000 mL via INTRAVENOUS

## 2018-09-15 MED ORDER — SODIUM CHLORIDE 0.9 % IV SOLN: Freq: Once | INTRAVENOUS | Status: AC

## 2018-09-15 MED ORDER — INSULIN ASPART 100 UNIT/ML ~~LOC~~ SOLN
0.0000 [IU] | Freq: Three times a day (TID) | SUBCUTANEOUS | Status: DC
  Administered 2018-09-15: 1 [IU] via SUBCUTANEOUS

## 2018-09-15 MED ORDER — SODIUM CHLORIDE 0.9 % IV SOLN
1.0000 g | INTRAVENOUS | Status: DC
  Filled 2018-09-15: qty 10

## 2018-09-15 MED ORDER — INSULIN ASPART 100 UNIT/ML ~~LOC~~ SOLN
3.0000 [IU] | Freq: Three times a day (TID) | SUBCUTANEOUS | Status: DC
  Administered 2018-09-15: 3 [IU] via SUBCUTANEOUS

## 2018-09-15 MED ORDER — METOPROLOL TARTRATE 5 MG/5ML IV SOLN
5.0000 mg | Freq: Once | INTRAVENOUS | Status: AC
  Administered 2018-09-15: 5 mg via INTRAVENOUS
  Filled 2018-09-15: qty 5

## 2018-09-15 MED ORDER — INSULIN GLARGINE 100 UNIT/ML ~~LOC~~ SOLN
40.0000 [IU] | Freq: Every morning | SUBCUTANEOUS | Status: DC
  Administered 2018-09-16 – 2018-09-17 (×2): 40 [IU] via SUBCUTANEOUS
  Filled 2018-09-15 (×4): qty 0.4

## 2018-09-15 MED ORDER — DULOXETINE HCL 30 MG PO CPEP
30.0000 mg | ORAL_CAPSULE | Freq: Every day | ORAL | Status: DC
  Administered 2018-09-15 – 2018-09-18 (×4): 30 mg via ORAL
  Filled 2018-09-15 (×4): qty 1

## 2018-09-15 MED ORDER — SODIUM CHLORIDE 0.9 % IV SOLN
INTRAVENOUS | Status: DC
  Administered 2018-09-15 – 2018-09-16 (×3): via INTRAVENOUS

## 2018-09-15 MED ORDER — RIVAROXABAN 20 MG PO TABS
20.0000 mg | ORAL_TABLET | Freq: Every day | ORAL | Status: DC
  Administered 2018-09-16: 20 mg via ORAL
  Filled 2018-09-15: qty 1

## 2018-09-15 MED ORDER — PRAVASTATIN SODIUM 20 MG PO TABS
10.0000 mg | ORAL_TABLET | Freq: Every evening | ORAL | Status: DC
  Administered 2018-09-15 – 2018-09-17 (×3): 10 mg via ORAL
  Filled 2018-09-15 (×3): qty 1

## 2018-09-15 MED ORDER — NITROGLYCERIN 0.4 MG SL SUBL: 0.4000 mg | SUBLINGUAL_TABLET | SUBLINGUAL | Status: DC | PRN

## 2018-09-15 MED ORDER — METOPROLOL TARTRATE 25 MG PO TABS
25.0000 mg | ORAL_TABLET | Freq: Two times a day (BID) | ORAL | Status: DC
  Administered 2018-09-15 – 2018-09-16 (×3): 25 mg via ORAL
  Filled 2018-09-15 (×3): qty 1

## 2018-09-15 MED ORDER — METOPROLOL TARTRATE 25 MG PO TABS
25.0000 mg | ORAL_TABLET | Freq: Once | ORAL | Status: AC
  Administered 2018-09-15: 25 mg via ORAL
  Filled 2018-09-15: qty 1

## 2018-09-15 MED ORDER — METOPROLOL SUCCINATE ER 25 MG PO TB24
12.5000 mg | ORAL_TABLET | Freq: Once | ORAL | Status: AC
  Administered 2018-09-15: 12.5 mg via ORAL
  Filled 2018-09-15: qty 1

## 2018-09-15 MED ORDER — HYDRALAZINE HCL 50 MG PO TABS
50.0000 mg | ORAL_TABLET | Freq: Two times a day (BID) | ORAL | Status: DC
  Filled 2018-09-15 (×5): qty 1

## 2018-09-15 NOTE — Telephone Encounter (Signed)
Spoke with pt who states she went to the ED yesterday and was dx with a UTI. She also report that she required several dose of IV metoprolol due to rapid heart rate. She reports today her HR was 149 while laying and can feel that its still rapid. BP 111/77. Pt advised that she need to report back to the ED due to the rapid heart rhythm. Pt states she really doesn't want to sit at the ER all day again today. Pt instructed that its urgent that she be evaluated by a medical professional as her rate is too high. Pt voiced understanding but continue to say she may or may not go.

## 2018-09-15 NOTE — ED Provider Notes (Addendum)
[facility] DEPT Provider Note   CSN: [id number] Arrival date & time: 09/15/18  1202     History   Chief Complaint Chief Complaint  Patient presents with  . Weakness    HPI Kathleen Griffith is a 77 y.o. female.  She was seen yesterday for intermittent fevers and generalized weakness for about 4 or 5 days prior.  She has some chronic back pain that did not seem any worse.  She had a decreased appetite and some nausea.  A little bit of burning after urination.  Yesterday she was found to have a UTI and was also in a rapid A. fib.  She has paroxysmal A. fib and on anticoagulation.  She ultimately felt better and was given an IV dose of ceftriaxone and sent out on p.o. Keflex.  She received a phone call today that her blood culture was positive and that she needed to come back to the hospital.  She is actually feeling a little bit better than yesterday has a little bit of an appetite.  Otherwise no change in her symptoms.  She did not take her morning medications and is tachycardic here.  The history is provided by the patient.  Dysuria   This is a new problem. The current episode started more than 2 days ago. The problem occurs intermittently. The problem has not changed since onset.The quality of the pain is described as burning. The patient is experiencing no pain. The maximum temperature recorded prior to her arrival was 102 to 102.9 F. Associated symptoms include chills, sweats and nausea. Pertinent negatives include no vomiting, no discharge, no frequency, no hematuria, no urgency and no flank pain. She has tried antibiotics for the symptoms.    Past Medical History:  Diagnosis Date  . Anemia    microcytic   . Arthritis   . Atrial fibrillation ([place])   . Cataract    REMOVED BILATERAL  . Colon polyps   . Diabetes mellitus ([place])   . Fibromyalgia   . Gastroparesis   . Hyperlipidemia   . Hypertension   . Kidney stones   . Melanoma ([place])   . PMR  (polymyalgia rheumatica) (HCC)   . PVD (peripheral vascular disease) [facility])     Patient Active Problem List   Diagnosis Date Noted  . Depression 12/03/2017  . Normal coronary arteries 12/03/2017  . 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 Polymyalgia rheumatica ([place]) 12/03/2017  . NSTEMI (non-ST elevated myocardial infarction) ([place]) 06/02/2017  . Fracture of rib of right side 03/20/2016  . Acute respiratory failure with hypoxia ([place]) 03/20/2016  . PAF (paroxysmal atrial fibrillation) ([place]) 03/20/2016  . Pneumothorax on right 03/20/2016  . ARDS (adult respiratory distress syndrome) ([place]) 03/20/2016  . Insulin dependent diabetes mellitus ([place]) 03/20/2016  . GERD (gastroesophageal reflux disease) 03/20/2016  . Dysphagia   . Abdominal pain, epigastric   . Gastric polyp   . Anemia, iron deficiency   . Angiodysplasia of cecum   . Colon polyp   . 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 Chronic anticoagulation 07/28/2015  . Family history of colon cancer 07/28/2015  . CN (constipation) 07/28/2015  . Paroxysmal atrial fibrillation ([place]) 04/04/2015  . Carotid artery disease ([place]) 04/04/2015  . Tobacco abuse 04/04/2015  . Essential hypertension 04/04/2015  . Hyperlipidemia 04/04/2015    Past Surgical History:  Procedure Laterality Date  . APPENDECTOMY    . BREAST BIOPSY    . CESAREAN SECTION    . COLONOSCOPY    . COLONOSCOPY WITH  PROPOFOL N/A 12/26/2015   Procedure: COLONOSCOPY WITH PROPOFOL;  Surgeon: Manus Gunning, MD;  Location: [facility];  Service: Gastroenterology;  Laterality: N/A;  . ESOPHAGOGASTRODUODENOSCOPY (EGD) WITH PROPOFOL N/A 12/26/2015   Procedure: ESOPHAGOGASTRODUODENOSCOPY (EGD) WITH PROPOFOL;  Surgeon: Manus Gunning, MD;  [facility]: [facility];  Service: Gastroenterology;  Laterality: N/A;  . LEFT HEART CATH AND CORONARY ANGIOGRAPHY N/A 06/03/2017   Procedure: Left Heart Cath and Coronary Angiography;  Surgeon: Martinique, Peter M, MD;  Location: [facility];  Service: Cardiovascular;  Laterality: N/A;  . POLYPECTOMY       OB History   None      Home Medications    Prior to Admission medications   Medication Sig Start Date End Date Taking? Authorizing Provider  acetaminophen (TYLENOL) 650 MG CR tablet Take 1,300 mg by mouth every 8 (eight) hours as needed for pain.    [provider]  albuterol (PROVENTIL HFA;VENTOLIN HFA) 108 (90 Base) MCG/ACT inhaler Inhale 1 puff into the lungs every 6 (six) hours as needed for wheezing or shortness of breath.    [provider]  cephALEXin (KEFLEX) 500 MG capsule Take 1 capsule (500 mg total) by mouth 2 (two) times daily for 14 days. 09/14/18 09/28/18  Little, Wenda Overland, MD  Cholecalciferol (VITAMIN D3) 1000 UNITS CAPS Take 1 tablet by mouth daily.    [provider]  DULoxetine (CYMBALTA) 30 MG capsule Take 30 mg by mouth daily.     [provider]  furosemide (LASIX) 20 MG tablet Take 1 tablet (20 mg total) by mouth daily. 06/19/18   Lelon Perla, MD  hydrALAZINE (APRESOLINE) 50 MG tablet Take 1 tablet (50 mg total) by mouth 3 (three) times daily. Patient taking differently: Take 50 mg by mouth 2 (two) times daily.  07/13/18 10/11/18  Lelon Perla, MD  insulin glargine (LANTUS) 100 UNIT/ML injection Inject 0.15 mLs (15 Units total) into the skin at bedtime. Patient taking differently: Inject 40 Units into the skin every morning.  06/06/17   [name], MD  metoprolol tartrate (LOPRESSOR) 25 MG tablet Take 1 tablet (25 mg total) by mouth 2 (two) times daily. 03/27/18   Lelon Perla, MD  Multiple Vitamins-Minerals (CENTRUM SILVER PO) Take 1 tablet by mouth daily.    [provider]  nitroGLYCERIN (NITROSTAT) 0.3 MG SL tablet Place 1 tablet (0.3 mg total) under the tongue every 5 (five) minutes as needed for chest pain. 06/09/18   Lelon Perla, MD  pravastatin (PRAVACHOL) 10 MG tablet Take 10 mg by mouth every evening.      [provider]  XARELTO 20 MG TABS tablet TAKE 1 TABLET(20 MG) BY MOUTH DAILY WITH SUPPER Patient taking differently: Take 20 mg by mouth daily.  08/18/18   Lelon Perla, MD    Family History Family History  Problem Relation Age of Onset  . CAD Father        MI and CABG  . Diabetes Father   . Heart disease Father   . Colon cancer Mother 40    Social History Social History   Tobacco Use  . Smoking status: Current Every Day Smoker    Packs/day: 1.00    Types: Cigarettes  . Smokeless tobacco: Never Used  Substance Use Topics  . Alcohol use: No    Alcohol/week: 0.0 standard drinks  . Drug use: No     Allergies   Losartan; Pregabalin; Bupropion; Metoclopramide; Lisinopril; Metformin; and Metformin and  related   Review of Systems Review of Systems  Constitutional: Positive for appetite change, chills, fatigue and fever.  HENT: Negative for sore throat.   Eyes: Negative for visual disturbance.  Respiratory: Negative for shortness of breath.   Cardiovascular: Negative for chest pain.  Gastrointestinal: Positive for nausea. Negative for abdominal pain and vomiting.  Genitourinary: Positive for dysuria. Negative for flank pain, frequency, hematuria and urgency.  Musculoskeletal: Positive for back pain. Negative for neck pain.  Skin: Negative for rash.  Neurological: Positive for weakness. Negative for headaches.     Physical Exam Updated Vital Signs BP 120/80 (BP Location: Right Arm)   Pulse (!) 133   Temp 97.9 F (36.6 C) (Oral)   Resp 20   Wt 72.1 kg   SpO2 100%   BMI 27.28 kg/m   Physical Exam  Constitutional: She appears well-developed and well-nourished. No distress.  HENT:  Head: Normocephalic and atraumatic.  Eyes: Conjunctivae are normal.  Neck: Neck supple.  Cardiovascular: An irregularly irregular rhythm present. Tachycardia present.  No murmur heard. Pulmonary/Chest: Effort normal and breath sounds normal. No respiratory distress.    Abdominal: Soft. There is no tenderness.  Musculoskeletal: She exhibits no edema, tenderness or deformity.  Neurological: She is alert.  Skin: Skin is warm and dry.  Psychiatric: She has a normal mood and affect.  Nursing note and vitals reviewed.    ED Treatments / Results  Labs (all labs ordered are listed, but only abnormal results are displayed) Labs Reviewed  BASIC METABOLIC PANEL - Abnormal; Notable for the following components:      Result Value   Potassium 3.3 (*)    CO2 21 (*)    Glucose, Bld 102 (*)    BUN 28 (*)    Calcium 7.6 (*)    GFR calc non Af Amer 54 (*)    All other components within normal limits  CBC WITH DIFFERENTIAL/PLATELET - Abnormal; Notable for the following components:   Neutro Abs 8.0 (*)    Monocytes Absolute 1.2 (*)    All other components within normal limits  PROTIME-INR - Abnormal; Notable for the following components:   Prothrombin Time 16.1 (*)    All other components within normal limits  GLUCOSE, CAPILLARY - Abnormal; Notable for the following components:   Glucose-Capillary 125 (*)    All other components within normal limits  CULTURE, BLOOD (ROUTINE X 2)  CULTURE, BLOOD (ROUTINE X 2)  CBC WITH DIFFERENTIAL/PLATELET  COMPREHENSIVE METABOLIC PANEL  I-STAT TROPONIN, ED  I-STAT CG4 LACTIC ACID, ED    EKG EKG Interpretation  Date/Time:  Saturday August 15 2018 19:38:04 EDT Ventricular Rate:  78 PR Interval:    QRS Duration: 97 QT Interval:  377 QTC Calculation: 430 R Axis:   56 Text Interpretation:  Normal sinus rhythm Atrial premature complexes Confirmed by Pattricia Boss [phone]) on 08/15/2018 11:27:55 PM Also confirmed by Pattricia Boss [phone]), editor Philomena Doheny [phone])  on 09/15/2018 12:54:02 PM   Radiology Dg Chest 2 View  Result Date: 09/14/2018 CLINICAL DATA:  Fever EXAM: CHEST - 2 VIEW COMPARISON:  08/15/2018 FINDINGS: Peribronchial thickening. Heart is upper limits normal in size. No confluent airspace opacities or  effusions. No acute bony abnormality. IMPRESSION: Bronchitic changes. Electronically Signed   By: [name] M.D.   On: 09/14/2018 09:01    Procedures .Critical Care Performed by: Hayden Rasmussen, MD Authorized by: Hayden Rasmussen, MD   Critical care provider statement:    Critical care time (minutes):  30   Critical care was necessary to treat or prevent imminent or life-threatening deterioration of the following conditions:  Circulatory failure   Critical care was time spent personally by me on the following activities:  Discussions with consultants, evaluation of patient's response to treatment, examination of patient, ordering and performing treatments and interventions, ordering and review of laboratory studies, ordering and review of radiographic studies, pulse oximetry, re-evaluation of patient's condition, obtaining history from patient or surrogate, review of old charts and development of treatment plan with patient or surrogate   I assumed direction of critical care for this patient from another provider in my specialty: no     (including critical care time)  Medications Ordered in ED Medications  cefTRIAXone (ROCEPHIN) 1 g in sodium chloride 0.9 % 100 mL IVPB (has no administration in time range)  sodium chloride 0.9 % bolus 1,000 mL (has no administration in time range)  metoprolol tartrate (LOPRESSOR) tablet 25 mg (has no administration in time range)  metoprolol tartrate (LOPRESSOR) injection 5 mg (has no administration in time range)     Initial Impression / Assessment and Plan / ED Course  I have reviewed the triage vital signs and the nursing notes.  Pertinent labs & imaging results that were available during my care of the patient were reviewed by me and considered in my medical decision making (see chart for details).  Clinical Course as of Sep 15 1744  Tue Sep 15, 2018  1255 Patient was here after evaluation yesterday for positive blood cultures.  She is  positive for both Enterobacter and E. coli.  Sensitivities are not resulted.  She also has a history of chronic A. fib and did not take her morning medicines and is tachycardic 1 33-1 50s.  She is afebrile here.  She is getting IV fluids and antibiotics along with some medicine for rate control and repeat cultures and labs.   [MB]  7001 Discussed with hospitalist on-call who accepts patient for admission.  They asked if I would start her on an IV infusion of some normal saline at 75 which I have ordered.   [MB]    Clinical Course User Index [MB] Hayden Rasmussen, MD     Final Clinical Impressions(s) / ED Diagnoses   Final diagnoses:  Bacteremia  Acute pyelonephritis  Hypokalemia  Atrial fibrillation with rapid ventricular response [facility])    ED Discharge Orders    None       Hayden Rasmussen, MD 09/15/18 1746    Hayden Rasmussen, MD 09/15/18 [phone]

## 2018-09-15 NOTE — ED Notes (Signed)
ED Provider at bedside. 

## 2018-09-15 NOTE — ED Notes (Signed)
Hospitalist at bedside 

## 2018-09-15 NOTE — Telephone Encounter (Signed)
° ° ° ° °  Pt c/o medication issue:  1. Name of Medication:  hydrALAZINE (APRESOLINE) 50 MG tablet, hydrALAZINE (APRESOLINE) 50 MG tablet, furosemide (LASIX) 20 MG tablet  2. How are you currently taking this medication (dosage and times per day)? As written 3. Are you having a reaction (difficulty breathing--STAT)? No  4. What is your medication issue? Patient states she was in the ED on 9/17, has questions  about medications.

## 2018-09-15 NOTE — ED Triage Notes (Signed)
Pt arrives via EMS from home. Pt was seen yesterday for fever, back pain, weakness. Pt dx with UTI given IV abx. Pt was called today and told that blood cultures were positive and to return to ED. Pt reports increased weakness.

## 2018-09-15 NOTE — H&P (Signed)
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  Takotsubo-carotid Dopplers 18 benign ABIs 19 were negative Continue TOB, DM TY 2  Last admitted by me 06/06/2017 see  additional documentation from that time  States that she has not been feeling well since 09/11/2018 has been feeling weak and uncomfortable she also has had a fever at that time 102.4 went to an urgent care and was given Tylenol and sent home She returned to the emergency room on 9/16 because of persisting fevers and weakness as well as numbness in her feet she called the ambulance and was found to be in rapid A. fib at the time she was given metoprolol and treated accordingly given p.o. antibiotics Keflex 500 twice daily  She states that she woke up again this morning not feeling well--because of her urinary symptoms yesterday she was tested and it was found that she had bacteremia and hence she was called back to the emergency room for further management at the same    ED Course: Given 2 doses of metoprolol 1 IV because heart rate was 150 when she came and then started on her home meds of 25 metoprolol IV bolus 1 L given, ceftriaxone IV x1 given  Review of Systems:  + fever, no chills no vomiting Has endorsed some weakness of her legs and overall feeling weak Never has palpitations when she is in A. fib-did not know that she was having chest pain at the time of her NSTEMI   nno visual changes, sore throat, rash, new muscle aches, chest pain, SOB, dysuria, bleeding, n/v/abdominal pain.  Past Medical History:  Diagnosis Date  . Anemia    microcytic   . Arthritis   . Atrial fibrillation ([place])   .  Cataract    REMOVED BILATERAL  . Colon polyps   . Diabetes mellitus ([place])   . Fibromyalgia   . Gastroparesis   . Hyperlipidemia   . Hypertension   . Kidney stones   . Melanoma ([place])   . PMR (polymyalgia rheumatica) (HCC)   . PVD (peripheral vascular disease) ([place])     Past Surgical History:  Procedure Laterality Date  . APPENDECTOMY    . BREAST BIOPSY    . CESAREAN SECTION    . COLONOSCOPY    . COLONOSCOPY WITH PROPOFOL N/A 12/26/2015   Procedure: COLONOSCOPY WITH PROPOFOL;  Surgeon: Manus Gunning, MD;  [facility];  Service: Gastroenterology;  Laterality: N/A;  . ESOPHAGOGASTRODUODENOSCOPY (EGD) WITH PROPOFOL N/A 12/26/2015   Procedure: ESOPHAGOGASTRODUODENOSCOPY (EGD) WITH PROPOFOL;  Surgeon: Manus Gunning, MD;  Location: [facility];  Service: Gastroenterology;  Laterality: N/A;  . LEFT HEART CATH AND CORONARY ANGIOGRAPHY N/A 06/03/2017   Procedure: Left Heart Cath and Coronary Angiography;  Surgeon: Martinique, Peter M, MD;  Location: [facility];  Service: Cardiovascular;  Laterality: N/A;  . POLYPECTOMY       reports that she has been smoking cigarettes. She has been smoking about 1.00 pack per day. She has never used smokeless tobacco. She reports that she does not drink alcohol or use drugs. Mobility: Independent  Allergies  Allergen Reactions  . Anoro Ellipta [Umeclidinium-Vilanterol] Palpitations  . Losartan  Shortness Of Breath    wheezing wheezing  . Norvasc [Amlodipine Besylate] Swelling  . Pregabalin Swelling    TO LOWER EXTREMTIES TO LOWER EXTREMTIES  . Bupropion     Other reaction(s): Other Sucidal thoughts  . Metoclopramide Other (See Comments)    Tardive dyskinesia, tremors  . Lisinopril Cough  . Metformin Diarrhea  . Metformin And Related Diarrhea    Family History  Problem Relation Age of Onset  . CAD Father        MI and CABG  . Diabetes Father   . Heart disease Father   . Colon cancer Mother 75     Prior  to Admission medications   Medication Sig Start Date End Date Taking? Authorizing Provider  acetaminophen (TYLENOL) 650 MG CR tablet Take 1,300 mg by mouth every 8 (eight) hours as needed for pain.   Yes [provider]  albuterol (PROVENTIL HFA;VENTOLIN HFA) 108 (90 Base) MCG/ACT inhaler Inhale 1 puff into the lungs every 6 (six) hours as needed for wheezing or shortness of breath.   Yes [provider]  cephALEXin (KEFLEX) 500 MG capsule Take 1 capsule (500 mg total) by mouth 2 (two) times daily for 14 days. 09/14/18 09/28/18 Yes Little, Wenda Overland, MD  Cholecalciferol (VITAMIN D3) 1000 UNITS CAPS Take 1 tablet by mouth daily.   Yes [provider]  DULoxetine (CYMBALTA) 30 MG capsule Take 30 mg by mouth daily.    Yes [provider]  furosemide (LASIX) 20 MG tablet Take 1 tablet (20 mg total) by mouth daily. 06/19/18  Yes Lelon Perla, MD  insulin glargine (LANTUS) 100 UNIT/ML injection Inject 0.15 mLs (15 Units total) into the skin at bedtime. Patient taking differently: Inject 40 Units into the skin every morning.  06/06/17  Yes [name], MD  metoprolol tartrate (LOPRESSOR) 25 MG tablet Take 1 tablet (25 mg total) by mouth 2 (two) times daily. 03/27/18  Yes Lelon Perla, MD  Multiple Vitamins-Minerals (CENTRUM SILVER PO) Take 1 tablet by mouth daily.   Yes [provider]  pravastatin (PRAVACHOL) 10 MG tablet Take 10 mg by mouth every evening.    Yes [provider]  XARELTO 20 MG TABS tablet TAKE 1 TABLET(20 MG) BY MOUTH DAILY WITH SUPPER Patient taking differently: Take 20 mg by mouth daily.  08/18/18  Yes Lelon Perla, MD  hydrALAZINE (APRESOLINE) 50 MG tablet Take 1 tablet (50 mg total) by mouth 3 (three) times daily. Patient taking differently: Take 50 mg by mouth 2 (two) times daily.  07/13/18 10/11/18  Lelon Perla, MD  nitroGLYCERIN (NITROSTAT) 0.3 MG SL tablet Place 1 tablet (0.3 mg total) under the tongue  every 5 (five) minutes as needed for chest pain. 06/09/18   Lelon Perla, MD    Physical Exam:  Vitals:   09/15/18 1407 09/15/18 1428  BP:    Pulse:    Resp: 19 17  Temp:    SpO2:       Awake alert pleasant no distress comfortable appearing  EOMI NCAT moderate dentition  Chest is clinically clear no added sound  Abdomen soft nontender nondistended no rebound no guarding  Lower extremities are tender to touch in the shins but there is no overt swelling  Neurologically is intact without focal deficit  Power is 5/5  She has some tenderness in the lower quadrant but no CVA tenderness   I have personally reviewed following labs and imaging studies  Labs:   BUN/creatinine  28/0.99 down from yesterday 36/1.2  Potassium 3.3  Point-of-care troponin 0 0.05  Lactic acid 0.9  WBC 13.4 hemoglobin 13.0 platelet 250  Imaging studies:   None performed  Medical tests:   EKG independently reviewed: None  Test discussed with performing physician:  None  Decision to obtain old records:   Yes  Review and summation of old records:   Yes  Active Problems:   * No active hospital problems. *   Assessment/Plan Bacteremia with gram-negative rods E. coli enterococcus-needs IV antibiotics Paroxysmal A. 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 coli-agree with Rocephin-continue Rx for the same and narrow as appropriate  Rate controlled atrial fibrillation I will give another dose of 12.5 metoprolol now-her usual dosing at home is 25 twice daily, she may need to be on 37.5 twice daily-this is of course exacerbated by her anxiety-continue Xarelto 20 daily  HTN-holding hydralazine to allow for  rate control see above discussion regarding rationale  Recent CAD history-Takotsubo cardiomyopathy-continue meds as above Last echo showed systolic function improved to the 55-60 range  0.9 cm left kidney lesion-needs renal protocol MRI probably in December of this year  Diabetes mellitus type 2-on 40 units of long-acting-we will add sliding scale coverage-monitor  Lower extremity swelling-secondary to Norvasc which was discontinued by her cardiologist  Severity of Illness: The appropriate patient status for this patient is INPATIENT. Inpatient status is judged to be reasonable and necessary in order to provide the required intensity of service to ensure the patient's safety. The patient's presenting symptoms, physical exam findings, and initial radiographic and laboratory data in the context of their chronic comorbidities is felt to place them at high risk for further clinical deterioration. Furthermore, it is not anticipated that the patient will be medically stable for discharge from the hospital within 2 midnights of admission. The following factors support the patient status of inpatient.   " The patient's presenting symptoms include bacteremia. " The worrisome physical exam findings include none. " The initial radiographic and laboratory data are worrisome because of bacteremia. " The chronic co-morbidities include Takotsubo cardiomyopathy.   * I certify that at the point of admission it is my clinical judgment that the patient will require inpatient hospital care spanning beyond 2 midnights from the point of admission due to high intensity of service, high risk for further deterioration and high frequency of surveillance required.*     Inpatient, telemetry, discussed with son at the bedside, full code   Time spent: 45 minutes  Verlon Au, MD  [facility] Direct contact: [phone] --[place] [place]  --[email or web address]; password [email or web address]  7PM-7AM contact night coverage as  above  09/15/2018, 2:36 PM

## 2018-09-15 NOTE — ED Notes (Signed)
Bed: WA08 Expected date:  Expected time:  Means of arrival:  Comments: 77yo recently d/c- called back for sepsis

## 2018-09-15 NOTE — ED Notes (Signed)
Per EDP: Pt may eat and drink

## 2018-09-15 NOTE — ED Notes (Signed)
purewick placed on patient.  

## 2018-09-15 NOTE — ED Notes (Signed)
ED TO INPATIENT HANDOFF REPORT  Name/Age/Gender Kathleen Griffith 77 y.o. female  Code Status Code Status History    Date Active Date Inactive Code Status Order ID Comments User Context   06/02/2017 2043 06/06/2017 1852 Full Code [id number]  [name], MD Inpatient      Home/SNF/Other Home  Chief Complaint Weakness; Sepsis  Level of Care/Admitting Diagnosis ED Disposition    ED Disposition Condition Comment   Admit  Hospital Area: [facility] [[id number]]  Level of Care: Telemetry [5]  Admit to tele based on following criteria: Monitor for Ischemic changes  Diagnosis: Pyelonephritis [222979]  Admitting Physician: [name] [phone]  Attending Physician: [name] [phone]  Estimated length of stay: 3 - 4 days  Certification:: I certify this patient will need inpatient services for at least 2 midnights  PT Class (Do Not Modify): Inpatient [101]  PT Acc Code (Do Not Modify): Private [1]       Medical History Past Medical History:  Diagnosis Date  . Anemia    microcytic   . Arthritis   . Atrial fibrillation (Kathleen Griffith)   . Cataract    REMOVED BILATERAL  . Colon polyps   . Diabetes mellitus ([name])   . Fibromyalgia   . Gastroparesis   . Hyperlipidemia   . Hypertension   . Kidney stones   . Melanoma ([name])   . PMR (polymyalgia rheumatica) ([facility])   . PVD (peripheral vascular disease) ([facility])     Allergies Allergies  Allergen Reactions  . Anoro Ellipta [Umeclidinium-Vilanterol] Palpitations  . Losartan Shortness Of Breath    wheezing wheezing  . Norvasc [Amlodipine Besylate] Swelling  . Pregabalin Swelling    TO LOWER EXTREMTIES TO LOWER EXTREMTIES  . Bupropion     Other reaction(s): Other Sucidal thoughts  . Metoclopramide Other (See Comments)    Tardive dyskinesia, tremors  . Lisinopril Cough  . Metformin Diarrhea  . Metformin And Related Diarrhea    IV Location/Drains/Wounds Patient Lines/Drains/Airways Status   Active  Line/Drains/Airways    Name:   Placement date:   Placement time:   Site:   Days:   Peripheral IV 09/15/18 Left Antecubital   09/15/18    -    Antecubital   less than 1   External Urinary Catheter   08/15/18    2244    -   31          Labs/Imaging Results for orders placed or performed during the hospital encounter of 09/15/18 (from the past 48 hour(s))  Basic metabolic panel     Status: Abnormal   Collection Time: 09/15/18 12:52 PM  Result Value Ref Range   Sodium 140 135 - 145 mmol/L   Potassium 3.3 (L) 3.5 - 5.1 mmol/L   Chloride 106 98 - 111 mmol/L   CO2 21 (L) 22 - 32 mmol/L   Glucose, Bld 102 (H) 70 - 99 mg/dL   BUN 28 (H) 8 - 23 mg/dL   Creatinine, Ser 0.99 0.44 - 1.00 mg/dL   Calcium 7.6 (L) 8.9 - 10.3 mg/dL   GFR calc non Af Amer 54 (L) >60 mL/min   GFR calc Af Amer >60 >60 mL/min    Comment: (NOTE) The eGFR has been calculated using the CKD EPI equation. This calculation has not been validated in all clinical situations. eGFR's persistently <60 mL/min signify possible Chronic Kidney Disease.    Anion gap 13 5 - 15    Comment: Performed at [facility], [address] [place], [place] [address]  CBC with Differential     Status: Abnormal   Collection Time: 09/15/18 12:52 PM  Result Value Ref Range   WBC 10.5 4.0 - 10.5 K/uL   RBC 4.17 3.87 - 5.11 MIL/uL   Hemoglobin 13.0 12.0 - 15.0 g/dL   HCT 39.0 36.0 - 46.0 %   MCV 93.5 78.0 - 100.0 fL   MCH 31.2 26.0 - 34.0 pg   MCHC 33.3 30.0 - 36.0 g/dL   RDW 14.1 11.5 - 15.5 %   Platelets 250 150 - 400 K/uL   Neutrophils Relative % 75 %   Neutro Abs 8.0 (H) 1.7 - 7.7 K/uL   Lymphocytes Relative 12 %   Lymphs Abs 1.2 0.7 - 4.0 K/uL   Monocytes Relative 11 %   Monocytes Absolute 1.2 (H) 0.1 - 1.0 K/uL   Eosinophils Relative 1 %   Eosinophils Absolute 0.1 0.0 - 0.7 K/uL   Basophils Relative 1 %   Basophils Absolute 0.1 0.0 - 0.1 K/uL    Comment: Performed at [facility], [place]  [address], [place], [place] [address]  Protime-INR     Status: Abnormal   Collection Time: 09/15/18 12:52 PM  Result Value Ref Range   Prothrombin Time 16.1 (H) 11.4 - 15.2 seconds   INR 1.30     Comment: Performed at [facility], [place] [address] [place],  [address]  I-stat troponin, ED     Status: None   Collection Time: 09/15/18  1:12 PM  Result Value Ref Range   Troponin i, poc 0.05 0.00 - 0.08 ng/mL   Comment 3            Comment: Due to the release kinetics of cTnI, a negative result within the first hours of the onset of symptoms does not rule out myocardial infarction with certainty. If myocardial infarction is still suspected, repeat the test at appropriate intervals.   I-Stat CG4 Lactic Acid, ED     Status: None   Collection Time: 09/15/18  1:14 PM  Result Value Ref Range   Lactic Acid, Venous 0.93 0.5 - 1.9 mmol/L   Dg Chest 2 View  Result Date: 09/14/2018 CLINICAL DATA:  Fever EXAM: CHEST - 2 VIEW COMPARISON:  08/15/2018 FINDINGS: Peribronchial thickening. Heart is upper limits normal in size. No confluent airspace opacities or effusions. No acute bony abnormality. IMPRESSION: Bronchitic changes. Electronically Signed   By: [name] M.D.   On: 09/14/2018 09:01    Pending Labs Unresulted Labs (From admission, onward)    Start     Ordered   09/15/18 1252  Culture, blood (routine x 2)  BLOOD CULTURE X 2,   STAT     09/15/18 1252          Vitals/Pain Today's Vitals   09/15/18 1348 09/15/18 1400 09/15/18 1407 09/15/18 1428  BP: 103/73 105/65    Pulse:  93    Resp: 18 16 19 17   Temp:      TempSrc:      SpO2:  99%    Weight:      PainSc:  0-No pain      Isolation Precautions No active isolations  Medications Medications  0.9 %  sodium chloride infusion (has no administration in time range)  cefTRIAXone (ROCEPHIN) 1 g in sodium chloride 0.9 % 100 mL IVPB (0 g Intravenous Stopped 09/15/18 1456)  sodium chloride 0.9 % bolus 1,000  mL (0 mLs Intravenous  Stopped 09/15/18 1411)  metoprolol tartrate (LOPRESSOR) tablet 25 mg (25 mg Oral Given 09/15/18 1308)  metoprolol tartrate (LOPRESSOR) injection 5 mg (5 mg Intravenous Given 09/15/18 1307)    Mobility walks

## 2018-09-15 NOTE — ED Notes (Signed)
Family at bedside. 

## 2018-09-16 ENCOUNTER — Encounter (HOSPITAL_COMMUNITY): Payer: Medicare Other

## 2018-09-16 DIAGNOSIS — R7881 Bacteremia: Secondary | ICD-10-CM

## 2018-09-16 DIAGNOSIS — N12 Tubulo-interstitial nephritis, not specified as acute or chronic: Secondary | ICD-10-CM

## 2018-09-16 DIAGNOSIS — I4891 Unspecified atrial fibrillation: Secondary | ICD-10-CM

## 2018-09-16 DIAGNOSIS — I10 Essential (primary) hypertension: Secondary | ICD-10-CM

## 2018-09-16 LAB — COMPREHENSIVE METABOLIC PANEL
ALBUMIN: 2.4 g/dL — AB (ref 3.5–5.0)
ALT: 20 U/L (ref 0–44)
ANION GAP: 10 (ref 5–15)
AST: 22 U/L (ref 15–41)
Alkaline Phosphatase: 68 U/L (ref 38–126)
BILIRUBIN TOTAL: 0.7 mg/dL (ref 0.3–1.2)
BUN: 22 mg/dL (ref 8–23)
CHLORIDE: 109 mmol/L (ref 98–111)
CO2: 21 mmol/L — ABNORMAL LOW (ref 22–32)
Calcium: 7.5 mg/dL — ABNORMAL LOW (ref 8.9–10.3)
Creatinine, Ser: 0.88 mg/dL (ref 0.44–1.00)
GFR calc Af Amer: >60 mL/min (ref >60)
Glucose, Bld: 123 mg/dL — ABNORMAL HIGH (ref 70–99)
POTASSIUM: 3.6 mmol/L (ref 3.5–5.1)
Sodium: 140 mmol/L (ref 135–145)
Total Protein: 5.7 g/dL — ABNORMAL LOW (ref 6.5–8.1)

## 2018-09-16 LAB — CBC WITH DIFFERENTIAL/PLATELET
Basophils Absolute: 0.1 10*3/uL (ref 0.0–0.1)
Basophils Relative: 1 %
EOS ABS: 0.3 10*3/uL (ref 0.0–0.7)
EOS PCT: 2 %
HEMATOCRIT: 37.3 % (ref 36.0–46.0)
Hemoglobin: 12.5 g/dL (ref 12.0–15.0)
Lymphocytes Relative: 18 %
Lymphs Abs: 1.9 10*3/uL (ref 0.7–4.0)
MCH: 31.1 pg (ref 26.0–34.0)
MCHC: 33.5 g/dL (ref 30.0–36.0)
MCV: 92.8 fL (ref 78.0–100.0)
MONO ABS: 1.1 10*3/uL — AB (ref 0.1–1.0)
MONOS PCT: 10 %
NEUTROS ABS: 7.1 10*3/uL (ref 1.7–7.7)
Neutrophils Relative %: 69 %
PLATELETS: 269 10*3/uL (ref 150–400)
RBC: 4.02 MIL/uL (ref 3.87–5.11)
RDW: 14.1 % (ref 11.5–15.5)
WBC: 10.4 10*3/uL (ref 4.0–10.5)

## 2018-09-16 LAB — URINE CULTURE

## 2018-09-16 LAB — GLUCOSE, CAPILLARY
GLUCOSE-CAPILLARY: 88 mg/dL (ref 70–99)
GLUCOSE-CAPILLARY: 177 mg/dL — AB (ref 70–99)
Glucose-Capillary: 195 mg/dL — ABNORMAL HIGH (ref 70–99)

## 2018-09-16 MED ORDER — SODIUM CHLORIDE 0.9 % IV SOLN
2.0000 g | INTRAVENOUS | Status: DC
  Administered 2018-09-16 – 2018-09-17 (×2): 2 g via INTRAVENOUS
  Filled 2018-09-16: qty 20
  Filled 2018-09-16 (×2): qty 2

## 2018-09-16 MED ORDER — ACETAMINOPHEN 325 MG PO TABS
650.0000 mg | ORAL_TABLET | Freq: Four times a day (QID) | ORAL | Status: DC | PRN
  Administered 2018-09-16 – 2018-09-18 (×6): 650 mg via ORAL
  Filled 2018-09-16 (×6): qty 2

## 2018-09-16 MED ORDER — METOPROLOL TARTRATE 25 MG PO TABS
25.0000 mg | ORAL_TABLET | Freq: Once | ORAL | Status: AC
  Administered 2018-09-16: 25 mg via ORAL
  Filled 2018-09-16: qty 1

## 2018-09-16 NOTE — Progress Notes (Signed)
HR ranged from 130's-170 with minimal activity of moving from the bed to the chair and sitting up in the chair for about 40 minutes while eating breakfast. HR continued to remain 150's after returning to bed. Patient denies chest pain, but is visually SOB and states that she can feel her heart racing. MD made aware during progression rounds and orders received to give an additional dose of Metoprolol 25mg  PO now. Will carry out orders and continue to monitor HR.   Kathleen Griffith, Fraser Din 09/16/2018

## 2018-09-16 NOTE — Progress Notes (Addendum)
PROGRESS NOTE    Kathleen Griffith  QIW:[id number] DOB: 04-03-1941 DOA: 09/15/2018 PCP: Bernerd Limbo, MD   Brief Narrative: Kathleen Griffith is a 77 y.o. female with a history of paroxysmal atrial fibrillation, NSTEMI, cardiomyopathy, diabetes mellitus, hypertension. She presented secondary to bacteremia and was found to have atrial fibrillation with RVR.   Assessment & Plan:   Active Problems:   Pyelonephritis   Bacteremia   Pyelonephritis Urine and blood cultures significant for pan-sensitive e. Coli. -Continue ceftriaxone  Bacteremia E. Coli. Repeat blood cultures pending. -Management above  Atrial fibrillation with RVR Patient with paroxysmal atrial fibrillation as an outpatient. On metoprolol as an outpatient. On Xarelto -Continue metoprolol 25 mg BID -Give another dose of metoprolol 25 mg this morning and watch response. May need a dose of IV metoprolol. Will consult cardiology if she will likely need secondary agent as she sees Dr. Stanford Breed as an outpatient.  Essential hypertension On metoprolol and hydralazine as an outpatient -Continue metoprolol -Hold hydralazine  Diabetes mellitus, type 2 -Continue Lantus 40 units daily and SSI -Continue Novolog 3 units TID WC  Left kidney lesion Outpatient follow-up  History of Takotsubo cardiomyopathy History of NSTEMI   DVT prophylaxis: Xarelto Code Status:   Code Status: Full Code Family Communication: None at bedside Disposition Plan: Discharge likely in 24 hours if heart rate controlled   Consultants:   None  Procedures:   None  Antimicrobials:  Ceftriaxone    Subjective: No issues overnight.  Objective: Vitals:   09/15/18 1554 09/15/18 2132 09/16/18 0457 09/16/18 1041  BP: 122/79 118/79 117/84 129/89  Pulse: 97 (!) 127 90 (!) 145  Resp: 18 16 16    Temp: 98.9 F (37.2 C) 98.8 F (37.1 C) 98.5 F (36.9 C)   TempSrc: Oral Oral Oral   SpO2: 96% 98% 94%   Weight:      Height:         Intake/Output Summary (Last 24 hours) at 09/16/2018 1337 Last data filed at 09/16/2018 0506 Gross per 24 hour  Intake 1386.93 ml  Output 700 ml  Net 686.93 ml   Filed Weights   09/15/18 1216 09/15/18 1546  Weight: 72.1 kg 79 kg    Examination:  General exam: Appears calm and comfortable Respiratory system: Clear to auscultation. Respiratory effort normal. Cardiovascular system: S1 & S2 heard, Fast rate with irregular rhythm. No murmurs, rubs, gallops or clicks. Gastrointestinal system: Abdomen is nondistended, soft and nontender. No organomegaly or masses felt. Normal bowel sounds heard. Central nervous system: Alert and oriented. No focal neurological deficits. Extremities: No edema. No calf tenderness Skin: No cyanosis. No rashes Psychiatry: Judgement and insight appear normal. Mood & affect appropriate.     Data Reviewed: I have personally reviewed following labs and imaging studies  CBC: Recent Labs  Lab 09/14/18 0837 09/15/18 1252 09/16/18 0407  WBC 13.4* 10.5 10.4  NEUTROABS 11.3* 8.0* 7.1  HGB 13.2 13.0 12.5  HCT 39.6 39.0 37.3  MCV 92.5 93.5 92.8  PLT 256 250 417   Basic Metabolic Panel: Recent Labs  Lab 09/11/18 2018 09/14/18 0837 09/15/18 1252 09/16/18 0407  NA 137 136 140 140  K 3.6 3.8 3.3* 3.6  CL 102 101 106 109  CO2 25 24 21* 21*  GLUCOSE 87 123* 102* 123*  BUN 35* 36* 28* 22  CREATININE 1.14* 1.27* 0.99 0.88  CALCIUM 9.2 8.6* 7.6* 7.5*   GFR: Estimated Creatinine Clearance: 52.1 mL/min (by C-G formula based on SCr of 0.88 mg/dL). Liver Function Tests:  Recent Labs  Lab 09/11/18 2018 09/14/18 0837 09/16/18 0407  AST 20 20 22   ALT 19 18 20   ALKPHOS 62 73 68  BILITOT 0.7 1.0 0.7  PROT 7.0 7.0 5.7*  ALBUMIN 3.5 3.2* 2.4*   No results for input(s): LIPASE, AMYLASE in the last 168 hours. No results for input(s): AMMONIA in the last 168 hours. Coagulation Profile: Recent Labs  Lab 09/14/18 0837 09/15/18 1252  INR 2.23 1.30    Cardiac Enzymes: Recent Labs  Lab 09/14/18 0907  CKTOTAL 111   BNP (last 3 results) No results for input(s): PROBNP in the last 8760 hours. HbA1C: No results for input(s): HGBA1C in the last 72 hours. CBG: Recent Labs  Lab 09/14/18 0901 09/15/18 1656 09/15/18 2126 09/16/18 0750 09/16/18 1230  GLUCAP 105* 125* 89 88 195*   Lipid Profile: No results for input(s): CHOL, HDL, LDLCALC, TRIG, CHOLHDL, LDLDIRECT in the last 72 hours. Thyroid Function Tests: No results for input(s): TSH, T4TOTAL, FREET4, T3FREE, THYROIDAB in the last 72 hours. Anemia Panel: No results for input(s): VITAMINB12, FOLATE, FERRITIN, TIBC, IRON, RETICCTPCT in the last 72 hours. Sepsis Labs: Recent Labs  Lab 09/14/18 0837 09/15/18 1314  LATICACIDVEN 0.84 0.93    Recent Results (from the past 240 hour(s))  Culture, blood (Routine x 2)     Status: None (Preliminary result)   Collection Time: 09/14/18  8:37 AM  Result Value Ref Range Status   Specimen Description   Final    BLOOD LEFT ANTECUBITAL Performed at [facility] [address], [place], [place] [address]    Special Requests   Final    BOTTLES DRAWN AEROBIC AND ANAEROBIC Blood Culture adequate volume Performed at [facility] [address] [place], [place] [address]    Culture   Final    NO GROWTH 2 DAYS Performed at [facility] [address], [place], [place] [address]    Report Status PENDING  Incomplete  Culture, blood (Routine x 2)     Status: Abnormal (Preliminary result)   Collection Time: 09/14/18  9:05 AM  Result Value Ref Range Status   Specimen Description   Final    BLOOD LEFT ANTECUBITAL Performed at [facility] [address], [place], [place] [address]    Special Requests   Final    BOTTLES DRAWN AEROBIC AND ANAEROBIC Blood Culture adequate volume Performed at [facility] [address] [place], [place] [address]     Culture  Setup Time   Final    GRAM NEGATIVE RODS ANAEROBIC BOTTLE ONLY CRITICAL RESULT CALLED TO, READ BACK BY AND VERIFIED WITH: Casandra Doffing RN 2440 09/15/18 A BROWNING    Culture (A)  Final    ESCHERICHIA COLI SUSCEPTIBILITIES TO FOLLOW Performed at [facility], [place] [address] [place], [place] [address]    Report Status PENDING  Incomplete  Blood Culture ID Panel (Reflexed)     Status: Abnormal   Collection Time: 09/14/18  9:05 AM  Result Value Ref Range Status   Enterococcus species NOT DETECTED NOT DETECTED Final   Listeria monocytogenes NOT DETECTED NOT DETECTED Final   Staphylococcus species NOT DETECTED NOT DETECTED Final   Staphylococcus aureus NOT DETECTED NOT DETECTED Final   Streptococcus species NOT DETECTED NOT DETECTED Final   Streptococcus agalactiae NOT DETECTED NOT DETECTED Final   Streptococcus pneumoniae NOT DETECTED NOT DETECTED Final   Streptococcus pyogenes NOT DETECTED NOT DETECTED Final   Acinetobacter  baumannii NOT DETECTED NOT DETECTED Final   Enterobacteriaceae species DETECTED (A) NOT DETECTED Final    Comment: Enterobacteriaceae represent a large family of gram-negative bacteria, not a single organism. CRITICAL RESULT CALLED TO, READ BACK BY AND VERIFIED WITH: Casandra Doffing RN 9518 09/15/18 A BROWNING    Enterobacter cloacae complex NOT DETECTED NOT DETECTED Final   Escherichia coli DETECTED (A) NOT DETECTED Final    Comment: CRITICAL RESULT CALLED TO, READ BACK BY AND VERIFIED WITH: Casandra Doffing RN [phone] 09/15/18 A BROWNING    Klebsiella oxytoca NOT DETECTED NOT DETECTED Final   Klebsiella pneumoniae NOT DETECTED NOT DETECTED Final   Proteus species NOT DETECTED NOT DETECTED Final   Serratia marcescens NOT DETECTED NOT DETECTED Final   Carbapenem resistance NOT DETECTED NOT DETECTED Final   Haemophilus influenzae NOT DETECTED NOT DETECTED Final   Neisseria meningitidis NOT DETECTED NOT DETECTED Final   Pseudomonas aeruginosa NOT DETECTED NOT DETECTED Final    Candida albicans NOT DETECTED NOT DETECTED Final   Candida glabrata NOT DETECTED NOT DETECTED Final   Candida krusei NOT DETECTED NOT DETECTED Final   Candida parapsilosis NOT DETECTED NOT DETECTED Final   Candida tropicalis NOT DETECTED NOT DETECTED Final    Comment: Performed at [facility], [place] [address] [place], [place] [address]  Urine culture     Status: Abnormal   Collection Time: 09/14/18 10:08 AM  Result Value Ref Range Status   Specimen Description   Final    URINE, CATHETERIZED Performed at [facility] [address] [place], [place] [address]    Special Requests   Final    NONE Performed at [facility], [place] [address], [place], [place] [address]    Culture >=100,000 COLONIES/mL ESCHERICHIA COLI (A)  Final   Report Status 09/16/2018 FINAL  Final   Organism ID, Bacteria ESCHERICHIA COLI (A)  Final      Susceptibility   Escherichia coli - MIC*    AMPICILLIN <=2 SENSITIVE Sensitive     CEFAZOLIN <=4 SENSITIVE Sensitive     CEFTRIAXONE <=1 SENSITIVE Sensitive     CIPROFLOXACIN <=0.25 SENSITIVE Sensitive     GENTAMICIN <=1 SENSITIVE Sensitive     IMIPENEM <=0.25 SENSITIVE Sensitive     NITROFURANTOIN <=16 SENSITIVE Sensitive     TRIMETH/SULFA <=20 SENSITIVE Sensitive     AMPICILLIN/SULBACTAM <=2 SENSITIVE Sensitive     PIP/TAZO <=4 SENSITIVE Sensitive     Extended ESBL NEGATIVE Sensitive     * >=100,000 COLONIES/mL ESCHERICHIA COLI  Culture, blood (routine x 2)     Status: None (Preliminary result)   Collection Time: 09/15/18  1:00 PM  Result Value Ref Range Status   Specimen Description   Final    BLOOD LEFT ANTECUBITAL Performed at [facility] [address] [place], [place] [address]    Special Requests   Final    BOTTLES DRAWN AEROBIC AND ANAEROBIC Blood Culture adequate volume Performed at [facility] [address] [place], [place] [address]    Culture   Final    NO  GROWTH < 24 HOURS Performed at [facility] [address], [place], [place] [address]    Report Status PENDING  Incomplete  Culture, blood (routine x 2)     Status: None (Preliminary result)   Collection Time: 09/15/18  1:59 PM  Result Value Ref Range Status   Specimen Description   Final    BLOOD RIGHT ANTECUBITAL Performed  at [facility], [place] [address] [place], [place] [address]    Special Requests   Final    BOTTLES DRAWN AEROBIC AND ANAEROBIC Blood Culture results may not be optimal due to an excessive volume of blood received in culture bottles Performed at [facility] [address], [place], [place] [address]    Culture   Final    NO GROWTH < 24 HOURS Performed at [facility] [address], [place], [place] [address]    Report Status PENDING  Incomplete         Radiology Studies: No results found.      Scheduled Meds: . DULoxetine  30 mg Oral Daily  . hydrALAZINE  50 mg Oral BID  . insulin aspart  0-9 Units Subcutaneous TID WC  . insulin aspart  3 Units Subcutaneous TID WC  . insulin glargine  40 Units Subcutaneous q morning - 10a  . metoprolol tartrate  25 mg Oral BID  . pravastatin  10 mg Oral QPM  . rivaroxaban  20 mg Oral Daily   Continuous Infusions: . sodium chloride 75 mL/hr at 09/16/18 0705  . cefTRIAXone (ROCEPHIN)  IV       LOS: 1 day     Cordelia Poche, MD [facility] 09/16/2018, 1:37 PM  If 7PM-7AM, please contact night-coverage [email or web address] 09/16/2018, 1:37 PM

## 2018-09-17 ENCOUNTER — Telehealth: Payer: Self-pay | Admitting: Emergency Medicine

## 2018-09-17 DIAGNOSIS — I480 Paroxysmal atrial fibrillation: Secondary | ICD-10-CM

## 2018-09-17 LAB — CULTURE, BLOOD (ROUTINE X 2): Special Requests: ADEQUATE

## 2018-09-17 LAB — GLUCOSE, CAPILLARY
GLUCOSE-CAPILLARY: 146 mg/dL — AB (ref 70–99)
GLUCOSE-CAPILLARY: 113 mg/dL — AB (ref 70–99)
Glucose-Capillary: 135 mg/dL — ABNORMAL HIGH (ref 70–99)
Glucose-Capillary: 147 mg/dL — ABNORMAL HIGH (ref 70–99)
Glucose-Capillary: 123 mg/dL — ABNORMAL HIGH (ref 70–99)

## 2018-09-17 MED ORDER — METOPROLOL TARTRATE 50 MG PO TABS
50.0000 mg | ORAL_TABLET | Freq: Two times a day (BID) | ORAL | Status: DC
  Administered 2018-09-17 – 2018-09-18 (×3): 50 mg via ORAL
  Filled 2018-09-17 (×3): qty 1

## 2018-09-17 MED ORDER — METOPROLOL TARTRATE 50 MG PO TABS: 50.0000 mg | ORAL_TABLET | Freq: Two times a day (BID) | ORAL | Status: DC

## 2018-09-17 MED ORDER — RIVAROXABAN 20 MG PO TABS
20.0000 mg | ORAL_TABLET | Freq: Every day | ORAL | Status: DC
  Administered 2018-09-17: 20 mg via ORAL
  Filled 2018-09-17: qty 1

## 2018-09-17 NOTE — Telephone Encounter (Signed)
Post ED Visit - Positive Culture Follow-up  Culture report reviewed by antimicrobial stewardship pharmacist:  []  Elenor Quinones, Pharm.D. []  Heide Guile, Pharm.D., BCPS AQ-ID []  Parks Neptune, Pharm.D., BCPS []  Alycia Rossetti, Pharm.D., BCPS []  [place], [place].D., BCPS, AAHIVP []  Legrand Como, Pharm.D., BCPS, AAHIVP []  Salome Arnt, PharmD, BCPS []  Johnnette Gourd, PharmD, BCPS []  Hughes Better, PharmD, BCPS [x]  [place] Levada Dy, PharmD  Positive urine culture Treated with Cephalexin, organism sensitive to the same and no further patient follow-up is required at this time.  Larene Beach Hence Derrick 09/17/2018, 4:07 PM

## 2018-09-17 NOTE — Consult Note (Signed)
Cardiology Consultation:   Patient ID: Alexsis Kathman MRN: [id number]; DOB: Sep 25, 1941  Admit date: 09/15/2018 Date of Consult: 09/17/2018  Primary Care Provider: Bernerd Limbo, MD Primary Cardiologist: Stanford Breed Primary Electrophysiologist:  None     Patient Profile:   Verlaine Embry is a 77 y.o. female with a hx of PAF, Takatsubo DCM, HTN  who is being seen today for the evaluation of PAF at the request of Dr Lonny Prude.  History of Present Illness:   Ms. 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 She is admitted with urosepsis. She has felt poorly for 2 weeks with fatigue , pre syncope culminated in fever, rigors. Admitted with enterococcal bacteremia. Has improved on antibiotics and with hydration. Last episode of PAF months ago. Did not really feel palpitations, dyspnea or chest pain Has been compliant with lopressor and xarelto with no missed doses. June 2018 takatsubo DCM with clean cath. EF subsequently returned to normal on TTE done 08/12/17  Currently feels much improved is in NSR on telemetry and wants to go home in am. Review of telemetry shows multiple episodes of PAF with rates as high as 140 range. No long pauses    Past Medical History:  Diagnosis Date  . Anemia    microcytic   . Arthritis   . Atrial fibrillation ([place])   . Cataract    REMOVED BILATERAL  . Colon polyps   . Diabetes mellitus ([place])   . Fibromyalgia   . Gastroparesis   . Hyperlipidemia   . Hypertension   . Kidney stones   . Melanoma ([place])   . PMR (polymyalgia rheumatica) ([facility])   . PVD (peripheral vascular disease) ([place])     Past Surgical History:  Procedure Laterality Date  . APPENDECTOMY    . BREAST BIOPSY    . CESAREAN SECTION    . COLONOSCOPY    . COLONOSCOPY WITH PROPOFOL N/A 12/26/2015   Procedure: COLONOSCOPY WITH PROPOFOL;  Surgeon: Manus Gunning, MD;  Location: [facility];  Service: Gastroenterology;  Laterality: N/A;  . ESOPHAGOGASTRODUODENOSCOPY (EGD) WITH PROPOFOL N/A  12/26/2015   Procedure: ESOPHAGOGASTRODUODENOSCOPY (EGD) WITH PROPOFOL;  Surgeon: Manus Gunning, MD;  Location: [facility];  Service: Gastroenterology;  Laterality: N/A;  . LEFT HEART CATH AND CORONARY ANGIOGRAPHY N/A 06/03/2017   Procedure: Left Heart Cath and Coronary Angiography;  Surgeon: Martinique, Jorey Dollard M, MD;  Location: [facility];  Service: Cardiovascular;  Laterality: N/A;  . POLYPECTOMY       Home Medications:  Prior to Admission medications   Medication Sig Start Date End Date Taking? Authorizing Provider  acetaminophen (TYLENOL) 650 MG CR tablet Take 1,300 mg by mouth every 8 (eight) hours as needed for pain.   Yes [provider]  albuterol (PROVENTIL HFA;VENTOLIN HFA) 108 (90 Base) MCG/ACT inhaler Inhale 1 puff into the lungs every 6 (six) hours as needed for wheezing or shortness of breath.   Yes [provider]  cephALEXin (KEFLEX) 500 MG capsule Take 1 capsule (500 mg total) by mouth 2 (two) times daily for 14 days. 09/14/18 09/28/18 Yes Little, Wenda Overland, MD  Cholecalciferol (VITAMIN D3) 1000 UNITS CAPS Take 1 tablet by mouth daily.   Yes [provider]  DULoxetine (CYMBALTA) 30 MG capsule Take 30 mg by mouth daily.    Yes [provider]  furosemide (LASIX) 20 MG tablet Take 1 tablet (20 mg total) by mouth daily. 06/19/18  Yes Lelon Perla, MD  insulin glargine (LANTUS) 100 UNIT/ML injection Inject 0.15 mLs (  15 Units total) into the skin at bedtime. Patient taking differently: Inject 40 Units into the skin every morning.  06/06/17  Yes [name], MD  metoprolol tartrate (LOPRESSOR) 25 MG tablet Take 1 tablet (25 mg total) by mouth 2 (two) times daily. 03/27/18  Yes Lelon Perla, MD  Multiple Vitamins-Minerals (CENTRUM SILVER PO) Take 1 tablet by mouth daily.   Yes [provider]  pravastatin (PRAVACHOL) 10 MG tablet Take 10 mg by mouth every evening.    Yes [provider]  XARELTO 20 MG  TABS tablet TAKE 1 TABLET(20 MG) BY MOUTH DAILY WITH SUPPER Patient taking differently: Take 20 mg by mouth daily.  08/18/18  Yes Lelon Perla, MD  hydrALAZINE (APRESOLINE) 50 MG tablet Take 1 tablet (50 mg total) by mouth 3 (three) times daily. Patient taking differently: Take 50 mg by mouth 2 (two) times daily.  07/13/18 10/11/18  Lelon Perla, MD  nitroGLYCERIN (NITROSTAT) 0.3 MG SL tablet Place 1 tablet (0.3 mg total) under the tongue every 5 (five) minutes as needed for chest pain. 06/09/18   Lelon Perla, MD    Inpatient Medications: Scheduled Meds: . DULoxetine  30 mg Oral Daily  . hydrALAZINE  50 mg Oral BID  . insulin aspart  0-9 Units Subcutaneous TID WC  . insulin aspart  3 Units Subcutaneous TID WC  . insulin glargine  40 Units Subcutaneous q morning - 10a  . metoprolol tartrate  50 mg Oral BID  . pravastatin  10 mg Oral QPM  . rivaroxaban  20 mg Oral Q supper   Continuous Infusions: . cefTRIAXone (ROCEPHIN)  IV 2 g (09/16/18 1339)   PRN Meds: acetaminophen, nitroGLYCERIN  Allergies:    Allergies  Allergen Reactions  . Anoro Ellipta [Umeclidinium-Vilanterol] Palpitations  . Losartan Shortness Of Breath    wheezing wheezing  . Norvasc [Amlodipine Besylate] Swelling  . Pregabalin Swelling    TO LOWER EXTREMTIES TO LOWER EXTREMTIES  . Bupropion     Other reaction(s): Other Sucidal thoughts  . Metoclopramide Other (See Comments)    Tardive dyskinesia, tremors  . Lisinopril Cough  . Metformin Diarrhea  . Metformin And Related Diarrhea    Social History:   Social History   Socioeconomic History  . Marital status: Widowed    Spouse name: Not on file  . Number of children: 1  . Years of education: Not on file  . Highest education level: Not on file  Occupational History  . Not on file  Social Needs  . Financial resource strain: Not on file  . Food insecurity:    Worry: Not on file    Inability: Not on file  . Transportation needs:     Medical: Not on file    Non-medical: Not on file  Tobacco Use  . Smoking status: Current Every Day Smoker    Packs/day: 1.00    Types: Cigarettes  . Smokeless tobacco: Never Used  Substance and Sexual Activity  . Alcohol use: No    Alcohol/week: 0.0 standard drinks  . Drug use: No  . Sexual activity: Not on file  Lifestyle  . Physical activity:    Days per week: Not on file    Minutes per session: Not on file  . Stress: Not on file  Relationships  . Social connections:    Talks on phone: Not on file    Gets together: Not on file    Attends religious service: Not on file  Active member of club or organization: Not on file    Attends meetings of clubs or organizations: Not on file    Relationship status: Not on file  . Intimate partner violence:    Fear of current or ex partner: Not on file    Emotionally abused: Not on file    Physically abused: Not on file    Forced sexual activity: Not on file  Other Topics Concern  . Not on file  Social History Narrative  . Not on file    Family History:    Family History  Problem Relation Age of Onset  . CAD Father        MI and CABG  . Diabetes Father   . Heart disease Father   . Colon cancer Mother 86     ROS:  Please see the history of present illness.   All other ROS reviewed and negative.     Physical Exam/Data:   Vitals:   09/16/18 1041 09/16/18 1430 09/16/18 2119 09/17/18 0611  BP: 129/89 129/73 114/68 114/74  Pulse: (!) 145 85 (!) 155 93  Resp:  18 16 16   Temp:  99 F (37.2 C) 98.8 F (37.1 C) 98.7 F (37.1 C)  TempSrc:  Oral Oral Oral  SpO2:  96% 99% 99%  Weight:      Height:        Intake/Output Summary (Last 24 hours) at 09/17/2018 1209 Last data filed at 09/17/2018 0907 Gross per 24 hour  Intake 2160 ml  Output 1300 ml  Net 860 ml   Filed Weights   09/15/18 1216 09/15/18 1546  Weight: 72.1 kg 79 kg   Body mass index is 31.85 kg/m.  General:  Well nourished, well developed, in no acute  distress  HEENT: normal Lymph: no adenopathy Neck: no JVD Endocrine:  No thryomegaly Vascular: No carotid bruits; FA pulses 2+ bilaterally without bruits  Cardiac:  normal S1, S2; RRR; no murmur   Lungs:  Basilar crackles clears with deep inspiration  Abd: soft, nontender, no hepatomegaly  Ext: Trace edema with discoloration LEls  Musculoskeletal:  No deformities, BUE and BLE strength normal and equal Skin: warm and dry  Neuro:  CNs 2-12 intact, no focal abnormalities noted Psych:  Normal affect   EKG:  The EKG was personally reviewed and demonstrates:  afib 129 bpm no acute ST changes normal QT  Telemetry:  Telemetry was personally reviewed and demonstrates:  Currently NSR rates 70 ;s   Relevant CV Studies: 8/18 EF 55-60% no significant valve disease LAD 34 mm  Laboratory Data:  Chemistry Recent Labs  Lab 09/14/18 0837 09/15/18 1252 09/16/18 0407  NA 136 140 140  K 3.8 3.3* 3.6  CL 101 106 109  CO2 24 21* 21*  GLUCOSE 123* 102* 123*  BUN 36* 28* 22  CREATININE 1.27* 0.99 0.88  CALCIUM 8.6* 7.6* 7.5*  GFRNONAA 40* 54* >60  GFRAA 46* >60 >60  ANIONGAP 11 13 10     Recent Labs  Lab 09/11/18 2018 09/14/18 0837 09/16/18 0407  PROT 7.0 7.0 5.7*  ALBUMIN 3.5 3.2* 2.4*  AST 20 20 22   ALT 19 18 20   ALKPHOS 62 73 68  BILITOT 0.7 1.0 0.7   Hematology Recent Labs  Lab 09/14/18 0837 09/15/18 1252 09/16/18 0407  WBC 13.4* 10.5 10.4  RBC 4.28 4.17 4.02  HGB 13.2 13.0 12.5  HCT 39.6 39.0 37.3  MCV 92.5 93.5 92.8  MCH 30.8 31.2 31.1  MCHC 33.3 33.3  33.5  RDW 14.0 14.1 14.1  PLT 256 250 269   Cardiac EnzymesNo results for input(s): TROPONINI in the last 168 hours.  Recent Labs  Lab 09/15/18 1312  TROPIPOC 0.05    BNPNo results for input(s): BNP, PROBNP in the last 168 hours.  DDimer No results for input(s): DDIMER in the last 168 hours.  Radiology/Studies:  Dg Chest 2 View  Result Date: 09/14/2018 CLINICAL DATA:  Fever EXAM: CHEST - 2 VIEW COMPARISON:   08/15/2018 FINDINGS: Peribronchial thickening. Heart is upper limits normal in size. No confluent airspace opacities or effusions. No acute bony abnormality. IMPRESSION: Bronchitic changes. Electronically Signed   By: [name] M.D.   On: 09/14/2018 09:01    Assessment and Plan:   1. PAF:  Likely ppt by urosepsis. In general has been stable and infrequent. Currently in NSR with Rx of infection Continue xarelto and beta blocker. If she has recurrence today or before d/c would start flecainide 75 mg bid since she has no CAD and EF has returned to normal. LA size 34 mm suggests that when she is not stressed with illness such as urosepsis she should maintain NSR 2. HTN;  Normal range she does not like hydralazine  3. Renal:  Needs f/u MRI for lesion in left kidney she is aware  4. HLD:  Continue statin  5. UTI:  Transition to PO antibiotics currently on iv rocephin       For questions or updates, please contact [facility] Please consult [email or web address] for contact info under     Signed, [name], MD  09/17/2018 12:09 PM

## 2018-09-17 NOTE — Plan of Care (Signed)
Patient converted to SR earlier in shift, maintained rate in the 60's and 70's.  Medicated for chronic pain in neck x 1 with improvement.  No other complaints.  Son at bedside.

## 2018-09-17 NOTE — Progress Notes (Addendum)
PROGRESS NOTE    Kathleen Griffith  SVX:[id number] DOB: 1941-05-15 DOA: 09/15/2018 PCP: Bernerd Limbo, MD   Brief Narrative: Kathleen Griffith is a 77 y.o. female with a history of paroxysmal atrial fibrillation, NSTEMI, cardiomyopathy, diabetes mellitus, hypertension. She presented secondary to bacteremia and was found to have atrial fibrillation with RVR.   Assessment & Plan:   Principal Problem:   Atrial fibrillation with RVR (HCC) Active Problems:   Paroxysmal atrial fibrillation ([facility])   Essential hypertension   Pyelonephritis   Bacteremia   Pyelonephritis Urine and blood cultures significant for pan-sensitive e. Coli. -Continue ceftriaxone  Bacteremia E. Coli. Repeat blood cultures no growth to date. Previous blood culture significant for pansensitive E. Coli. -Management above  Atrial fibrillation with RVR Patient with paroxysmal atrial fibrillation as an outpatient. On metoprolol as an outpatient. On Xarelto -Increase to metoprolol 50 mg BID -Will consult cardiology since patient is a primary patient of Dr. Stanford Breed  Essential hypertension On metoprolol and hydralazine as an outpatient -Continue metoprolol as above -Hold hydralazine  Diabetes mellitus, type 2 Last hemoglobin A1C of 6.6% -Continue Lantus 40 units daily and SSI -Continue Novolog 3 units TID WC  Left kidney lesion Outpatient follow-up  History of Takotsubo cardiomyopathy Chronic diastolic heart failure History of NSTEMI   DVT prophylaxis: Xarelto Code Status:   Code Status: Full Code Family Communication: None at bedside Disposition Plan: Discharge likely in 24 hours if heart rate controlled   Consultants:   Cardiology  Procedures:   None  Antimicrobials:  Ceftriaxone    Subjective: No concerns overnight. Significant tachycardia on reviewed telemetry.  Objective: Vitals:   09/16/18 1041 09/16/18 1430 09/16/18 2119 09/17/18 0611  BP: 129/89 129/73 114/68 114/74  Pulse: (!)  145 85 (!) 155 93  Resp:  18 16 16   Temp:  99 F (37.2 C) 98.8 F (37.1 C) 98.7 F (37.1 C)  TempSrc:  Oral Oral Oral  SpO2:  96% 99% 99%  Weight:      Height:        Intake/Output Summary (Last 24 hours) at 09/17/2018 1136 Last data filed at 09/17/2018 0907 Gross per 24 hour  Intake 2160 ml  Output 1300 ml  Net 860 ml   Filed Weights   09/15/18 1216 09/15/18 1546  Weight: 72.1 kg 79 kg    Examination:  General exam: Appears calm and comfortable Respiratory system: Clear to auscultation. Respiratory effort normal. Cardiovascular system: S1 & S2 heard, tachycardia, irregular rhythm. No murmurs, rubs, gallops or clicks. Gastrointestinal system: Abdomen is nondistended, soft and nontender. No organomegaly or masses felt. Normal bowel sounds heard. Central nervous system: Alert and oriented. No focal neurological deficits. Extremities: No edema. No calf tenderness Skin: No cyanosis. No rashes Psychiatry: Judgement and insight appear normal. Mood & affect appropriate.    Data Reviewed: I have personally reviewed following labs and imaging studies  CBC: Recent Labs  Lab 09/14/18 0837 09/15/18 1252 09/16/18 0407  WBC 13.4* 10.5 10.4  NEUTROABS 11.3* 8.0* 7.1  HGB 13.2 13.0 12.5  HCT 39.6 39.0 37.3  MCV 92.5 93.5 92.8  PLT 256 250 233   Basic Metabolic Panel: Recent Labs  Lab 09/11/18 2018 09/14/18 0837 09/15/18 1252 09/16/18 0407  NA 137 136 140 140  K 3.6 3.8 3.3* 3.6  CL 102 101 106 109  CO2 25 24 21* 21*  GLUCOSE 87 123* 102* 123*  BUN 35* 36* 28* 22  CREATININE 1.14* 1.27* 0.99 0.88  CALCIUM 9.2 8.6* 7.6* 7.5*  GFR: Estimated Creatinine Clearance: 52.1 mL/min (by C-G formula based on SCr of 0.88 mg/dL). Liver Function Tests: Recent Labs  Lab 09/11/18 2018 09/14/18 0837 09/16/18 0407  AST 20 20 22   ALT 19 18 20   ALKPHOS 62 73 68  BILITOT 0.7 1.0 0.7  PROT 7.0 7.0 5.7*  ALBUMIN 3.5 3.2* 2.4*   No results for input(s): LIPASE, AMYLASE in the  last 168 hours. No results for input(s): AMMONIA in the last 168 hours. Coagulation Profile: Recent Labs  Lab 09/14/18 0837 09/15/18 1252  INR 2.23 1.30   Cardiac Enzymes: Recent Labs  Lab 09/14/18 0907  CKTOTAL 111   BNP (last 3 results) No results for input(s): PROBNP in the last 8760 hours. HbA1C: No results for input(s): HGBA1C in the last 72 hours. CBG: Recent Labs  Lab 09/16/18 0750 09/16/18 1230 09/16/18 1712 09/16/18 2113 09/17/18 0722  GLUCAP 88 195* 146* 177* 135*   Lipid Profile: No results for input(s): CHOL, HDL, LDLCALC, TRIG, CHOLHDL, LDLDIRECT in the last 72 hours. Thyroid Function Tests: No results for input(s): TSH, T4TOTAL, FREET4, T3FREE, THYROIDAB in the last 72 hours. Anemia Panel: No results for input(s): VITAMINB12, FOLATE, FERRITIN, TIBC, IRON, RETICCTPCT in the last 72 hours. Sepsis Labs: Recent Labs  Lab 09/14/18 0837 09/15/18 1314  LATICACIDVEN 0.84 0.93    Recent Results (from the past 240 hour(s))  Culture, blood (Routine x 2)     Status: None (Preliminary result)   Collection Time: 09/14/18  8:37 AM  Result Value Ref Range Status   Specimen Description   Final    BLOOD LEFT ANTECUBITAL Performed at [facility] [address] [place], [place] [address]    Special Requests   Final    BOTTLES DRAWN AEROBIC AND ANAEROBIC Blood Culture adequate volume Performed at [facility] [address] [place], [place] [address]    Culture   Final    NO GROWTH 3 DAYS Performed at [facility], [place] [address] [place], [place] [address]    Report Status PENDING  Incomplete  Culture, blood (Routine x 2)     Status: Abnormal   Collection Time: 09/14/18  9:05 AM  Result Value Ref Range Status   Specimen Description   Final    BLOOD LEFT ANTECUBITAL Performed at [facility] [address], [place], [place] [address]    Special Requests   Final    BOTTLES DRAWN AEROBIC  AND ANAEROBIC Blood Culture adequate volume Performed at [facility] [address], [place], [place] [address]    Culture  Setup Time   Final    GRAM NEGATIVE RODS ANAEROBIC BOTTLE ONLY CRITICAL RESULT CALLED TO, READ BACK BY AND VERIFIED WITHCasandra Doffing RN [id number] 09/15/18 A BROWNING Performed at [facility], [place] [address] [place], [place] [address]    Culture ESCHERICHIA COLI (A)  Final   Report Status 09/17/2018 FINAL  Final   Organism ID, Bacteria ESCHERICHIA COLI  Final      Susceptibility   Escherichia coli - MIC*    AMPICILLIN <=2 SENSITIVE Sensitive     CEFAZOLIN <=4 SENSITIVE Sensitive     CEFEPIME <=1 SENSITIVE Sensitive     CEFTAZIDIME <=1 SENSITIVE Sensitive     CEFTRIAXONE <=1 SENSITIVE Sensitive     CIPROFLOXACIN <=0.25 SENSITIVE Sensitive     GENTAMICIN <=1 SENSITIVE Sensitive     IMIPENEM <=0.25 SENSITIVE Sensitive     TRIMETH/SULFA <=20 SENSITIVE  Sensitive     AMPICILLIN/SULBACTAM <=2 SENSITIVE Sensitive     PIP/TAZO <=4 SENSITIVE Sensitive     Extended ESBL NEGATIVE Sensitive     * ESCHERICHIA COLI  Blood Culture ID Panel (Reflexed)     Status: Abnormal   Collection Time: 09/14/18  9:05 AM  Result Value Ref Range Status   Enterococcus species NOT DETECTED NOT DETECTED Final   Listeria monocytogenes NOT DETECTED NOT DETECTED Final   Staphylococcus species NOT DETECTED NOT DETECTED Final   Staphylococcus aureus NOT DETECTED NOT DETECTED Final   Streptococcus species NOT DETECTED NOT DETECTED Final   Streptococcus agalactiae NOT DETECTED NOT DETECTED Final   Streptococcus pneumoniae NOT DETECTED NOT DETECTED Final   Streptococcus pyogenes NOT DETECTED NOT DETECTED Final   Acinetobacter baumannii NOT DETECTED NOT DETECTED Final   Enterobacteriaceae species DETECTED (A) NOT DETECTED Final    Comment: Enterobacteriaceae represent a large family of gram-negative bacteria, not a single organism. CRITICAL RESULT CALLED TO, READ BACK BY AND  VERIFIED WITH: Casandra Doffing RN [phone] 09/15/18 A BROWNING    Enterobacter cloacae complex NOT DETECTED NOT DETECTED Final   Escherichia coli DETECTED (A) NOT DETECTED Final    Comment: CRITICAL RESULT CALLED TO, READ BACK BY AND VERIFIED WITH: Casandra Doffing RN [phone] 09/15/18 A BROWNING    Klebsiella oxytoca NOT DETECTED NOT DETECTED Final   Klebsiella pneumoniae NOT DETECTED NOT DETECTED Final   Proteus species NOT DETECTED NOT DETECTED Final   Serratia marcescens NOT DETECTED NOT DETECTED Final   Carbapenem resistance NOT DETECTED NOT DETECTED Final   Haemophilus influenzae NOT DETECTED NOT DETECTED Final   Neisseria meningitidis NOT DETECTED NOT DETECTED Final   Pseudomonas aeruginosa NOT DETECTED NOT DETECTED Final   Candida albicans NOT DETECTED NOT DETECTED Final   Candida glabrata NOT DETECTED NOT DETECTED Final   Candida krusei NOT DETECTED NOT DETECTED Final   Candida parapsilosis NOT DETECTED NOT DETECTED Final   Candida tropicalis NOT DETECTED NOT DETECTED Final    Comment: Performed at [facility], [place] [address] [place], [place] [address]  Urine culture     Status: Abnormal   Collection Time: 09/14/18 10:08 AM  Result Value Ref Range Status   Specimen Description   Final    URINE, CATHETERIZED Performed at [facility] [address] [place], [place] [address]    Special Requests   Final    NONE Performed at [facility], [place] [address], [place], [place] [address]    Culture >=100,000 COLONIES/mL ESCHERICHIA COLI (A)  Final   Report Status 09/16/2018 FINAL  Final   Organism ID, Bacteria ESCHERICHIA COLI (A)  Final      Susceptibility   Escherichia coli - MIC*    AMPICILLIN <=2 SENSITIVE Sensitive     CEFAZOLIN <=4 SENSITIVE Sensitive     CEFTRIAXONE <=1 SENSITIVE Sensitive     CIPROFLOXACIN <=0.25 SENSITIVE Sensitive     GENTAMICIN <=1 SENSITIVE Sensitive     IMIPENEM <=0.25 SENSITIVE Sensitive     NITROFURANTOIN <=16 SENSITIVE  Sensitive     TRIMETH/SULFA <=20 SENSITIVE Sensitive     AMPICILLIN/SULBACTAM <=2 SENSITIVE Sensitive     PIP/TAZO <=4 SENSITIVE Sensitive     Extended ESBL NEGATIVE Sensitive     * >=100,000 COLONIES/mL ESCHERICHIA COLI  Culture, blood (routine x 2)     Status: None (Preliminary result)   Collection Time: 09/15/18  1:00 PM  Result Value Ref Range Status   Specimen Description  Final    BLOOD LEFT ANTECUBITAL Performed at [facility] [address], [place], [place] [address]    Special Requests   Final    BOTTLES DRAWN AEROBIC AND ANAEROBIC Blood Culture adequate volume Performed at [facility] [address], [place], [place] [address]    Culture   Final    NO GROWTH 2 DAYS Performed at [facility] [address] [place], [place] [address]    Report Status PENDING  Incomplete  Culture, blood (routine x 2)     Status: None (Preliminary result)   Collection Time: 09/15/18  1:59 PM  Result Value Ref Range Status   Specimen Description   Final    BLOOD RIGHT ANTECUBITAL Performed at [facility] [address], [place], [place] [address]    Special Requests   Final    BOTTLES DRAWN AEROBIC AND ANAEROBIC Blood Culture results may not be optimal due to an excessive volume of blood received in culture bottles Performed at [facility] [address], [place], [place] [address]    Culture   Final    NO GROWTH 2 DAYS Performed at [facility] [address], [place], [place] [address]    Report Status PENDING  Incomplete         Radiology Studies: No results found.      Scheduled Meds: . DULoxetine  30 mg Oral Daily  . hydrALAZINE  50 mg Oral BID  . insulin aspart  0-9 Units Subcutaneous TID WC  . insulin aspart  3 Units Subcutaneous TID WC  . insulin glargine  40 Units Subcutaneous q morning - 10a  . metoprolol tartrate  50 mg Oral BID  . pravastatin  10 mg Oral QPM    . rivaroxaban  20 mg Oral Q supper   Continuous Infusions: . sodium chloride 75 mL/hr at 09/16/18 2050  . cefTRIAXone (ROCEPHIN)  IV 2 g (09/16/18 1339)     LOS: 2 days     Cordelia Poche, MD [facility] 09/17/2018, 11:36 AM  If 7PM-7AM, please contact night-coverage [email or web address] 09/17/2018, 11:36 AM

## 2018-09-18 ENCOUNTER — Other Ambulatory Visit: Payer: Self-pay | Admitting: Cardiology

## 2018-09-18 DIAGNOSIS — I480 Paroxysmal atrial fibrillation: Secondary | ICD-10-CM

## 2018-09-18 LAB — GLUCOSE, CAPILLARY
GLUCOSE-CAPILLARY: 68 mg/dL — AB (ref 70–99)
GLUCOSE-CAPILLARY: 88 mg/dL (ref 70–99)

## 2018-09-18 MED ORDER — CEFDINIR 300 MG PO CAPS
300.0000 mg | ORAL_CAPSULE | Freq: Two times a day (BID) | ORAL | Status: DC
  Administered 2018-09-18: 300 mg via ORAL
  Filled 2018-09-18: qty 1

## 2018-09-18 MED ORDER — METOPROLOL TARTRATE 50 MG PO TABS: 50.0000 mg | ORAL_TABLET | Freq: Two times a day (BID) | ORAL | 0 refills | Status: DC

## 2018-09-18 MED ORDER — CEFDINIR 300 MG PO CAPS: 300.0000 mg | ORAL_CAPSULE | Freq: Two times a day (BID) | ORAL | 0 refills | Status: AC

## 2018-09-18 NOTE — Discharge Instructions (Signed)
Kathleen Griffith,  You were admitted because of bacteria in your blood and fast heart rate (atrial fibrillation with rapid ventricular rate). You will go home with Cefdinir (antibiotic). Please complete this prescription. With regard to your heart rate, you will go home on metoprolol with an increased dose of 50 mg twice daily. Please follow-up with Dr. Stanford Breed as you will need an Event Monitor. If you have significant fatigue or near passing out episodes, please call your cardiologist immediately and/or go to the emergency department.

## 2018-09-18 NOTE — Progress Notes (Addendum)
Progress Note  Patient Name: Kathleen Griffith Date of Encounter: 09/18/2018  Primary Cardiologist: Kirk Ruths, MD   Subjective   No complaints except back pain, she would like to go home.  Inpatient Medications    Scheduled Meds: . DULoxetine  30 mg Oral Daily  . hydrALAZINE  50 mg Oral BID  . insulin aspart  0-9 Units Subcutaneous TID WC  . insulin aspart  3 Units Subcutaneous TID WC  . insulin glargine  40 Units Subcutaneous q morning - 10a  . metoprolol tartrate  50 mg Oral BID  . pravastatin  10 mg Oral QPM  . rivaroxaban  20 mg Oral Q supper   Continuous Infusions: . cefTRIAXone (ROCEPHIN)  IV 2 g (09/17/18 1529)   PRN Meds: acetaminophen, nitroGLYCERIN   Vital Signs    Vitals:   09/17/18 0611 09/17/18 1231 09/17/18 2022 09/18/18 0439  BP: 114/74 123/85 128/68 (!) 147/67  Pulse: 93 64 61 62  Resp: 16 20 18 14   Temp: 98.7 F (37.1 C) 98.5 F (36.9 C) 98.1 F (36.7 C) (!) 97.4 F (36.3 C)  TempSrc: Oral Oral Oral Oral  SpO2: 99% 98% 98% 95%  Weight:      Height:        Intake/Output Summary (Last 24 hours) at 09/18/2018 0818 Last data filed at 09/17/2018 0907 Gross per 24 hour  Intake 200 ml  Output -  Net 200 ml   Filed Weights   09/15/18 1216 09/15/18 1546  Weight: 72.1 kg 79 kg    Telemetry    SR with PACs and PVCs and at night HR to 44 SB  - Personally Reviewed  ECG    No new - Personally Reviewed  Physical Exam   GEN: No acute distress.   Neck: No JVD Cardiac: RRR, no murmurs, rubs, or gallops.  Respiratory: occ wheeze to auscultation bilaterally. GI: Soft, nontender, non-distended  MS: No edema; No deformity. Neuro:  Nonfocal  Psych: Normal affect   Labs    Chemistry Recent Labs  Lab 09/11/18 2018 09/14/18 0837 09/15/18 1252 09/16/18 0407  NA 137 136 140 140  K 3.6 3.8 3.3* 3.6  CL 102 101 106 109  CO2 25 24 21* 21*  GLUCOSE 87 123* 102* 123*  BUN 35* 36* 28* 22  CREATININE 1.14* 1.27* 0.99 0.88  CALCIUM 9.2  8.6* 7.6* 7.5*  PROT 7.0 7.0  --  5.7*  ALBUMIN 3.5 3.2*  --  2.4*  AST 20 20  --  22  ALT 19 18  --  20  ALKPHOS 62 73  --  68  BILITOT 0.7 1.0  --  0.7  GFRNONAA 45* 40* 54* >60  GFRAA 52* 46* >60 >60  ANIONGAP 10 11 13 10      Hematology Recent Labs  Lab 09/14/18 0837 09/15/18 1252 09/16/18 0407  WBC 13.4* 10.5 10.4  RBC 4.28 4.17 4.02  HGB 13.2 13.0 12.5  HCT 39.6 39.0 37.3  MCV 92.5 93.5 92.8  MCH 30.8 31.2 31.1  MCHC 33.3 33.3 33.5  RDW 14.0 14.1 14.1  PLT 256 250 269    Cardiac EnzymesNo results for input(s): TROPONINI in the last 168 hours.  Recent Labs  Lab 09/15/18 1312  TROPIPOC 0.05     BNPNo results for input(s): BNP, PROBNP in the last 168 hours.   DDimer No results for input(s): DDIMER in the last 168 hours.   Radiology    No results found.  Cardiac Studies  Prior echo 08/12/17 Study Conclusions  - Left ventricle: The cavity size was normal. There was mild   concentric hypertrophy. Systolic function was normal. The   estimated ejection fraction was in the range of 55% to 60%. Wall   motion was normal; there were no regional wall motion   abnormalities. Doppler parameters are consistent with abnormal   left ventricular relaxation (grade 1 diastolic dysfunction).   Doppler parameters are consistent with indeterminate ventricular   filling pressure. - Aortic valve: Transvalvular velocity was within the normal range.   There was no stenosis. There was no regurgitation. - Mitral valve: Calcified annulus. Transvalvular velocity was   within the normal range. There was no evidence for stenosis.   There was no regurgitation. - Right ventricle: The cavity size was normal. Wall thickness was   normal. Systolic function was normal. - Atrial septum: No defect or patent foramen ovale was identified   by color flow Doppler. - Tricuspid valve: There was no regurgitation.  Impressions:  - Compared with the echo 07/07/23, systolic function has  improved.   Patient Profile     77 y.o. female with a hx of PAF, Takatsubo DCM, HTN  now admitted with urosepsis, enterococcal bacteremia  And multiple issues of PAF on tele.   Assessment & Plan    PAF with hx of PAF and now with urosepsis.  On Xarelto and BB. if recurrence flecainide would be option.  -- now with PACs and PVCs and sinus brady to 44.   HTN stable 128/68 to 147/67 on lopressor 50 BID, apresoline 50 BID   HLD on statin and continue   Urosepsis per IM and Lt kidney lesion.       For questions or updates, please contact [place] Please consult [email or web address] for contact info under        Signed, Cecilie Kicks, NP  09/18/2018, 8:18 AM    Patient examined chart reviewed In NSR on telemetry continue xarelto and beta blocker Ok to d/c home Will arrange 30 day event monitor and if PAF noted can start flecainide as outpatient   [name]

## 2018-09-18 NOTE — Discharge Summary (Signed)
Physician Discharge Summary  Kathleen Griffith LKG:[id number] DOB: 09-02-1941 DOA: 09/15/2018  PCP: Bernerd Limbo, MD  Admit date: 09/15/2018 Discharge date: 09/18/2018  Admitted From: Home Disposition: Home  Recommendations for Outpatient Follow-up:  1. Follow up with PCP in 1 week 2. Outpatient event monitor per cardiology 3. 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 fib on anticoagulation chads score = 4 nuclear stress test 2006 was negative COPD stage C and STEMI 12/2016 EF 66-44% grade 2 diastolic function severe elevated PA peak?  Takotsubo-carotid Dopplers 18 benign ABIs 19 were negative Continue TOB, DM TY 2  Last admitted by me 06/06/2017 see  additional documentation from that time  States that she has not been feeling well since 09/11/2018 has been feeling weak and uncomfortable she also has had a fever at that time 102.4 went to an urgent care and was given Tylenol and sent home She returned to the emergency room on 9/16 because of persisting fevers and weakness as well as numbness in her feet she called the ambulance and was found to be in rapid A. fib at the time she was given metoprolol and treated accordingly given p.o. antibiotics Keflex 500 twice daily  She states that she woke up again this morning not feeling well--because of her urinary symptoms yesterday she was tested and it was found that she had bacteremia and hence she was called back to the emergency room for further management at the same    ED Course: Given 2 doses of metoprolol 1 IV because heart rate was 150 when she came and then started on her home meds of 25 metoprolol IV bolus 1 L given, ceftriaxone IV x1  given   Hospital course:  Pyelonephritis Urine and blood cultures significant for pan-sensitive e. Coli. Repeat blood cultures no growth to date. Empirically treated with ceftriaxone and transitioned to Cefdinir on discharge.  Bacteremia E. Coli. Repeat blood cultures no growth to date. Previous blood culture significant for pansensitive E. Coli. Management above.  Atrial fibrillation with RVR Patient with paroxysmal atrial fibrillation as an outpatient. On metoprolol as an outpatient. On Xarelto. Metoprolol increased to 50 mg BID. Cardiology consulted and patient reverted to NSR. She did have episodes of bradycardia to 40 bpm overnight. Cardiology plan to continue metoprolol 50 mg BID and schedule for an event monitor as an outpatient. Precautions discussed with patient.  Essential hypertension On metoprolol and hydralazine as an outpatient  Diabetes mellitus, type 2 Last hemoglobin A1C of 6.6%. Continue home insulin regimen.  Left kidney lesion Outpatient follow-up  History of Takotsubo cardiomyopathy Chronic diastolic heart failure History of NSTEMI  Discharge Diagnoses:  Principal Problem:   Atrial fibrillation with RVR (HCC) Active Problems:   Paroxysmal atrial fibrillation [facility])   Essential hypertension   Pyelonephritis   Bacteremia    Discharge Instructions  Discharge Instructions    Call MD for:  persistant dizziness or light-headedness   Complete by:  As directed    Diet - low sodium heart healthy   Complete by:  As directed    Increase activity slowly   Complete by:  As directed      Allergies as of 09/18/2018      Reactions   Anoro Ellipta [umeclidinium-vilanterol] Palpitations  Losartan Shortness Of Breath   wheezing wheezing   Norvasc [amlodipine Besylate] Swelling   Pregabalin Swelling   TO LOWER EXTREMTIES TO LOWER EXTREMTIES   Bupropion    Other reaction(s): Other Sucidal thoughts   Metoclopramide Other (See Comments)   Tardive  dyskinesia, tremors   Lisinopril Cough   Metformin Diarrhea   Metformin And Related Diarrhea      Medication List    STOP taking these medications   cephALEXin 500 MG capsule Commonly known as:  KEFLEX     TAKE these medications   acetaminophen 650 MG CR tablet Commonly known as:  TYLENOL Take 1,300 mg by mouth every 8 (eight) hours as needed for pain.   albuterol 108 (90 Base) MCG/ACT inhaler Commonly known as:  PROVENTIL HFA;VENTOLIN HFA Inhale 1 puff into the lungs every 6 (six) hours as needed for wheezing or shortness of breath.   cefdinir 300 MG capsule Commonly known as:  OMNICEF Take 1 capsule (300 mg total) by mouth every 12 (twelve) hours for 8 days.   CENTRUM SILVER PO Take 1 tablet by mouth daily.   DULoxetine 30 MG capsule Commonly known as:  CYMBALTA Take 30 mg by mouth daily.   furosemide 20 MG tablet Commonly known as:  LASIX Take 1 tablet (20 mg total) by mouth daily.   hydrALAZINE 50 MG tablet Commonly known as:  APRESOLINE Take 1 tablet (50 mg total) by mouth 3 (three) times daily. What changed:  when to take this   insulin glargine 100 UNIT/ML injection Commonly known as:  LANTUS Inject 0.15 mLs (15 Units total) into the skin at bedtime. What changed:    how much to take  when to take this   metoprolol tartrate 50 MG tablet Commonly known as:  LOPRESSOR Take 1 tablet (50 mg total) by mouth 2 (two) times daily. What changed:    medication strength  how much to take   nitroGLYCERIN 0.3 MG SL tablet Commonly known as:  NITROSTAT Place 1 tablet (0.3 mg total) under the tongue every 5 (five) minutes as needed for chest pain.   pravastatin 10 MG tablet Commonly known as:  PRAVACHOL Take 10 mg by mouth every evening.   Vitamin D3 1000 units Caps Take 1 tablet by mouth daily.   XARELTO 20 MG Tabs tablet Generic drug:  rivaroxaban TAKE 1 TABLET(20 MG) BY MOUTH DAILY WITH SUPPER What changed:  See the new instructions.       Follow-up Information    Lelon Perla, MD Follow up on 11/10/2018.   Specialty:  Cardiology Why:  at 11:20 AM Contact information: [address] [place] [address] [phone]        [facility] Follow up on 09/28/2018.   Specialty:  Cardiology Why:  at 4:00PM  to obtain monitor 30 day event monitor. Contact information: [address], [address] [place]       Bernerd Limbo, MD. Schedule an appointment as soon as possible for a visit in 1 week(s).   Specialty:  Family Medicine Why:  Bacteremia Contact information: [address] [place] [address] [phone]          Allergies  Allergen Reactions  . Anoro Ellipta [Umeclidinium-Vilanterol] Palpitations  . Losartan Shortness Of Breath    wheezing wheezing  . Norvasc [Amlodipine Besylate] Swelling  . Pregabalin Swelling    TO LOWER EXTREMTIES TO LOWER EXTREMTIES  . Bupropion  Other reaction(s): Other Sucidal thoughts  . Metoclopramide Other (See Comments)    Tardive dyskinesia, tremors  . Lisinopril Cough  . Metformin Diarrhea  . Metformin And Related Diarrhea    Consultations:  Cardiology   Procedures/Studies: Dg Chest 2 View  Result Date: 09/14/2018 CLINICAL DATA:  Fever EXAM: CHEST - 2 VIEW COMPARISON:  08/15/2018 FINDINGS: Peribronchial thickening. Heart is upper limits normal in size. No confluent airspace opacities or effusions. No acute bony abnormality. IMPRESSION: Bronchitic changes. Electronically Signed   By: [name] M.D.   On: 09/14/2018 09:01      Subjective: No chest pain or palpitations.  Discharge Exam: Vitals:   09/17/18 2022 09/18/18 0439  BP: 128/68 (!) 147/67  Pulse: 61 62  Resp: 18 14  Temp: 98.1 F (36.7 C) (!) 97.4 F (36.3 C)  SpO2: 98% 95%   Vitals:   09/17/18 0611 09/17/18 1231 09/17/18 2022 09/18/18 0439  BP: 114/74 123/85 128/68 (!) 147/67  Pulse: 93 64 61  62  Resp: 16 20 18 14   Temp: 98.7 F (37.1 C) 98.5 F (36.9 C) 98.1 F (36.7 C) (!) 97.4 F (36.3 C)  TempSrc: Oral Oral Oral Oral  SpO2: 99% 98% 98% 95%  Weight:      Height:        General: Pt is alert, awake, not in acute distress Cardiovascular: RRR, S1/S2 +, no rubs, no gallops Respiratory: CTA bilaterally, no wheezing, no rhonchi Abdominal: Soft, NT, ND, bowel sounds + Extremities: no edema, no cyanosis    The results of significant diagnostics from this hospitalization (including imaging, microbiology, ancillary and laboratory) are listed below for reference.     Microbiology: Recent Results (from the past 240 hour(s))  Culture, blood (Routine x 2)     Status: None (Preliminary result)   Collection Time: 09/14/18  8:37 AM  Result Value Ref Range Status   Specimen Description   Final    BLOOD LEFT ANTECUBITAL Performed at [facility] [address], [place], [place] [address]    Special Requests   Final    BOTTLES DRAWN AEROBIC AND ANAEROBIC Blood Culture adequate volume Performed at [facility] [address], [place], [place] [address]    Culture   Final    NO GROWTH 4 DAYS Performed at [facility], [place] [address], [place], [place] [address]    Report Status PENDING  Incomplete  Culture, blood (Routine x 2)     Status: Abnormal   Collection Time: 09/14/18  9:05 AM  Result Value Ref Range Status   Specimen Description   Final    BLOOD LEFT ANTECUBITAL Performed at [facility] [address] [place], [place] [address]    Special Requests   Final    BOTTLES DRAWN AEROBIC AND ANAEROBIC Blood Culture adequate volume Performed at [facility] [address] [place], [place] [address]    Culture  Setup Time   Final    GRAM NEGATIVE RODS ANAEROBIC BOTTLE ONLY CRITICAL RESULT CALLED TO, READ BACK BY AND VERIFIED WITHCasandra Doffing RN [id number] 09/15/18 A BROWNING Performed at [facility], [place] [address] [place], [place] [address]    Culture ESCHERICHIA COLI (A)  Final   Report Status 09/17/2018 FINAL  Final   Organism ID, Bacteria ESCHERICHIA COLI  Final      Susceptibility   Escherichia coli - MIC*    AMPICILLIN <=2 SENSITIVE Sensitive  CEFAZOLIN <=4 SENSITIVE Sensitive     CEFEPIME <=1 SENSITIVE Sensitive     CEFTAZIDIME <=1 SENSITIVE Sensitive     CEFTRIAXONE <=1 SENSITIVE Sensitive     CIPROFLOXACIN <=0.25 SENSITIVE Sensitive     GENTAMICIN <=1 SENSITIVE Sensitive     IMIPENEM <=0.25 SENSITIVE Sensitive     TRIMETH/SULFA <=20 SENSITIVE Sensitive     AMPICILLIN/SULBACTAM <=2 SENSITIVE Sensitive     PIP/TAZO <=4 SENSITIVE Sensitive     Extended ESBL NEGATIVE Sensitive     * ESCHERICHIA COLI  Blood Culture ID Panel (Reflexed)     Status: Abnormal   Collection Time: 09/14/18  9:05 AM  Result Value Ref Range Status   Enterococcus species NOT DETECTED NOT DETECTED Final   Listeria monocytogenes NOT DETECTED NOT DETECTED Final   Staphylococcus species NOT DETECTED NOT DETECTED Final   Staphylococcus aureus NOT DETECTED NOT DETECTED Final   Streptococcus species NOT DETECTED NOT DETECTED Final   Streptococcus agalactiae NOT DETECTED NOT DETECTED Final   Streptococcus pneumoniae NOT DETECTED NOT DETECTED Final   Streptococcus pyogenes NOT DETECTED NOT DETECTED Final   Acinetobacter baumannii NOT DETECTED NOT DETECTED Final   Enterobacteriaceae species DETECTED (A) NOT DETECTED Final    Comment: Enterobacteriaceae represent a large family of gram-negative bacteria, not a single organism. CRITICAL RESULT CALLED TO, READ BACK BY AND VERIFIED WITH: Casandra Doffing RN [phone] 09/15/18 A BROWNING    Enterobacter cloacae complex NOT DETECTED NOT DETECTED Final   Escherichia coli DETECTED (A) NOT DETECTED Final    Comment: CRITICAL RESULT CALLED TO, READ BACK BY AND VERIFIED WITH: Casandra Doffing RN [phone] 09/15/18 A BROWNING    Klebsiella oxytoca NOT DETECTED NOT DETECTED Final    Klebsiella pneumoniae NOT DETECTED NOT DETECTED Final   Proteus species NOT DETECTED NOT DETECTED Final   Serratia marcescens NOT DETECTED NOT DETECTED Final   Carbapenem resistance NOT DETECTED NOT DETECTED Final   Haemophilus influenzae NOT DETECTED NOT DETECTED Final   Neisseria meningitidis NOT DETECTED NOT DETECTED Final   Pseudomonas aeruginosa NOT DETECTED NOT DETECTED Final   Candida albicans NOT DETECTED NOT DETECTED Final   Candida glabrata NOT DETECTED NOT DETECTED Final   Candida krusei NOT DETECTED NOT DETECTED Final   Candida parapsilosis NOT DETECTED NOT DETECTED Final   Candida tropicalis NOT DETECTED NOT DETECTED Final    Comment: Performed at [facility], [place] [address], [place], [place] [address]  Urine culture     Status: Abnormal   Collection Time: 09/14/18 10:08 AM  Result Value Ref Range Status   Specimen Description   Final    URINE, CATHETERIZED Performed at [facility] [address] [place], [place] [address]    Special Requests   Final    NONE Performed at [facility], [place] [address], [place], [place] [address]    Culture >=100,000 COLONIES/mL ESCHERICHIA COLI (A)  Final   Report Status 09/16/2018 FINAL  Final   Organism ID, Bacteria ESCHERICHIA COLI (A)  Final      Susceptibility   Escherichia coli - MIC*    AMPICILLIN <=2 SENSITIVE Sensitive     CEFAZOLIN <=4 SENSITIVE Sensitive     CEFTRIAXONE <=1 SENSITIVE Sensitive     CIPROFLOXACIN <=0.25 SENSITIVE Sensitive     GENTAMICIN <=1 SENSITIVE Sensitive     IMIPENEM <=0.25 SENSITIVE Sensitive     NITROFURANTOIN <=16 SENSITIVE Sensitive     TRIMETH/SULFA <=20 SENSITIVE Sensitive     AMPICILLIN/SULBACTAM <=2 SENSITIVE  Sensitive     PIP/TAZO <=4 SENSITIVE Sensitive     Extended ESBL NEGATIVE Sensitive     * >=100,000 COLONIES/mL ESCHERICHIA COLI  Culture, blood (routine x 2)     Status: None (Preliminary result)   Collection Time: 09/15/18  1:00 PM   Result Value Ref Range Status   Specimen Description   Final    BLOOD LEFT ANTECUBITAL Performed at [facility] [address] [place], [place] [address]    Special Requests   Final    BOTTLES DRAWN AEROBIC AND ANAEROBIC Blood Culture adequate volume Performed at [facility] [address] [place], [place] [address]    Culture   Final    NO GROWTH 3 DAYS Performed at [facility], [place] [address], [place], [place] [address]    Report Status PENDING  Incomplete  Culture, blood (routine x 2)     Status: None (Preliminary result)   Collection Time: 09/15/18  1:59 PM  Result Value Ref Range Status   Specimen Description   Final    BLOOD RIGHT ANTECUBITAL Performed at [facility] [address], [place], [place] [address]    Special Requests   Final    BOTTLES DRAWN AEROBIC AND ANAEROBIC Blood Culture results may not be optimal due to an excessive volume of blood received in culture bottles Performed at [facility] [address], [place], [place] [address]    Culture   Final    NO GROWTH 3 DAYS Performed at [facility], [place] [address] [place],  [address]    Report Status PENDING  Incomplete     Labs: BNP (last 3 results) Recent Labs    08/15/18 2030  BNP 798.9*   Basic Metabolic Panel: Recent Labs  Lab 09/11/18 2018 09/14/18 0837 09/15/18 1252 09/16/18 0407  NA 137 136 140 140  K 3.6 3.8 3.3* 3.6  CL 102 101 106 109  CO2 25 24 21* 21*  GLUCOSE 87 123* 102* 123*  BUN 35* 36* 28* 22  CREATININE 1.14* 1.27* 0.99 0.88  CALCIUM 9.2 8.6* 7.6* 7.5*   Liver Function Tests: Recent Labs  Lab 09/11/18 2018 09/14/18 0837 09/16/18 0407  AST 20 20 22   ALT 19 18 20   ALKPHOS 62 73 68  BILITOT 0.7 1.0 0.7  PROT 7.0 7.0 5.7*  ALBUMIN 3.5 3.2* 2.4*   No results for input(s): LIPASE, AMYLASE in the last 168 hours. No results for input(s): AMMONIA in the last 168  hours. CBC: Recent Labs  Lab 09/14/18 0837 09/15/18 1252 09/16/18 0407  WBC 13.4* 10.5 10.4  NEUTROABS 11.3* 8.0* 7.1  HGB 13.2 13.0 12.5  HCT 39.6 39.0 37.3  MCV 92.5 93.5 92.8  PLT 256 250 269   Cardiac Enzymes: Recent Labs  Lab 09/14/18 0907  CKTOTAL 111   BNP: Invalid input(s): POCBNP CBG: Recent Labs  Lab 09/17/18 1148 09/17/18 1639 09/17/18 2026 09/18/18 0751 09/18/18 0815  GLUCAP 147* 123* 113* 68* 88   D-Dimer No results for input(s): DDIMER in the last 72 hours. Hgb A1c No results for input(s): HGBA1C in the last 72 hours. Lipid Profile No results for input(s): CHOL, HDL, LDLCALC, TRIG, CHOLHDL, LDLDIRECT in the last 72 hours. Thyroid function studies No results for input(s): TSH, T4TOTAL, T3FREE, THYROIDAB in the last 72 hours.  Invalid input(s): FREET3 Anemia work up No results for input(s): VITAMINB12, FOLATE, FERRITIN, TIBC, IRON, RETICCTPCT in the last 72 hours.  Urinalysis    Component Value Date/Time   COLORURINE YELLOW 09/14/2018 1007   APPEARANCEUR HAZY (A) 09/14/2018 1007   LABSPEC 1.010 09/14/2018 1007   PHURINE 5.0 09/14/2018 1007   GLUCOSEU NEGATIVE 09/14/2018 1007   HGBUR NEGATIVE 09/14/2018 1007   BILIRUBINUR NEGATIVE 09/14/2018 1007   KETONESUR 5 (A) 09/14/2018 1007   PROTEINUR 30 (A) 09/14/2018 1007   NITRITE NEGATIVE 09/14/2018 1007   LEUKOCYTESUR MODERATE (A) 09/14/2018 1007   Sepsis Labs Invalid input(s): PROCALCITONIN,  WBC,  LACTICIDVEN Microbiology Recent Results (from the past 240 hour(s))  Culture, blood (Routine x 2)     Status: None (Preliminary result)   Collection Time: 09/14/18  8:37 AM  Result Value Ref Range Status   Specimen Description   Final    BLOOD LEFT ANTECUBITAL Performed at [facility], [place] [address], [place], [place] [address]    Special Requests   Final    BOTTLES DRAWN AEROBIC AND ANAEROBIC Blood Culture adequate volume Performed at [facility]  [address], [place], [place] [address]    Culture   Final    NO GROWTH 4 DAYS Performed at [facility], [place] [address] [place], [place] [address]    Report Status PENDING  Incomplete  Culture, blood (Routine x 2)     Status: Abnormal   Collection Time: 09/14/18  9:05 AM  Result Value Ref Range Status   Specimen Description   Final    BLOOD LEFT ANTECUBITAL Performed at [facility] [address], [place], [place] [address]    Special Requests   Final    BOTTLES DRAWN AEROBIC AND ANAEROBIC Blood Culture adequate volume Performed at [facility] [address] [place], [place] [address]    Culture  Setup Time   Final    GRAM NEGATIVE RODS ANAEROBIC BOTTLE ONLY CRITICAL RESULT CALLED TO, READ BACK BY AND VERIFIED WITHCasandra Doffing RN [phone] 09/15/18 A BROWNING Performed at [facility], [place] [address], [place], [place] [address]    Culture ESCHERICHIA COLI (A)  Final   Report Status 09/17/2018 FINAL  Final   Organism ID, Bacteria ESCHERICHIA COLI  Final      Susceptibility   Escherichia coli - MIC*    AMPICILLIN <=2 SENSITIVE Sensitive     CEFAZOLIN <=4 SENSITIVE Sensitive     CEFEPIME <=1 SENSITIVE Sensitive     CEFTAZIDIME <=1 SENSITIVE Sensitive     CEFTRIAXONE <=1 SENSITIVE Sensitive     CIPROFLOXACIN <=0.25 SENSITIVE Sensitive     GENTAMICIN <=1 SENSITIVE Sensitive     IMIPENEM <=0.25 SENSITIVE Sensitive     TRIMETH/SULFA <=20 SENSITIVE Sensitive     AMPICILLIN/SULBACTAM <=2 SENSITIVE Sensitive     PIP/TAZO <=4 SENSITIVE Sensitive     Extended ESBL NEGATIVE Sensitive     * ESCHERICHIA COLI  Blood Culture ID Panel (Reflexed)     Status: Abnormal   Collection Time: 09/14/18  9:05 AM  Result Value Ref Range Status   Enterococcus species NOT DETECTED NOT DETECTED Final   Listeria monocytogenes NOT DETECTED NOT DETECTED Final   Staphylococcus species NOT DETECTED NOT DETECTED Final   Staphylococcus aureus NOT DETECTED NOT  DETECTED Final   Streptococcus species NOT DETECTED NOT DETECTED Final   Streptococcus agalactiae NOT DETECTED NOT DETECTED Final   Streptococcus pneumoniae NOT DETECTED NOT DETECTED Final   Streptococcus pyogenes NOT DETECTED NOT DETECTED Final   Acinetobacter baumannii NOT DETECTED NOT DETECTED  Final   Enterobacteriaceae species DETECTED (A) NOT DETECTED Final    Comment: Enterobacteriaceae represent a large family of gram-negative bacteria, not a single organism. CRITICAL RESULT CALLED TO, READ BACK BY AND VERIFIED WITH: Casandra Doffing RN [phone] 09/15/18 A BROWNING    Enterobacter cloacae complex NOT DETECTED NOT DETECTED Final   Escherichia coli DETECTED (A) NOT DETECTED Final    Comment: CRITICAL RESULT CALLED TO, READ BACK BY AND VERIFIED WITH: Casandra Doffing RN [phone] 09/15/18 A BROWNING    Klebsiella oxytoca NOT DETECTED NOT DETECTED Final   Klebsiella pneumoniae NOT DETECTED NOT DETECTED Final   Proteus species NOT DETECTED NOT DETECTED Final   Serratia marcescens NOT DETECTED NOT DETECTED Final   Carbapenem resistance NOT DETECTED NOT DETECTED Final   Haemophilus influenzae NOT DETECTED NOT DETECTED Final   Neisseria meningitidis NOT DETECTED NOT DETECTED Final   Pseudomonas aeruginosa NOT DETECTED NOT DETECTED Final   Candida albicans NOT DETECTED NOT DETECTED Final   Candida glabrata NOT DETECTED NOT DETECTED Final   Candida krusei NOT DETECTED NOT DETECTED Final   Candida parapsilosis NOT DETECTED NOT DETECTED Final   Candida tropicalis NOT DETECTED NOT DETECTED Final    Comment: Performed at [facility], [place] [address], [place], [place] [address]  Urine culture     Status: Abnormal   Collection Time: 09/14/18 10:08 AM  Result Value Ref Range Status   Specimen Description   Final    URINE, CATHETERIZED Performed at [facility] [address], [place], [place] [address]    Special Requests   Final    NONE Performed at [facility], [place]  [address], [place], [place] [address]    Culture >=100,000 COLONIES/mL ESCHERICHIA COLI (A)  Final   Report Status 09/16/2018 FINAL  Final   Organism ID, Bacteria ESCHERICHIA COLI (A)  Final      Susceptibility   Escherichia coli - MIC*    AMPICILLIN <=2 SENSITIVE Sensitive     CEFAZOLIN <=4 SENSITIVE Sensitive     CEFTRIAXONE <=1 SENSITIVE Sensitive     CIPROFLOXACIN <=0.25 SENSITIVE Sensitive     GENTAMICIN <=1 SENSITIVE Sensitive     IMIPENEM <=0.25 SENSITIVE Sensitive     NITROFURANTOIN <=16 SENSITIVE Sensitive     TRIMETH/SULFA <=20 SENSITIVE Sensitive     AMPICILLIN/SULBACTAM <=2 SENSITIVE Sensitive     PIP/TAZO <=4 SENSITIVE Sensitive     Extended ESBL NEGATIVE Sensitive     * >=100,000 COLONIES/mL ESCHERICHIA COLI  Culture, blood (routine x 2)     Status: None (Preliminary result)   Collection Time: 09/15/18  1:00 PM  Result Value Ref Range Status   Specimen Description   Final    BLOOD LEFT ANTECUBITAL Performed at [facility] [address], [place], [place] [address]    Special Requests   Final    BOTTLES DRAWN AEROBIC AND ANAEROBIC Blood Culture adequate volume Performed at [facility] [address] [place], [place] [address]    Culture   Final    NO GROWTH 3 DAYS Performed at [facility], [place] [address] [place], [place] [address]    Report Status PENDING  Incomplete  Culture, blood (routine x 2)     Status: None (Preliminary result)   Collection Time: 09/15/18  1:59 PM  Result Value Ref Range Status   Specimen Description   Final    BLOOD RIGHT ANTECUBITAL Performed at [facility]  [address], [place], [place] [address]    Special Requests   Final    BOTTLES DRAWN AEROBIC AND ANAEROBIC Blood Culture results may not be optimal due to an excessive volume of blood received in culture bottles Performed at [facility] [address], [place], [place] [address]     Culture   Final    NO GROWTH 3 DAYS Performed at [facility], [place] [address], [place], [place] [address]    Report Status PENDING  Incomplete     SIGNED:   Cordelia Poche, MD [facility] 09/18/2018, 12:38 PM

## 2018-09-19 LAB — CULTURE, BLOOD (ROUTINE X 2)
Culture: NO GROWTH
Special Requests: ADEQUATE

## 2018-09-20 LAB — CULTURE, BLOOD (ROUTINE X 2)
CULTURE: NO GROWTH
Culture: NO GROWTH
Special Requests: ADEQUATE

## 2018-09-21 ENCOUNTER — Telehealth: Payer: Self-pay | Admitting: Cardiology

## 2018-09-21 NOTE — Telephone Encounter (Signed)
Returned call to patient who is requesting only to speak with Doctor, hospital. Notified patient that Hilda Blades is in clinic seeing patients with MD today so it may be later before she will get a call back.

## 2018-09-21 NOTE — Telephone Encounter (Signed)
° °  Patient requesting to speak with nurse regarding follow care from the hospital. Wants to discuss her medications. Please call

## 2018-09-21 NOTE — Telephone Encounter (Signed)
Spoke with pt, questions regarding medications from recent discharge discussed and answered.

## 2018-09-25 ENCOUNTER — Encounter (HOSPITAL_COMMUNITY): Payer: Self-pay

## 2018-09-25 ENCOUNTER — Encounter (HOSPITAL_COMMUNITY): Payer: Medicare Other

## 2018-09-25 ENCOUNTER — Emergency Department (HOSPITAL_COMMUNITY): Payer: Medicare Other

## 2018-09-25 ENCOUNTER — Telehealth: Payer: Self-pay | Admitting: Cardiology

## 2018-09-25 ENCOUNTER — Emergency Department (HOSPITAL_COMMUNITY)
Admission: EM | Admit: 2018-09-25 | Discharge: 2018-09-25 | Disposition: A | Discharge status: Home / Self Care | Payer: Medicare Other | Attending: Emergency Medicine | Admitting: Emergency Medicine

## 2018-09-25 ENCOUNTER — Emergency Department (HOSPITAL_BASED_OUTPATIENT_CLINIC_OR_DEPARTMENT_OTHER): Payer: Medicare Other

## 2018-09-25 DIAGNOSIS — Z7901 Long term (current) use of anticoagulants: Secondary | ICD-10-CM | POA: Insufficient documentation

## 2018-09-25 DIAGNOSIS — E119 Type 2 diabetes mellitus without complications: Secondary | ICD-10-CM | POA: Diagnosis not present

## 2018-09-25 DIAGNOSIS — R600 Localized edema: Secondary | ICD-10-CM | POA: Diagnosis not present

## 2018-09-25 DIAGNOSIS — I4891 Unspecified atrial fibrillation: Secondary | ICD-10-CM | POA: Diagnosis not present

## 2018-09-25 DIAGNOSIS — Z79899 Other long term (current) drug therapy: Secondary | ICD-10-CM | POA: Insufficient documentation

## 2018-09-25 DIAGNOSIS — R0989 Other specified symptoms and signs involving the circulatory and respiratory systems: Secondary | ICD-10-CM | POA: Insufficient documentation

## 2018-09-25 DIAGNOSIS — R5383 Other fatigue: Secondary | ICD-10-CM | POA: Diagnosis not present

## 2018-09-25 DIAGNOSIS — R609 Edema, unspecified: Secondary | ICD-10-CM | POA: Diagnosis not present

## 2018-09-25 DIAGNOSIS — F17210 Nicotine dependence, cigarettes, uncomplicated: Secondary | ICD-10-CM | POA: Insufficient documentation

## 2018-09-25 DIAGNOSIS — R531 Weakness: Secondary | ICD-10-CM | POA: Diagnosis present

## 2018-09-25 DIAGNOSIS — I739 Peripheral vascular disease, unspecified: Secondary | ICD-10-CM | POA: Insufficient documentation

## 2018-09-25 DIAGNOSIS — I10 Essential (primary) hypertension: Secondary | ICD-10-CM | POA: Diagnosis not present

## 2018-09-25 DIAGNOSIS — M7989 Other specified soft tissue disorders: Secondary | ICD-10-CM

## 2018-09-25 LAB — COMPREHENSIVE METABOLIC PANEL
ALBUMIN: 3.1 g/dL — AB (ref 3.5–5.0)
ALT: 20 U/L (ref 0–44)
AST: 21 U/L (ref 15–41)
Alkaline Phosphatase: 76 U/L (ref 38–126)
Anion gap: 11 (ref 5–15)
BILIRUBIN TOTAL: 0.6 mg/dL (ref 0.3–1.2)
BUN: 18 mg/dL (ref 8–23)
CO2: 29 mmol/L (ref 22–32)
CREATININE: 0.81 mg/dL (ref 0.44–1.00)
Calcium: 9.7 mg/dL (ref 8.9–10.3)
Chloride: 106 mmol/L (ref 98–111)
GFR calc Af Amer: >60 mL/min (ref >60)
GFR calc non Af Amer: >60 mL/min (ref >60)
GLUCOSE: 135 mg/dL — AB (ref 70–99)
Potassium: 4.5 mmol/L (ref 3.5–5.1)
Sodium: 146 mmol/L — ABNORMAL HIGH (ref 135–145)
Total Protein: 7.3 g/dL (ref 6.5–8.1)

## 2018-09-25 LAB — CBC WITH DIFFERENTIAL/PLATELET
Basophils Absolute: 0.1 K/uL (ref 0.0–0.1)
Basophils Relative: 1 %
Eosinophils Absolute: 0.4 K/uL (ref 0.0–0.7)
Eosinophils Relative: 4 %
HCT: 39.5 % (ref 36.0–46.0)
Hemoglobin: 12.8 g/dL (ref 12.0–15.0)
Lymphocytes Relative: 24 %
Lymphs Abs: 2.4 K/uL (ref 0.7–4.0)
MCH: 30.8 pg (ref 26.0–34.0)
MCHC: 32.4 g/dL (ref 30.0–36.0)
MCV: 95.2 fL (ref 78.0–100.0)
Monocytes Absolute: 0.6 K/uL (ref 0.1–1.0)
Monocytes Relative: 6 %
Neutro Abs: 6.5 K/uL (ref 1.7–7.7)
Neutrophils Relative %: 65 %
Platelets: 483 K/uL — ABNORMAL HIGH (ref 150–400)
RBC: 4.15 MIL/uL (ref 3.87–5.11)
RDW: 14.3 % (ref 11.5–15.5)
WBC: 9.9 K/uL (ref 4.0–10.5)

## 2018-09-25 LAB — URINALYSIS, ROUTINE W REFLEX MICROSCOPIC
Bilirubin Urine: NEGATIVE
Glucose, UA: NEGATIVE mg/dL
Hgb urine dipstick: NEGATIVE
Ketones, ur: NEGATIVE mg/dL
Leukocytes, UA: NEGATIVE
Nitrite: NEGATIVE
Protein, ur: NEGATIVE mg/dL
Specific Gravity, Urine: 1.009 (ref 1.005–1.030)
pH: 7 (ref 5.0–8.0)

## 2018-09-25 LAB — MAGNESIUM: Magnesium: 2 mg/dL (ref 1.7–2.4)

## 2018-09-25 LAB — I-STAT TROPONIN, ED: Troponin i, poc: 0.02 ng/mL (ref 0.00–0.08)

## 2018-09-25 LAB — I-STAT CG4 LACTIC ACID, ED: Lactic Acid, Venous: 0.76 mmol/L (ref 0.5–1.9)

## 2018-09-25 LAB — BRAIN NATRIURETIC PEPTIDE: B Natriuretic Peptide: 393.7 pg/mL — ABNORMAL HIGH (ref 0.0–100.0)

## 2018-09-25 LAB — TSH: TSH: 1.042 u[IU]/mL (ref 0.350–4.500)

## 2018-09-25 NOTE — ED Provider Notes (Signed)
[facility] DEPT Provider Note   CSN: [id number] Arrival date & time: 09/25/18  1441     History   Chief Complaint Chief Complaint  Patient presents with  . Weakness    HPI Kacee Sukhu is a 77 y.o. female.  The history is provided by the patient, medical records and a relative. The history is limited by a language barrier. No language interpreter was used.  Illness  This is a new problem. The current episode started more than 2 days ago. The problem occurs constantly. The problem has been gradually worsening. Pertinent negatives include no chest pain, no abdominal pain, no headaches and no shortness of breath. The symptoms are aggravated by exertion. Nothing relieves the symptoms. She has tried nothing for the symptoms. The treatment provided no relief.    Past Medical History:  Diagnosis Date  . Anemia    microcytic   . Arthritis   . Atrial fibrillation ([place])   . Cataract    REMOVED BILATERAL  . Colon polyps   . Diabetes mellitus ([place])   . Fibromyalgia   . Gastroparesis   . Hyperlipidemia   . Hypertension   . Kidney stones   . Melanoma ([place])   . PMR (polymyalgia rheumatica) (HCC)   . PVD (peripheral vascular disease) [facility])     Patient Active Problem List   Diagnosis Date Noted  . Atrial fibrillation with RVR ([place]) 09/16/2018  . Pyelonephritis 09/15/2018  . Bacteremia 09/15/2018  . Depression 12/03/2017  . Normal coronary arteries 12/03/2017  . 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 Polymyalgia rheumatica ([place]) 12/03/2017  . NSTEMI (non-ST elevated myocardial infarction) ([place]) 06/02/2017  . Fracture of rib of right side 03/20/2016  . Acute respiratory failure with hypoxia ([place]) 03/20/2016  . PAF (paroxysmal atrial fibrillation) ([place]) 03/20/2016  . Pneumothorax on right 03/20/2016  . ARDS (adult respiratory distress syndrome) ([place]) 03/20/2016  . Insulin dependent diabetes mellitus ([place]) 03/20/2016  . GERD (gastroesophageal reflux  disease) 03/20/2016  . Dysphagia   . Abdominal pain, epigastric   . Gastric polyp   . Anemia, iron deficiency   . Angiodysplasia of cecum   . Colon polyp   . 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 Chronic anticoagulation 07/28/2015  . Family history of colon cancer 07/28/2015  . CN (constipation) 07/28/2015  . Paroxysmal atrial fibrillation ([place]) 04/04/2015  . Carotid artery disease ([place]) 04/04/2015  . Tobacco abuse 04/04/2015  . Essential hypertension 04/04/2015  . Hyperlipidemia 04/04/2015    Past Surgical History:  Procedure Laterality Date  . APPENDECTOMY    . BREAST BIOPSY    . CESAREAN SECTION    . COLONOSCOPY    . COLONOSCOPY WITH PROPOFOL N/A 12/26/2015   Procedure: COLONOSCOPY WITH PROPOFOL;  Surgeon: Manus Gunning, MD;  [facility];  Service: Gastroenterology;  Laterality: N/A;  . ESOPHAGOGASTRODUODENOSCOPY (EGD) WITH PROPOFOL N/A 12/26/2015   Procedure: ESOPHAGOGASTRODUODENOSCOPY (EGD) WITH PROPOFOL;  Surgeon: Manus Gunning, MD;  [facility];  Service: Gastroenterology;  Laterality: N/A;  . LEFT HEART CATH AND CORONARY ANGIOGRAPHY N/A 06/03/2017   Procedure: Left Heart Cath and Coronary Angiography;  Surgeon: Martinique, Peter M, MD;  Location: [facility];  Service: Cardiovascular;  Laterality: N/A;  . POLYPECTOMY       OB History   None      Home Medications    Prior to Admission medications   Medication Sig Start Date End Date Taking? Authorizing Provider  acetaminophen (TYLENOL) 650 MG CR tablet Take 1,300  mg by mouth every 8 (eight) hours as needed for pain.    [provider]  albuterol (PROVENTIL HFA;VENTOLIN HFA) 108 (90 Base) MCG/ACT inhaler Inhale 1 puff into the lungs every 6 (six) hours as needed for wheezing or shortness of breath.    [provider]  cefdinir (OMNICEF) 300 MG capsule Take 1 capsule (300 mg total) by mouth every 12 (twelve) hours for 8 days. 09/18/18 09/26/18  Mariel Aloe,  MD  Cholecalciferol (VITAMIN D3) 1000 UNITS CAPS Take 1 tablet by mouth daily.    [provider]  DULoxetine (CYMBALTA) 30 MG capsule Take 30 mg by mouth daily.     [provider]  furosemide (LASIX) 20 MG tablet Take 1 tablet (20 mg total) by mouth daily. 06/19/18   Lelon Perla, MD  hydrALAZINE (APRESOLINE) 50 MG tablet Take 1 tablet (50 mg total) by mouth 3 (three) times daily. Patient taking differently: Take 50 mg by mouth 2 (two) times daily.  07/13/18 10/11/18  Lelon Perla, MD  insulin glargine (LANTUS) 100 UNIT/ML injection Inject 0.15 mLs (15 Units total) into the skin at bedtime. Patient taking differently: Inject 40 Units into the skin every morning.  06/06/17   [name], MD  metoprolol tartrate (LOPRESSOR) 50 MG tablet Take 1 tablet (50 mg total) by mouth 2 (two) times daily. 09/18/18   Mariel Aloe, MD  Multiple Vitamins-Minerals (CENTRUM SILVER PO) Take 1 tablet by mouth daily.    [provider]  nitroGLYCERIN (NITROSTAT) 0.3 MG SL tablet Place 1 tablet (0.3 mg total) under the tongue every 5 (five) minutes as needed for chest pain. 06/09/18   Lelon Perla, MD  pravastatin (PRAVACHOL) 10 MG tablet Take 10 mg by mouth every evening.     [provider]  XARELTO 20 MG TABS tablet TAKE 1 TABLET(20 MG) BY MOUTH DAILY WITH SUPPER Patient taking differently: Take 20 mg by mouth daily.  08/18/18   Lelon Perla, MD    Family History Family History  Problem Relation Age of Onset  . CAD Father        MI and CABG  . Diabetes Father   . Heart disease Father   . Colon cancer Mother 1    Social History Social History   Tobacco Use  . Smoking status: Current Every Day Smoker    Packs/day: 1.00    Types: Cigarettes  . Smokeless tobacco: Never Used  Substance Use Topics  . Alcohol use: No    Alcohol/week: 0.0 standard drinks  . Drug use: No     Allergies   Anoro ellipta [umeclidinium-vilanterol]; Losartan;  Norvasc [amlodipine besylate]; Pregabalin; Bupropion; Metoclopramide; Lisinopril; Metformin; and Metformin and related   Review of Systems Review of Systems  Constitutional: Positive for appetite change and fatigue. Negative for chills, diaphoresis and fever.  HENT: Negative for congestion.   Eyes: Negative for visual disturbance.  Respiratory: Negative for cough, chest tightness, shortness of breath and wheezing.   Cardiovascular: Positive for leg swelling. Negative for chest pain and palpitations.  Gastrointestinal: Negative for abdominal pain, constipation, diarrhea, nausea and vomiting.  Genitourinary: Negative for decreased urine volume, dysuria, flank pain and frequency.  Musculoskeletal: Negative for back pain, neck pain and neck stiffness.  Neurological: Negative for dizziness, light-headedness and headaches.  Psychiatric/Behavioral: Negative for agitation.  All other systems reviewed and are negative.    Physical Exam Updated Vital Signs BP (!) 153/64 (BP Location: Right Arm)   Pulse 69  Temp 98.7 F (37.1 C) (Rectal)   Resp 18   SpO2 96%   Physical Exam  Constitutional: She is oriented to person, place, and time. She appears well-developed and well-nourished. No distress.  HENT:  Head: Normocephalic.  Nose: Nose normal.  Mouth/Throat: Oropharynx is clear and moist. No oropharyngeal exudate.  Eyes: Pupils are equal, round, and reactive to light. Conjunctivae and EOM are normal.  Neck: Normal range of motion.  Cardiovascular: Regular rhythm. Bradycardia present.  Pulmonary/Chest: Effort normal. No respiratory distress. She has no wheezes. She has rales. She exhibits no tenderness.  Abdominal: She exhibits no distension. There is no tenderness.  Musculoskeletal: She exhibits edema and tenderness (R calf).  Neurological: She is alert and oriented to person, place, and time. No sensory deficit. She exhibits normal muscle tone.  Skin: Capillary refill takes less than 2  seconds. No rash noted. She is not diaphoretic. No erythema.  Psychiatric: She has a normal mood and affect.  Nursing note and vitals reviewed.    ED Treatments / Results  Labs (all labs ordered are listed, but only abnormal results are displayed) Labs Reviewed  CBC WITH DIFFERENTIAL/PLATELET - Abnormal; Notable for the following components:      Result Value   Platelets 483 (*)    All other components within normal limits  COMPREHENSIVE METABOLIC PANEL - Abnormal; Notable for the following components:   Sodium 146 (*)    Glucose, Bld 135 (*)    Albumin 3.1 (*)    All other components within normal limits  BRAIN NATRIURETIC PEPTIDE - Abnormal; Notable for the following components:   B Natriuretic Peptide 393.7 (*)    All other components within normal limits  URINE CULTURE  TSH  MAGNESIUM  URINALYSIS, ROUTINE W REFLEX MICROSCOPIC  I-STAT TROPONIN, ED  I-STAT CG4 LACTIC ACID, ED    EKG EKG Interpretation  Date/Time:  Friday September 25 2018 16:10:10 EDT Ventricular Rate:  61 PR Interval:    QRS Duration: 95 QT Interval:  434 QTC Calculation: 438 R Axis:   -7 Text Interpretation:  Sinus rhythm Low voltage, precordial leads When compared to prior, slower rate.  No STEMI Confirmed by Antony Blackbird [phone]) on 09/25/2018 4:36:12 PM   Radiology Dg Chest 2 View  Result Date: 09/25/2018 CLINICAL DATA:  Weakness.  UTI and sepsis. EXAM: CHEST - 2 VIEW COMPARISON:  September 14, 2018 FINDINGS: Mild cardiomegaly. Mild diffuse interstitial opacities. No focal infiltrate. No other acute abnormalities. Healed right rib fractures. IMPRESSION: 1. Findings are most consistent with cardiomegaly and mild edema. 2. Nonacute compression fracture of an upper lumbar vertebral body. Electronically Signed   By: Dorise Bullion III M.D   On: 09/25/2018 17:05    Procedures Procedures (including critical care time)  Medications Ordered in ED Medications - No data to display   Initial  Impression / Assessment and Plan / ED Course  I have reviewed the triage vital signs and the nursing notes.  Pertinent labs & imaging results that were available during my care of the patient were reviewed by me and considered in my medical decision making (see chart for details).     Docie Abramovich is a 77 y.o. 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  Patient reports that 2 weeks ago she was admitted for several  days for A. fib with RVR, sepsis, bacteremia, and pyelonephritis.  Patient says that she has been feeling worse for the last week or so.  She says that she has not been eating or drinking anything and has been not taking her Lasix since she is not taking in fluids.  She reports that she has been taking her other medications however.  She reports that her right leg is been swollen on and off for months but the pain in the calf and redness of the right lower leg.  She reports that she had a DVT many years ago when she was pregnant.  She reports she is taking her Xarelto as directed.  She denies any chest pain palpitations or shortness of breath.  She reports that she is very fatigued when she tries to ambulate.  She reports no nausea or vomiting.  No constipation or diarrhea.  She reports her urine has looked lighter than before and she has no dysuria.  She still has several more days of her antibiotics for UTI.  She denies other complaints.  On exam, patient has pitting edema in both legs.  Right lower leg is erythematous compared to the left.  No significant wounds or ulcers are seen.  Patient has normal sensation and strength in the legs.  Patient has symmetric pulses in upper extremities.  Lungs have crackles in all lung fields.   No wheezing.  Chest and abdomen are nontender.  Patient is bradycardic into the mid 40s and 50s on monitoring.  Clinical I am concerned patient may have fluid balance issues with reported decreased oral intake as well as the decreased Lasix use.  Patient appears to have peripheral edema and fluid in the lungs on exam.  Patient will have chest x-ray and BNP to check.  She also work-up for occult infection given her recent admission and will have ultrasound for DVT of the right leg based on exam.    Anticipate reassessment including laboratory pulse ox after work-up.  8:40 PM Patient's work-up was overall reassuring.  Patient had slight elevation in sodium however her BNP was only slightly elevated compared to prior.  Do not feel patient is in florid heart failure.  Patient's amatory pulse ox was normal on room air.  X-ray showed mild pulmonary edema but no pneumonia.  Patient's of the labs were reassuring.  We see no evidence of infection in her lungs or her urine.  TSH was normal.  EKG shows no STEMI.  Patient is feeling much better.  Given reassuring work-up, we feel patient is safe for discharge home.  Patient will follow-up with her PCP in several days and understood return precautions.  Patient discharged in good condition.   Final Clinical Impressions(s) / ED Diagnoses   Final diagnoses:  Fatigue, unspecified type    ED Discharge Orders    None      Clinical Impression: 1. Fatigue, unspecified type     Disposition: Discharge  Condition: Good  I have discussed the results, Dx and Tx plan with the pt(& family if present). He/she/they expressed understanding and agree(s) with the plan. Discharge instructions discussed at great length. Strict return precautions discussed and pt &/or family have verbalized understanding of the instructions. No further questions at time of discharge.    New Prescriptions   No medications on file    Follow Up: Bernerd Limbo, MD [address]  [place] [id number] [phone]     [facility] DEPT [address]  [place] [id number] [place] [phone]       Luree Palla, Gwenyth Allegra, MD 09/25/18 2041

## 2018-09-25 NOTE — ED Notes (Signed)
Patient transported to X-ray, unable to obtain vitals at the moment

## 2018-09-25 NOTE — Telephone Encounter (Signed)
New Message:   Pt c/o Shortness Of Breath: STAT if SOB developed within the last 24 hours or pt is noticeably SOB on the phone  1. Are you currently SOB (can you hear that pt is SOB on the phone)? Patient stated no it sound differently over the phone   2. How long have you been experiencing SOB? NO   3. Are you SOB when sitting or when up moving around? NO   4. Are you currently experiencing any other symptoms? NO

## 2018-09-25 NOTE — Discharge Instructions (Signed)
Your work-up today was overall reassuring.  We did not find evidence of severe heart failure however he did have slight fluid overload.  Please follow-up with your primary doctor in several days.  If any symptoms change or worsen, please return to the nearest emergency department.

## 2018-09-25 NOTE — ED Triage Notes (Signed)
Pt presents from home via EMS with c/o weakness. Pt was recently seen in the hospital last week and diagnosed with a UTI and sepsis. Pt is alert and oriented per EMS.

## 2018-09-25 NOTE — Progress Notes (Signed)
*  Preliminary Results* Right lower extremity venous duplex completed. Right lower extremity is negative for deep vein thrombosis.  Right lower extremity is positive for superficial vein thrombosis in the small saphenous vein.  There is no evidence of right Baker's cyst.  09/25/2018 4:30 PM  Jinny Blossom Dawna Part

## 2018-09-25 NOTE — Telephone Encounter (Signed)
Spoke with patient and she wants to speak with Hilda Blades. She has follow up with PCP next week but has been feeling worse and worse each day. She is unable to eat/drink, not taking diuretic because she isn't drinking and when she does no increased output. Stated she is having issues with blood pressure going up and down. Stated she wanted to speak with Hilda Blades as she and Dr Stanford Breed are the only ones that can help her. Will forward to Luisa Dago RN

## 2018-09-25 NOTE — ED Notes (Signed)
Pt was ambulated while monitoring her pulse oximetry. Pt was able to successfully ambulate while maintaining sufficient O2 saturation with 97% being her average reading. Pt was also able to talk, and maintain a normal respiratory pattern while ambulating.

## 2018-09-25 NOTE — Telephone Encounter (Signed)
Spoke with pt, she is feeling worse today than previous. She thinks her antibiotics maybe making her sick. Advised there patient to return to the ER for evaluation and treatment. Patient voiced understanding

## 2018-09-25 NOTE — ED Notes (Signed)
Bed: CK52 Expected date:  Expected time:  Means of arrival:  Comments: 77 yo weakness-Dx sepsis last week

## 2018-09-28 ENCOUNTER — Encounter (HOSPITAL_COMMUNITY): Payer: Medicare Other

## 2018-10-02 ENCOUNTER — Telehealth: Payer: Self-pay | Admitting: Cardiology

## 2018-10-02 NOTE — Telephone Encounter (Signed)
Can hold off on event monitor until office visit. Kirk Ruths, MD

## 2018-10-02 NOTE — Telephone Encounter (Signed)
New Message    Patient is calling because she does not feel that she needs the cardiac event monitor. Please call to discuss.

## 2018-10-02 NOTE — Telephone Encounter (Signed)
Pt states that she has been "in rhythm" since she was in the hospital and does not think that she needs the event monitor. Please advise

## 2018-10-02 NOTE — Telephone Encounter (Signed)
Spoke with pt, Aware of dr crenshaw's recommendations.  °

## 2018-10-29 NOTE — Progress Notes (Signed)
HPI: FU atrial fibrillation. She has a history of paroxysmal atrial fibrillation. Abdominal ultrasound May 2016 showed no aneurysm. Patient admitted with non-ST elevation myocardial infarction in June 2018. Cardiac catheterization revealed a 20% proximal to mid LAD and normal left ventricular end-diastolic pressure. Echocardiogram showed ejection fraction 57-32%, grade 2 diastolic dysfunction, mild tricuspid regurgitation, severely elevated pulmonary pressure. Sludge noted LV apex. 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 There was note of 0.9 cm lesion in the left kidney and further evaluation with renal protocol MRI suggested in 6 months.    Had urosepsis September 2019.  Was noted to have intermittent atrial fibrillation; felt to be secondary to stress of sepsis.. Since last seen,she denies dyspnea, chest pain, palpitations or syncope.  Current Outpatient Medications  Medication Sig Dispense Refill  . acetaminophen (TYLENOL) 650 MG CR tablet Take 1,300 mg by mouth every 8 (eight) hours as needed for pain.    Marland Kitchen albuterol (PROVENTIL HFA;VENTOLIN HFA) 108 (90 Base) MCG/ACT inhaler Inhale 1 puff into the lungs every 6 (six) hours as needed for wheezing or shortness of breath.    . insulin glargine (LANTUS) 100 UNIT/ML injection Inject 0.15 mLs (15 Units total) into the skin at bedtime. 10 mL 11  . LANTUS SOLOSTAR 100 UNIT/ML Solostar Pen Inject 40 Units into the skin daily.   2  . metoprolol tartrate (LOPRESSOR) 50 MG tablet Take 1 tablet (50 mg total) by mouth 2 (two) times daily. 60 tablet 0  . nitroGLYCERIN  (NITROSTAT) 0.3 MG SL tablet Place 1 tablet (0.3 mg total) under the tongue every 5 (five) minutes as needed for chest pain. 100 tablet 1  . pravastatin (PRAVACHOL) 10 MG tablet Take 10 mg by mouth every evening.     . sertraline (ZOLOFT) 50 MG tablet Take 50 mg by mouth daily.    Alveda Reasons 20 MG TABS tablet TAKE 1 TABLET(20 MG) BY MOUTH DAILY WITH SUPPER (Patient taking differently: Take 20 mg by mouth daily. ) 90 tablet 1  . hydrALAZINE (APRESOLINE) 50 MG tablet Take 1 tablet (50 mg total) by mouth 3 (three) times daily. (Patient taking differently: Take 25 mg by mouth daily as needed (high blood pressure). ) 90 tablet 11   No current facility-administered medications for this visit.      Past Medical History:  Diagnosis Date  . Anemia    microcytic   . Arthritis   . Atrial fibrillation ([place])   . Cataract    REMOVED BILATERAL  . Colon polyps   . Diabetes mellitus ([place])   . Fibromyalgia   . Gastroparesis   . Hyperlipidemia   . Hypertension   . Kidney stones   . Melanoma ([place])   . PMR (polymyalgia rheumatica) ([facility])   . PVD (peripheral vascular disease) ([place])     Past Surgical History:  Procedure Laterality Date  . APPENDECTOMY    . BREAST BIOPSY    . CESAREAN SECTION    . COLONOSCOPY    . COLONOSCOPY WITH PROPOFOL N/A 12/26/2015   Procedure: COLONOSCOPY WITH PROPOFOL;  Surgeon: Manus Gunning, MD;  [facility];  Service: Gastroenterology;  Laterality: N/A;  . ESOPHAGOGASTRODUODENOSCOPY (EGD) WITH PROPOFOL N/A 12/26/2015  Procedure: ESOPHAGOGASTRODUODENOSCOPY (EGD) WITH PROPOFOL;  Surgeon: Manus Gunning, MD;  [facility];  Service: Gastroenterology;  Laterality: N/A;  . LEFT HEART CATH AND CORONARY ANGIOGRAPHY N/A 06/03/2017   Procedure: Left Heart Cath and Coronary Angiography;  Surgeon: Martinique, Peter M, MD;  Location: [facility];  Service: Cardiovascular;  Laterality: N/A;  . POLYPECTOMY      Social History   Socioeconomic  History  . Marital status: Widowed    Spouse name: Not on file  . Number of children: 1  . Years of education: Not on file  . Highest education level: Not on file  Occupational History  . Not on file  Social Needs  . Financial resource strain: Not on file  . Food insecurity:    Worry: Not on file    Inability: Not on file  . Transportation needs:    Medical: Not on file    Non-medical: Not on file  Tobacco Use  . Smoking status: Current Every Day Smoker    Packs/day: 1.00    Types: Cigarettes  . Smokeless tobacco: Never Used  Substance and Sexual Activity  . Alcohol use: No    Alcohol/week: 0.0 standard drinks  . Drug use: No  . Sexual activity: Not on file  Lifestyle  . Physical activity:    Days per week: Not on file    Minutes per session: Not on file  . Stress: Not on file  Relationships  . Social connections:    Talks on phone: Not on file    Gets together: Not on file    Attends religious service: Not on file    Active member of club or organization: Not on file    Attends meetings of clubs or organizations: Not on file    Relationship status: Not on file  . Intimate partner violence:    Fear of current or ex partner: Not on file    Emotionally abused: Not on file    Physically abused: Not on file    Forced sexual activity: Not on file  Other Topics Concern  . Not on file  Social History Narrative  . Not on file    Family History  Problem Relation Age of Onset  . CAD Father        MI and CABG  . Diabetes Father   . Heart disease Father   . Colon cancer Mother 48    ROS: no fevers or chills, productive cough, hemoptysis, dysphasia, odynophagia, melena, hematochezia, dysuria, hematuria, rash, seizure activity, orthopnea, PND, pedal edema, claudication. Remaining systems are negative.  Physical Exam: Well-developed well-nourished in no acute distress.  Skin is warm and dry.  HEENT is normal.  Neck is supple.  Chest is clear to auscultation with  normal expansion.  Cardiovascular exam is regular rate and rhythm.  Abdominal exam nontender or distended. No masses palpated. Extremities show no edema. neuro grossly intact  ECG-sinus bradycardia at a rate of 59.  No ST changes.  Personally reviewed  A/P  1 paroxysmal atrial fibrillation-patient is in sinus rhythm on examination today.  Continue Xarelto at present dose.  Continue metoprolol for rate control if atrial fibrillation recurs.  2 hypertension-patient's blood pressure is controlled.  Continue present medications and follow.  3 hyperlipidemia-continue pravastatin.  She did not tolerate higher doses previously.  4 history of takotsubo cardiomyopathy-LV function has improved on most recent echocardiogram.  5 tobacco abuse-patient counseled on discontinuing.  6 carotid artery disease-await follow-up carotid Dopplers performed  today.  7 peripheral vascular disease-continue statin.  No aspirin given need for anticoagulation.  8 history of question renal mass-arrange renal protocol MRI.  Kirk Ruths, MD

## 2018-11-10 ENCOUNTER — Ambulatory Visit (HOSPITAL_COMMUNITY)
Admission: RE | Admit: 2018-11-10 | Discharge: 2018-11-10 | Disposition: A | Discharge status: Home / Self Care | Payer: Medicare Other | Source: Ambulatory Visit | Attending: Internal Medicine | Admitting: Internal Medicine

## 2018-11-10 ENCOUNTER — Telehealth: Payer: Self-pay

## 2018-11-10 ENCOUNTER — Encounter: Payer: Self-pay | Admitting: Cardiology

## 2018-11-10 ENCOUNTER — Ambulatory Visit: Payer: Medicare Other | Admitting: Cardiology

## 2018-11-10 VITALS — BP 138/62 | HR 59 | Ht 64.0 in | Wt 160.0 lb

## 2018-11-10 DIAGNOSIS — I480 Paroxysmal atrial fibrillation: Secondary | ICD-10-CM | POA: Diagnosis not present

## 2018-11-10 DIAGNOSIS — Z23 Encounter for immunization: Secondary | ICD-10-CM | POA: Diagnosis not present

## 2018-11-10 DIAGNOSIS — I6523 Occlusion and stenosis of bilateral carotid arteries: Secondary | ICD-10-CM | POA: Diagnosis not present

## 2018-11-10 DIAGNOSIS — I10 Essential (primary) hypertension: Secondary | ICD-10-CM | POA: Diagnosis not present

## 2018-11-10 DIAGNOSIS — E7800 Pure hypercholesterolemia, unspecified: Secondary | ICD-10-CM

## 2018-11-10 DIAGNOSIS — N281 Cyst of kidney, acquired: Secondary | ICD-10-CM

## 2018-11-10 NOTE — Patient Instructions (Signed)
Medication Instructions:  Your physician recommends that you continue on your current medications as directed. Please refer to the Current Medication list given to you today.  If you need a refill on your cardiac medications before your next appointment, please call your pharmacy.   Lab work: Art gallery manager  If you have labs (blood work) drawn today and your tests are completely normal, you will receive your results only by: Marland Kitchen MyChart Message (if you have MyChart) OR . A paper copy in the mail If you have any lab test that is abnormal or we need to change your treatment, we will call you to review the results.  Testing/Procedures: Renal Protocol MRI- we will call you with the information  Follow-Up: At [facility], you and your health needs are our priority.  As part of our continuing mission to provide you with exceptional heart care, we have created designated Provider Care Teams.  These Care Teams include your primary Cardiologist (physician) and Advanced Practice Providers (APPs -  Physician Assistants and Nurse Practitioners) who all work together to provide you with the care you need, when you need it. You will need a follow up appointment in 6 months.  Please call our office 2 months in advance to schedule this appointment.  You may see Kirk Ruths, MD  or one of the following Advanced Practice Providers on your designated Care Team:   Kerin Ransom, PA-C Roby Lofts, [place] . Sande Rives, PA-C  Any Other Special Instructions Will Be Listed Below (If Applicable).

## 2018-11-10 NOTE — Telephone Encounter (Signed)
Pt seen today by Dr.Crenshaw. Per Dr.Crenshaw  Pt needs have a MRI of the abd w/ wo contract with renal protocol.  Spoke with Dr.Edmunf in Radiology to confirm that it should be ordered w/wo contrast.  Order placed in [place] for [employer]. Call and spoke with pt. Provider the tel # for GI scheduling. Pt aware pt  will call to schedule.

## 2018-11-10 NOTE — Addendum Note (Signed)
Addended by: Lamar Laundry on: 11/10/2018 01:08 PM   Modules accepted: Orders

## 2018-11-11 ENCOUNTER — Other Ambulatory Visit: Payer: Self-pay

## 2018-11-11 DIAGNOSIS — I6523 Occlusion and stenosis of bilateral carotid arteries: Secondary | ICD-10-CM

## 2018-11-11 LAB — BASIC METABOLIC PANEL
BUN/Creatinine Ratio: 24 (ref 12–28)
BUN: 24 mg/dL (ref 8–27)
CALCIUM: 9.6 mg/dL (ref 8.7–10.3)
CHLORIDE: 103 mmol/L (ref 96–106)
CO2: 24 mmol/L (ref 20–29)
Creatinine, Ser: 0.98 mg/dL (ref 0.57–1.00)
GFR calc Af Amer: 64 mL/min/{1.73_m2} (ref >59)
GFR calc non Af Amer: 56 mL/min/{1.73_m2} — ABNORMAL LOW (ref >59)
GLUCOSE: 79 mg/dL (ref 65–99)
Potassium: 4.9 mmol/L (ref 3.5–5.2)
Sodium: 142 mmol/L (ref 134–144)

## 2018-11-18 LAB — HEMOGLOBIN A1C: HEMOGLOBIN A1C: 5.6

## 2018-11-25 ENCOUNTER — Other Ambulatory Visit: Payer: Medicare Other

## 2018-12-08 ENCOUNTER — Ambulatory Visit
Admission: RE | Admit: 2018-12-08 | Discharge: 2018-12-08 | Disposition: A | Discharge status: Home / Self Care | Payer: Medicare Other | Source: Ambulatory Visit | Attending: Cardiology | Admitting: Cardiology

## 2018-12-08 DIAGNOSIS — N281 Cyst of kidney, acquired: Secondary | ICD-10-CM

## 2018-12-08 MED ORDER — GADOBENATE DIMEGLUMINE 529 MG/ML IV SOLN
14.0000 mL | Freq: Once | INTRAVENOUS | Status: AC | PRN
  Administered 2018-12-08: 14 mL via INTRAVENOUS

## 2018-12-10 ENCOUNTER — Encounter: Payer: Self-pay | Admitting: Family Medicine

## 2018-12-10 ENCOUNTER — Ambulatory Visit (INDEPENDENT_AMBULATORY_CARE_PROVIDER_SITE_OTHER): Payer: Medicare Other | Admitting: Family Medicine

## 2018-12-10 VITALS — BP 148/68 | HR 50 | Temp 97.8°F | Ht 64.0 in | Wt 161.0 lb

## 2018-12-10 DIAGNOSIS — I480 Paroxysmal atrial fibrillation: Secondary | ICD-10-CM

## 2018-12-10 DIAGNOSIS — M353 Polymyalgia rheumatica: Secondary | ICD-10-CM

## 2018-12-10 DIAGNOSIS — E119 Type 2 diabetes mellitus without complications: Secondary | ICD-10-CM

## 2018-12-10 DIAGNOSIS — C4371 Malignant melanoma of right lower limb, including hip: Secondary | ICD-10-CM | POA: Diagnosis not present

## 2018-12-10 DIAGNOSIS — IMO0001 Reserved for inherently not codable concepts without codable children: Secondary | ICD-10-CM

## 2018-12-10 DIAGNOSIS — Z794 Long term (current) use of insulin: Secondary | ICD-10-CM

## 2018-12-10 DIAGNOSIS — C439 Malignant melanoma of skin, unspecified: Secondary | ICD-10-CM | POA: Insufficient documentation

## 2018-12-10 NOTE — Patient Instructions (Signed)
-  Great to meet you today -Let me know if you need refills of any medications prior to your next appointment.  -I will see you back in about 6 months or sooner if needed.

## 2018-12-10 NOTE — Assessment & Plan Note (Signed)
-  RRR on exam today -Rate controlled with metoprolol -Continue xarelto for anticoagulation -She will continue to follow with cardiology.

## 2018-12-10 NOTE — Assessment & Plan Note (Signed)
-  Followed by Rheumatology (Dr. Lenna Gilford), does not do well with steroids.  Stable symptoms at this time.

## 2018-12-10 NOTE — Assessment & Plan Note (Addendum)
-  Initial episode treated with excision -Had several biopsies recently, suture removed from R thigh without difficulty today.

## 2018-12-10 NOTE — Progress Notes (Signed)
Kathleen Griffith - 77 y.o. female MRN [id number]  Date of birth: [birth year]  Subjective Chief Complaint  Patient presents with  . Establish Care    Denies changes in health-has chronic back pain-has done PT before    HPI Kathleen Griffith is a 77 y.o. female with history of HTN, T2DM, PMR and fibromyalgia, A. Fib, and melanoma here today to establish with new PCP.  She was most recently seen by her previous PCP <1 month ago.    -HTN/A. Fib:  History of PAF rate controlled with metoprolol.  She had episode a couple of months ago during hospitalization for UTI.   She is anticoagulated with xarelto and follows with cardiology.  She denies any symptoms at this time including palpitations, shortness of breath, fatigue, or increased edema.   -T2DM:  Treatment with lantus 35 units daily.  She is doing well with this and had to reduce her insulin dose due to episodes of hypoglycemia.  Her A1c from last month was 5.6%.  Her average blood sugars currently are around 80-90.    -Melanoma:  History of melanoma removed from R leg.  Recently had several other biopsies and needs suture removed from punch biopsy to R leg.  She denies any pain, bleeding or drainage.   -PMR/FM:  Follows with rheumatology.  Previously treated with steroids however has been tapered off of this.  She continues to have pain in her back and joints but not felt to be related to PMR.  She denies any worsening of pain recently and is using tylenol as needed for pain control.   ROS:  A comprehensive ROS was completed and negative except as noted per HPI  Allergies  Allergen Reactions  . Anoro Ellipta [Umeclidinium-Vilanterol] Palpitations  . Losartan Shortness Of Breath    wheezing wheezing  . Norvasc [Amlodipine Besylate] Swelling  . Pregabalin Swelling    TO LOWER EXTREMTIES TO LOWER EXTREMTIES  . Bupropion     Other reaction(s): Other Sucidal thoughts  . Metoclopramide Other (See Comments)    Tardive dyskinesia, tremors  .  Lisinopril Cough  . Metformin Diarrhea  . Metformin And Related Diarrhea    Past Medical History:  Diagnosis Date  . Anemia    microcytic   . Arthritis   . Atrial fibrillation ([place])   . Cataract    REMOVED BILATERAL  . Colon polyps   . Diabetes mellitus ([place])   . Fibromyalgia   . Gastroparesis   . Hyperlipidemia   . Hypertension   . Kidney stones   . Melanoma ([place])   . PMR (polymyalgia rheumatica) (HCC)   . PVD (peripheral vascular disease) ([place])     Past Surgical History:  Procedure Laterality Date  . APPENDECTOMY    . BREAST BIOPSY    . CESAREAN SECTION    . COLONOSCOPY    . COLONOSCOPY WITH PROPOFOL N/A 12/26/2015   Procedure: COLONOSCOPY WITH PROPOFOL;  Surgeon: Manus Gunning, MD;  Location: [facility];  Service: Gastroenterology;  Laterality: N/A;  . ESOPHAGOGASTRODUODENOSCOPY (EGD) WITH PROPOFOL N/A 12/26/2015   Procedure: ESOPHAGOGASTRODUODENOSCOPY (EGD) WITH PROPOFOL;  Surgeon: Manus Gunning, MD;  Location: [facility];  Service: Gastroenterology;  Laterality: N/A;  . LEFT HEART CATH AND CORONARY ANGIOGRAPHY N/A 06/03/2017   Procedure: Left Heart Cath and Coronary Angiography;  Surgeon: Martinique, Peter M, MD;  Location: [place] [facility];  Service: Cardiovascular;  Laterality: N/A;  . POLYPECTOMY      Social History   Socioeconomic History  .  Marital status: Widowed    Spouse name: Not on file  . Number of children: 1  . Years of education: Not on file  . Highest education level: Not on file  Occupational History  . Not on file  Social Needs  . Financial resource strain: Not on file  . Food insecurity:    Worry: Not on file    Inability: Not on file  . Transportation needs:    Medical: Not on file    Non-medical: Not on file  Tobacco Use  . Smoking status: Current Every Day Smoker    Packs/day: 1.00    Types: Cigarettes  . Smokeless tobacco: Never Used  Substance and Sexual Activity  . Alcohol use: No    Alcohol/week: 0.0  standard drinks  . Drug use: No  . Sexual activity: Not on file  Lifestyle  . Physical activity:    Days per week: Not on file    Minutes per session: Not on file  . Stress: Not on file  Relationships  . Social connections:    Talks on phone: Not on file    Gets together: Not on file    Attends religious service: Not on file    Active member of club or organization: Not on file    Attends meetings of clubs or organizations: Not on file    Relationship status: Not on file  Other Topics Concern  . Not on file  Social History Narrative  . Not on file    Family History  Problem Relation Age of Onset  . CAD Father        MI and CABG  . Diabetes Father   . Heart disease Father   . Colon cancer Mother 76    Health Maintenance  Topic Date Due  . FOOT EXAM  01/29/1951  . OPHTHALMOLOGY EXAM  01/29/1951  . URINE MICROALBUMIN  01/29/1951  . TETANUS/TDAP  01/30/1960  . DEXA SCAN  01/29/2006  . PNA vac Low Risk Adult (1 of 2 - PCV13) 01/29/2006  . HEMOGLOBIN A1C  12/02/2017  . INFLUENZA VACCINE  Completed    ----------------------------------------------------------------------------------------------------------------------------------------------------------------------------------------------------------------- Physical Exam BP (!) 148/68   Pulse (!) 50   Temp 97.8 F (36.6 C) (Oral)   Ht 5\' 4"  (1.626 m)   Wt 161 lb (73 kg)   SpO2 99%   BMI 27.64 kg/m   Physical Exam Constitutional:      Appearance: Normal appearance.  HENT:     Head: Normocephalic and atraumatic.     Mouth/Throat:     Mouth: Mucous membranes are moist.  Eyes:     General: No scleral icterus. Neck:     Musculoskeletal: Normal range of motion and neck supple.  Cardiovascular:     Rate and Rhythm: Normal rate and regular rhythm.     Pulses: Normal pulses.     Heart sounds: Normal heart sounds.  Pulmonary:     Effort: Pulmonary effort is normal.     Breath sounds: Normal breath sounds.    Skin:    General: Skin is warm and dry.     Comments: Single suture to right thigh.  Well healed, no bleeding or erythema.  Suture removed without difficulty.   Neurological:     General: No focal deficit present.     Mental Status: She is alert.  Psychiatric:        Mood and Affect: Mood normal.        Behavior: Behavior normal.     -------------------------------------------------------------------------------------------------------------------------------------------------------------------------------------------------------------------  Assessment and Plan  Polymyalgia rheumatica ([place]) -Followed by Rheumatology (Dr. Lenna Gilford), does not do well with steroids.  Stable symptoms at this time.    Insulin dependent diabetes mellitus ([place]) -Well controlled based on last a1c, has cut back some slightly on insulin due to episode of hypoglycemia.    PAF (paroxysmal atrial fibrillation) (HCC) -RRR on exam today -Rate controlled with metoprolol -Continue xarelto for anticoagulation -She will continue to follow with cardiology.   Melanoma ([place]) -Initial episode treated with excision -Had several biopsies recently, suture removed from R thigh without difficulty today.

## 2018-12-10 NOTE — Assessment & Plan Note (Signed)
-  Well controlled based on last a1c, has cut back some slightly on insulin due to episode of hypoglycemia.

## 2018-12-17 ENCOUNTER — Telehealth: Payer: Self-pay

## 2018-12-17 ENCOUNTER — Other Ambulatory Visit: Payer: Self-pay | Admitting: Family Medicine

## 2018-12-17 MED ORDER — INSULIN GLARGINE 100 UNIT/ML ~~LOC~~ SOLN: 15.0000 [IU] | Freq: Every day | SUBCUTANEOUS | 11 refills | Status: DC

## 2018-12-17 NOTE — Telephone Encounter (Signed)
Copied from [place] [id number]. Topic: Quick Communication - Rx Refill/Question >> Dec 17, 2018 10:27 AM Leward Quan A wrote: Medication: LANTUS SOLOSTAR 100 UNIT/ML Solostar Pen  Has the patient contacted their pharmacy? Yes.   (Agent: If no, request that the patient contact the pharmacy for the refill.) (Agent: If yes, when and what did the pharmacy advise?)  Preferred Pharmacy (with phone number or street name): [id number] - [place], [address] AT [address] [phone] (Phone) [phone] (Fax)    Agent: Please be advised that RX refills may take up to 3 business days. We ask that you follow-up with your pharmacy.

## 2018-12-17 NOTE — Telephone Encounter (Signed)
Requested medication (s) are due for refill today: Yes  Requested medication (s) are on the active medication list: Yes  Last refill:  06/06/17  Future visit scheduled: Yes  Notes to clinic:  Unable to refill per protocol, expired medication, last refilled by another provider, failed labs.     Requested Prescriptions  Pending Prescriptions Disp Refills   insulin glargine (LANTUS) 100 UNIT/ML injection 10 mL 11    Sig: Inject 0.15 mLs (15 Units total) into the skin at bedtime.     Endocrinology:  Diabetes - Insulins Failed - 12/17/2018 11:43 AM      Failed - HBA1C is between 0 and 7.9 and within 180 days    Hgb A1c MFr Bld  Date Value Ref Range Status  06/02/2017 9.4 (H) 4.8 - 5.6 % Final    Comment:    (NOTE)         Pre-diabetes: 5.7 - 6.4         Diabetes: >6.4         Glycemic control for adults with diabetes: <7.0          Passed - Valid encounter within last 6 months    Recent Outpatient Visits          1 week ago PAF (paroxysmal atrial fibrillation) ([place])   [facility] Prosper Blairs, Lee Mont, DO      Future Appointments            In 5 months Luetta Nutting, DO [facility], [facility]

## 2018-12-17 NOTE — Telephone Encounter (Signed)
Copied from [place] [id number]. Topic: General - Inquiry >> Dec 17, 2018  9:45 AM Virl Axe D wrote: Reason for CRM: Glenna with [employer] called to confirm diagnosis for pt who has signed up with them. Does not need diagnosis code, only confirmation of Diabetes or Congestive Heart Failure. Please advise. CB# [phone] >> Dec 17, 2018  9:58 AM Para Skeans A wrote: Wrong office

## 2018-12-17 NOTE — Telephone Encounter (Signed)
Copied from [place] [phone]. Topic: General - Inquiry >> Dec 17, 2018  9:45 AM Virl Axe D wrote: Reason for CRM: Glenna with [employer] called to confirm diagnosis for pt who has signed up with them. Does not need diagnosis code, only confirmation of Diabetes or Congestive Heart Failure. Please advise. CB# [phone] >> Dec 17, 2018  9:58 AM Para Skeans A wrote: Wrong office

## 2018-12-17 NOTE — Telephone Encounter (Signed)
HT[facility] will fax form that needs to be completed.

## 2018-12-18 NOTE — Telephone Encounter (Signed)
Form faxed

## 2018-12-21 ENCOUNTER — Other Ambulatory Visit: Payer: Self-pay | Admitting: Cardiology

## 2018-12-21 ENCOUNTER — Other Ambulatory Visit: Payer: Self-pay | Admitting: Family Medicine

## 2018-12-21 MED ORDER — INSULIN GLARGINE 100 UNIT/ML SOLOSTAR PEN: 35.0000 [IU] | PEN_INJECTOR | Freq: Every day | SUBCUTANEOUS | 2 refills | Status: DC

## 2019-01-14 ENCOUNTER — Other Ambulatory Visit: Payer: Self-pay | Admitting: Cardiology

## 2019-01-14 ENCOUNTER — Telehealth: Payer: Self-pay | Admitting: Cardiology

## 2019-01-14 NOTE — Telephone Encounter (Signed)
Refill on refill pool

## 2019-01-14 NOTE — Telephone Encounter (Signed)
New Message    *STAT* If patient is at the pharmacy, call can be transferred to refill team.   1. Which medications need to be refilled? (please list name of each medication and dose if known)  XARELTO 20 MG TABS tablet  2. Which pharmacy/location (including street and city if local pharmacy) is medication to be sent to? [id number] - [place], [place] - [address] AT [address]  3. Do they need a 30 day or 90 day supply? 90 day

## 2019-01-26 DIAGNOSIS — M353 Polymyalgia rheumatica: Secondary | ICD-10-CM | POA: Diagnosis not present

## 2019-01-26 DIAGNOSIS — M150 Primary generalized (osteo)arthritis: Secondary | ICD-10-CM | POA: Diagnosis not present

## 2019-01-26 DIAGNOSIS — M48061 Spinal stenosis, lumbar region without neurogenic claudication: Secondary | ICD-10-CM | POA: Diagnosis not present

## 2019-01-26 DIAGNOSIS — Z6827 Body mass index (BMI) 27.0-27.9, adult: Secondary | ICD-10-CM | POA: Diagnosis not present

## 2019-01-26 DIAGNOSIS — R768 Other specified abnormal immunological findings in serum: Secondary | ICD-10-CM | POA: Diagnosis not present

## 2019-01-26 DIAGNOSIS — M2550 Pain in unspecified joint: Secondary | ICD-10-CM | POA: Diagnosis not present

## 2019-01-26 DIAGNOSIS — E663 Overweight: Secondary | ICD-10-CM | POA: Diagnosis not present

## 2019-01-26 DIAGNOSIS — M810 Age-related osteoporosis without current pathological fracture: Secondary | ICD-10-CM | POA: Diagnosis not present

## 2019-02-12 IMAGING — CT CT ANGIO AOBIFEM WO/W CM
1 of 16 series · 11 of 48 positions shown, 16 images · IV contrast (iopamidol)
Comparison: None.

CLINICAL DATA: 77-year-old female with right lower extremity pain,
redness and swelling for the past 6-8 weeks. Evaluate for peripheral
arterial disease.

EXAM:
CT ANGIOGRAPHY OF ABDOMINAL AORTA WITH ILIOFEMORAL RUNOFF
TECHNIQUE: Multidetector CT imaging of the abdomen, pelvis and lower
extremities was performed using the standard protocol during bolus
administration of intravenous contrast. Multiplanar CT image
reconstructions and MIPs were obtained to evaluate the vascular
anatomy.
CONTRAST:  125mL [id number]-[id number] IOPAMIDOL ([id number]-[id number]) INJECTION 61%

[Series 4: angiorunoff 3.0 i40f 3 · axial · 0.85mm/px · z∈[-812,-68]mm · 11 of 282 slices shown, 16 images]
[im 17/282  soft-tissue]
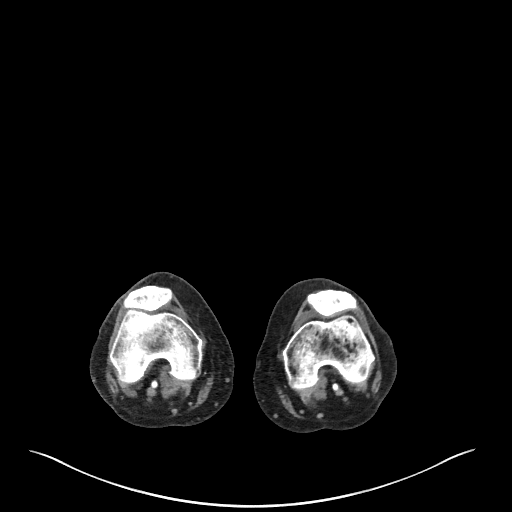
[im 17/282  bone]
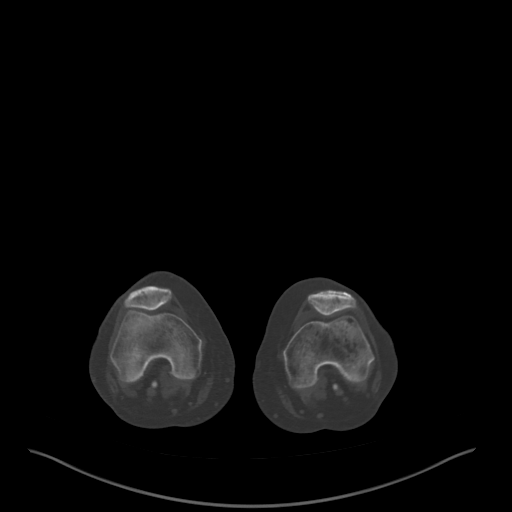
[im 50/282  soft-tissue]
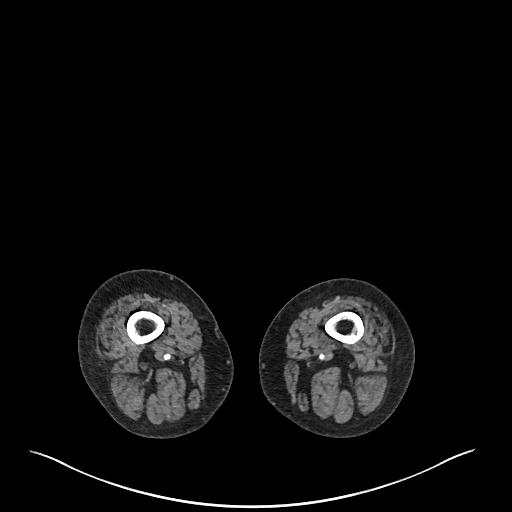
[im 83/282  soft-tissue]
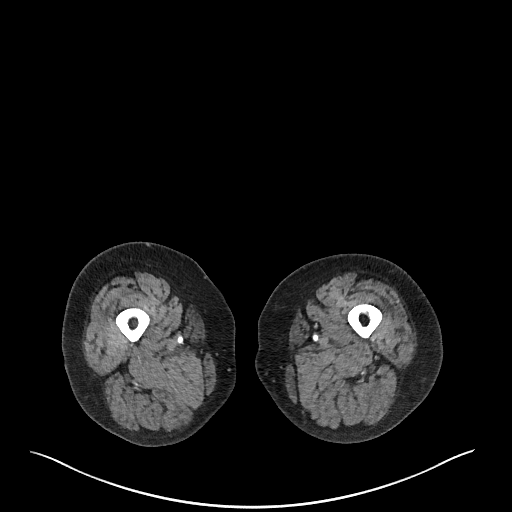
[im 100/282  soft-tissue]
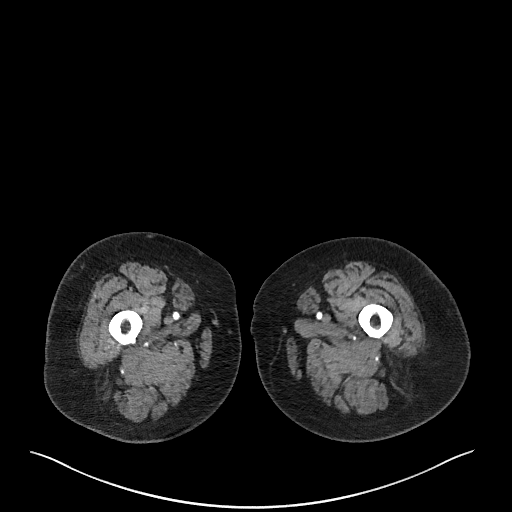
[im 133/282  soft-tissue]
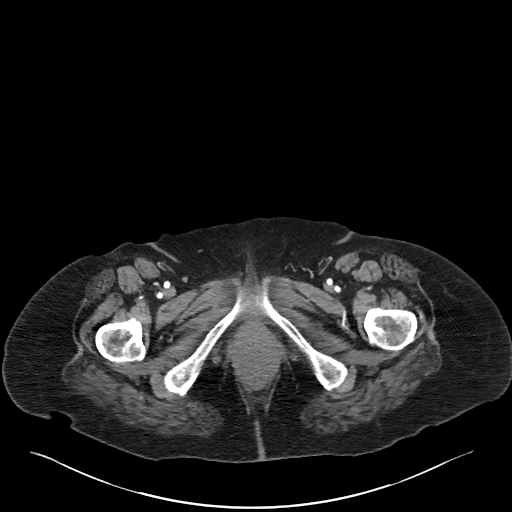
[im 149/282  soft-tissue]
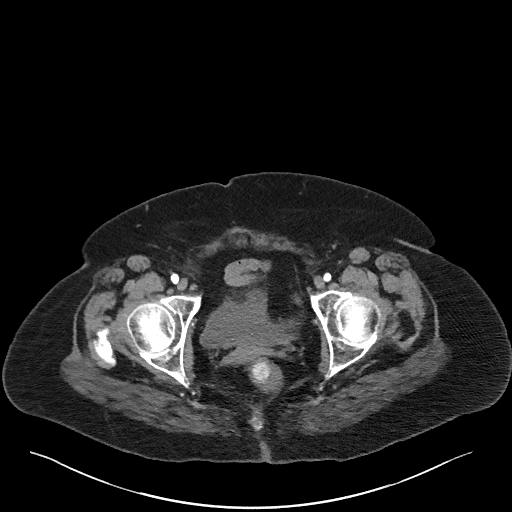
[im 182/282  soft-tissue]
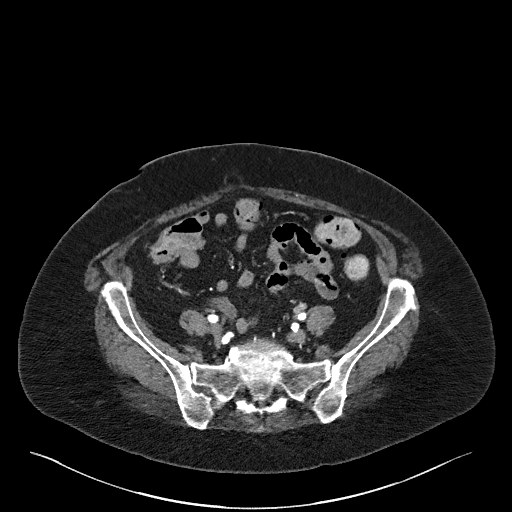
[im 215/282  soft-tissue]
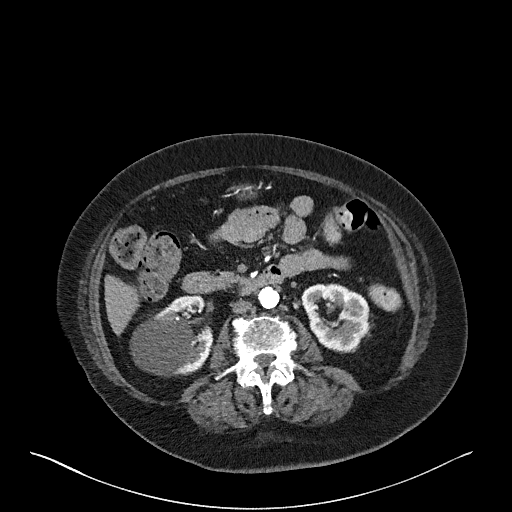
[im 215/282  lung]
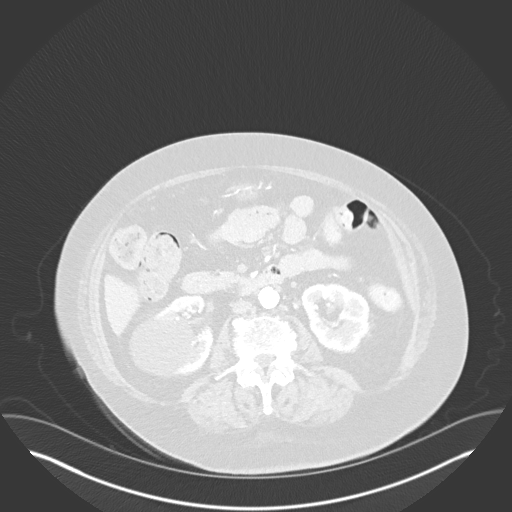
[im 232/282  soft-tissue]
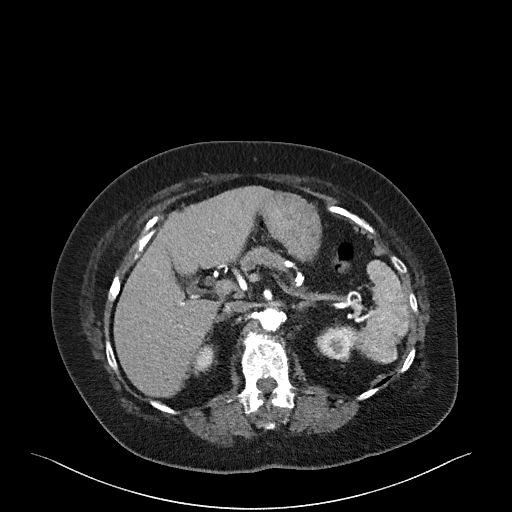
[im 232/282  lung]
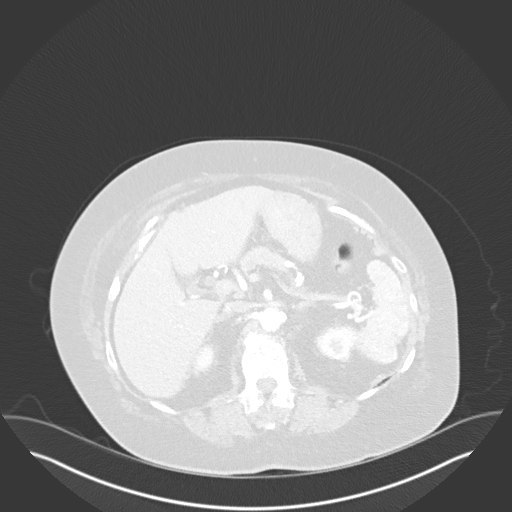
[im 232/282  bone]
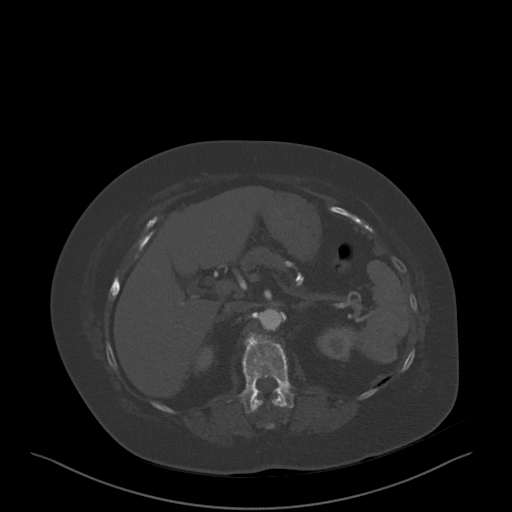
[im 248/282  lung]
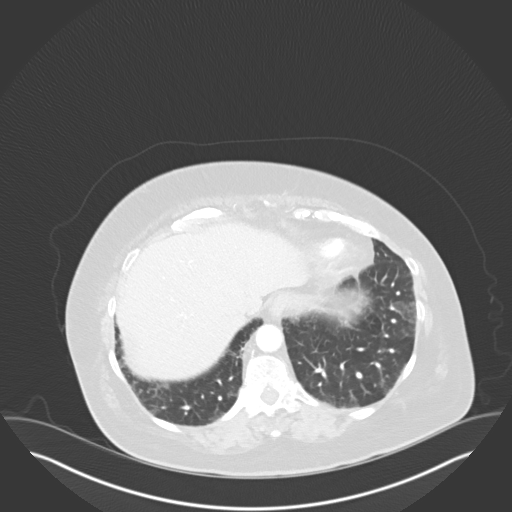
[im 265/282  soft-tissue]
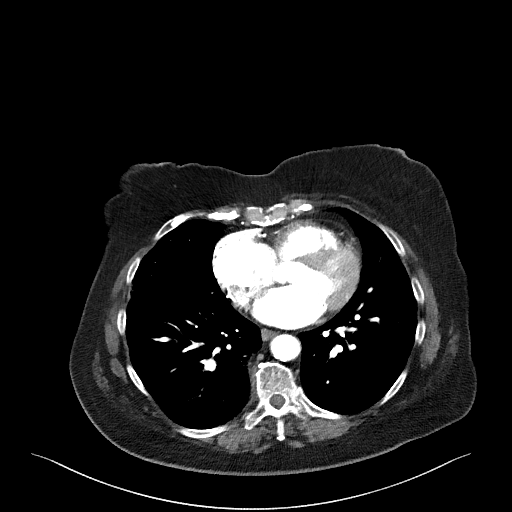
[im 265/282  lung]
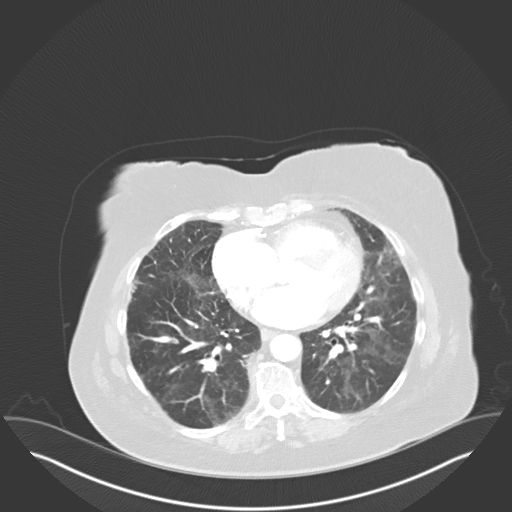

[11 of 48 positions shown; findings below may reference images not displayed]

FINDINGS: VASCULAR

Aorta: Heterogeneous atherosclerotic plaque without evidence of
aneurysm or dissection.

Celiac: Patent without evidence of aneurysm, dissection, vasculitis
or significant stenosis.

SMA: Focal bulky calcified atherosclerotic plaque at the origin
results in approximately 50% narrowing. The vessel is patent
distally.

Renals: Mild heterogeneous plaque without significant stenosis. No
aneurysm, dissection or changes of fibromuscular dysplasia. Single
renal arteries bilaterally.

IMA: Patent without evidence of aneurysm, dissection, vasculitis or
significant stenosis.

RIGHT Lower Extremity

Inflow: Calcified atherosclerotic plaque without significant
stenosis. The internal iliac artery remains patent. The external
iliac artery is relatively spared from disease.

Outflow: Mild calcified plaque along the posterior wall of the
common femoral artery. No significant stenosis. The profunda femoral
branches are widely patent. The superficial femoral artery is mildly
diseased. No significant stenosis or occlusion. The popliteal artery
is widely patent.

Runoff: Patent 3 vessel runoff to the ankle.

LEFT Lower Extremity

Inflow: Heterogeneous atherosclerotic plaque throughout the common
iliac artery without evidence of significant stenosis. The internal
iliac artery demonstrates a focal high-grade stenosis at the origin
but remains patent. The external iliac artery is relatively spared
from disease.

Outflow: Minimal atherosclerotic plaque without significant
stenosis. The profunda femoral branches are widely patent. The
superficial femoral artery is mildly diseased but widely patent. No
significant stenosis or occlusion. The popliteal artery is widely
patent.

Runoff: Three-vessel runoff to the ankle.

Veins: No focal venous abnormality.

Review of the MIP images confirms the above findings.

NON-VASCULAR

Lower chest: Calcified plaque along the left anterior descending
coronary artery. Mild right atrial dilatation. Otherwise, the heart
is normal in size. No pericardial effusion. Multifocal areas of
patchy ground-glass attenuation airspace opacity throughout the
visualized lower lungs. Calcified granuloma in the left lower lobe
noted incidentally. No pleural effusion.

Hepatobiliary: Normal hepatic contour and morphology. No discrete
hepatic lesions. Normal appearance of the gallbladder. No intra or
extrahepatic biliary ductal dilatation.

Pancreas: Unremarkable. No pancreatic ductal dilatation or
surrounding inflammatory changes.

Spleen: Normal in size without focal abnormality.

Adrenals/Urinary Tract: Normal adrenal glands. No enhancing renal
mass, hydronephrosis or nephrolithiasis. Large 6 cm simple cyst in
the interpolar right kidney. A 0.9 cm intermediate attenuation
lesion in the interpolar left kidney is too small for accurate
characterization but not consistent with a simple cyst. Unremarkable
ureters and bladder.

Stomach/Bowel: Colonic diverticular disease without CT evidence of
active inflammation. No focal bowel wall thickening or evidence of
obstruction.

Lymphatic: No suspicious lymphadenopathy.

Reproductive: Uterus and bilateral adnexa are unremarkable.

Other: No abdominal wall hernia or abnormality. No abdominopelvic
ascites.

Musculoskeletal: No acute fracture or aggressive appearing lytic or
blastic osseous lesion. Skin thickening and reticulation of the
subcutaneous fat anteriorly, laterally and posteriorly in the right
lower leg from the knee to the ankle. No focal fluid collection.
Chronic appearing L1 compression fracture with approximately 20%
height loss anteriorly.
IMPRESSION: VASCULAR

1. Extensive atherosclerotic vascular calcifications without
evidence of a hemodynamically significant stenosis or occlusion in
either lower extremity.
2. Probable focal moderate stenosis at the origin of the superior
mesenteric artery secondary to bulky calcified atherosclerotic
plaque.
3. Coronary artery calcifications.
4. Mild right atrial dilatation.

NON-VASCULAR

1. A 0.9 cm intermediate attenuation lesion in the interpolar left
kidney is too small for accurate characterization but not consistent
with a simple cyst. Differential considerations include complex cyst
and small renal neoplasm. Recommend further evaluation with renal
protocol MRI in 6 months.
2. Skin thickening and stranding throughout the subcutaneous fat of
the right lower extremity from just below the knee to the ankle.
Findings are most consistent with either edema or cellulitis. No
focal fluid collection.
3. Colonic diverticular disease without CT evidence of active
inflammation.
4. 6 cm right interpolar simple renal cyst.
5. Chronic appearing L1 compression fracture with approximately 20%
height loss anteriorly.

These results will be called to the ordering clinician or
representative by the Radiologist Assistant, and communication
documented in the PACS or zVision Dashboard.

## 2019-02-15 ENCOUNTER — Other Ambulatory Visit: Payer: Self-pay

## 2019-02-15 NOTE — Patient Outreach (Signed)
  [facility]) Care Management Chronic Special Needs Program   02/15/2019  Name: Kathleen Griffith, DOB: 12-11-1941  MRN: [id number]  The client was discussed in 02/12/19's interdisciplinary care team meeting.The client's individualized care plan was developed based on completed Health Risk Assessment   The following issues were discussed:  Key risk triggers/risk stratification, Care Plan and Issues/barriers to care  Participants present:  Mahlon Gammon, MSN, RN, CCM, CNS    Thea Silversmith, MSN, RN, CCM   Kemper Heupel RN,BSN,CCM, CDE       Recommendations:  Send Smoking Cessation information, Education on HTN, A.fib, and  MI  Plan:  Plan to send copy of individualized care plan to client Plan to send individualized care plan to provider. Chronic Care Management Coordinator will outreach in 2-4 months  Follow-up:  2-4 months  Peter Garter RN, Jackquline Denmark, CDE Chronic Care Management Coordinator [facility] [phone]

## 2019-02-24 ENCOUNTER — Ambulatory Visit (INDEPENDENT_AMBULATORY_CARE_PROVIDER_SITE_OTHER): Payer: HMO | Admitting: Family Medicine

## 2019-02-24 ENCOUNTER — Encounter: Payer: Self-pay | Admitting: Family Medicine

## 2019-02-24 VITALS — BP 148/76 | HR 65 | Temp 98.1°F | Ht 64.0 in | Wt 162.0 lb

## 2019-02-24 DIAGNOSIS — R5383 Other fatigue: Secondary | ICD-10-CM | POA: Diagnosis not present

## 2019-02-24 DIAGNOSIS — F329 Major depressive disorder, single episode, unspecified: Secondary | ICD-10-CM

## 2019-02-24 DIAGNOSIS — M810 Age-related osteoporosis without current pathological fracture: Secondary | ICD-10-CM | POA: Diagnosis not present

## 2019-02-24 LAB — COMPREHENSIVE METABOLIC PANEL
ALK PHOS: 70 U/L (ref 39–117)
ALT: 17 U/L (ref 0–35)
AST: 20 U/L (ref 0–37)
Albumin: 3.8 g/dL (ref 3.5–5.2)
BILIRUBIN TOTAL: 0.5 mg/dL (ref 0.2–1.2)
BUN: 31 mg/dL — AB (ref 6–23)
CO2: 28 meq/L (ref 19–32)
Calcium: 9.2 mg/dL (ref 8.4–10.5)
Chloride: 105 mEq/L (ref 96–112)
Creatinine, Ser: 1 mg/dL (ref 0.40–1.20)
GFR: 53.61 mL/min — AB (ref >60.00)
GLUCOSE: 81 mg/dL (ref 70–99)
Potassium: 4.1 mEq/L (ref 3.5–5.1)
SODIUM: 139 meq/L (ref 135–145)
TOTAL PROTEIN: 7.3 g/dL (ref 6.0–8.3)

## 2019-02-24 LAB — CBC
HCT: 42.2 % (ref 36.0–46.0)
Hemoglobin: 14.1 g/dL (ref 12.0–15.0)
MCHC: 33.5 g/dL (ref 30.0–36.0)
MCV: 90.3 fl (ref 78.0–100.0)
PLATELETS: 288 10*3/uL (ref 150.0–400.0)
RBC: 4.67 Mil/uL (ref 3.87–5.11)
RDW: 14.6 % (ref 11.5–15.5)
WBC: 7.8 10*3/uL (ref 4.0–10.5)

## 2019-02-24 MED ORDER — SERTRALINE HCL 100 MG PO TABS: 100.0000 mg | ORAL_TABLET | Freq: Every day | ORAL | Status: DC

## 2019-02-24 NOTE — Assessment & Plan Note (Signed)
Doesn't seem adequately controlled at this time, affecting sleep and energy levels.  Increase sertraline to 100mg .

## 2019-02-24 NOTE — Assessment & Plan Note (Addendum)
Exam reassuring, Likely multifactorial.  Has history of FM and depression which may be contributing, will increase sertraline to 157m daily Labs per orders: Orders Placed This Encounter  Procedures  . Comp Met (CMET)  . CBC  . TSH  . Vitamin D (25 hydroxy)  . B12   Return in about 4 weeks (around 03/24/2019) for fatigue.

## 2019-02-24 NOTE — Patient Instructions (Signed)
Increase sertraline to 100mg  We'll give you a call with lab results when they return.

## 2019-02-24 NOTE — Progress Notes (Signed)
Kathleen Griffith - 78 y.o. female MRN [id number]  Date of birth: 1941/09/19  Subjective Chief Complaint  Patient presents with  . Fatigue    ongoing for six weeks, denies fevers.     HPI Kathleen Griffith is a 78 y.o. female with history of  HTN, T2DM, PMR and fibromyalgia, A. Fib, and melanoma here today due to fatigue and feeling "unwell".  She reports that symptoms have been ongoing x6 weeks.  She feel like she has no energy.  She is not sleeping well.  Has had some issues with insomnia since the death of her husband but had been managing ok.  She feels achy at times.  She denies chest pain, shortness of breath, palpitations, dizziness, nausea, vomiting, diarrhea.  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    ROS:  A comprehensive ROS was completed and negative except as noted per HPI  Allergies  Allergen Reactions  . Anoro Ellipta [Umeclidinium-Vilanterol] Palpitations  . Losartan Shortness Of Breath    wheezing wheezing  . Norvasc [Amlodipine Besylate] Swelling  . Pregabalin Swelling    TO LOWER EXTREMTIES TO LOWER EXTREMTIES  . Bupropion     Other reaction(s): Other Sucidal thoughts  . Metoclopramide Other (See Comments)    Tardive dyskinesia, tremors  . Lisinopril Cough  . Metformin Diarrhea  . Metformin And Related Diarrhea    Past Medical History:  Diagnosis Date  . Anemia    microcytic   . Arthritis   . Atrial fibrillation ([place])   . Cataract    REMOVED BILATERAL  . Colon polyps   . Diabetes mellitus ([place])   . Fibromyalgia   . Gastroparesis   . Hyperlipidemia   . Hypertension   . Kidney stones   . Melanoma ([place])   . PMR (polymyalgia rheumatica) (H[facility])   . PVD (peripheral vascular disease) ([place])     Past Surgical History:  Procedure Laterality Date  . APPENDECTOMY    . BREAST BIOPSY    . CESAREAN SECTION    . COLONOSCOPY    . COLONOSCOPY WITH PROPOFOL N/A 12/26/2015   Procedure: COLONOSCOPY WITH PROPOFOL;   Surgeon: Manus Gunning, MD;  Location: [facility];  Service: Gastroenterology;  Laterality: N/A;  . ESOPHAGOGASTRODUODENOSCOPY (EGD) WITH PROPOFOL N/A 12/26/2015   Procedure: ESOPHAGOGASTRODUODENOSCOPY (EGD) WITH PROPOFOL;  Surgeon: Manus Gunning, MD;  Location: [facility];  Service: Gastroenterology;  Laterality: N/A;  . LEFT HEART CATH AND CORONARY ANGIOGRAPHY N/A 06/03/2017   Procedure: Left Heart Cath and Coronary Angiography;  Surgeon: Martinique, Peter M, MD;  Location: [place] [facility];  Service: Cardiovascular;  Laterality: N/A;  . POLYPECTOMY      Social History   Socioeconomic History  . Marital status: Widowed    Spouse name: Not on file  . Number of children: 1  . Years of education: Not on file  . Highest education level: Not on file  Occupational History  . Not on file  Social Needs  . Financial resource strain: Not on file  . Food insecurity:    Worry: Never true    Inability: Never true  . Transportation needs:    Medical: No    Non-medical: No  Tobacco Use  . Smoking status: Current Every Day Smoker    Packs/day: 1.00    Types: Cigarettes  . Smokeless tobacco: Never Used  Substance and Sexual Activity  . Alcohol use: No    Alcohol/week: 0.0 standard  drinks  . Drug use: No  . Sexual activity: Not on file  Lifestyle  . Physical activity:    Days per week: Not on file    Minutes per session: Not on file  . Stress: Not on file  Relationships  . Social connections:    Talks on phone: Not on file    Gets together: Not on file    Attends religious service: Not on file    Active member of club or organization: Not on file    Attends meetings of clubs or organizations: Not on file    Relationship status: Not on file  Other Topics Concern  . Not on file  Social History Narrative  . Not on file    Family History  Problem Relation Age of Onset  . CAD Father        MI and CABG  . Diabetes Father   . Heart disease Father   . Colon  cancer Mother 28    Health Maintenance  Topic Date Due  . FOOT EXAM  01/29/1951  . OPHTHALMOLOGY EXAM  01/29/1951  . TETANUS/TDAP  01/30/1960  . DEXA SCAN  01/29/2006  . PNA vac Low Risk Adult (1 of 2 - PCV13) 01/29/2006  . HEMOGLOBIN A1C  05/19/2019  . INFLUENZA VACCINE  Completed    ----------------------------------------------------------------------------------------------------------------------------------------------------------------------------------------------------------------- Physical Exam BP (!) 148/76   Pulse 65   Temp 98.1 F (36.7 C) (Oral)   Ht 5' 4"  (1.626 m)   Wt 162 lb (73.5 kg)   SpO2 95%   BMI 27.81 kg/m   Physical Exam Constitutional:      Appearance: Normal appearance.  HENT:     Head: Normocephalic and atraumatic.     Mouth/Throat:     Mouth: Mucous membranes are moist.  Eyes:     General: No scleral icterus. Neck:     Musculoskeletal: Neck supple.  Cardiovascular:     Rate and Rhythm: Normal rate and regular rhythm.     Heart sounds: Normal heart sounds.  Pulmonary:     Effort: Pulmonary effort is normal.     Breath sounds: Normal breath sounds.  Skin:    General: Skin is warm and dry.  Neurological:     General: No focal deficit present.     Mental Status: She is alert.  Psychiatric:        Mood and Affect: Mood normal.        Behavior: Behavior normal.     ------------------------------------------------------------------------------------------------------------------------------------------------------------------------------------------------------------------- Assessment and Plan  Other fatigue Exam reassuring, Likely multifactorial.  Has history of FM and depression which may be contributing, will increase sertraline to 143m daily Labs per orders: Orders Placed This Encounter  Procedures  . Comp Met (CMET)  . CBC  . TSH  . Vitamin D (25 hydroxy)  . B12   Return in about 4 weeks (around 03/24/2019) for  fatigue.   Depression Doesn't seem adequately controlled at this time, affecting sleep and energy levels.  Increase sertraline to 1019m

## 2019-02-25 LAB — VITAMIN D 25 HYDROXY (VIT D DEFICIENCY, FRACTURES): VITD: 43.09 ng/mL (ref 30.00–100.00)

## 2019-02-25 LAB — VITAMIN B12: VITAMIN B 12: 552 pg/mL (ref 211–911)

## 2019-02-25 LAB — TSH: TSH: 1.42 u[IU]/mL (ref 0.35–4.50)

## 2019-03-24 ENCOUNTER — Ambulatory Visit: Payer: HMO | Admitting: Family Medicine

## 2019-04-14 ENCOUNTER — Other Ambulatory Visit: Payer: Self-pay | Admitting: Cardiology

## 2019-04-14 MED ORDER — RIVAROXABAN 20 MG PO TABS: 20.0000 mg | ORAL_TABLET | Freq: Every day | ORAL | 1 refills | Status: DC

## 2019-04-14 MED ORDER — HYDRALAZINE HCL 25 MG PO TABS: ORAL_TABLET | ORAL | 0 refills | Status: DC

## 2019-04-14 NOTE — Telephone Encounter (Signed)
78yo, 162 lbs, Scr 1.00 on 02/24/19 Crcl 57ml/min Last OV 11/10/18 Indication Afib

## 2019-04-14 NOTE — Telephone Encounter (Signed)
 *  STAT* If patient is at the pharmacy, call can be transferred to refill team.   1. Which medications need to be refilled? (please list name of each medication and dose if known) hydrALAZINE (APRESOLINE) 25 MG tablet rivaroxaban (XARELTO) 20 MG TABS tablet  2. Which pharmacy/location (including street and city if local pharmacy) is medication to be sent to?  [place]  3. Do they need a 30 day or 90 day supply? 90 days  Patient states that she is supposed to be taking 2 in the AM and 2 in the PM of her Hydralazine

## 2019-04-21 ENCOUNTER — Other Ambulatory Visit: Payer: Self-pay | Admitting: Cardiology

## 2019-04-21 NOTE — Telephone Encounter (Signed)
Hydralazine 25 mg refilled.

## 2019-05-12 ENCOUNTER — Telehealth: Payer: Self-pay | Admitting: Cardiology

## 2019-05-12 NOTE — Telephone Encounter (Signed)
Mychart sent via text, smartphone, consent (verbal), pre reg complete 05/12/19 AF

## 2019-05-13 NOTE — Progress Notes (Signed)
Virtual Visit via Video Note   This visit type was conducted due to national recommendations for restrictions regarding the COVID-19 Pandemic (e.g. social distancing) in an effort to limit this patient's exposure and mitigate transmission in our community.  Due to her co-morbid illnesses, this patient is at least at moderate risk for complications without adequate follow up.  This format is felt to be most appropriate for this patient at this time.  All issues noted in this document were discussed and addressed.  A limited physical exam was performed with this format.  Please refer to the patient's chart for her consent to telehealth for [facility].   Date:  05/14/2019   ID:  Kathleen Griffith, DOB 1941/06/05, MRN [id number]  Patient Location: Home Provider Location: Home  PCP:  Luetta Nutting, DO  Cardiologist:  Kirk Ruths, MD   Evaluation Performed:  Follow-Up Visit  Chief Complaint:  FU Atrial fibrillation  History of Present Illness:    FU atrial fibrillation. She has a history of paroxysmal atrial fibrillation. Abdominal ultrasound May 2016 showed no aneurysm. Patient admitted with non-ST elevation myocardial infarction in June 2018. Cardiac catheterization revealed a 20% proximal to mid LAD and normal left ventricular end-diastolic pressure. Echocardiogram showed ejection fraction 00-92%, grade 2 diastolic dysfunction, mild tricuspid regurgitation, severely elevated pulmonary pressure. Sludge noted LV apex. 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  Was noted to have intermittent atrial fibrillation; felt to be secondary to stress of sepsis. Carotid Dopplers November 2019 showed 1 to 39% right and 40 to 59% left.  Since last seen,has some dyspnea on exertion but no orthopnea, PND or pedal edema.  No chest pain or palpitations.   No bleeding.  The patient does not have symptoms concerning for COVID-19 infection (fever, chills, cough, or new shortness of breath).    Past Medical History:  Diagnosis Date   Anemia    microcytic    Arthritis    Atrial fibrillation (HCC)    Cataract    REMOVED BILATERAL   Colon polyps    Diabetes mellitus ([place])    Fibromyalgia    Gastroparesis    Hyperlipidemia    Hypertension    Kidney stones    Melanoma ([place])    PMR (polymyalgia rheumatica) (HCC)    PVD (peripheral vascular disease) ([place])    Past Surgical History:  Procedure Laterality Date   APPENDECTOMY     BREAST BIOPSY     CESAREAN SECTION     COLONOSCOPY     COLONOSCOPY WITH PROPOFOL N/A 12/26/2015   Procedure: COLONOSCOPY WITH PROPOFOL;  Surgeon: Manus Gunning, MD;  [facility];  Service: Gastroenterology;  Laterality: N/A;   ESOPHAGOGASTRODUODENOSCOPY (EGD) WITH PROPOFOL N/A 12/26/2015   Procedure: ESOPHAGOGASTRODUODENOSCOPY (EGD) WITH PROPOFOL;  Surgeon: Manus Gunning, MD;  [facility];  Service: Gastroenterology;  Laterality: N/A;   LEFT HEART CATH AND CORONARY ANGIOGRAPHY N/A 06/03/2017   Procedure: Left Heart Cath and Coronary Angiography;  Surgeon: Martinique, Peter M, MD;  Location: [place] [facility];  Service: Cardiovascular;  Laterality: N/A;   POLYPECTOMY       Current Meds  Medication Sig   acetaminophen (TYLENOL) 650 MG CR tablet Take 1,300 mg by mouth every 8 (eight) hours as needed for pain.   albuterol (PROVENTIL HFA;VENTOLIN HFA) 108 (90 Base) MCG/ACT inhaler Inhale 1 puff into the lungs every 6 (six) hours  as needed for wheezing or shortness of breath.   alendronate (FOSAMAX) 70 MG tablet Take 70 mg by mouth once a week.    Insulin Glargine (LANTUS SOLOSTAR) 100 UNIT/ML Solostar Pen Inject 35 Units into the skin daily.   metoprolol tartrate (LOPRESSOR) 25 MG tablet    nitroGLYCERIN (NITROSTAT) 0.3 MG SL tablet Place 1 tablet (0.3  mg total) under the tongue every 5 (five) minutes as needed for chest pain.   pravastatin (PRAVACHOL) 10 MG tablet Take 10 mg by mouth every evening.    rivaroxaban (XARELTO) 20 MG TABS tablet Take 1 tablet (20 mg total) by mouth daily with supper.   sertraline (ZOLOFT) 100 MG tablet Take 1 tablet (100 mg total) by mouth daily.     Allergies:   Anoro ellipta [umeclidinium-vilanterol]; Losartan; Norvasc [amlodipine besylate]; Pregabalin; Bupropion; Metoclopramide; Lisinopril; Metformin; and Metformin and related   Social History   Tobacco Use   Smoking status: Current Every Day Smoker    Packs/day: 1.00    Types: Cigarettes   Smokeless tobacco: Never Used  Substance Use Topics   Alcohol use: No    Alcohol/week: 0.0 standard drinks   Drug use: No     Family Hx: The patient's family history includes CAD in her father; Colon cancer (age of onset: 34) in her mother; Diabetes in her father; Heart disease in her father.  ROS:   Please see the history of present illness.    No fevers, chills or productive cough.  Generalized fatigue and back pain. All other systems reviewed and are negative.  Recent Labs: 09/25/2018: B Natriuretic Peptide 393.7; Magnesium 2.0 02/24/2019: ALT 17; BUN 31; Creatinine, Ser 1.00; Hemoglobin 14.1; Platelets 288.0; Potassium 4.1; Sodium 139; TSH 1.42    Wt Readings from Last 3 Encounters:  05/14/19 153 lb (69.4 kg)  02/24/19 162 lb (73.5 kg)  12/10/18 161 lb (73 kg)     Objective:    Vital Signs:  BP (!) 111/52    Pulse (!) 59    Wt 153 lb (69.4 kg)    BMI 26.26 kg/m    VITAL SIGNS:  reviewed  No acute distress Answers questions appropriately Normal affect Remainder of physical examination not performed (telehealth visit; coronavirus pandemic)  ASSESSMENT & PLAN:    1. Paroxysmal atrial fibrillation-patient remains in sinus rhythm by history.  Continue metoprolol for rate control if atrial fibrillation recurs.  Continue  Xarelto. 2. Hypertension-patient's blood pressure is controlled.  She states it occasionally runs low.  We have discontinued hydralazine.  Continue Toprol. 3. Hyperlipidemia-continue statin.  Note she did not tolerate high-dose statins previously. 4. History of takotsubo cardiomyopathy-improved on most recent echocardiogram. 5. Tobacco abuse-patient counseled on discontinuing. 6. Carotid artery disease-she will need follow-up carotid Dopplers November 2020. 7. Peripheral vascular disease-patient is not on aspirin given need for Xarelto.  Continue statin.  COVID-19 Education: The importance of social distancing was discussed today.  Time:   Today, I have spent 12 minutes with the patient with telehealth technology discussing the above problems.     Medication Adjustments/Labs and Tests Ordered: Current medicines are reviewed at length with the patient today.  Concerns regarding medicines are outlined above.   Tests Ordered: No orders of the defined types were placed in this encounter.   Medication Changes: No orders of the defined types were placed in this encounter.   Disposition:  Follow up in 6 month(s)  Signed, Kirk Ruths, MD  05/14/2019 1:46 PM    [facility] °

## 2019-05-14 ENCOUNTER — Telehealth (INDEPENDENT_AMBULATORY_CARE_PROVIDER_SITE_OTHER): Payer: HMO | Admitting: Cardiology

## 2019-05-14 ENCOUNTER — Encounter: Payer: Self-pay | Admitting: Cardiology

## 2019-05-14 VITALS — BP 111/52 | HR 59 | Wt 153.0 lb

## 2019-05-14 DIAGNOSIS — I480 Paroxysmal atrial fibrillation: Secondary | ICD-10-CM

## 2019-05-14 DIAGNOSIS — I10 Essential (primary) hypertension: Secondary | ICD-10-CM

## 2019-05-14 DIAGNOSIS — E7800 Pure hypercholesterolemia, unspecified: Secondary | ICD-10-CM

## 2019-05-14 DIAGNOSIS — I679 Cerebrovascular disease, unspecified: Secondary | ICD-10-CM

## 2019-05-14 NOTE — Patient Instructions (Signed)

## 2019-05-18 ENCOUNTER — Other Ambulatory Visit: Payer: Self-pay

## 2019-05-18 NOTE — Patient Outreach (Signed)
[facility]) Care Management Chronic Special Needs Program  05/18/2019  Name: Lanitra Battaglini DOB: 28-Aug-1941  MRN: [id number]  Ms. Chamia Schmutz is enrolled in a chronic special needs plan for Diabetes. Chronic Care Management Coordinator telephoned client to review health risk assessment and to develop individualized care plan.  Introduced the chronic care management program, importance of client participation, and taking their care plan to all provider appointments and inpatient facilities.  Reviewed the transition of care process and possible referral to community care management.  Subjective: Client states that she is staying isolated and she wears a mask when she goes out.  States her blood sugar readings range from 80-120 in the morning.  States she is not able to exercise much due to her back pain but she does try to walk to the mailbox several times a week.  States she is still smoking and does not want to stop at this time.  Denies any chest pains and states her B/P is good and her cardiologist stopped her hydralazine.  States she takes her Xarelto for her Atrial fib.  Goals Addressed            This Visit's Progress   . Client understands the importance of follow-up with providers by attending scheduled visits   On track   . Client verbalizes knowledge of Heart Attack self management skills by 9 months   On track   . Client will report no worsening of symptoms of Atrial Fibrillation within the next 9 months   On track   . Client will use Assistive Devices as needed and verbalize understanding of device use   On track   . Client will verbalize knowledge of self management of Hypertension as evidences by BP reading of 140/90 or less; or as defined by provider   On track   . Decrease inpatient diabetes admissions/readmissions with in the year   On track   . HEMOGLOBIN A1C < 7.0       Continue Diabetes self management actions:  Glucose monitoring per provider  recommendations  Perform Quality checks on blood meter  Eat Healthy  Check feet daily  Visit provider every 3-6 months as directed  Hbg A1C level every 3-6 months.  Eye Exam yearly    . Maintain timely refills of diabetic medication as prescribed within the year .   On track   . Obtain annual  Lipid Profile, LDL-C   On track   . Obtain Annual Eye (retinal)  Exam    On track   . Obtain Annual Foot Exam   On track   . Obtain annual screen for micro albuminuria (urine) , nephropathy (kidney problems)   On track   . Obtain Hemoglobin A1C at least 2 times per year   On track   . Quit Smoking   Not on track    Please review Musician    . Visit Primary Care Provider or Endocrinologist at least 2 times per year    On track    Client is  meeting diabetes self management goal of hemoglobin A1C of <7% with last reading of 5.6%.  Client due for annual eye and foot exam.  Client had eye exam cancelled due to Covid 19 Discussed COVID19 cause, symptoms, precautions (social distancing, stay at home order, hand washing), confirmed client knows how to contact provider. Plan:  Send successful outreach letter with a copy of their individualized care plan, Send individual care plan to provider and Send  Automotive engineer flyer  Chronic care management coordination will outreach in:  6-8 Months    Peter Garter RN, [employer], [place] Programmer, multimedia Care Management [phone]

## 2019-05-19 ENCOUNTER — Other Ambulatory Visit: Payer: Self-pay | Admitting: Family Medicine

## 2019-05-19 DIAGNOSIS — IMO0001 Reserved for inherently not codable concepts without codable children: Secondary | ICD-10-CM

## 2019-05-20 ENCOUNTER — Telehealth: Payer: Self-pay | Admitting: Family Medicine

## 2019-05-20 DIAGNOSIS — IMO0001 Reserved for inherently not codable concepts without codable children: Secondary | ICD-10-CM

## 2019-05-20 MED ORDER — INSULIN GLARGINE 100 UNIT/ML SOLOSTAR PEN: PEN_INJECTOR | SUBCUTANEOUS | 2 refills | Status: DC

## 2019-05-20 NOTE — Telephone Encounter (Signed)
Pt called needing a refill on Lantus Solostar 100 unit/ml Solostar pen. Pharmacy is [facility] on [address] in [place].

## 2019-05-20 NOTE — Telephone Encounter (Signed)
Completed.

## 2019-06-11 ENCOUNTER — Encounter: Payer: HMO | Admitting: Family Medicine

## 2019-06-11 ENCOUNTER — Encounter: Payer: Self-pay | Admitting: Family Medicine

## 2019-06-11 NOTE — Progress Notes (Signed)
This encounter was created in error - please disregard.

## 2019-06-14 DIAGNOSIS — M2550 Pain in unspecified joint: Secondary | ICD-10-CM | POA: Diagnosis not present

## 2019-06-14 DIAGNOSIS — M353 Polymyalgia rheumatica: Secondary | ICD-10-CM | POA: Diagnosis not present

## 2019-06-14 DIAGNOSIS — M797 Fibromyalgia: Secondary | ICD-10-CM | POA: Diagnosis not present

## 2019-06-14 DIAGNOSIS — M48061 Spinal stenosis, lumbar region without neurogenic claudication: Secondary | ICD-10-CM | POA: Diagnosis not present

## 2019-06-14 DIAGNOSIS — R768 Other specified abnormal immunological findings in serum: Secondary | ICD-10-CM | POA: Diagnosis not present

## 2019-06-14 DIAGNOSIS — M810 Age-related osteoporosis without current pathological fracture: Secondary | ICD-10-CM | POA: Diagnosis not present

## 2019-06-14 DIAGNOSIS — M150 Primary generalized (osteo)arthritis: Secondary | ICD-10-CM | POA: Diagnosis not present

## 2019-06-15 ENCOUNTER — Telehealth: Payer: Self-pay

## 2019-06-15 NOTE — Telephone Encounter (Signed)
Questions for Screening COVID-19  Symptom onset: none  Travel or Contacts: none  During this illness, did/does the patient experience any of the following symptoms? Fever >100.62F []   Yes [x]   No []   Unknown Subjective fever (felt feverish) []   Yes [x]   No []   Unknown Chills []   Yes []   No []   Unknown Muscle aches (myalgia) []   Yes [x]   No []   Unknown Runny nose (rhinorrhea) []   Yes [x]   No []   Unknown Sore throat []   Yes [x]   No []   Unknown Cough (new onset or worsening of chronic cough) []   Yes [x]   No []   Unknown Shortness of breath (dyspnea) []   Yes []   No []   Unknown Nausea or vomiting []   Yes [x]   No []   Unknown Headache []   Yes [x]   No []   Unknown Abdominal pain  []   Yes [x]   No []   Unknown Diarrhea (?3 loose/looser than normal stools/24hr period) []   Yes [x]   No []   Unknown Other, specify:  Patient risk factors: Smoker? []   Current [x]   Former []   Never If female, currently pregnant? []   Yes []   No  Patient Active Problem List   Diagnosis Date Noted  . Other fatigue 02/24/2019  . Melanoma ([place]) 12/10/2018  . Atrial fibrillation with RVR ([place]) 09/16/2018  . Depression 12/03/2017  . Normal coronary arteries 12/03/2017  . 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 Polymyalgia rheumatica ([place]) 12/03/2017  . NSTEMI (non-ST elevated myocardial infarction) ([place]) 06/02/2017  . PAF (paroxysmal atrial fibrillation) ([place]) 03/20/2016  . Insulin dependent diabetes mellitus ([place]) 03/20/2016  . GERD (gastroesophageal reflux disease) 03/20/2016  . Gastric polyp   . Anemia, iron deficiency   . Angiodysplasia of cecum   . Colon polyp   . 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 Chronic anticoagulation 07/28/2015  . Family history of colon cancer 07/28/2015  . Carotid artery disease ([place]) 04/04/2015  . Tobacco abuse 04/04/2015  . Essential hypertension 04/04/2015  . Hyperlipidemia 04/04/2015    Plan:  []   High risk for COVID-19 with red flags go to ED (with CP, SOB, weak/lightheaded, or  fever > 101.5). Call ahead.  []   High risk for COVID-19 but stable. Inform provider and coordinate time for [facility] visit.   [x]   No red flags but URI signs or symptoms okay for [facility] visit.

## 2019-06-16 ENCOUNTER — Ambulatory Visit (INDEPENDENT_AMBULATORY_CARE_PROVIDER_SITE_OTHER): Payer: HMO | Admitting: Family Medicine

## 2019-06-16 ENCOUNTER — Encounter: Payer: Self-pay | Admitting: Family Medicine

## 2019-06-16 VITALS — BP 102/64 | HR 56 | Temp 98.3°F | Resp 20 | Ht 64.0 in | Wt 158.0 lb

## 2019-06-16 DIAGNOSIS — I10 Essential (primary) hypertension: Secondary | ICD-10-CM

## 2019-06-16 DIAGNOSIS — L299 Pruritus, unspecified: Secondary | ICD-10-CM | POA: Diagnosis not present

## 2019-06-16 DIAGNOSIS — E119 Type 2 diabetes mellitus without complications: Secondary | ICD-10-CM | POA: Diagnosis not present

## 2019-06-16 DIAGNOSIS — F329 Major depressive disorder, single episode, unspecified: Secondary | ICD-10-CM

## 2019-06-16 DIAGNOSIS — Z794 Long term (current) use of insulin: Secondary | ICD-10-CM | POA: Diagnosis not present

## 2019-06-16 DIAGNOSIS — M353 Polymyalgia rheumatica: Secondary | ICD-10-CM | POA: Diagnosis not present

## 2019-06-16 DIAGNOSIS — IMO0001 Reserved for inherently not codable concepts without codable children: Secondary | ICD-10-CM

## 2019-06-16 MED ORDER — TRIAMCINOLONE ACETONIDE 0.1 % EX CREA: 1.0000 "application " | TOPICAL_CREAM | Freq: Two times a day (BID) | CUTANEOUS | 0 refills | Status: DC

## 2019-06-16 MED ORDER — LIDOCAINE 5 % EX PTCH: 1.0000 | MEDICATED_PATCH | CUTANEOUS | 2 refills | Status: DC

## 2019-06-16 NOTE — Progress Notes (Signed)
Kathleen Griffith - 78 y.o. female MRN [id number]  Date of birth: 1941/09/24  Subjective Chief Complaint  Patient presents with  . Follow-up    6 mo F/U HTN/DM , BP ranges 100's-120's/40's-60's, FGBS ranges 20's-118    HPI Kathleen Griffith is a 78 y.o. female with history of HTN, DM, PMR, A. Fib and HLD here today for 6 month follow up.   -HTN:  Current management with metoprolol.  Tolerating well and denies side effects including symptoms of hypotension.  She continues to follow with cardiology as well for management of A. Fib. She denies chest pain, shortness of breath, headache or vision changes.   -DM: Current management with Lantus 35 units daily.  Doing well with current insulin dosing.  She did have an episode of hypoglycemia but attributes this to not eating after giving insulin.    -Chronic pain:  History of PMR, has been weaned from steroids.  Still follows with rheumatology. Weaned from steroids a few months ago.  She was recently started back on gabapentin as well.  Tolerating well so far.  Pain located mainly in lower back but also shoulder and hips.  She denies weakness, numbness or tingling.  She had taken cymbalta before but was unsure why this was discontinued.  She has never tried anything topical such as lidocaine patches for her back pain.   -Itching:  Reports itching of skin along R rib area, top of r ear and nose.  Has tried several OTC topicals without much improvement.  Has f/u with dermatology but not for a few months.  Denies rash.   ROS:  A comprehensive ROS was completed and negative except as noted per HPI   Allergies  Allergen Reactions  . Anoro Ellipta [Umeclidinium-Vilanterol] Palpitations  . Losartan Shortness Of Breath    wheezing wheezing  . Norvasc [Amlodipine Besylate] Swelling  . Pregabalin Swelling    TO LOWER EXTREMTIES TO LOWER EXTREMTIES  . Bupropion     Other reaction(s): Other Sucidal thoughts  . Metoclopramide Other (See Comments)   Tardive dyskinesia, tremors  . Lisinopril Cough  . Metformin Diarrhea  . Metformin And Related Diarrhea    Past Medical History:  Diagnosis Date  . Anemia    microcytic   . Arthritis   . Atrial fibrillation ([place])   . Cataract    REMOVED BILATERAL  . Colon polyps   . Diabetes mellitus ([place])   . Fibromyalgia   . Gastroparesis   . Hyperlipidemia   . Hypertension   . Kidney stones   . Melanoma ([place])   . PMR (polymyalgia rheumatica) ([facility])   . PVD (peripheral vascular disease) ([place])     Past Surgical History:  Procedure Laterality Date  . APPENDECTOMY    . BREAST BIOPSY    . CESAREAN SECTION    . COLONOSCOPY    . COLONOSCOPY WITH PROPOFOL N/A 12/26/2015   Procedure: COLONOSCOPY WITH PROPOFOL;  Surgeon: Manus Gunning, MD;  Location: [facility];  Service: Gastroenterology;  Laterality: N/A;  . ESOPHAGOGASTRODUODENOSCOPY (EGD) WITH PROPOFOL N/A 12/26/2015   Procedure: ESOPHAGOGASTRODUODENOSCOPY (EGD) WITH PROPOFOL;  Surgeon: Manus Gunning, MD;  Location: [facility];  Service: Gastroenterology;  Laterality: N/A;  . LEFT HEART CATH AND CORONARY ANGIOGRAPHY N/A 06/03/2017   Procedure: Left Heart Cath and Coronary Angiography;  Surgeon: Martinique, Peter M, MD;  Location: [facility];  Service: Cardiovascular;  Laterality: N/A;  . POLYPECTOMY      Social History   Socioeconomic History  .  Marital status: Widowed    Spouse name: Not on file  . Number of children: 1  . Years of education: Not on file  . Highest education level: Not on file  Occupational History  . Not on file  Social Needs  . Financial resource strain: Not on file  . Food insecurity    Worry: Never true    Inability: Never true  . Transportation needs    Medical: No    Non-medical: No  Tobacco Use  . Smoking status: Current Every Day Smoker    Packs/day: 1.00    Types: Cigarettes  . Smokeless tobacco: Never Used  Substance and Sexual Activity  . Alcohol use: No     Alcohol/week: 0.0 standard drinks  . Drug use: No  . Sexual activity: Not on file  Lifestyle  . Physical activity    Days per week: Not on file    Minutes per session: Not on file  . Stress: Not on file  Relationships  . Social Herbalist on phone: Not on file    Gets together: Not on file    Attends religious service: Not on file    Active member of club or organization: Not on file    Attends meetings of clubs or organizations: Not on file    Relationship status: Not on file  Other Topics Concern  . Not on file  Social History Narrative  . Not on file    Family History  Problem Relation Age of Onset  . CAD Father        MI and CABG  . Diabetes Father   . Heart disease Father   . Colon cancer Mother 52    Health Maintenance  Topic Date Due  . FOOT EXAM  01/29/1951  . OPHTHALMOLOGY EXAM  01/29/1951  . TETANUS/TDAP  01/30/1960  . DEXA SCAN  01/29/2006  . HEMOGLOBIN A1C  05/19/2019  . INFLUENZA VACCINE  07/31/2019  . PNA vac Low Risk Adult  Completed    ----------------------------------------------------------------------------------------------------------------------------------------------------------------------------------------------------------------- Physical Exam BP 102/64   Pulse (!) 56   Temp 98.3 F (36.8 C) (Oral)   Resp 20   Ht 5\' 4"  (1.626 m)   Wt 158 lb (71.7 kg)   SpO2 96%   BMI 27.12 kg/m   Physical Exam Constitutional:      General: She is not in acute distress. HENT:     Head: Normocephalic and atraumatic.     Mouth/Throat:     Mouth: Mucous membranes are moist.  Eyes:     General: No scleral icterus. Neck:     Musculoskeletal: Neck supple.  Cardiovascular:     Rate and Rhythm: Normal rate and regular rhythm.  Pulmonary:     Effort: Pulmonary effort is normal.     Breath sounds: Normal breath sounds.  Musculoskeletal:     Right lower leg: No edema.     Left lower leg: No edema.  Skin:    General: Skin is warm and  dry.     Findings: No rash.  Neurological:     General: No focal deficit present.     Mental Status: She is alert.  Psychiatric:        Mood and Affect: Mood normal.        Behavior: Behavior normal.     ------------------------------------------------------------------------------------------------------------------------------------------------------------------------------------------------------------------- Assessment and Plan  Essential hypertension BP is well controlled at this time. She will continue current medication of metoprolol.    Insulin dependent diabetes  mellitus ([place]) -Sugars have been stable, solitary episode of hypoglycemia and understands why this happened.  Discussed importance of eating when using insulin.  -Update a1c today.   Polymyalgia rheumatica ([place]) -Still with pain, difficulty being maintained on steroids due to diabetes.  -She also seems to have some neuropathic pain that may be contributing to the overall picture.  Gabapentin recently added back on.  -Will add lidoderm patches on for low back pain as well.   Depression -Stable with sertraline, continue.   Pruritus -Trial of triamcinolone to affected areas.  Discussed avoiding use on face.

## 2019-06-17 ENCOUNTER — Encounter: Payer: Self-pay | Admitting: Family Medicine

## 2019-06-17 DIAGNOSIS — L299 Pruritus, unspecified: Secondary | ICD-10-CM | POA: Insufficient documentation

## 2019-06-17 LAB — LIPID PANEL
Cholesterol: 171 mg/dL (ref <200)
HDL: 37 mg/dL — ABNORMAL LOW (ref >=50)
LDL Cholesterol (Calc): 105 mg/dL (calc) — ABNORMAL HIGH
Non-HDL Cholesterol (Calc): 134 mg/dL (calc) — ABNORMAL HIGH (ref <130)
Total CHOL/HDL Ratio: 4.6 (calc) (ref <5.0)
Triglycerides: 176 mg/dL — ABNORMAL HIGH (ref <150)

## 2019-06-17 LAB — HEMOGLOBIN A1C
Hgb A1c MFr Bld: 5.8 % of total Hgb — ABNORMAL HIGH (ref <5.7)
Mean Plasma Glucose: 120 (calc)
eAG (mmol/L): 6.6 (calc)

## 2019-06-17 NOTE — Assessment & Plan Note (Signed)
-  Sugars have been stable, solitary episode of hypoglycemia and understands why this happened.  Discussed importance of eating when using insulin.  -Update a1c today.

## 2019-06-17 NOTE — Assessment & Plan Note (Signed)
-  Trial of triamcinolone to affected areas.  Discussed avoiding use on face.

## 2019-06-17 NOTE — Assessment & Plan Note (Signed)
-  Stable with sertraline, continue.

## 2019-06-17 NOTE — Assessment & Plan Note (Signed)
BP is well controlled at this time. She will continue current medication of metoprolol.

## 2019-06-17 NOTE — Assessment & Plan Note (Signed)
-  Still with pain, difficulty being maintained on steroids due to diabetes.  -She also seems to have some neuropathic pain that may be contributing to the overall picture.  Gabapentin recently added back on.  -Will add lidoderm patches on for low back pain as well.

## 2019-06-21 ENCOUNTER — Telehealth: Payer: Self-pay | Admitting: Family Medicine

## 2019-06-21 ENCOUNTER — Other Ambulatory Visit: Payer: Self-pay | Admitting: Family Medicine

## 2019-06-21 MED ORDER — PRAVASTATIN SODIUM 20 MG PO TABS: 20.0000 mg | ORAL_TABLET | Freq: Every day | ORAL | 2 refills | Status: DC

## 2019-06-21 NOTE — Progress Notes (Signed)
A1c is stable at 5.8% I would like to increase her pravastatin to 20mg  and get her LDL at least <100.

## 2019-06-21 NOTE — Telephone Encounter (Signed)
Patient calling to check status of lab results from 6/17.

## 2019-06-21 NOTE — Telephone Encounter (Signed)
Medication:  lidocaine (LIDODERM) 5 %   Patient states that per pharmacy, this medication needs prior authorization.

## 2019-06-22 ENCOUNTER — Telehealth: Payer: Self-pay | Admitting: Family Medicine

## 2019-06-22 ENCOUNTER — Other Ambulatory Visit: Payer: Self-pay

## 2019-06-22 DIAGNOSIS — F329 Major depressive disorder, single episode, unspecified: Secondary | ICD-10-CM

## 2019-06-22 MED ORDER — SERTRALINE HCL 100 MG PO TABS: 100.0000 mg | ORAL_TABLET | Freq: Every day | ORAL | 1 refills | Status: DC

## 2019-06-22 NOTE — Telephone Encounter (Signed)
Completed.

## 2019-06-22 NOTE — Telephone Encounter (Signed)
Medication: sertraline (ZOLOFT) 100 MG tablet  Has the patient contacted their pharmacy? no  Preferred Pharmacy (with phone number or street name):  [id number] - [place], [address] AT [address] [phone] (Phone) [phone] (Fax)   Agent: Please be advised that RX refills may take up to 3 business days. We ask that you follow-up with your pharmacy.

## 2019-06-22 NOTE — Telephone Encounter (Signed)
Called Pt yesterday . She is aware. Closing task.

## 2019-07-06 NOTE — Telephone Encounter (Signed)
Copied from [place] [id number]. Topic: General - Inquiry >> Jul 06, 2019 12:22 PM Rayann Heman wrote: Reason for CRM: pt called and stated that she would like a call back from the nurse regarding most recent lab results. Please advise Returned call back to Pt LVM.

## 2019-07-07 NOTE — Telephone Encounter (Signed)
Per result note:   I would like to increase her pravastatin to 20mg  and get her LDL at least <100.

## 2019-07-07 NOTE — Telephone Encounter (Signed)
Returned call back to Pt. She wanted to know her Lipid panel results again. Pt questioned why her Pravastatin was increased , if her numbers were just a little high, why the increase. Did inform Pt of F/U labs in 6 months to recheck . Please advise. Thanks

## 2019-07-07 NOTE — Telephone Encounter (Signed)
Patient returning call to Almyra Free to go over most recent lab results. Spoke to office and was advised that she was currently at lunch. Please advise.

## 2019-07-13 ENCOUNTER — Telehealth: Payer: Self-pay | Admitting: Cardiology

## 2019-07-13 NOTE — Telephone Encounter (Signed)
New message   Patient would like a call. Patient states that she is having a problem with medications.

## 2019-07-16 NOTE — Telephone Encounter (Signed)
Left message for pt to call.

## 2019-07-16 NOTE — Telephone Encounter (Signed)
Spoke with pt, her medical doctor recently increased her pravastatin to 20 mg once daily. Cholesterol numbers and reasoning for the change discussed. Other questions regarding medications answered.

## 2019-08-01 IMAGING — CR DG CHEST 2V
2 series · 2 of 2 positions shown · non-contrast
Comparison: September 14, 2018

CLINICAL DATA: Weakness.  UTI and sepsis.

EXAM:
CHEST - 2 VIEW

[w chest lat]
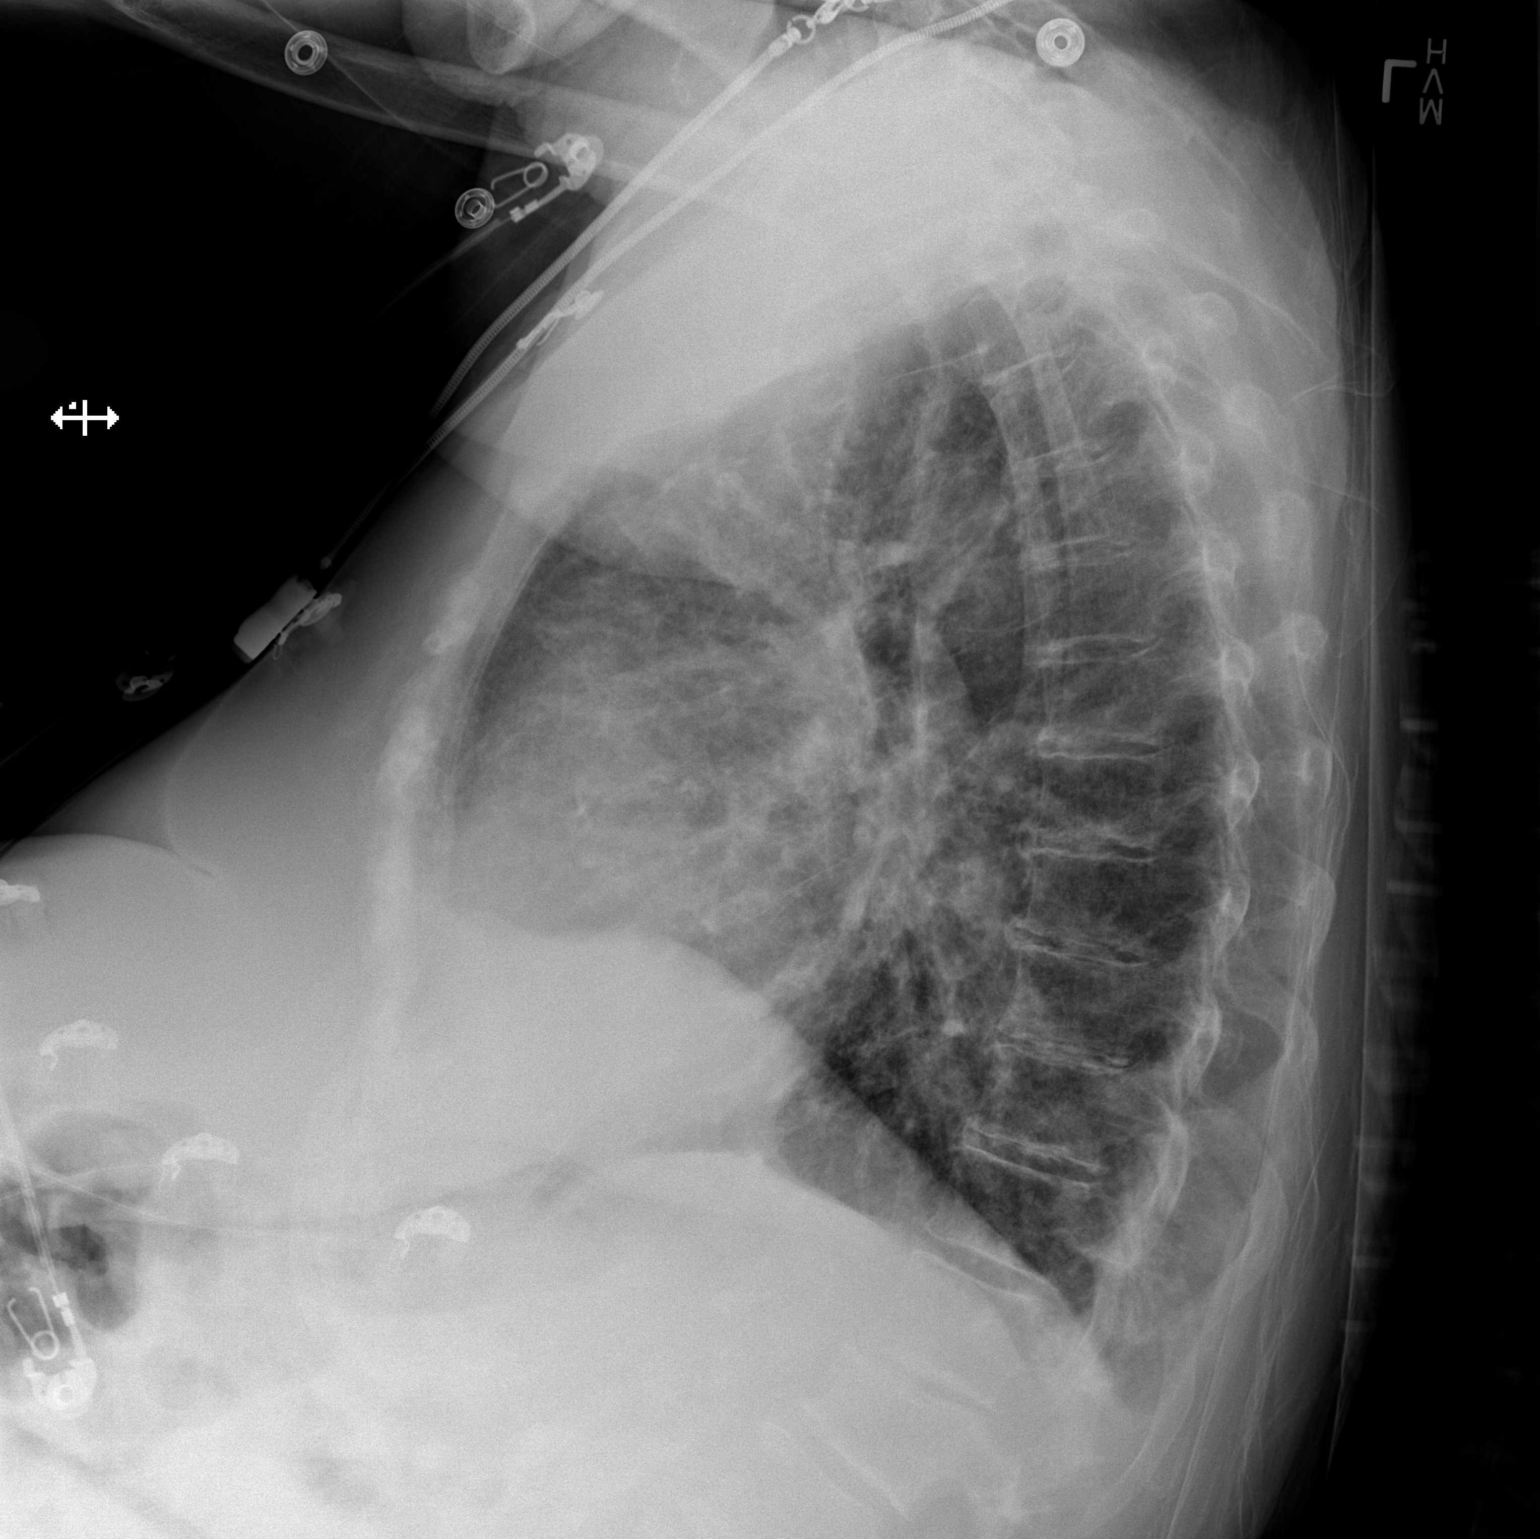

[x chest ap]
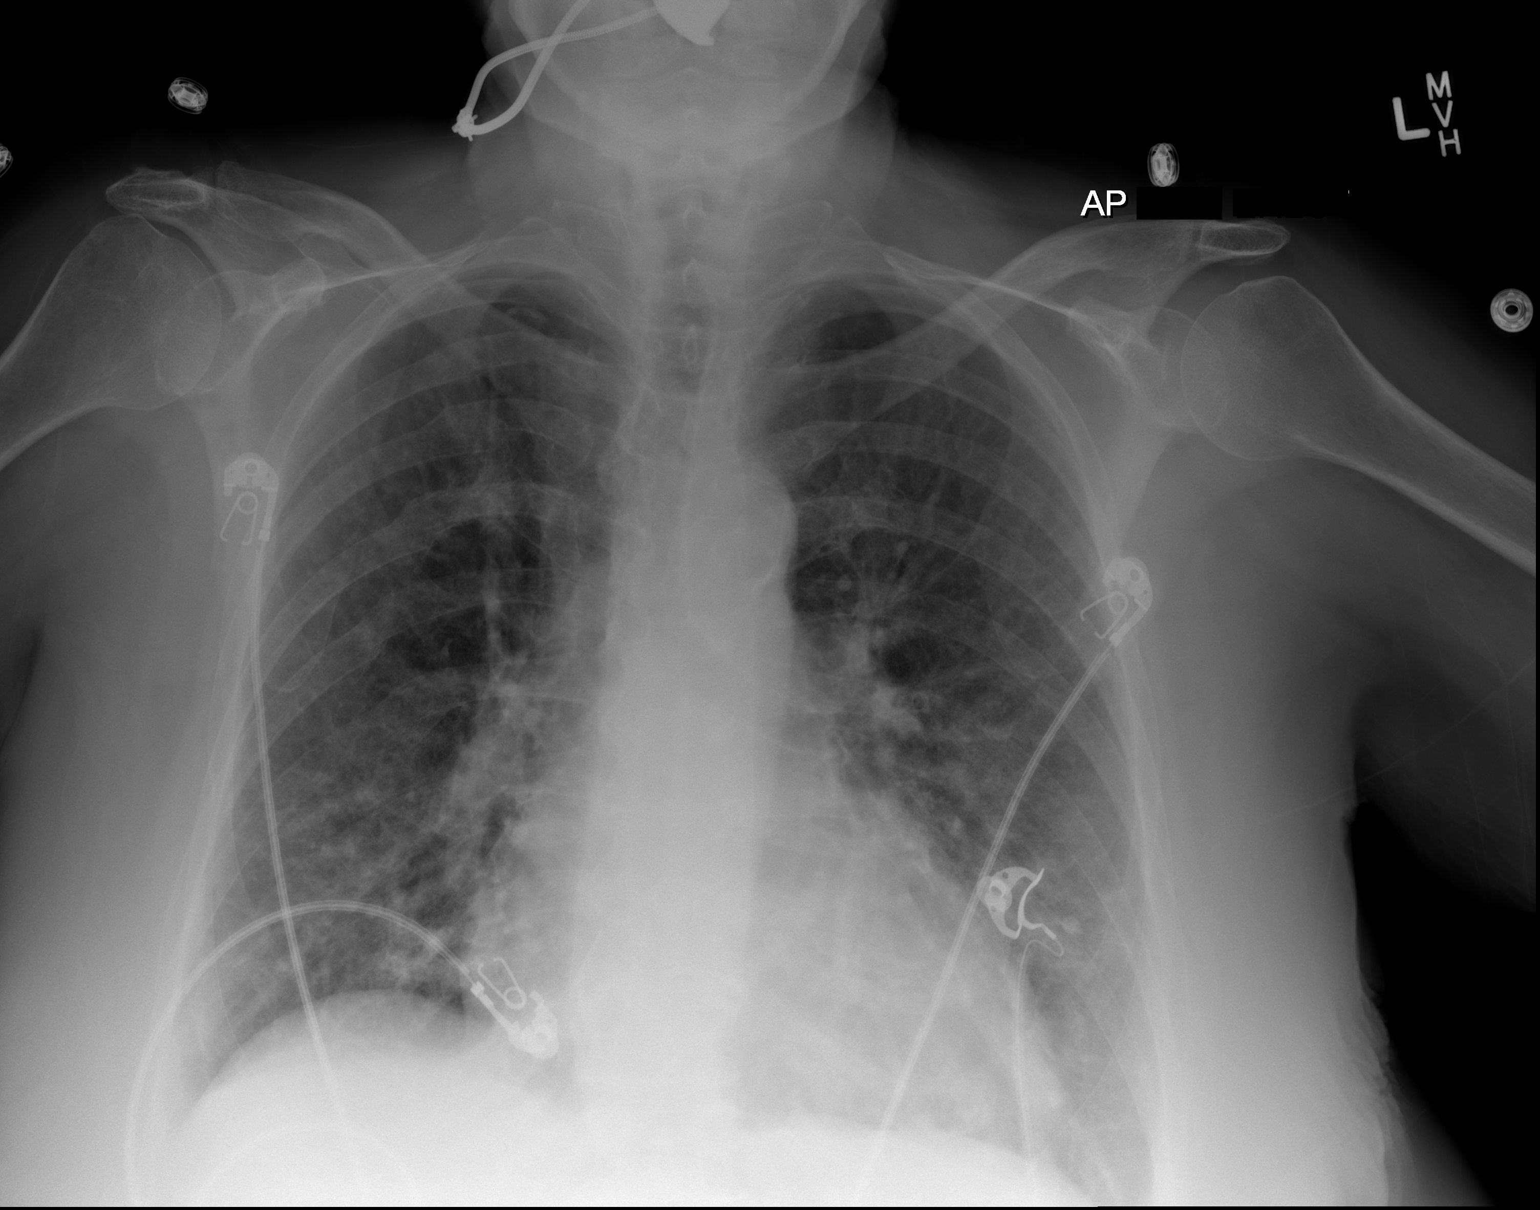

[2 of 2 positions shown; findings below may reference images not displayed]

FINDINGS: Mild cardiomegaly. Mild diffuse interstitial opacities. No focal
infiltrate. No other acute abnormalities. Healed right rib
fractures.
IMPRESSION: 1. Findings are most consistent with cardiomegaly and mild edema.
2. Nonacute compression fracture of an upper lumbar vertebral body.

## 2019-08-12 ENCOUNTER — Other Ambulatory Visit: Payer: Self-pay | Admitting: Family Medicine

## 2019-08-12 MED ORDER — ONETOUCH ULTRA VI STRP: ORAL_STRIP | 12 refills | Status: AC

## 2019-08-12 NOTE — Telephone Encounter (Signed)
Medication Refill - Medication: one touch ultra  Has the patient contacted their pharmacy? Yes.   (Agent: If no, request that the patient contact the pharmacy for the refill.) (Agent: If yes, when and what did the pharmacy advise?)  Pt checks blood sugar 2 times day  Preferred Pharmacy (with phone number or street name): [facility] [address]  Pt is out  Has already called pharm    Agent: Please be advised that RX refills may take up to 3 business days. We ask that you follow-up with your pharmacy.

## 2019-08-12 NOTE — Telephone Encounter (Signed)
Rx sent in as requested. 

## 2019-09-05 ENCOUNTER — Telehealth: Payer: Self-pay | Admitting: Cardiology

## 2019-09-08 DIAGNOSIS — D2261 Melanocytic nevi of right upper limb, including shoulder: Secondary | ICD-10-CM | POA: Diagnosis not present

## 2019-09-08 DIAGNOSIS — D485 Neoplasm of uncertain behavior of skin: Secondary | ICD-10-CM | POA: Diagnosis not present

## 2019-09-08 DIAGNOSIS — D225 Melanocytic nevi of trunk: Secondary | ICD-10-CM | POA: Diagnosis not present

## 2019-09-08 DIAGNOSIS — C4441 Basal cell carcinoma of skin of scalp and neck: Secondary | ICD-10-CM | POA: Diagnosis not present

## 2019-09-08 DIAGNOSIS — D2262 Melanocytic nevi of left upper limb, including shoulder: Secondary | ICD-10-CM | POA: Diagnosis not present

## 2019-09-08 DIAGNOSIS — D224 Melanocytic nevi of scalp and neck: Secondary | ICD-10-CM | POA: Diagnosis not present

## 2019-09-08 DIAGNOSIS — D044 Carcinoma in situ of skin of scalp and neck: Secondary | ICD-10-CM | POA: Diagnosis not present

## 2019-09-08 DIAGNOSIS — L820 Inflamed seborrheic keratosis: Secondary | ICD-10-CM | POA: Diagnosis not present

## 2019-09-08 DIAGNOSIS — Z85820 Personal history of malignant melanoma of skin: Secondary | ICD-10-CM | POA: Diagnosis not present

## 2019-09-08 DIAGNOSIS — L821 Other seborrheic keratosis: Secondary | ICD-10-CM | POA: Diagnosis not present

## 2019-09-08 DIAGNOSIS — L570 Actinic keratosis: Secondary | ICD-10-CM | POA: Diagnosis not present

## 2019-09-08 NOTE — Telephone Encounter (Signed)
Metoprolol 50 mg twice daily Kirk Ruths

## 2019-09-08 NOTE — Telephone Encounter (Signed)
It is very unclear from past notes what patient should be taking. Routing to Dr Stanford Breed to review and advise.

## 2019-09-08 NOTE — Telephone Encounter (Signed)
Refill sent.

## 2019-09-08 NOTE — Telephone Encounter (Signed)
Please advise if OK to refill. Listed under Historical provider without directions. Thank you!

## 2019-10-08 ENCOUNTER — Other Ambulatory Visit: Payer: Self-pay | Admitting: Family Medicine

## 2019-10-08 DIAGNOSIS — E119 Type 2 diabetes mellitus without complications: Secondary | ICD-10-CM

## 2019-10-08 MED ORDER — LANTUS SOLOSTAR 100 UNIT/ML ~~LOC~~ SOPN: PEN_INJECTOR | SUBCUTANEOUS | 2 refills | Status: DC

## 2019-10-08 NOTE — Telephone Encounter (Signed)
Rx sent 

## 2019-10-08 NOTE — Telephone Encounter (Signed)
A diagnosis code is needed for the below medication

## 2019-10-08 NOTE — Telephone Encounter (Signed)
Medication Refill - Medication: Insulin Glargine   Preferred Pharmacy (with phone number or street name): [facility] [place], [address] AT [place]  [place] [address]  Phone: [phone] Fax: [phone]  Not a 24 hour pharmacy; exact hours not known.    Agent: Please be advised that RX refills may take up to 3 business days. We ask that you follow-up with your pharmacy.

## 2019-10-15 ENCOUNTER — Telehealth: Payer: Self-pay | Admitting: Cardiology

## 2019-10-15 NOTE — Telephone Encounter (Signed)
° °  Patient calling to report she "feels like hell". Hurting all over. SOB on exertion for 1 month No chest pain

## 2019-10-15 NOTE — Telephone Encounter (Signed)
Patient states for the past month she has not felt well- She has no appetite, she does mention having good BP, she states she hurts all over (back pain is worse), patient states she is unable to do anything without having to sit down.  Patient denies chest pain, and no swelling in hands/feet, patient does mention being SOB with exertion.   I advised if she had mentioned to her primary care doctor- she states that Dr.Matthews does nothing for her- which is why she contacts Dr.Crenshaw, I did advise her to let them know of these changes that she is having but I would let Dr.Crenshaw know of her changes and if any recommendations.

## 2019-10-15 NOTE — Telephone Encounter (Signed)
Noted. Patient states she will notify PCP at previous conversation as well.

## 2019-10-15 NOTE — Telephone Encounter (Signed)
Not sure sounds cardiac; would likely have her fu with primary care; PAOV if needed Kirk Ruths

## 2019-10-18 ENCOUNTER — Ambulatory Visit: Payer: HMO | Admitting: Family Medicine

## 2019-10-18 ENCOUNTER — Other Ambulatory Visit: Payer: Self-pay

## 2019-10-19 ENCOUNTER — Ambulatory Visit (INDEPENDENT_AMBULATORY_CARE_PROVIDER_SITE_OTHER): Payer: HMO | Admitting: Family Medicine

## 2019-10-19 ENCOUNTER — Encounter: Payer: Self-pay | Admitting: Family Medicine

## 2019-10-19 VITALS — BP 114/65 | HR 73 | Ht 64.0 in | Wt 150.5 lb

## 2019-10-19 DIAGNOSIS — J301 Allergic rhinitis due to pollen: Secondary | ICD-10-CM

## 2019-10-19 DIAGNOSIS — M48062 Spinal stenosis, lumbar region with neurogenic claudication: Secondary | ICD-10-CM | POA: Insufficient documentation

## 2019-10-19 MED ORDER — FLUTICASONE PROPIONATE 50 MCG/ACT NA SUSP: 2.0000 | Freq: Every day | NASAL | 6 refills | Status: DC

## 2019-10-19 MED ORDER — GABAPENTIN 100 MG PO CAPS: 100.0000 mg | ORAL_CAPSULE | Freq: Three times a day (TID) | ORAL | 3 refills | Status: DC

## 2019-10-19 NOTE — Assessment & Plan Note (Signed)
-  Current symptoms consistent with allergic rhintiis.  Will provide trial of flonase daily.  She will call or f/u if not improving.

## 2019-10-19 NOTE — Assessment & Plan Note (Signed)
-  Worsening symptoms of spinal stenosis.  No improvement with ESI previously.  Gabapentin at current dose is too sedating so she is not taking.  Will decrease to 100mg  TID.  Discussed changing sertraline to cymbalta however she thinks she has tried this previously and side effects.   Referral placed back to neurosurgery to see if there are additional options for management of her symptoms.

## 2019-10-19 NOTE — Progress Notes (Signed)
Kathleen Griffith - 78 y.o. female MRN [id number]  Date of birth: 1941-11-22   This visit type was conducted due to national recommendations for restrictions regarding the COVID-19 Pandemic (e.g. social distancing).  This format is felt to be most appropriate for this patient at this time.  All issues noted in this document were discussed and addressed.  No physical exam was performed (except for noted visual exam findings with Video Visits).  I discussed the limitations of evaluation and management by telemedicine and the availability of in person appointments. The patient expressed understanding and agreed to proceed.  I connected with@ on 10/19/19 at  8:20 AM EDT by a video enabled telemedicine application and verified that I am speaking with the correct person using two identifiers.  Interactive audio and video telecommunications were attempted between this provider and patient, however failed, due to patient having technical difficulties OR patient did not have access to video capability.  We continued and completed visit with audio only.   Present for visit: Kathleen Griffith   Patient Location: Home [place] [id number]   Provider location:   [facility]   Chief Complaint  Patient presents with  . Fatigue    pt been feeling tired and weak for months... the last 3 days she's been having a sore throat, earache and headaches.    HPI  Kathleen Griffith is a 78 y.o. female who presents via audio/video conferencing for a telehealth visit today.  She has complaint of chronic pain and congestion with ear pressure and sore throat.    -Chronic pain:  Reports that she "hurts all over" but majority of her pain is in her lower back with radiation into her legs.  She reports that she can only do short bursts of activity before the pain becomes too much to bear.  She finds simple household chores are difficult.  She has had MRI of her lumbar spine  showing spinal stenosis and has seen neurology with diagnosis of neurogenic claudication.  She had seen Dr. Trenton Gammon previously and was told that she did not need surgery at that time and was sent for [facility].  She reports no improvement with this.  She has been prescribed gabapentin by her rheumatologist but she reports that the current dose makes her too sedated.  She has also tried norco in the past and she reports that this was not very helpful.    -Congestion:  Congestion with post nasal drainage, sore throat, ear pressure and mild headache x3 days.  She denies fever, chills, cough, increased shortness of breath, body aches or GI symptoms.  She has not tried anything for this.    ROS:  A comprehensive ROS was completed and negative except as noted per HPI  Past Medical History:  Diagnosis Date  . Anemia    microcytic   . Arthritis   . Atrial fibrillation ([place])   . Cataract    REMOVED BILATERAL  . Colon polyps   . Diabetes mellitus ([place])   . Fibromyalgia   . Gastroparesis   . Hyperlipidemia   . Hypertension   . Kidney stones   . Melanoma ([place])   . PMR (polymyalgia rheumatica) ([facility])   . PVD (peripheral vascular disease) ([place])      Past Surgical History:  Procedure Laterality Date  . APPENDECTOMY    . BREAST BIOPSY    . CESAREAN SECTION    . COLONOSCOPY    . COLONOSCOPY WITH PROPOFOL  N/A 12/26/2015   Procedure: COLONOSCOPY WITH PROPOFOL;  Surgeon: Manus Gunning, MD;  Location: [facility];  Service: Gastroenterology;  Laterality: N/A;  . ESOPHAGOGASTRODUODENOSCOPY (EGD) WITH PROPOFOL N/A 12/26/2015   Procedure: ESOPHAGOGASTRODUODENOSCOPY (EGD) WITH PROPOFOL;  Surgeon: Manus Gunning, MD;  [facility]: [facility];  Service: Gastroenterology;  Laterality: N/A;  . LEFT HEART CATH AND CORONARY ANGIOGRAPHY N/A 06/03/2017   Procedure: Left Heart Cath and Coronary Angiography;  Surgeon: Martinique, Peter M, MD;  Location: [facility];  Service: Cardiovascular;  Laterality:  N/A;  . POLYPECTOMY      Family History  Problem Relation Age of Onset  . CAD Father        MI and CABG  . Diabetes Father   . Heart disease Father   . Colon cancer Mother 74    Social History   Socioeconomic History  . Marital status: Widowed    Spouse name: Not on file  . Number of children: 1  . Years of education: Not on file  . Highest education level: Not on file  Occupational History  . Not on file  Social Needs  . Financial resource strain: Not on file  . Food insecurity    Worry: Never true    Inability: Never true  . Transportation needs    Medical: No    Non-medical: No  Tobacco Use  . Smoking status: Current Every Day Smoker    Packs/day: 1.00    Types: Cigarettes  . Smokeless tobacco: Never Used  Substance and Sexual Activity  . Alcohol use: No    Alcohol/week: 0.0 standard drinks  . Drug use: No  . Sexual activity: Not on file  Lifestyle  . Physical activity    Days per week: Not on file    Minutes per session: Not on file  . Stress: Not on file  Relationships  . Social Herbalist on phone: Not on file    Gets together: Not on file    Attends religious service: Not on file    Active member of club or organization: Not on file    Attends meetings of clubs or organizations: Not on file    Relationship status: Not on file  . Intimate partner violence    Fear of current or ex partner: Not on file    Emotionally abused: Not on file    Physically abused: Not on file    Forced sexual activity: Not on file  Other Topics Concern  . Not on file  Social History Narrative  . Not on file     Current Outpatient Medications:  .  acetaminophen (TYLENOL) 650 MG CR tablet, Take 1,300 mg by mouth every 8 (eight) hours as needed for pain., Disp: , Rfl:  .  alendronate (FOSAMAX) 70 MG tablet, Take 70 mg by mouth once a week. , Disp: , Rfl:  .  gabapentin (NEURONTIN) 300 MG capsule, Take 300 mg by mouth as needed (1-2 tabs PRN)., Disp: , Rfl:  .   glucose blood (ONETOUCH ULTRA) test strip, Use to test blood sugars 2 times daily., Disp: 100 each, Rfl: 12 .  Insulin Glargine (LANTUS SOLOSTAR) 100 UNIT/ML Solostar Pen, ADMINISTER 35 UNITS UNDER THE SKIN DAILY, Disp: 15 mL, Rfl: 2 .  lidocaine (LIDODERM) 5 %, Place 1 patch onto the skin daily. Remove & Discard patch within 12 hours or as directed by MD, Disp: 30 patch, Rfl: 2 .  metoprolol tartrate (LOPRESSOR) 25 MG tablet, TAKE  2 TABLETS(50 MG) BY MOUTH TWICE DAILY, Disp: 360 tablet, Rfl: 1 .  nitroGLYCERIN (NITROSTAT) 0.3 MG SL tablet, Place 1 tablet (0.3 mg total) under the tongue every 5 (five) minutes as needed for chest pain., Disp: 100 tablet, Rfl: 1 .  pravastatin (PRAVACHOL) 20 MG tablet, Take 1 tablet (20 mg total) by mouth daily., Disp: 90 tablet, Rfl: 2 .  rivaroxaban (XARELTO) 20 MG TABS tablet, Take 1 tablet (20 mg total) by mouth daily with supper., Disp: 90 tablet, Rfl: 1 .  sertraline (ZOLOFT) 100 MG tablet, Take 1 tablet (100 mg total) by mouth daily., Disp: 90 tablet, Rfl: 1 .  triamcinolone cream (KENALOG) 0.1 %, Apply 1 application topically 2 (two) times daily., Disp: 30 g, Rfl: 0 .  albuterol (PROVENTIL HFA;VENTOLIN HFA) 108 (90 Base) MCG/ACT inhaler, Inhale 1 puff into the lungs every 6 (six) hours as needed for wheezing or shortness of breath., Disp: , Rfl:   EXAM:  VITALS per patient if applicable: BP 948/54 Comment: per pt.  Pulse 73 Comment: per pt.  Ht 5\' 4"  (1.626 m)   Wt 150 lb 8 oz (68.3 kg) Comment: per pt.  BMI 25.83 kg/m   GENERAL: alert, oriented, appears well and in no acute distress  PSYCH/NEURO: pleasant and cooperative, no obvious depression or anxiety, speech and thought processing grossly intact  ASSESSMENT AND PLAN:  Discussed the following assessment and plan:  No problem-specific Assessment & Plan notes found for this encounter.       I discussed the assessment and treatment plan with the patient. The patient was provided an  opportunity to ask questions and all were answered. The patient agreed with the plan and demonstrated an understanding of the instructions.   The patient was advised to call back or seek an in-person evaluation if the symptoms worsen or if the condition fails to improve as anticipated.     Luetta Nutting, DO

## 2019-10-20 ENCOUNTER — Telehealth: Payer: Self-pay | Admitting: Cardiology

## 2019-10-20 NOTE — Telephone Encounter (Signed)
Spoke with pt, aware unsure of who called. Appointments confirmed with the patient.

## 2019-10-20 NOTE — Telephone Encounter (Signed)
New message:    Patient calling stating that some one called her I did not see a note.

## 2019-10-26 ENCOUNTER — Other Ambulatory Visit: Payer: Self-pay

## 2019-10-26 ENCOUNTER — Encounter (HOSPITAL_COMMUNITY): Payer: Self-pay | Admitting: Emergency Medicine

## 2019-10-26 ENCOUNTER — Emergency Department (HOSPITAL_COMMUNITY)
Admission: EM | Admit: 2019-10-26 | Discharge: 2019-10-26 | Disposition: A | Discharge status: Home / Self Care | Payer: HMO | Attending: Emergency Medicine | Admitting: Emergency Medicine

## 2019-10-26 DIAGNOSIS — M7918 Myalgia, other site: Secondary | ICD-10-CM | POA: Diagnosis not present

## 2019-10-26 DIAGNOSIS — R519 Headache, unspecified: Secondary | ICD-10-CM | POA: Insufficient documentation

## 2019-10-26 DIAGNOSIS — R5383 Other fatigue: Secondary | ICD-10-CM | POA: Diagnosis present

## 2019-10-26 DIAGNOSIS — Z5321 Procedure and treatment not carried out due to patient leaving prior to being seen by health care provider: Secondary | ICD-10-CM | POA: Diagnosis not present

## 2019-10-26 NOTE — ED Triage Notes (Signed)
Pt reports for over a month having fatigue, body aches esp when getting up in the morning, headaches and dizziness and no appetite. Denies headache at this time.

## 2019-11-08 ENCOUNTER — Telehealth: Payer: Self-pay | Admitting: Cardiology

## 2019-11-08 NOTE — Telephone Encounter (Signed)
Per pt wants to speak with Fredia Beets RN would not elaborate on message .Will forward message to [facility] for review ./cy

## 2019-11-08 NOTE — Telephone Encounter (Signed)
Patient states she is not feeling well. She states she has not been feeling well for about a month.  She states she is not having SOB, swelling or chest pain.  She is dizzy, and does have DOE which isn't anything new.  She states, she can hardly walk, and has no appetite, nothing taste good to her.

## 2019-11-08 NOTE — Progress Notes (Signed)
HPI: FU atrial fibrillation. She has a history of paroxysmal atrial fibrillation. Abdominal ultrasound May 2016 showed no aneurysm. Patient admitted with non-ST elevation myocardial infarction in June 2018. Cardiac catheterization revealed a 20% proximal to mid LAD and normal left ventricular end-diastolic pressure. Echocardiogram showed ejection fraction 35-00%, grade 2 diastolic dysfunction, mild tricuspid regurgitation, severely elevated pulmonary pressure. Sludge noted LV apex. 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 Was noted to have intermittent atrial fibrillation;felt to be secondary to stress of sepsis. Carotid Dopplers November 2020 showed 1 to 39% bilateral stenosis.  Sincelast seen, there is no dyspnea, chest pain, palpitations or syncope.  No bleeding.  Current Outpatient Medications  Medication Sig Dispense Refill  . acetaminophen (TYLENOL) 650 MG CR tablet Take 1,300 mg by mouth every 8 (eight) hours as needed for pain.    Marland Kitchen albuterol (PROVENTIL HFA;VENTOLIN HFA) 108 (90 Base) MCG/ACT inhaler Inhale 1 puff into the lungs every 6 (six) hours as needed for wheezing or shortness of breath.    Marland Kitchen alendronate (FOSAMAX) 70 MG tablet Take 70 mg by mouth once a week.     . fluticasone (FLONASE) 50 MCG/ACT nasal spray Place 2 sprays into both nostrils daily. 16 g 6  . gabapentin (NEURONTIN) 100 MG capsule Take 1 capsule (100 mg total) by mouth 3 (three) times daily. 90 capsule 3  . glucose blood (ONETOUCH ULTRA) test strip Use to test blood sugars 2 times daily. 100 each 12  . Insulin Glargine (LANTUS SOLOSTAR) 100 UNIT/ML Solostar Pen ADMINISTER 35 UNITS UNDER THE SKIN DAILY (Patient taking differently: 30 Units. ADMINISTER 35 UNITS UNDER THE SKIN DAILY) 15 mL 2  . lidocaine (LIDODERM) 5 % Place 1 patch onto the skin daily.  Remove & Discard patch within 12 hours or as directed by MD 30 patch 2  . metoprolol tartrate (LOPRESSOR) 25 MG tablet TAKE 2 TABLETS(50 MG) BY MOUTH TWICE DAILY 360 tablet 1  . nitroGLYCERIN (NITROSTAT) 0.3 MG SL tablet Place 1 tablet (0.3 mg total) under the tongue every 5 (five) minutes as needed for chest pain. 100 tablet 1  . pravastatin (PRAVACHOL) 20 MG tablet Take 1 tablet (20 mg total) by mouth daily. 90 tablet 2  . rivaroxaban (XARELTO) 20 MG TABS tablet Take 1 tablet (20 mg total) by mouth daily with supper. 90 tablet 1  . sertraline (ZOLOFT) 100 MG tablet Take 1 tablet (100 mg total) by mouth daily. 90 tablet 1  . triamcinolone cream (KENALOG) 0.1 % Apply 1 application topically 2 (two) times daily. 30 g 0   No current facility-administered medications for this visit.      Past Medical History:  Diagnosis Date  . Anemia    microcytic   . Arthritis   . Atrial fibrillation ([place])   . Cataract    REMOVED BILATERAL  . Colon polyps   . Diabetes mellitus ([place])   . Fibromyalgia   . Gastroparesis   . Hyperlipidemia   . Hypertension   . Kidney stones   . Melanoma ([place])   . PMR (polymyalgia rheumatica) ([facility])   . PVD (peripheral vascular disease) ([place])     Past Surgical History:  Procedure Laterality Date  . APPENDECTOMY    . BREAST BIOPSY    . CESAREAN SECTION    . COLONOSCOPY    . COLONOSCOPY WITH PROPOFOL N/A 12/26/2015   Procedure: COLONOSCOPY  WITH PROPOFOL;  Surgeon: Manus Gunning, MD;  Location: [facility];  Service: Gastroenterology;  Laterality: N/A;  . ESOPHAGOGASTRODUODENOSCOPY (EGD) WITH PROPOFOL N/A 12/26/2015   Procedure: ESOPHAGOGASTRODUODENOSCOPY (EGD) WITH PROPOFOL;  Surgeon: Manus Gunning, MD;  Location: [facility];  Service: Gastroenterology;  Laterality: N/A;  . LEFT HEART CATH AND CORONARY ANGIOGRAPHY N/A 06/03/2017   Procedure: Left Heart Cath and Coronary Angiography;  Surgeon: Martinique, Peter M, MD;  Location: [facility];   Service: Cardiovascular;  Laterality: N/A;  . POLYPECTOMY      Social History   Socioeconomic History  . Marital status: Widowed    Spouse name: Not on file  . Number of children: 1  . Years of education: Not on file  . Highest education level: Not on file  Occupational History  . Not on file  Social Needs  . Financial resource strain: Not on file  . Food insecurity    Worry: Never true    Inability: Never true  . Transportation needs    Medical: No    Non-medical: No  Tobacco Use  . Smoking status: Current Every Day Smoker    Packs/day: 1.00    Types: Cigarettes  . Smokeless tobacco: Never Used  Substance and Sexual Activity  . Alcohol use: No    Alcohol/week: 0.0 standard drinks  . Drug use: No  . Sexual activity: Not on file  Lifestyle  . Physical activity    Days per week: Not on file    Minutes per session: Not on file  . Stress: Not on file  Relationships  . Social Herbalist on phone: Not on file    Gets together: Not on file    Attends religious service: Not on file    Active member of club or organization: Not on file    Attends meetings of clubs or organizations: Not on file    Relationship status: Not on file  . Intimate partner violence    Fear of current or ex partner: Not on file    Emotionally abused: Not on file    Physically abused: Not on file    Forced sexual activity: Not on file  Other Topics Concern  . Not on file  Social History Narrative  . Not on file    Family History  Problem Relation Age of Onset  . CAD Father        MI and CABG  . Diabetes Father   . Heart disease Father   . Colon cancer Mother 35    ROS: Back and bilateral leg pain but no fevers or chills, productive cough, hemoptysis, dysphasia, odynophagia, melena, hematochezia, dysuria, hematuria, rash, seizure activity, orthopnea, PND, pedal edema, claudication. Remaining systems are negative.  Physical Exam: Well-developed well-nourished in no acute  distress.  Skin is warm and dry.  HEENT is normal.  Neck is supple.  Chest is clear to auscultation with normal expansion.  Cardiovascular exam is regular rate and rhythm.  Abdominal exam nontender or distended. No masses palpated. Extremities show no edema. neuro grossly intact  ECG-normal sinus rhythm, normal axis, no ST changes.  First-degree AV block.  Personally reviewed  A/P  1 paroxysmal atrial fibrillation-patient is in sinus rhythm.  Plan to continue metoprolol for rate control if atrial fibrillation recurs.  Continue Xarelto. Check hgb and renal function.   2 hypertension-patient's blood pressure is controlled.  Continue present medications and follow.  3 hyperlipidemia-continue statin.  As outlined previously she did  not tolerate high-dose statins in the past.  4 history of stress-induced cardiomyopathy-LV function has normalized on most recent echocardiogram.  5 carotid artery disease/peripheral vascular disease-she will need follow-up carotid Doppler scheduled 11/21.  Continue statin.  No aspirin given need for Xarelto.  6 tobacco abuse-patient counseled on discontinuing.  Kirk Ruths, MD

## 2019-11-08 NOTE — Telephone Encounter (Signed)
Spoke with pt, she has multiple complaints today. She has been having sharpe pains in her head that will last 15-20 min then will go away. She reports dizziness for the last 2 months and feeling off balance. There is no change in dizziness with movement or lying down. It usually occurs when getting up. She reports her bp during the dizziness is around 116. She denies any episodes of atrial fib, denies chest pain. She has SOB with exertion but it is her usual and has not changed. She has no appetite and food does not taste good. She denies fever or covid symptoms. She complains of leg pain and cramping and not being able to walk. Patient given the okay to stop pravastatin to see if that will help with the leg pain. She is changing to a new medical doctor but can not see them until after the first of the year. She will try checking standing from sitting orthostatics to see if her bp is dropping. She is aware dr Stanford Breed is not in the office this week and she does not want to see an APP. She has a follow up appointment Monday next week and will call prior to that appointment with changes. Pt agreed with this plan.

## 2019-11-10 ENCOUNTER — Telehealth: Payer: Self-pay | Admitting: Cardiology

## 2019-11-10 NOTE — Telephone Encounter (Signed)
New Message   Patient is calling because she would need to bring her son with her to her appt due to her not being able to walk while. Please call to discuss.

## 2019-11-11 ENCOUNTER — Other Ambulatory Visit: Payer: Self-pay | Admitting: Cardiology

## 2019-11-11 ENCOUNTER — Encounter: Payer: Self-pay | Admitting: *Deleted

## 2019-11-11 ENCOUNTER — Encounter (HOSPITAL_COMMUNITY): Payer: HMO

## 2019-11-11 ENCOUNTER — Other Ambulatory Visit: Payer: Self-pay

## 2019-11-11 ENCOUNTER — Ambulatory Visit (HOSPITAL_COMMUNITY)
Admission: RE | Admit: 2019-11-11 | Discharge: 2019-11-11 | Disposition: A | Discharge status: Home / Self Care | Payer: HMO | Source: Ambulatory Visit | Attending: Cardiovascular Disease | Admitting: Cardiovascular Disease

## 2019-11-11 ENCOUNTER — Other Ambulatory Visit: Payer: Self-pay | Admitting: *Deleted

## 2019-11-11 DIAGNOSIS — I6523 Occlusion and stenosis of bilateral carotid arteries: Secondary | ICD-10-CM | POA: Diagnosis not present

## 2019-11-11 NOTE — Telephone Encounter (Signed)
Left detailed message with current COVID restrictions.

## 2019-11-11 NOTE — Progress Notes (Signed)
vas 

## 2019-11-15 ENCOUNTER — Encounter: Payer: Self-pay | Admitting: Cardiology

## 2019-11-15 ENCOUNTER — Ambulatory Visit (INDEPENDENT_AMBULATORY_CARE_PROVIDER_SITE_OTHER): Payer: HMO | Admitting: Cardiology

## 2019-11-15 ENCOUNTER — Other Ambulatory Visit: Payer: Self-pay

## 2019-11-15 VITALS — BP 124/70 | HR 65 | Temp 97.5°F | Ht 63.0 in | Wt 158.0 lb

## 2019-11-15 DIAGNOSIS — I480 Paroxysmal atrial fibrillation: Secondary | ICD-10-CM | POA: Diagnosis not present

## 2019-11-15 DIAGNOSIS — I679 Cerebrovascular disease, unspecified: Secondary | ICD-10-CM | POA: Diagnosis not present

## 2019-11-15 DIAGNOSIS — I10 Essential (primary) hypertension: Secondary | ICD-10-CM

## 2019-11-15 DIAGNOSIS — E7800 Pure hypercholesterolemia, unspecified: Secondary | ICD-10-CM

## 2019-11-15 NOTE — Patient Instructions (Signed)

## 2019-11-16 LAB — BASIC METABOLIC PANEL
BUN/Creatinine Ratio: 25 (ref 12–28)
BUN: 27 mg/dL (ref 8–27)
CO2: 22 mmol/L (ref 20–29)
Calcium: 8.9 mg/dL (ref 8.7–10.3)
Chloride: 103 mmol/L (ref 96–106)
Creatinine, Ser: 1.1 mg/dL — ABNORMAL HIGH (ref 0.57–1.00)
GFR calc Af Amer: 56 mL/min/{1.73_m2} — ABNORMAL LOW (ref >59)
GFR calc non Af Amer: 48 mL/min/{1.73_m2} — ABNORMAL LOW (ref >59)
Glucose: 188 mg/dL — ABNORMAL HIGH (ref 65–99)
Potassium: 4.3 mmol/L (ref 3.5–5.2)
Sodium: 145 mmol/L — ABNORMAL HIGH (ref 134–144)

## 2019-11-16 LAB — CBC
Hematocrit: 43.2 % (ref 34.0–46.6)
Hemoglobin: 14.8 g/dL (ref 11.1–15.9)
MCH: 30.6 pg (ref 26.6–33.0)
MCHC: 34.3 g/dL (ref 31.5–35.7)
MCV: 89 fL (ref 79–97)
Platelets: 318 10*3/uL (ref 150–450)
RBC: 4.84 x10E6/uL (ref 3.77–5.28)
RDW: 12.6 % (ref 11.7–15.4)
WBC: 7.4 10*3/uL (ref 3.4–10.8)

## 2019-11-17 ENCOUNTER — Telehealth: Payer: Self-pay | Admitting: *Deleted

## 2019-11-17 MED ORDER — APIXABAN 5 MG PO TABS: 5.0000 mg | ORAL_TABLET | Freq: Two times a day (BID) | ORAL | 11 refills | Status: DC

## 2019-11-17 NOTE — Telephone Encounter (Signed)
Spoke with pt, Kathleen Griffith. New script sent to the pharmacy and savings card mailed to the patient.

## 2019-11-17 NOTE — Telephone Encounter (Signed)
-----   Message from Lelon Perla, MD sent at 11/16/2019  9:43 AM EST ----- DC xarelto after present supply ends and treat with apixaban 5 mg BID Kirk Ruths

## 2019-11-18 ENCOUNTER — Other Ambulatory Visit: Payer: Self-pay

## 2019-11-18 NOTE — Patient Outreach (Signed)
  [facility]) Care Management Chronic Special Needs Program  11/18/2019  Name: Tyiesha Brackney DOB: 08/27/1941  MRN: [id number]  Ms. Pollyann Roa is enrolled in a chronic special needs plan for Diabetes. Client called with no answer No answer and HIPAA compliant message left. 1st attempt Plan for 2nd outreach call in 2-3 weeks Chronic care management coordinator will attempt outreach in 2-3 weeks.   Peter Garter RN, Jackquline Denmark, CDE Chronic Care Management Coordinator [facility] [phone]

## 2019-11-29 ENCOUNTER — Other Ambulatory Visit: Payer: Self-pay

## 2019-11-29 DIAGNOSIS — L820 Inflamed seborrheic keratosis: Secondary | ICD-10-CM | POA: Diagnosis not present

## 2019-11-29 DIAGNOSIS — L918 Other hypertrophic disorders of the skin: Secondary | ICD-10-CM | POA: Diagnosis not present

## 2019-11-29 DIAGNOSIS — Z85828 Personal history of other malignant neoplasm of skin: Secondary | ICD-10-CM | POA: Diagnosis not present

## 2019-11-29 NOTE — Patient Outreach (Signed)
  [facility]) Care Management Chronic Special Needs Program  11/29/2019  Name: Kathleen Griffith DOB: [birth year]  MRN: [id number]  Ms. [name] is enrolled in a chronic special needs plan for Diabetes. Client called with no answer No answer and HIPAA compliant message left. 2nd attempt Plan for 3rd outreach call in one week Chronic care management coordinator will attempt outreach in one week.   Peter Garter RN, Jackquline Denmark, CDE Chronic Care Management Coordinator [facility] [phone]

## 2019-12-01 ENCOUNTER — Other Ambulatory Visit: Payer: Self-pay

## 2019-12-01 NOTE — Patient Outreach (Signed)
  [facility]) Care Management Chronic Special Needs Program  12/01/2019  Name: Kathleen Griffith DOB: 1941/11/06  MRN: [id number]  Kathleen Griffith is enrolled in a chronic special needs plan for Diabetes. Client called for 3rd outreach attempt.  No answer and HIPAA compliant message left  Client has not responded to three outreach attempts to update individualized care plan   The client's individualized care plan was developed based upon the completed health risk assessment. Goals    . Client understands the importance of follow-up with providers by attending scheduled visits    . Client verbalizes knowledge of Heart Attack self management skills by 9 months(continue 12/01/19)    . Client will report no worsening of symptoms of Atrial Fibrillation within the next 9 months(continue 12/01/19)    . Client will use Assistive Devices as needed and verbalize understanding of device use    . Client will verbalize knowledge of self management of Hypertension as evidences by BP reading of 140/90 or less; or as defined by provider    . Decrease inpatient diabetes admissions/readmissions with in the year    . HEMOGLOBIN A1C < 7.0     Diabetes self management actions:  Glucose monitoring per provider recommendations  Perform Quality checks on blood meter  Eat Healthy  Check feet daily  Visit provider every 3-6 months as directed  Hbg A1C level every 3-6 months.  Eye Exam yearly    . Maintain timely refills of diabetic medication as prescribed within the year .    Marland Kitchen Obtain Annual Eye (retinal)  Exam     . Obtain Annual Foot Exam    . Obtain annual screen for micro albuminuria (urine) , nephropathy (kidney problems)    . Obtain Hemoglobin A1C at least 2 times per year     Completed  06/16/19    . Quit Smoking     Please review English as a second language teacher is  meeting diabetes self management goal of hemoglobin A1C of <7% with last reading of 5.8% Plan:   . Send  unsuccessful outreach letter with a copy of individualized care plan to client . Send individualized care plan to provider  Chronic care management coordinator will attempt outreach in 9 Months per tier level.     Peter Garter RN, Jackquline Denmark, CDE Chronic Care Management Coordinator [facility] [phone]

## 2019-12-03 ENCOUNTER — Other Ambulatory Visit: Payer: Self-pay

## 2019-12-03 NOTE — Patient Outreach (Signed)
[facility]) Care Management Chronic Special Needs Program  12/03/2019  Name: Nattaly Yebra DOB: 17-Dec-1941  MRN: [id number]  Ms. Chermaine Schnyder is enrolled in a chronic special needs plan for Diabetes. Reviewed and updated care plan. Client returned call from 12/01/19  Subjective: Client states that she has not felt good for months.  States she has tapered herself off of her Zoloft and pravastatin and she is feeling some better.  Denies any depression.  States she is still taking the Xarelto and will switch to Eliquis when she has taken all of the Xarelto.  States her B/P has been good with a reading of 128/88 this morning.  States her blood sugars range from 80-117 most mornings but she has had readings as low a 53 and 68.  States she eats and sometimes takes less insulin when she is lower.  States she still smokes and does not wish to quit at this time  Goals Addressed            This Visit's Progress   . COMPLETED: Client understands the importance of follow-up with providers by attending scheduled visits   On track    Reports keeping appts    . Client verbalizes knowledge of Heart Attack self management skills by 9 months(continue 12/01/19)   On track   . Client will report no worsening of symptoms of Atrial Fibrillation within the next 9 months(continue 12/01/19)   On track   . COMPLETED: Client will use Assistive Devices as needed and verbalize understanding of device use   On track    Reports no difficulty with glucometer    . COMPLETED: Client will verbalize knowledge of self management of Hypertension as evidences by BP reading of 140/90 or less; or as defined by provider   On track    Reports B/P less than 140/90    . COMPLETED: Decrease inpatient diabetes admissions/readmissions with in the year   On track    No admissions    . HEMOGLOBIN A1C < 7.0        Diabetes self management actions:  Glucose monitoring per provider recommendations  Perform  Quality checks on blood meter  Eat Healthy  Check feet daily  Visit provider every 3-6 months as directed  Hbg A1C level every 3-6 months.  Eye Exam yearly    . Maintain timely refills of diabetic medication as prescribed within the year .   On track   . COMPLETED: Obtain Annual Eye (retinal)  Exam        Reports on 09/09/2019    . Obtain Annual Foot Exam   On track   . Obtain annual screen for micro albuminuria (urine) , nephropathy (kidney problems)   On track   . Obtain Hemoglobin A1C at least 2 times per year   On track    Completed  06/16/19    . Quit Smoking   Not on track    Please review Musician     Client is meeting diabetes self management goal of hemoglobin A1C of <7% with last reading of 5.8% Reviewed s/s of hypoglycemia and actions to take.  Encouraged to discuss with doctor to see if she needs to lower her insulin dose  Encouraged to review her medications with her doctor and instructed on the importance of not stopping some medications suddenly. Reviewed low CHO low sodium diet and encouraged to eat well balanced meals with protein Encouraged to increase her activity as tolerated Reviewed number for 24-hour  nurse Line Reviewed COVID 19 precautions Plan:  Send successful outreach letter with a copy of their individualized care plan and Send individual care plan to provider  Chronic care management coordinator will outreach in:  6-9 Months per tier level     Peter Garter RN, Jackquline Denmark, [place] Management Coordinator [facility] [phone]

## 2019-12-08 ENCOUNTER — Ambulatory Visit: Payer: HMO | Admitting: Family Medicine

## 2019-12-20 ENCOUNTER — Other Ambulatory Visit: Payer: Self-pay | Admitting: Family Medicine

## 2019-12-20 NOTE — Telephone Encounter (Signed)
Pt called in to make provider aware that she is completely out and would like to have them sent in as soon as possible

## 2019-12-21 ENCOUNTER — Telehealth: Payer: Self-pay | Admitting: *Deleted

## 2019-12-21 MED ORDER — RIVAROXABAN 20 MG PO TABS: 20.0000 mg | ORAL_TABLET | Freq: Every day | ORAL | 11 refills | Status: DC

## 2019-12-21 NOTE — Telephone Encounter (Signed)
DC apixaban; resume xarelto 20 mg daily; bmet 6 months Kirk Ruths

## 2019-12-21 NOTE — Telephone Encounter (Signed)
Spoke with pt, aware of the change. New script sent to the pharmacy

## 2019-12-21 NOTE — Telephone Encounter (Signed)
Spoke with pt, she started the eliquis about one week ago and since starting she is having stomach pain. The pain starts in her stomach and will go up into her chest. Antiacids do not help, the only thing that helped is hydrocodone. She has not seen any dark stools and she reports belching a lot and reflux. She reports her stomach hurts after eating anything. Will forward for dr Stanford Breed review

## 2019-12-22 DIAGNOSIS — R1013 Epigastric pain: Secondary | ICD-10-CM | POA: Diagnosis not present

## 2019-12-27 ENCOUNTER — Other Ambulatory Visit: Payer: Self-pay

## 2019-12-27 ENCOUNTER — Emergency Department (HOSPITAL_COMMUNITY)
Admission: EM | Admit: 2019-12-27 | Discharge: 2019-12-28 | Disposition: A | Discharge status: Home / Self Care | Payer: HMO | Attending: Emergency Medicine | Admitting: Emergency Medicine

## 2019-12-27 DIAGNOSIS — Z7901 Long term (current) use of anticoagulants: Secondary | ICD-10-CM | POA: Diagnosis not present

## 2019-12-27 DIAGNOSIS — I480 Paroxysmal atrial fibrillation: Secondary | ICD-10-CM | POA: Diagnosis not present

## 2019-12-27 DIAGNOSIS — R1084 Generalized abdominal pain: Secondary | ICD-10-CM | POA: Diagnosis not present

## 2019-12-27 DIAGNOSIS — Z794 Long term (current) use of insulin: Secondary | ICD-10-CM | POA: Insufficient documentation

## 2019-12-27 DIAGNOSIS — K2980 Duodenitis without bleeding: Secondary | ICD-10-CM | POA: Diagnosis not present

## 2019-12-27 DIAGNOSIS — F17210 Nicotine dependence, cigarettes, uncomplicated: Secondary | ICD-10-CM | POA: Diagnosis not present

## 2019-12-27 DIAGNOSIS — Z79899 Other long term (current) drug therapy: Secondary | ICD-10-CM | POA: Insufficient documentation

## 2019-12-27 DIAGNOSIS — I10 Essential (primary) hypertension: Secondary | ICD-10-CM | POA: Diagnosis not present

## 2019-12-27 DIAGNOSIS — R1013 Epigastric pain: Secondary | ICD-10-CM | POA: Diagnosis present

## 2019-12-27 DIAGNOSIS — E119 Type 2 diabetes mellitus without complications: Secondary | ICD-10-CM | POA: Insufficient documentation

## 2019-12-27 DIAGNOSIS — I252 Old myocardial infarction: Secondary | ICD-10-CM | POA: Insufficient documentation

## 2019-12-27 DIAGNOSIS — N281 Cyst of kidney, acquired: Secondary | ICD-10-CM | POA: Diagnosis not present

## 2019-12-27 DIAGNOSIS — K5730 Diverticulosis of large intestine without perforation or abscess without bleeding: Secondary | ICD-10-CM | POA: Diagnosis not present

## 2019-12-27 MED ORDER — SODIUM CHLORIDE 0.9 % IV BOLUS
1000.0000 mL | Freq: Once | INTRAVENOUS | Status: AC
  Administered 2019-12-27: 1000 mL via INTRAVENOUS

## 2019-12-27 MED ORDER — SODIUM CHLORIDE 0.9 % IV SOLN: Freq: Once | INTRAVENOUS | Status: AC

## 2019-12-27 MED ORDER — PANTOPRAZOLE SODIUM 40 MG IV SOLR
40.0000 mg | Freq: Once | INTRAVENOUS | Status: AC
  Administered 2019-12-27: 40 mg via INTRAVENOUS
  Filled 2019-12-27: qty 40

## 2019-12-27 NOTE — ED Notes (Signed)
Pt states she is incontinent, pure wic offered to obtain urine sample.

## 2019-12-27 NOTE — ED Provider Notes (Signed)
[facility] DEPT Provider Note   CSN: [id number] Arrival date & time: 12/27/19  2218     History Chief Complaint  Patient presents with  . Abdominal Pain    Kathleen Griffith is a 78 y.o. female.  Patient with history of atrial fibrillation, HTN, HLD, DM presents with epigastric abdominal pain for the past one month. She states pain started after she changed from Xarelto to Eliquis (Dr. Stanford Breed) as directed by her doctor. She took Eliquis for one week with daily pain and was instructed to stop and resume Xarelto. Pain has continued, aggravated by eating or drinking anything, becoming worse in the last one week to include nausea with vomiting. No hematemesis. She states she stopped taking her medications because she felt they may be contributing to symptoms. No chest pain, SOB, cough, fever at any time. She has experienced constipation that resolved with use of enema and has seen a small amount of BRB per rectum but no melena. No history of ulcers. She went to Urgent Care 5 days ago and was started on Prilosec BID, Mylanta and Zofran but pain continues. She is now feeling generally weak and drained.   The history is provided by the patient. No language interpreter was used.  Abdominal Pain Associated symptoms: constipation (See HPI.), nausea and vomiting   Associated symptoms: no chest pain, no chills, no fever and no shortness of breath        Past Medical History:  Diagnosis Date  . Anemia    microcytic   . Arthritis   . Atrial fibrillation ([place])   . Cataract    REMOVED BILATERAL  . Colon polyps   . Diabetes mellitus ([place])   . Fibromyalgia   . Gastroparesis   . Hyperlipidemia   . Hypertension   . Kidney stones   . Melanoma ([place])   . PMR (polymyalgia rheumatica) (HCC)   . PVD (peripheral vascular disease) [facility])     Patient Active Problem List   Diagnosis Date Noted  . 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 Allergic rhinitis due to pollen 10/19/2019  . Pruritus 06/17/2019  . Other fatigue 02/24/2019  . Melanoma ([place]) 12/10/2018  . Atrial fibrillation with RVR ([place]) 09/16/2018  . Depression 12/03/2017  . Normal coronary arteries 12/03/2017  . 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 Polymyalgia rheumatica ([place]) 12/03/2017  . NSTEMI (non-ST elevated myocardial infarction) ([place]) 06/02/2017  . PAF (paroxysmal atrial fibrillation) ([place]) 03/20/2016  . Insulin dependent diabetes mellitus 03/20/2016  . GERD (gastroesophageal reflux disease) 03/20/2016  . Anemia, iron deficiency   . Angiodysplasia of cecum   . Colon polyp   . 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 Chronic anticoagulation 07/28/2015  . Family history of colon cancer 07/28/2015  . Carotid artery disease ([place]) 04/04/2015  . Tobacco abuse 04/04/2015  . Essential hypertension 04/04/2015  . Hyperlipidemia 04/04/2015    Past Surgical History:  Procedure Laterality Date  . APPENDECTOMY    . BREAST BIOPSY    . CESAREAN SECTION    . COLONOSCOPY    . COLONOSCOPY WITH PROPOFOL N/A 12/26/2015   Procedure: COLONOSCOPY WITH PROPOFOL;  Surgeon: Manus Gunning, MD;  Location: [facility];  Service: Gastroenterology;  Laterality: N/A;  . ESOPHAGOGASTRODUODENOSCOPY (EGD) WITH PROPOFOL N/A 12/26/2015   Procedure: ESOPHAGOGASTRODUODENOSCOPY (EGD) WITH PROPOFOL;  Surgeon: Manus Gunning, MD;  Location: [facility];  Service: Gastroenterology;  Laterality: N/A;  . LEFT HEART CATH AND CORONARY ANGIOGRAPHY N/A 06/03/2017   Procedure:  Left Heart Cath and Coronary Angiography;  Surgeon: Martinique, Peter M, MD;  Location: [facility];  Service: Cardiovascular;  Laterality: N/A;  . POLYPECTOMY       OB History   No obstetric history on file.     Family History  Problem Relation Age of Onset  . CAD Father        MI and CABG  . Diabetes Father   . Heart disease Father   . Colon cancer Mother 42    Social History   Tobacco  Use  . Smoking status: Current Every Day Smoker    Packs/day: 1.00    Types: Cigarettes  . Smokeless tobacco: Never Used  Substance Use Topics  . Alcohol use: No    Alcohol/week: 0.0 standard drinks  . Drug use: No    Home Medications Prior to Admission medications   Medication Sig Start Date End Date Taking? Authorizing Provider  acetaminophen (TYLENOL) 650 MG CR tablet Take 1,300 mg by mouth every 8 (eight) hours as needed for pain.    [provider]  albuterol (PROVENTIL HFA;VENTOLIN HFA) 108 (90 Base) MCG/ACT inhaler Inhale 1 puff into the lungs every 6 (six) hours as needed for wheezing or shortness of breath.    [provider]  alendronate (FOSAMAX) 70 MG tablet Take 70 mg by mouth once a week.  11/02/18   [provider]  B-D UF III MINI PEN NEEDLES 31G X 5 MM MISC USE WITH LANTUS EVERY NIGHT AT BEDTIME 12/20/19   Luetta Nutting, DO  fluticasone (FLONASE) 50 MCG/ACT nasal spray Place 2 sprays into both nostrils daily. 10/19/19   Luetta Nutting, DO  gabapentin (NEURONTIN) 100 MG capsule Take 1 capsule (100 mg total) by mouth 3 (three) times daily. 10/19/19   Luetta Nutting, DO  glucose blood (ONETOUCH ULTRA) test strip Use to test blood sugars 2 times daily. 08/12/19   Luetta Nutting, DO  Insulin Glargine (LANTUS SOLOSTAR) 100 UNIT/ML Solostar Pen ADMINISTER 35 UNITS UNDER THE SKIN DAILY Patient taking differently: 30 Units. ADMINISTER 35 UNITS UNDER THE SKIN DAILY 10/08/19   Luetta Nutting, DO  lidocaine (LIDODERM) 5 % Place 1 patch onto the skin daily. Remove & Discard patch within 12 hours or as directed by MD 06/16/19   Luetta Nutting, DO  metoprolol tartrate (LOPRESSOR) 25 MG tablet TAKE 2 TABLETS(50 MG) BY MOUTH TWICE DAILY 09/08/19   Lelon Perla, MD  nitroGLYCERIN (NITROSTAT) 0.3 MG SL tablet Place 1 tablet (0.3 mg total) under the tongue every 5 (five) minutes as needed for chest pain. 06/09/18   Lelon Perla, MD  pravastatin (PRAVACHOL) 20 MG  tablet Take 1 tablet (20 mg total) by mouth daily. Patient not taking: Reported on 12/03/2019 06/21/19   Luetta Nutting, DO  rivaroxaban (XARELTO) 20 MG TABS tablet Take 1 tablet (20 mg total) by mouth daily with supper. 12/21/19   Lelon Perla, MD  sertraline (ZOLOFT) 100 MG tablet Take 1 tablet (100 mg total) by mouth daily. Patient not taking: Reported on 12/03/2019 06/22/19   Luetta Nutting, DO  triamcinolone cream (KENALOG) 0.1 % Apply 1 application topically 2 (two) times daily. 06/16/19   Luetta Nutting, DO    Allergies    Anoro ellipta [umeclidinium-vilanterol], Losartan, Norvasc [amlodipine besylate], Pregabalin, Bupropion, Metoclopramide, Lisinopril, Metformin, and Metformin and related  Review of Systems   Review of Systems  Constitutional: Negative for chills and fever.  HENT: Negative.   Respiratory: Negative.  Negative for shortness of  breath.   Cardiovascular: Negative.  Negative for chest pain and palpitations.  Gastrointestinal: Positive for abdominal pain, constipation (See HPI.), nausea and vomiting.  Genitourinary: Positive for decreased urine volume.  Musculoskeletal: Negative.   Skin: Negative.   Neurological: Positive for weakness. Negative for syncope.    Physical Exam Updated Vital Signs BP (!) 154/55 (BP Location: Left Arm)   Pulse 65   Temp 98.1 F (36.7 C) (Oral)   Resp (!) 21   SpO2 94%   Physical Exam Vitals and nursing note reviewed.  Constitutional:      General: She is not in acute distress.    Appearance: She is well-developed.  HENT:     Head: Normocephalic.  Cardiovascular:     Rate and Rhythm: Regular rhythm. Bradycardia present.     Heart sounds: No murmur.  Pulmonary:     Effort: Pulmonary effort is normal.     Breath sounds: Normal breath sounds. No wheezing, rhonchi or rales.  Abdominal:     General: Bowel sounds are decreased.     Palpations: Abdomen is soft.     Tenderness: There is abdominal tenderness in the right upper  quadrant and epigastric area. There is no guarding or rebound.  Musculoskeletal:        General: Normal range of motion.     Cervical back: Normal range of motion and neck supple.  Skin:    General: Skin is warm and dry.  Neurological:     Mental Status: She is alert and oriented to person, place, and time.     ED Results / Procedures / Treatments   Labs (all labs ordered are listed, but only abnormal results are displayed) Labs Reviewed - No data to display  EKG None  Radiology No results found.  Procedures Procedures (including critical care time)  Medications Ordered in ED Medications  pantoprazole (PROTONIX) injection 40 mg (has no administration in time range)  0.9 %  sodium chloride infusion (has no administration in time range)  sodium chloride 0.9 % bolus 1,000 mL (has no administration in time range)    ED Course  I have reviewed the triage vital signs and the nursing notes.  Pertinent labs & imaging results that were available during my care of the patient were reviewed by me and considered in my medical decision making (see chart for details).    MDM Rules/Calculators/A&P                      Patient to ED with persistent epigastric pain x 1 month, worsening over the last week, as detailed in the HPI.  Labs pending. IV Protonix ordered as symptoms follow pattern of gastritis and are mildly improved with medication for same. She declines pain medication for now. Plan to review labs but anticipate CT evaluation for ongoing pain.   Patient is resting quietly on re-evaluation. Pain medication ordered at her request. CT pending.   CT shows evidence of duodenitis. VSS. Labs without concerning change. On-re-evaluation she is feeling better. Feel discharge home is appropriate. Will continue BID Prilosec, add carafate. She has seen Dr. Havery Moros in the past for colonoscopy. Will refer back for further outpatient evaluation/management of duodenitis.   She states she  took herself off Xarelto. She is encouraged to call Dr. Stanford Breed regarding anticoagulation therapy for atrial fibrillation.    Final Clinical Impression(s) / ED Diagnoses Final diagnoses:  None   1. Duodenitis  Rx / DC Orders ED Discharge Orders  None       Charlann Lange, PA-C 12/28/19 [place], [place], MD 12/28/19 [phone]

## 2019-12-27 NOTE — ED Triage Notes (Signed)
78 yo female BIB [facility] from home. Pt c/o abdominal x 2 weeks. Pt states she hasn't been eating or drinking much due to pain. She states pain increases with eating. Pt went to urgent care on 12/23 and was diagnosed with gastritis and esophogititis and sent home on prescriptions for Prozac, Zofran and Mylanta. Pt states she is starting to have some relief but wanted to come in to be seen because she did not feel as though she was getting enough relief in a timely manner as per EMS. Pt has history of diabetes. Pt is AOX4  Vitals: BP 146/70 Hr 70 RR 18 SPO2 96 CBG 223 Temp 97.2

## 2019-12-28 ENCOUNTER — Telehealth: Payer: Self-pay | Admitting: Cardiology

## 2019-12-28 ENCOUNTER — Emergency Department (HOSPITAL_COMMUNITY): Payer: HMO

## 2019-12-28 DIAGNOSIS — K5730 Diverticulosis of large intestine without perforation or abscess without bleeding: Secondary | ICD-10-CM | POA: Diagnosis not present

## 2019-12-28 DIAGNOSIS — N281 Cyst of kidney, acquired: Secondary | ICD-10-CM | POA: Diagnosis not present

## 2019-12-28 LAB — CBC WITH DIFFERENTIAL/PLATELET
Abs Immature Granulocytes: 0.03 10*3/uL (ref 0.00–0.07)
Basophils Absolute: 0.1 10*3/uL (ref 0.0–0.1)
Basophils Relative: 1 %
Eosinophils Absolute: 0.3 10*3/uL (ref 0.0–0.5)
Eosinophils Relative: 3 %
HCT: 44.2 % (ref 36.0–46.0)
Hemoglobin: 14.5 g/dL (ref 12.0–15.0)
Immature Granulocytes: 0 %
Lymphocytes Relative: 21 %
Lymphs Abs: 1.9 10*3/uL (ref 0.7–4.0)
MCH: 30.8 pg (ref 26.0–34.0)
MCHC: 32.8 g/dL (ref 30.0–36.0)
MCV: 93.8 fL (ref 80.0–100.0)
Monocytes Absolute: 0.6 10*3/uL (ref 0.1–1.0)
Monocytes Relative: 6 %
Neutro Abs: 6.2 10*3/uL (ref 1.7–7.7)
Neutrophils Relative %: 69 %
Platelets: 297 10*3/uL (ref 150–400)
RBC: 4.71 MIL/uL (ref 3.87–5.11)
RDW: 13.4 % (ref 11.5–15.5)
WBC: 9.1 10*3/uL (ref 4.0–10.5)
nRBC: 0 % (ref 0.0–0.2)

## 2019-12-28 LAB — COMPREHENSIVE METABOLIC PANEL
ALT: 17 U/L (ref 0–44)
AST: 18 U/L (ref 15–41)
Albumin: 3.5 g/dL (ref 3.5–5.0)
Alkaline Phosphatase: 72 U/L (ref 38–126)
Anion gap: 11 (ref 5–15)
BUN: 31 mg/dL — ABNORMAL HIGH (ref 8–23)
CO2: 32 mmol/L (ref 22–32)
Calcium: 8.8 mg/dL — ABNORMAL LOW (ref 8.9–10.3)
Chloride: 95 mmol/L — ABNORMAL LOW (ref 98–111)
Creatinine, Ser: 1.27 mg/dL — ABNORMAL HIGH (ref 0.44–1.00)
GFR calc Af Amer: 47 mL/min — ABNORMAL LOW (ref >60)
GFR calc non Af Amer: 40 mL/min — ABNORMAL LOW (ref >60)
Glucose, Bld: 205 mg/dL — ABNORMAL HIGH (ref 70–99)
Potassium: 3.8 mmol/L (ref 3.5–5.1)
Sodium: 138 mmol/L (ref 135–145)
Total Bilirubin: 0.5 mg/dL (ref 0.3–1.2)
Total Protein: 7.1 g/dL (ref 6.5–8.1)

## 2019-12-28 LAB — LIPASE, BLOOD: Lipase: 24 U/L (ref 11–51)

## 2019-12-28 LAB — URINALYSIS, ROUTINE W REFLEX MICROSCOPIC
Bilirubin Urine: NEGATIVE
Glucose, UA: NEGATIVE mg/dL
Hgb urine dipstick: NEGATIVE
Ketones, ur: NEGATIVE mg/dL
Nitrite: NEGATIVE
Protein, ur: 30 mg/dL — AB
Specific Gravity, Urine: 1.006 (ref 1.005–1.030)
pH: 8 (ref 5.0–8.0)

## 2019-12-28 MED ORDER — IOHEXOL 300 MG/ML  SOLN
100.0000 mL | Freq: Once | INTRAMUSCULAR | Status: AC | PRN
  Administered 2019-12-28: 80 mL via INTRAVENOUS

## 2019-12-28 MED ORDER — SODIUM CHLORIDE (PF) 0.9 % IJ SOLN
INTRAMUSCULAR | Status: AC
  Filled 2019-12-28: qty 50

## 2019-12-28 MED ORDER — SUCRALFATE 1 G PO TABS: 1.0000 g | ORAL_TABLET | Freq: Three times a day (TID) | ORAL | 0 refills | Status: DC

## 2019-12-28 MED ORDER — HYDROCODONE-ACETAMINOPHEN 5-325 MG PO TABS: 1.0000 | ORAL_TABLET | ORAL | 0 refills | Status: DC | PRN

## 2019-12-28 MED ORDER — FENTANYL CITRATE (PF) 100 MCG/2ML IJ SOLN
25.0000 ug | Freq: Once | INTRAMUSCULAR | Status: AC
  Administered 2019-12-28: 25 ug via INTRAVENOUS
  Filled 2019-12-28: qty 2

## 2019-12-28 NOTE — Telephone Encounter (Signed)
Spoke with patient of Dr. Stanford Breed. She was changed from Eliquis to [place] on 12/22 (triage note). She was still having recurrent abdominal/GI pains. She went to [facility] ED last night and has duodenitis. She was advised to f/u with GI MD (encouraged she made a call for an appt). She has stopped her Xarelto. She would like to know if she should resume.   Will route to MD & CV[facility] to review/advise

## 2019-12-28 NOTE — Telephone Encounter (Signed)
Patient called with [facility] recommendations. She voiced understanding. She has not yet contacted Dr. Havery Moros - again, encouraged she reach out to his office for GI work up

## 2019-12-28 NOTE — Discharge Instructions (Addendum)
Continue Prilosec twice daily. Take Carafate as directed in place of Mylanta. Norco for pain as needed.   Please follow up with Dr. Havery Moros for further evaluation and management of duodentitis.   Please contact Dr. Stanford Breed regarding continuing your anticoagulation with Xarelto for atrial fibrillation.

## 2019-12-28 NOTE — Telephone Encounter (Signed)
Pt has CHADS2-VASc score of 7, would recommend restarting Xarelto or Eliquis.  No apparent sign on bleeding from GI issues.

## 2019-12-28 NOTE — Telephone Encounter (Signed)
New Message   Patient is calling in to speak with a nurse about having pain in her stomach and back, with sob on exertion (pt. States that it is normal for her), nausea, vomiting, went to ER yesterday and CT showed an ulcer. Patient has been instructed to stop Eliquis and start Xarelto and patient is concerned. States that she has felt awful and has not gotten better. Please give patient call back to advise.

## 2019-12-29 ENCOUNTER — Telehealth: Payer: Self-pay

## 2019-12-29 ENCOUNTER — Encounter: Payer: Self-pay | Admitting: *Deleted

## 2019-12-29 NOTE — Telephone Encounter (Signed)
Patient called back and she has been taking Omeprazole-20mg  BID. I told her Dr. Havery Moros would like her to take the Omeprazole-40mg  BID, and Tye Savoy NP could assess when she sees her tomorrow. She agreed

## 2019-12-29 NOTE — Telephone Encounter (Signed)
Patient already has an office visit with Tye Savoy NP on 12/30/19. Called and left message for patient to please call back, to make sure she is on the Omperazole-40mg  BID

## 2019-12-29 NOTE — Telephone Encounter (Signed)
Thanks Esther 

## 2019-12-30 ENCOUNTER — Ambulatory Visit (INDEPENDENT_AMBULATORY_CARE_PROVIDER_SITE_OTHER): Payer: HMO | Admitting: Nurse Practitioner

## 2019-12-30 ENCOUNTER — Encounter: Payer: Self-pay | Admitting: Nurse Practitioner

## 2019-12-30 VITALS — BP 114/74 | HR 56 | Temp 97.4°F | Ht 63.0 in | Wt 156.2 lb

## 2019-12-30 DIAGNOSIS — R1010 Upper abdominal pain, unspecified: Secondary | ICD-10-CM | POA: Diagnosis not present

## 2019-12-30 DIAGNOSIS — R933 Abnormal findings on diagnostic imaging of other parts of digestive tract: Secondary | ICD-10-CM | POA: Diagnosis not present

## 2019-12-30 LAB — URINE CULTURE: Culture: >=100000 — AB

## 2019-12-30 MED ORDER — SUCRALFATE 1 G PO TABS: 1.0000 g | ORAL_TABLET | Freq: Three times a day (TID) | ORAL | 2 refills | Status: DC

## 2019-12-30 MED ORDER — OMEPRAZOLE 20 MG PO CPDR: 40.0000 mg | DELAYED_RELEASE_CAPSULE | Freq: Two times a day (BID) | ORAL | 2 refills | Status: DC

## 2019-12-30 NOTE — Patient Instructions (Signed)
Continue Carafate four times daily a total of 30 days.  Continue Prilosec (omeprazole) twice daily for a total of 30 days.  After the 30 day period, decrease Prilosec (omeprazole) to once daily dosing and try to discontinue carafate.  If symptoms return or worsen after these measures, call our office. We may need to set you up for endoscopy.  If you are age 78 or older, your body mass index should be between 23-30. Your Body mass index is 27.68 kg/m. If this is out of the aforementioned range listed, please consider follow up with your Primary Care Provider.  If you are age 68 or younger, your body mass index should be between 19-25. Your Body mass index is 27.68 kg/m. If this is out of the aformentioned range listed, please consider follow up with your Primary Care Provider.

## 2019-12-30 NOTE — Progress Notes (Signed)
Chief Complaint:      IMPRESSION and PLAN:    78 year old female with pmh significant for emphysema, fibromyalgia, PAF on Xarelto, CAD / NSTEMI, PVD, HTN, stress induced cardiomyopathy, diastolic dysfunction  1.  Two-week history of epigastric pain radiating up into chest associated with nausea vomiting (clear emesis) and diminished appetite.  Weight stable per Epic. With the exception of diminished appetite her symptoms have all resolved with addition of Carafate and increase in PPI. Labs in ED unremarkable. CT scan unremarkable except for concern for possible wall thickening involving the fourth portion of the duodenum.  --Since her symptoms have resolved except for the poor appetite will hold off on further testing for now.  Unremarkable labs, unremarkable abdominal exam and lack of significant weight loss are reassuring.   --Complete 1 month of Carafate.  Complete 1 month of twice daily PPI then resume daily dosing.  In the interim if symptoms recur patient will call our office to discuss having EGD.    2. Multiple medication allergies  HPI:    Primary GI: Dr. Havery Moros Chief complaint: Upper abdominal pain, nausea, vomiting and weight loss  Patient is a 78 year old female with PMH described as above.  A couple of weeks before Christmas she developed nausea, vomiting (clear emesis), epigastric pain radiating up into her chest and worse with eating.  All this was associated with poor appetite.  Symptoms persisted, she was seen in the ED on 12/27/2019.  Labs remarkable for AKI on CKD.  Her lipase, liver tests and CBC were normal.  CT scan raise concern for possible wall thickening involving fourth portion of duodenum, see below.  She takes Aleve as needed. She was prescribed Carafate before meals and at bedtime.  She is not exactly sure but within the last few days we increased her omeprazole to 40 mg twice daily.  She feels bad and is having some problems with the timeline of  things.  Since starting the Carafate and increasing omeprazole her nausea, vomiting and abdominal pain have all resolved.  She still has a poor appetite.   Data Reviewed:   12.28.20 CTAP w/ contrast IMPRESSION: 1. Questionable wall thickening with adjacent edema about the fourth portion of the duodenum, suggesting duodenitis. 2. Colonic diverticulosis without acute inflammation. 3. 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 Anemia    microcytic   . Arthritis   . Atrial fibrillation ([place])   . Cataract    REMOVED BILATERAL  . Diabetes mellitus ([place])   . Diverticulosis   . Duodenitis   . Fibromyalgia   . Gastric polyp   . Gastroparesis   . Hyperlipidemia   . Hypertension   . Kidney stones   . Melanoma ([place])   . Microcytic anemia   . PMR (polymyalgia rheumatica) (HCC)   . PVD (peripheral vascular disease) ([place])   . Renal cyst   . Tubular adenoma of colon     Patient's surgical history, family medical history, social history, medications and allergies were all reviewed in Epic   Serum creatinine: 1.27 mg/dL (H) 12/27/19 2333 Estimated creatinine  clearance: 34.5 mL/min (A)  Current Outpatient Medications  Medication Sig Dispense Refill  . acetaminophen (TYLENOL) 650 MG CR tablet Take 1,300 mg by mouth every 8 (eight) hours as needed for pain.    Marland Kitchen alendronate (FOSAMAX) 70 MG tablet Take 70 mg by mouth once a week.     . B-D UF III MINI PEN NEEDLES 31G X 5 MM MISC USE WITH LANTUS EVERY NIGHT AT BEDTIME 100 each 3  . gabapentin (NEURONTIN) 100 MG capsule Take 1 capsule (100 mg total)  by mouth 3 (three) times daily. (Patient taking differently: Take 100 mg by mouth at bedtime. ) 90 capsule 3  . glucose blood (ONETOUCH ULTRA) test strip Use to test blood sugars 2 times daily. 100 each 12  . HYDROcodone-acetaminophen (NORCO/VICODIN) 5-325 MG tablet Take 1 tablet by mouth every 4 (four) hours as needed for severe pain. 10 tablet 0  . Insulin Glargine (LANTUS SOLOSTAR) 100 UNIT/ML Solostar Pen ADMINISTER 35 UNITS UNDER THE SKIN DAILY (Patient taking differently: Inject 30 Units into the skin daily. ) 15 mL 2  . lidocaine (LIDODERM) 5 % Place 1 patch onto the skin daily. Remove & Discard patch within 12 hours or as directed by MD 30 patch 2  . metoprolol tartrate (LOPRESSOR) 25 MG tablet Take 25 mg by mouth 2 (two) times daily.    . naproxen sodium (ALEVE) 220 MG tablet Take 220 mg by mouth daily as needed (pain).    . nitroGLYCERIN (NITROSTAT) 0.3 MG SL tablet Place 1 tablet (0.3 mg total) under the tongue every 5 (five) minutes as needed for chest pain. 100 tablet 1  . omeprazole (PRILOSEC) 20 MG capsule Take 40 mg by mouth 2 (two) times daily.     . ondansetron (ZOFRAN) 4 MG tablet Take 4 mg by mouth every 8 (eight) hours as needed for nausea.     . sucralfate (CARAFATE) 1 g tablet Take 1 tablet (1 g total) by mouth 4 (four) times daily -  with meals and at bedtime. 90 tablet 0  . triamcinolone cream (KENALOG) 0.1 % Apply 1 application topically 2 (two) times daily. 30 g 0   No current facility-administered medications for this visit.    Physical Exam:     BP 114/74 (BP Location: Left Arm, Patient Position: Sitting, Cuff Size: Normal)   Pulse (!) 56   Temp (!) 97.4 F (36.3 C)   Ht 5\' 3"  (1.6 m)   Wt 156 lb 4 oz (70.9 kg)   BMI 27.68 kg/m   GENERAL:  Pleasant female in NAD PSYCH: : Cooperative, normal affect EENT:  conjunctiva pink, mucous membranes moist, neck supple without masses CARDIAC:  RRR,  no peripheral edema PULM: Normal respiratory effort, lungs CTA  bilaterally, no wheezing ABDOMEN:  Nondistended, soft, nontender. No obvious masses, no hepatomegaly,  normal bowel sounds SKIN:  turgor, no lesions seen Musculoskeletal:  Normal muscle tone, normal strength NEURO: Alert and oriented x 3, no focal neurologic deficits   Tye Savoy , NP 12/30/2019, 11:26 AM

## 2019-12-31 NOTE — Progress Notes (Signed)
ED Antimicrobial Stewardship Positive Culture Follow Up   Kathleen Griffith is an 79 y.o. female who presented to [facility] on 12/27/2019 with a chief complaint of abdominal pain.  Chief Complaint  Patient presents with  . Abdominal Pain    Recent Results (from the past 720 hour(s))  Urine culture     Status: Abnormal   Collection Time: 12/27/19 11:33 PM   Specimen: Urine, Clean Catch  Result Value Ref Range Status   Specimen Description   Final    URINE, CLEAN CATCH Performed at [facility], [place] [address] [place], [place] [address]    Special Requests   Final    NONE Performed at [facility], [place] [address] [place], [place] [address]    Culture >=100,000 COLONIES/mL ESCHERICHIA COLI (A)  Final   Report Status 12/30/2019 FINAL  Final   Organism ID, Bacteria ESCHERICHIA COLI (A)  Final      Susceptibility   Escherichia coli - MIC*    AMPICILLIN 4 SENSITIVE Sensitive     CEFAZOLIN <=4 SENSITIVE Sensitive     CEFTRIAXONE <=0.25 SENSITIVE Sensitive     CIPROFLOXACIN <=0.25 SENSITIVE Sensitive     GENTAMICIN <=1 SENSITIVE Sensitive     IMIPENEM <=0.25 SENSITIVE Sensitive     NITROFURANTOIN <=16 SENSITIVE Sensitive     TRIMETH/SULFA <=20 SENSITIVE Sensitive     AMPICILLIN/SULBACTAM <=2 SENSITIVE Sensitive     PIP/TAZO <=4 SENSITIVE Sensitive     * >=100,000 COLONIES/mL ESCHERICHIA COLI    Suspects asymptomatic bacteruria. Will not treat at this time   ED Provider: Margarita Mail, PA-C   Dia Sitter P 12/31/2019, 8:57 AM Clinical Pharmacist [phone]

## 2020-01-02 ENCOUNTER — Telehealth: Payer: Self-pay | Admitting: Emergency Medicine

## 2020-01-02 NOTE — Telephone Encounter (Signed)
Post ED Visit - Positive Culture Follow-up  Culture report reviewed by antimicrobial stewardship pharmacist: [place] Team []  Elenor Quinones, Pharm.D. []  Heide Guile, Pharm.D., BCPS AQ-ID []  Parks Neptune, Pharm.D., BCPS []  Alycia Rossetti, Pharm.D., BCPS []  [place], Pharm.D., BCPS, AAHIVP []  Legrand Como, Pharm.D., BCPS, AAHIVP []  Salome Arnt, PharmD, BCPS []  Johnnette Gourd, PharmD, BCPS []  Hughes Better, PharmD, BCPS []  Leeroy Cha, PharmD []  Laqueta Linden, PharmD, BCPS []  Albertina Parr, PharmD  [place] Team []  Leodis Sias, PharmD []  Lindell Spar, PharmD []  Royetta Asal, PharmD []  Graylin Shiver, Rph []  Rema Fendt) Glennon Mac, PharmD []  Arlyn Dunning, PharmD []  Netta Cedars, PharmD [x]  Dia Sitter, PharmD []  Leone Haven, PharmD []  Gretta Arab, PharmD []  Theodis Shove, PharmD []  Peggyann Juba, PharmD []  Reuel Boom, PharmD   Positive urine culture no further patient follow-up is required at this time.  Sandi Raveling Mccormick [place] 01/02/2020, 11:33 AM

## 2020-01-03 ENCOUNTER — Encounter: Payer: Self-pay | Admitting: Nurse Practitioner

## 2020-01-03 ENCOUNTER — Telehealth: Payer: Self-pay | Admitting: Nurse Practitioner

## 2020-01-03 NOTE — Telephone Encounter (Signed)
Patient is asking for a phone call to go over the medication she is taking- (You were working with Kathleen Griffith the day off appt) Patient seen Kathleen Griffith last week and was told to continue Prilosec twice a day for 30 days and then to decrease the Prilosec to once daily. She is taking 40 mg twice a day and is asking if when she decreases should she just do 1 20 mg or if she needs to take 2 for 40 mg. She was confused.

## 2020-01-03 NOTE — Telephone Encounter (Signed)
Explained to patient that she should be on 40 mg omeprazole twice daily x 30 days, then decrease to 40 mg once daily thereafter. Originally we sent (2) 20 mg tablets twice daily as we were under the understanding that her insurance would only cover the 20 mg tablets. Patient indicates that she does not think this was ever the case. In fact, she states, she was on 20 mg at one time, we told her to increase to 40 mg, so she just doubled up on the 20 mg tablets and thinks that is where the (2) 20 mg tablets came into play. I told her that in the future, we can send her 40 mg tablets to which she verbalizes understanding.

## 2020-01-04 ENCOUNTER — Other Ambulatory Visit: Payer: Self-pay

## 2020-01-04 NOTE — Patient Outreach (Signed)
  [facility]) Care Management Chronic Special Needs Program    01/04/2020  Name: Kathleen Griffith, DOB: 1941-06-24  MRN: [id number]   Ms. Kathleen Griffith was enrolled in a chronic special needs plan for Diabetes. Case closed as client has disenrolled from the plan Plan to send case closure letter to client and MD  Peter Garter RN, [facility], [place] Management Coordinator [place] [facility] [phone]

## 2020-01-04 NOTE — Progress Notes (Signed)
Agree with assessment with the following thoughts: Suspected duodenitis noted on CT scan, history of anemia.  Prior EGD only viewed the 2nd portion of the duodenum. I think at some point when she is up for it a diagnostic enteroscopy on the Xarelto is reasonable to ensure we visualize the entire duodenum, make sure no polyp / mass lesion which could look like this.   Nevin Bloodgood can you let the patient know I would like her to follow up and have this enteroscopy done at some point in time in the next 1-2 months when she is feeling up to it. Can be done in the [facility] as long as she does not have any oxygen requirements. Thanks

## 2020-01-18 ENCOUNTER — Telehealth: Payer: Self-pay

## 2020-01-18 NOTE — Telephone Encounter (Signed)
Thanks [employer]. Glad she is feeling better. We can refill her omeprazole, she should continue the current dose. I do think she warrants a follow up endoscopy and I would like to see her back in the office in the next month or so if you can help coordinate.  Otherwise, no I am NOT leaving the practice I have no idea how she got a letter like that. I don't see anything in Epic that says this from [place]. Can she send [place] a copy so we can clarify where she got this? Thanks for letting me know.

## 2020-01-18 NOTE — Telephone Encounter (Signed)
Patient is advised. States she is very happy to have this news. States she threw the letter away. Appointment scheduled for 02/28/20 at 3:40 pm. She depends on her son for transportation.

## 2020-01-18 NOTE — Telephone Encounter (Signed)
Patient calls with an update/progress report of her symptoms. Reports "I am so so much better!" She has only an occasional issue with indigestion "if I eat the wrong thing." She denies any abdominal pain. She is taking Omeprazole 20 mg twice daily. Carafate TID to QID.  She would like to have Carafate on hand for PRN use. She states she will need a refill of Omeprazole at the end of January. Denies GERD. Normal bowel movements. Patient states she has a letter that tells her Dr Havery Moros will be leaving the practice. She states she is very upset about this.

## 2020-01-19 NOTE — Progress Notes (Signed)
Patient contacted and scheduled for follow up.

## 2020-01-24 ENCOUNTER — Other Ambulatory Visit: Payer: Self-pay | Admitting: Cardiology

## 2020-01-24 MED ORDER — METOPROLOL TARTRATE 25 MG PO TABS: 25.0000 mg | ORAL_TABLET | Freq: Two times a day (BID) | ORAL | 3 refills | Status: DC

## 2020-01-24 NOTE — Telephone Encounter (Signed)
Refill [facility] Jasinski  

## 2020-01-24 NOTE — Telephone Encounter (Signed)
*  STAT* If patient is at the pharmacy, call can be transferred to refill team.   1. Which medications need to be refilled? (please list name of each medication and dose if known)  Metoprolol  2. Which pharmacy/location (including street and city if local pharmacy) is medication to be sent to [facility], [place]  3. Do they need a 30 day or 90 day supply? 180 and refills

## 2020-02-28 ENCOUNTER — Ambulatory Visit: Payer: HMO | Admitting: Gastroenterology

## 2020-03-21 ENCOUNTER — Other Ambulatory Visit: Payer: Self-pay | Admitting: Family Medicine

## 2020-03-21 DIAGNOSIS — E119 Type 2 diabetes mellitus without complications: Secondary | ICD-10-CM

## 2020-03-22 NOTE — Telephone Encounter (Signed)
LVM for the pt to call back, seem like the pt est with new PCP at [facility]?

## 2020-03-23 NOTE — Telephone Encounter (Signed)
LVM for the pt to call back.

## 2020-04-11 ENCOUNTER — Other Ambulatory Visit: Payer: Self-pay | Admitting: Nurse Practitioner

## 2020-04-11 ENCOUNTER — Telehealth: Payer: Self-pay | Admitting: Nurse Practitioner

## 2020-04-11 MED ORDER — OMEPRAZOLE 20 MG PO CPDR: 40.0000 mg | DELAYED_RELEASE_CAPSULE | Freq: Two times a day (BID) | ORAL | 1 refills | Status: DC

## 2020-04-11 NOTE — Telephone Encounter (Signed)
I cannot give a 90 day prescription as patient was supposed to see Dr Havery Moros in follow up in February but has not yet seen him. She has actually rescheduled her appointment from tomorrow to June. I will give her enough medication until that appointment only and I can send to her local pharmacy. I have left a message relaying this information to patient.

## 2020-04-11 NOTE — Telephone Encounter (Signed)
Pt reported that she has been taking omeprazole 20 mg bid.  Pt requested a refill for 90 days sent to [facility] mail order pharmacy.

## 2020-04-12 ENCOUNTER — Ambulatory Visit: Payer: Medicare Other | Admitting: Gastroenterology

## 2020-05-01 ENCOUNTER — Other Ambulatory Visit: Payer: Self-pay | Admitting: Family Medicine

## 2020-05-02 ENCOUNTER — Ambulatory Visit: Payer: HMO

## 2020-05-05 ENCOUNTER — Telehealth: Payer: Self-pay | Admitting: Cardiology

## 2020-05-05 NOTE — Telephone Encounter (Signed)
° °  Pt said she's been feeling terrible for a week now. She said she's been in Afib, feeling lightheadedness and when she tries to walk it so easy for her to fall down but not like passing out, her legs is weak  Please advise

## 2020-05-05 NOTE — Telephone Encounter (Signed)
Pt calling today with c/o hypotension and afib. She recently went to her PCP on 5/5 and was dx with CAP. At that time, she was instructed to increase her metoprolol from 25mg  bid to 50mg  bid for HR between 105-120 during the office visit. Pt states her BP dropped to 85/60 last evening and she has not taken her dose of Metoprolol for today. Today she feels poor. She is audibly  SOB at rest over the phone. She has fallen 2x in the last 2 days and has had fluctuating blood sugars in the "50-60's." She states yesterday her blood sugar fell and caused her to fall. She was able to eat a dove chocolate candy to increase her blood sugar. She states she has not had an appetite and has not been drinking enough fluids. Her BP today is 111/59 with HR 95. She does not have a way to take her temperature. She denies chills. She endorses she feels worse today than she did on 5/5. She has only taken 300mg  azithromycin since seeing her PCP.   I called her PCP's office and left a voicemail asking them to contact me back, or contact the pt directly to assess her pneumonia. I advised her to continue to hold her metoprolol while her BP is low. I encouraged her to seek care, at the least, at an urgent care center for possible fluid management and to assess the severity of her pneumonia. She is against this idea, but states "she will call EMS if she gets worse by Sunday." I have heavily encouraged her to either contact her PCP, seek care at urgent care or the ED before Sunday since her symptoms have progressed since 5/5.  She has verbalized understanding of the above.

## 2020-05-06 ENCOUNTER — Encounter (HOSPITAL_COMMUNITY): Payer: Self-pay

## 2020-05-06 ENCOUNTER — Emergency Department (HOSPITAL_COMMUNITY): Payer: Medicare HMO

## 2020-05-06 ENCOUNTER — Inpatient Hospital Stay (HOSPITAL_COMMUNITY): Payer: Medicare HMO

## 2020-05-06 ENCOUNTER — Other Ambulatory Visit: Payer: Self-pay

## 2020-05-06 ENCOUNTER — Inpatient Hospital Stay (HOSPITAL_COMMUNITY)
Admission: EM | Admit: 2020-05-06 | Discharge: 2020-05-17 | DRG: 193 | Disposition: A | Discharge status: Home Health | Payer: Medicare HMO | Attending: Internal Medicine | Admitting: Internal Medicine

## 2020-05-06 DIAGNOSIS — Z66 Do not resuscitate: Secondary | ICD-10-CM | POA: Diagnosis present

## 2020-05-06 DIAGNOSIS — J181 Lobar pneumonia, unspecified organism: Secondary | ICD-10-CM | POA: Diagnosis present

## 2020-05-06 DIAGNOSIS — Z8249 Family history of ischemic heart disease and other diseases of the circulatory system: Secondary | ICD-10-CM | POA: Diagnosis not present

## 2020-05-06 DIAGNOSIS — Z833 Family history of diabetes mellitus: Secondary | ICD-10-CM | POA: Diagnosis not present

## 2020-05-06 DIAGNOSIS — Z7901 Long term (current) use of anticoagulants: Secondary | ICD-10-CM

## 2020-05-06 DIAGNOSIS — M797 Fibromyalgia: Secondary | ICD-10-CM | POA: Diagnosis present

## 2020-05-06 DIAGNOSIS — Z20822 Contact with and (suspected) exposure to covid-19: Secondary | ICD-10-CM | POA: Diagnosis present

## 2020-05-06 DIAGNOSIS — J440 Chronic obstructive pulmonary disease with acute lower respiratory infection: Secondary | ICD-10-CM | POA: Diagnosis present

## 2020-05-06 DIAGNOSIS — I5043 Acute on chronic combined systolic (congestive) and diastolic (congestive) heart failure: Secondary | ICD-10-CM | POA: Diagnosis not present

## 2020-05-06 DIAGNOSIS — R0600 Dyspnea, unspecified: Secondary | ICD-10-CM | POA: Diagnosis not present

## 2020-05-06 DIAGNOSIS — A419 Sepsis, unspecified organism: Secondary | ICD-10-CM

## 2020-05-06 DIAGNOSIS — E46 Unspecified protein-calorie malnutrition: Secondary | ICD-10-CM | POA: Diagnosis present

## 2020-05-06 DIAGNOSIS — E119 Type 2 diabetes mellitus without complications: Secondary | ICD-10-CM

## 2020-05-06 DIAGNOSIS — R252 Cramp and spasm: Secondary | ICD-10-CM | POA: Diagnosis not present

## 2020-05-06 DIAGNOSIS — J301 Allergic rhinitis due to pollen: Secondary | ICD-10-CM | POA: Diagnosis present

## 2020-05-06 DIAGNOSIS — I132 Hypertensive heart and chronic kidney disease with heart failure and with stage 5 chronic kidney disease, or end stage renal disease: Secondary | ICD-10-CM | POA: Diagnosis present

## 2020-05-06 DIAGNOSIS — I10 Essential (primary) hypertension: Secondary | ICD-10-CM | POA: Diagnosis not present

## 2020-05-06 DIAGNOSIS — F17210 Nicotine dependence, cigarettes, uncomplicated: Secondary | ICD-10-CM | POA: Diagnosis present

## 2020-05-06 DIAGNOSIS — K219 Gastro-esophageal reflux disease without esophagitis: Secondary | ICD-10-CM | POA: Diagnosis present

## 2020-05-06 DIAGNOSIS — E118 Type 2 diabetes mellitus with unspecified complications: Secondary | ICD-10-CM

## 2020-05-06 DIAGNOSIS — Z720 Tobacco use: Secondary | ICD-10-CM | POA: Diagnosis present

## 2020-05-06 DIAGNOSIS — I252 Old myocardial infarction: Secondary | ICD-10-CM | POA: Diagnosis not present

## 2020-05-06 DIAGNOSIS — I4891 Unspecified atrial fibrillation: Secondary | ICD-10-CM | POA: Diagnosis not present

## 2020-05-06 DIAGNOSIS — I4819 Other persistent atrial fibrillation: Secondary | ICD-10-CM | POA: Diagnosis not present

## 2020-05-06 DIAGNOSIS — Z85820 Personal history of malignant melanoma of skin: Secondary | ICD-10-CM

## 2020-05-06 DIAGNOSIS — Z87442 Personal history of urinary calculi: Secondary | ICD-10-CM

## 2020-05-06 DIAGNOSIS — Z9181 History of falling: Secondary | ICD-10-CM

## 2020-05-06 DIAGNOSIS — E1143 Type 2 diabetes mellitus with diabetic autonomic (poly)neuropathy: Secondary | ICD-10-CM | POA: Diagnosis present

## 2020-05-06 DIAGNOSIS — Z9119 Patient's noncompliance with other medical treatment and regimen: Secondary | ICD-10-CM | POA: Diagnosis not present

## 2020-05-06 DIAGNOSIS — E1151 Type 2 diabetes mellitus with diabetic peripheral angiopathy without gangrene: Secondary | ICD-10-CM | POA: Diagnosis present

## 2020-05-06 DIAGNOSIS — E782 Mixed hyperlipidemia: Secondary | ICD-10-CM | POA: Diagnosis not present

## 2020-05-06 DIAGNOSIS — Z79899 Other long term (current) drug therapy: Secondary | ICD-10-CM

## 2020-05-06 DIAGNOSIS — Z888 Allergy status to other drugs, medicaments and biological substances status: Secondary | ICD-10-CM | POA: Diagnosis not present

## 2020-05-06 DIAGNOSIS — E7800 Pure hypercholesterolemia, unspecified: Secondary | ICD-10-CM | POA: Diagnosis not present

## 2020-05-06 DIAGNOSIS — E785 Hyperlipidemia, unspecified: Secondary | ICD-10-CM | POA: Diagnosis present

## 2020-05-06 DIAGNOSIS — Z794 Long term (current) use of insulin: Secondary | ICD-10-CM | POA: Diagnosis not present

## 2020-05-06 DIAGNOSIS — Z515 Encounter for palliative care: Secondary | ICD-10-CM | POA: Diagnosis not present

## 2020-05-06 DIAGNOSIS — J189 Pneumonia, unspecified organism: Secondary | ICD-10-CM | POA: Diagnosis present

## 2020-05-06 DIAGNOSIS — I495 Sick sinus syndrome: Secondary | ICD-10-CM | POA: Diagnosis not present

## 2020-05-06 DIAGNOSIS — Z6827 Body mass index (BMI) 27.0-27.9, adult: Secondary | ICD-10-CM

## 2020-05-06 LAB — GLUCOSE, CAPILLARY
Glucose-Capillary: 118 mg/dL — ABNORMAL HIGH (ref 70–99)
Glucose-Capillary: 129 mg/dL — ABNORMAL HIGH (ref 70–99)

## 2020-05-06 LAB — CBC WITH DIFFERENTIAL/PLATELET
Abs Immature Granulocytes: 0.21 10*3/uL — ABNORMAL HIGH (ref 0.00–0.07)
Basophils Absolute: 0.1 10*3/uL (ref 0.0–0.1)
Basophils Relative: 1 %
Eosinophils Absolute: 0.1 10*3/uL (ref 0.0–0.5)
Eosinophils Relative: 2 %
HCT: 41.1 % (ref 36.0–46.0)
Hemoglobin: 12.9 g/dL (ref 12.0–15.0)
Immature Granulocytes: 2 %
Lymphocytes Relative: 18 %
Lymphs Abs: 1.7 10*3/uL (ref 0.7–4.0)
MCH: 28.7 pg (ref 26.0–34.0)
MCHC: 31.4 g/dL (ref 30.0–36.0)
MCV: 91.5 fL (ref 80.0–100.0)
Monocytes Absolute: 0.6 10*3/uL (ref 0.1–1.0)
Monocytes Relative: 6 %
Neutro Abs: 6.7 10*3/uL (ref 1.7–7.7)
Neutrophils Relative %: 71 %
Platelets: 373 10*3/uL (ref 150–400)
RBC: 4.49 MIL/uL (ref 3.87–5.11)
RDW: 14.3 % (ref 11.5–15.5)
WBC: 9.4 10*3/uL (ref 4.0–10.5)
nRBC: 0 % (ref 0.0–0.2)

## 2020-05-06 LAB — COMPREHENSIVE METABOLIC PANEL
ALT: 14 U/L (ref 0–44)
AST: 15 U/L (ref 15–41)
Albumin: 2.2 g/dL — ABNORMAL LOW (ref 3.5–5.0)
Alkaline Phosphatase: 88 U/L (ref 38–126)
Anion gap: 10 (ref 5–15)
BUN: 40 mg/dL — ABNORMAL HIGH (ref 8–23)
CO2: 28 mmol/L (ref 22–32)
Calcium: 8.9 mg/dL (ref 8.9–10.3)
Chloride: 98 mmol/L (ref 98–111)
Creatinine, Ser: 1.16 mg/dL — ABNORMAL HIGH (ref 0.44–1.00)
GFR calc Af Amer: 52 mL/min — ABNORMAL LOW (ref >60)
GFR calc non Af Amer: 45 mL/min — ABNORMAL LOW (ref >60)
Glucose, Bld: 146 mg/dL — ABNORMAL HIGH (ref 70–99)
Potassium: 3.6 mmol/L (ref 3.5–5.1)
Sodium: 136 mmol/L (ref 135–145)
Total Bilirubin: 0.8 mg/dL (ref 0.3–1.2)
Total Protein: 6.6 g/dL (ref 6.5–8.1)

## 2020-05-06 LAB — RESPIRATORY PANEL BY RT PCR (FLU A&B, COVID)
Influenza A by PCR: NEGATIVE
Influenza B by PCR: NEGATIVE
SARS Coronavirus 2 by RT PCR: NEGATIVE

## 2020-05-06 LAB — HIV ANTIBODY (ROUTINE TESTING W REFLEX): HIV Screen 4th Generation wRfx: NONREACTIVE

## 2020-05-06 LAB — HEMOGLOBIN A1C
Hgb A1c MFr Bld: 6.8 % — ABNORMAL HIGH (ref 4.8–5.6)
Mean Plasma Glucose: 148.46 mg/dL

## 2020-05-06 LAB — ECHOCARDIOGRAM COMPLETE

## 2020-05-06 LAB — PROCALCITONIN: Procalcitonin: 0.95 ng/mL

## 2020-05-06 LAB — PROTIME-INR
INR: 1.6 — ABNORMAL HIGH (ref 0.8–1.2)
Prothrombin Time: 18.6 seconds — ABNORMAL HIGH (ref 11.4–15.2)

## 2020-05-06 LAB — APTT: aPTT: 41 seconds — ABNORMAL HIGH (ref 24–36)

## 2020-05-06 LAB — STREP PNEUMONIAE URINARY ANTIGEN: Strep Pneumo Urinary Antigen: NEGATIVE

## 2020-05-06 LAB — BRAIN NATRIURETIC PEPTIDE: B Natriuretic Peptide: 742.9 pg/mL — ABNORMAL HIGH (ref 0.0–100.0)

## 2020-05-06 LAB — LACTIC ACID, PLASMA: Lactic Acid, Venous: 1.1 mmol/L (ref 0.5–1.9)

## 2020-05-06 MED ORDER — ACETAMINOPHEN 650 MG RE SUPP: 650.0000 mg | Freq: Four times a day (QID) | RECTAL | Status: DC | PRN

## 2020-05-06 MED ORDER — RIVAROXABAN 20 MG PO TABS
20.0000 mg | ORAL_TABLET | Freq: Every day | ORAL | Status: DC
  Administered 2020-05-06 – 2020-05-10 (×5): 20 mg via ORAL
  Filled 2020-05-06 (×6): qty 1

## 2020-05-06 MED ORDER — ALBUTEROL SULFATE (2.5 MG/3ML) 0.083% IN NEBU
2.5000 mg | INHALATION_SOLUTION | RESPIRATORY_TRACT | Status: DC | PRN
  Administered 2020-05-09: 2.5 mg via RESPIRATORY_TRACT
  Filled 2020-05-06: qty 3

## 2020-05-06 MED ORDER — SERTRALINE HCL 50 MG PO TABS
50.0000 mg | ORAL_TABLET | Freq: Every day | ORAL | Status: DC
  Administered 2020-05-07 – 2020-05-17 (×11): 50 mg via ORAL
  Filled 2020-05-06 (×12): qty 1

## 2020-05-06 MED ORDER — ACETAMINOPHEN 325 MG PO TABS: 650.0000 mg | ORAL_TABLET | Freq: Four times a day (QID) | ORAL | Status: DC | PRN

## 2020-05-06 MED ORDER — DOCUSATE SODIUM 100 MG PO CAPS
100.0000 mg | ORAL_CAPSULE | Freq: Two times a day (BID) | ORAL | Status: DC
  Administered 2020-05-06 – 2020-05-15 (×6): 100 mg via ORAL
  Filled 2020-05-06 (×16): qty 1

## 2020-05-06 MED ORDER — MORPHINE SULFATE (PF) 2 MG/ML IV SOLN: 2.0000 mg | INTRAVENOUS | Status: DC | PRN

## 2020-05-06 MED ORDER — PRAVASTATIN SODIUM 10 MG PO TABS
20.0000 mg | ORAL_TABLET | Freq: Every day | ORAL | Status: DC
  Administered 2020-05-06 – 2020-05-16 (×11): 20 mg via ORAL
  Filled 2020-05-06 (×11): qty 2

## 2020-05-06 MED ORDER — ONDANSETRON HCL 4 MG/2ML IJ SOLN
4.0000 mg | Freq: Four times a day (QID) | INTRAMUSCULAR | Status: DC | PRN
  Administered 2020-05-07 – 2020-05-16 (×3): 4 mg via INTRAVENOUS
  Filled 2020-05-06 (×3): qty 2

## 2020-05-06 MED ORDER — NICOTINE 14 MG/24HR TD PT24
14.0000 mg | MEDICATED_PATCH | Freq: Every day | TRANSDERMAL | Status: DC
  Filled 2020-05-06 (×8): qty 1

## 2020-05-06 MED ORDER — SODIUM CHLORIDE 0.9 % IV SOLN
2.0000 g | INTRAVENOUS | Status: DC
  Administered 2020-05-06 – 2020-05-10 (×5): 2 g via INTRAVENOUS
  Filled 2020-05-06 (×3): qty 2
  Filled 2020-05-06 (×2): qty 20

## 2020-05-06 MED ORDER — SODIUM CHLORIDE 0.9 % IV SOLN
500.0000 mg | INTRAVENOUS | Status: DC
  Administered 2020-05-06 – 2020-05-13 (×8): 500 mg via INTRAVENOUS
  Filled 2020-05-06 (×9): qty 500

## 2020-05-06 MED ORDER — INSULIN ASPART 100 UNIT/ML ~~LOC~~ SOLN
0.0000 [IU] | Freq: Three times a day (TID) | SUBCUTANEOUS | Status: DC
  Administered 2020-05-07: 2 [IU] via SUBCUTANEOUS
  Administered 2020-05-08: 3 [IU] via SUBCUTANEOUS
  Administered 2020-05-08 – 2020-05-10 (×4): 2 [IU] via SUBCUTANEOUS
  Administered 2020-05-11 – 2020-05-12 (×2): 3 [IU] via SUBCUTANEOUS
  Administered 2020-05-12 (×2): 2 [IU] via SUBCUTANEOUS
  Administered 2020-05-13: 5 [IU] via SUBCUTANEOUS
  Administered 2020-05-13: 3 [IU] via SUBCUTANEOUS
  Administered 2020-05-14: 2 [IU] via SUBCUTANEOUS
  Administered 2020-05-14: 3 [IU] via SUBCUTANEOUS
  Administered 2020-05-15: 2 [IU] via SUBCUTANEOUS
  Administered 2020-05-15: 3 [IU] via SUBCUTANEOUS
  Administered 2020-05-15: 2 [IU] via SUBCUTANEOUS
  Administered 2020-05-16 (×2): 3 [IU] via SUBCUTANEOUS
  Administered 2020-05-17: 2 [IU] via SUBCUTANEOUS
  Administered 2020-05-17: 5 [IU] via SUBCUTANEOUS
  Administered 2020-05-17: 2 [IU] via SUBCUTANEOUS

## 2020-05-06 MED ORDER — BISACODYL 5 MG PO TBEC: 5.0000 mg | DELAYED_RELEASE_TABLET | Freq: Every day | ORAL | Status: DC | PRN

## 2020-05-06 MED ORDER — HYDRALAZINE HCL 20 MG/ML IJ SOLN: 5.0000 mg | INTRAMUSCULAR | Status: DC | PRN

## 2020-05-06 MED ORDER — INSULIN ASPART 100 UNIT/ML ~~LOC~~ SOLN
0.0000 [IU] | Freq: Every day | SUBCUTANEOUS | Status: DC
  Administered 2020-05-11: 2 [IU] via SUBCUTANEOUS

## 2020-05-06 MED ORDER — ZOLPIDEM TARTRATE 5 MG PO TABS
5.0000 mg | ORAL_TABLET | Freq: Every evening | ORAL | Status: DC | PRN
  Filled 2020-05-06: qty 1

## 2020-05-06 MED ORDER — METOPROLOL TARTRATE 25 MG PO TABS
25.0000 mg | ORAL_TABLET | Freq: Two times a day (BID) | ORAL | Status: DC
  Administered 2020-05-06 – 2020-05-07 (×4): 25 mg via ORAL
  Filled 2020-05-06 (×4): qty 1

## 2020-05-06 MED ORDER — IOHEXOL 350 MG/ML SOLN
75.0000 mL | Freq: Once | INTRAVENOUS | Status: AC | PRN
  Administered 2020-05-06: 64 mL via INTRAVENOUS

## 2020-05-06 MED ORDER — PANTOPRAZOLE SODIUM 40 MG PO TBEC
40.0000 mg | DELAYED_RELEASE_TABLET | Freq: Two times a day (BID) | ORAL | Status: DC
  Administered 2020-05-06 – 2020-05-17 (×22): 40 mg via ORAL
  Filled 2020-05-06 (×26): qty 1

## 2020-05-06 MED ORDER — GABAPENTIN 100 MG PO CAPS
100.0000 mg | ORAL_CAPSULE | Freq: Every day | ORAL | Status: DC
  Administered 2020-05-06 – 2020-05-16 (×11): 100 mg via ORAL
  Filled 2020-05-06 (×11): qty 1

## 2020-05-06 MED ORDER — SUCRALFATE 1 G PO TABS
1.0000 g | ORAL_TABLET | Freq: Three times a day (TID) | ORAL | Status: DC
  Administered 2020-05-06 – 2020-05-16 (×28): 1 g via ORAL
  Filled 2020-05-06 (×34): qty 1

## 2020-05-06 MED ORDER — ONDANSETRON HCL 4 MG PO TABS: 4.0000 mg | ORAL_TABLET | Freq: Four times a day (QID) | ORAL | Status: DC | PRN

## 2020-05-06 MED ORDER — SODIUM CHLORIDE 0.9 % IV SOLN: INTRAVENOUS | Status: DC

## 2020-05-06 MED ORDER — HYDROCODONE-ACETAMINOPHEN 5-325 MG PO TABS
1.0000 | ORAL_TABLET | ORAL | Status: DC | PRN
  Administered 2020-05-06 – 2020-05-10 (×5): 2 via ORAL
  Administered 2020-05-12 – 2020-05-17 (×3): 1 via ORAL
  Filled 2020-05-06: qty 1
  Filled 2020-05-06: qty 2
  Filled 2020-05-06: qty 1
  Filled 2020-05-06: qty 2
  Filled 2020-05-06 (×2): qty 1
  Filled 2020-05-06 (×3): qty 2

## 2020-05-06 MED ORDER — POLYETHYLENE GLYCOL 3350 17 G PO PACK: 17.0000 g | PACK | Freq: Every day | ORAL | Status: DC | PRN

## 2020-05-06 NOTE — Progress Notes (Signed)
   05/06/20 1540  Vitals  ECG Heart Rate (!) 124 (pt ambulating)  Cardiac Rhythm Atrial fibrillation  ECG Intervals  QRS interval 0.09  QT interval 0.34  MEWS Score  MEWS Temp 0  MEWS Systolic 0  MEWS Pulse 2  MEWS RR 0  MEWS LOC 0  MEWS Score 2  MEWS Score Color Yellow  Provider Notification  Provider Name/Title Yates  Date Provider Notified 05/06/20  Time Provider Notified 1603  Notification Type Page  Notification Reason Other (Comment) (MEWS)  Response No new orders  Date of Provider Response 05/06/20  Time of Provider Response 1607

## 2020-05-06 NOTE — Progress Notes (Signed)
PT Cancellation Note  Patient Details Name: Kathleen Griffith MRN: [id number] DOB: February 17, 1941   Cancelled Treatment:    Reason Eval/Treat Not Completed: Patient declined, no reason specified. Pt refusing 2/2 fatigue. PT will attempt to follow up as time allows.   Zenaida Niece 05/06/2020, 4:58 PM

## 2020-05-06 NOTE — ED Provider Notes (Signed)
[facility] EMERGENCY DEPARTMENT Provider Note   CSN: [id number] Arrival date & time: 05/06/20  4098     History Chief Complaint  Patient presents with  . Weakness  . Decreased appetite    Kathleen Griffith is a 79 y.o. female.  Patient is a 79 year old female who lives at home with a past medical history of atrial fibrillation, diabetes, hypertension presenting to the emergency department for generalized weakness and feeling unwell.  Patient reports that this began on 29 April.  She was then diagnosed a few days ago with community-acquired pneumonia by her primary care doctor.  Has been taking oral azithromycin.  Reports that she is feeling still very weak to the point where she is having extreme difficulty getting out of bed.  She usually ambulates with a walker.  She denies any chest pain, shortness of breath, vomiting, diarrhea, dysuria.        Past Medical History:  Diagnosis Date  . Anemia    microcytic   . Arthritis   . Atrial fibrillation ([place])   . Cataract    REMOVED BILATERAL  . Diabetes mellitus ([place])   . Diverticulosis   . Duodenitis   . Fibromyalgia   . Gastric polyp   . Gastroparesis   . Hyperlipidemia   . Hypertension   . Kidney stones   . Melanoma ([place])   . Microcytic anemia   . PMR (polymyalgia rheumatica) ([facility])   . PVD (peripheral vascular disease) ([place])   . Renal cyst   . Tubular adenoma of colon     Patient Active Problem List   Diagnosis Date Noted  . 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 Allergic rhinitis due to pollen 10/19/2019  . Pruritus 06/17/2019  . Other fatigue 02/24/2019  . Melanoma ([place]) 12/10/2018  . Atrial fibrillation with RVR ([place]) 09/16/2018  . Depression 12/03/2017  . Normal coronary arteries 12/03/2017  . 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 Polymyalgia rheumatica ([place]) 12/03/2017  . NSTEMI (non-ST elevated myocardial infarction) ([place]) 06/02/2017  . PAF (paroxysmal  atrial fibrillation) ([place]) 03/20/2016  . Insulin dependent diabetes mellitus 03/20/2016  . GERD (gastroesophageal reflux disease) 03/20/2016  . Anemia, iron deficiency   . Angiodysplasia of cecum   . Colon polyp   . 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 Chronic anticoagulation 07/28/2015  . Family history of colon cancer 07/28/2015  . Carotid artery disease ([place]) 04/04/2015  . Tobacco abuse 04/04/2015  . Essential hypertension 04/04/2015  . Hyperlipidemia 04/04/2015    Past Surgical History:  Procedure Laterality Date  . APPENDECTOMY    . BREAST BIOPSY    . CESAREAN SECTION    . COLONOSCOPY    . COLONOSCOPY WITH PROPOFOL N/A 12/26/2015   Procedure: COLONOSCOPY WITH PROPOFOL;  Surgeon: Manus Gunning, MD;  Location: [facility];  Service: Gastroenterology;  Laterality: N/A;  . ESOPHAGOGASTRODUODENOSCOPY (EGD) WITH PROPOFOL N/A 12/26/2015   Procedure: ESOPHAGOGASTRODUODENOSCOPY (EGD) WITH PROPOFOL;  Surgeon: Manus Gunning, MD;  Location: [facility];  Service: Gastroenterology;  Laterality: N/A;  . LEFT HEART CATH AND CORONARY ANGIOGRAPHY N/A 06/03/2017   Procedure: Left Heart Cath and Coronary Angiography;  Surgeon: Martinique, Peter M, MD;  Location: [facility];  Service: Cardiovascular;  Laterality: N/A;  . POLYPECTOMY       OB History   No obstetric history on file.     Family History  Problem Relation Age of Onset  . CAD Father        MI  and CABG  . Diabetes Father   . Heart disease Father   . Colon cancer Mother 104    Social History   Tobacco Use  . Smoking status: Current Every Day Smoker    Packs/day: 1.00    Types: Cigarettes  . Smokeless tobacco: Never Used  Substance Use Topics  . Alcohol use: No    Alcohol/week: 0.0 standard drinks  . Drug use: No    Home Medications Prior to Admission medications   Medication Sig Start Date End Date Taking? Authorizing Provider  acetaminophen (TYLENOL) 650 MG CR tablet Take 1,300 mg by mouth  every 8 (eight) hours as needed for pain.   Yes [provider]  albuterol (VENTOLIN HFA) 108 (90 Base) MCG/ACT inhaler Inhale 2 puffs into the lungs every 4 (four) hours as needed. 03/24/20  Yes [provider]  alendronate (FOSAMAX) 70 MG tablet Take 70 mg by mouth once a week.  11/02/18  Yes [provider]  gabapentin (NEURONTIN) 100 MG capsule Take 1 capsule (100 mg total) by mouth 3 (three) times daily. Patient taking differently: Take 100 mg by mouth at bedtime.  10/19/19  Yes Luetta Nutting, DO  Insulin Glargine (LANTUS SOLOSTAR) 100 UNIT/ML Solostar Pen ADMINISTER 35 UNITS UNDER THE SKIN DAILY Patient taking differently: Inject 28 Units into the skin daily.  10/08/19  Yes Luetta Nutting, DO  metoprolol tartrate (LOPRESSOR) 25 MG tablet Take 1 tablet (25 mg total) by mouth 2 (two) times daily. 01/24/20  Yes Lelon Perla, MD  Multiple Vitamins-Minerals (CENTRUM SILVER 50+WOMEN PO) Take 1 tablet by mouth daily.   Yes [provider]  naproxen sodium (ALEVE) 220 MG tablet Take 220 mg by mouth daily as needed (pain).   Yes [provider]  nitroGLYCERIN (NITROSTAT) 0.3 MG SL tablet Place 1 tablet (0.3 mg total) under the tongue every 5 (five) minutes as needed for chest pain. 06/09/18  Yes Lelon Perla, MD  omeprazole (PRILOSEC) 20 MG capsule Take 2 capsules (40 mg total) by mouth 2 (two) times daily. KEEP 06/01/20 OFFICE VISIT FOR FURTHER REFILLS Patient taking differently: Take 40 mg by mouth 2 (two) times daily.  04/11/20  Yes Armbruster, Carlota Raspberry, MD  pravastatin (PRAVACHOL) 20 MG tablet Take 20 mg by mouth at bedtime. 04/13/20  Yes [provider]  sertraline (ZOLOFT) 100 MG tablet Take 50 mg by mouth daily. 01/25/20  Yes [provider]  sucralfate (CARAFATE) 1 g tablet Take 1 tablet (1 g total) by mouth 4 (four) times daily -  with meals and at bedtime. 12/30/19  Yes Willia Craze, NP  VITAMIN D PO Take 1 capsule by mouth  daily.   Yes [provider]  XARELTO 20 MG TABS tablet Take 20 mg by mouth at bedtime. 04/16/20  Yes [provider]  B-D UF III MINI PEN NEEDLES 31G X 5 MM MISC USE WITH LANTUS EVERY NIGHT AT BEDTIME 12/20/19   Luetta Nutting, DO  glucose blood (ONETOUCH ULTRA) test strip Use to test blood sugars 2 times daily. 08/12/19   Luetta Nutting, DO  HYDROcodone-acetaminophen (NORCO/VICODIN) 5-325 MG tablet Take 1 tablet by mouth every 4 (four) hours as needed for severe pain. Patient not taking: Reported on 05/06/2020 12/28/19   Charlann Lange, PA-C  lidocaine (LIDODERM) 5 % Place 1 patch onto the skin daily. Remove & Discard patch within 12 hours or as directed by MD Patient not taking: Reported on 05/06/2020 06/16/19   Luetta Nutting, DO  triamcinolone cream (KENALOG) 0.1 % Apply 1 application topically 2 (two) times daily. Patient not taking: Reported on 05/06/2020 06/16/19   Luetta Nutting, DO    Allergies    Anoro ellipta [umeclidinium-vilanterol], Losartan, Norvasc [amlodipine besylate], Pregabalin, Bupropion, Metoclopramide, Eliquis [apixaban], Lisinopril, Metformin, and Metformin and related  Review of Systems   Review of Systems  Constitutional: Positive for appetite change and fatigue. Negative for chills and fever.  HENT: Negative for ear pain and sore throat.   Eyes: Negative for pain and visual disturbance.  Respiratory: Positive for cough. Negative for shortness of breath.   Cardiovascular: Negative for chest pain and palpitations.  Gastrointestinal: Positive for nausea. Negative for abdominal pain and vomiting.  Genitourinary: Negative for dysuria and hematuria.  Musculoskeletal: Positive for gait problem. Negative for arthralgias and back pain.  Skin: Negative for color change and rash.  Neurological: Positive for weakness. Negative for seizures and syncope.  All other systems reviewed and are negative.   Physical Exam Updated Vital Signs BP (!) 141/64   Pulse 82    Resp (!) 21   SpO2 97%   Physical Exam Vitals and nursing note reviewed.  Constitutional:      General: She is not in acute distress.    Appearance: Normal appearance. She is ill-appearing. She is not toxic-appearing or diaphoretic.  HENT:     Head: Normocephalic.     Mouth/Throat:     Mouth: Mucous membranes are moist.  Eyes:     Conjunctiva/sclera: Conjunctivae normal.  Cardiovascular:     Rate and Rhythm: Normal rate. Rhythm irregular.  Pulmonary:     Effort: Pulmonary effort is normal.     Breath sounds: Examination of the right-middle field reveals rales. Rales present. No decreased breath sounds, wheezing or rhonchi.  Abdominal:     General: Abdomen is flat.     Palpations: Abdomen is soft.  Musculoskeletal:     Right lower leg: Edema present.     Left lower leg: Edema present.  Skin:    General: Skin is dry.  Neurological:     Mental Status: She is alert.  Psychiatric:        Mood and Affect: Mood normal.     ED Results / Procedures / Treatments   Labs (all labs ordered are listed, but only abnormal results are displayed) Labs Reviewed  CBC WITH DIFFERENTIAL/PLATELET - Abnormal; Notable for the following components:      Result Value   Abs Immature Granulocytes 0.21 (*)    All other components within normal limits  COMPREHENSIVE METABOLIC PANEL - Abnormal; Notable for the following components:   Glucose, Bld 146 (*)    BUN 40 (*)    Creatinine, Ser 1.16 (*)    Albumin 2.2 (*)    GFR calc non Af Amer 45 (*)    GFR calc Af Amer 52 (*)    All other components within normal limits  PROTIME-INR - Abnormal; Notable for the following components:   Prothrombin Time 18.6 (*)    INR 1.6 (*)    All other components within normal limits  APTT - Abnormal; Notable for the following components:   aPTT 41 (*)    All other components within normal limits  BRAIN NATRIURETIC PEPTIDE - Abnormal; Notable for the following components:   B Natriuretic Peptide 742.9 (*)     All other components within normal limits  CULTURE, BLOOD (ROUTINE X 2)  CULTURE, BLOOD (ROUTINE X 2)  RESPIRATORY PANEL BY RT PCR (FLU  A&B, COVID)  LACTIC ACID, PLASMA  LACTIC ACID, PLASMA  URINALYSIS, ROUTINE W REFLEX MICROSCOPIC    EKG EKG Interpretation  Date/Time:  Saturday May 06 2020 08:48:00 EDT Ventricular Rate:  100 PR Interval:    QRS Duration: 90 QT Interval:  386 QTC Calculation: 457 R Axis:   71 Text Interpretation: Atrial fibrillation Consider anterior infarct Minimal ST depression Baseline wander in lead(s) I III Confirmed by Malvin Johns [phone]) on 05/06/2020 9:46:23 AM   Radiology DG Chest Port 1 View  Result Date: 05/06/2020 CLINICAL DATA:  Pneumonia EXAM: PORTABLE CHEST 1 VIEW COMPARISON:  Kathleen 27, 2019 FINDINGS: There is airspace consolidation in the right upper lobe. There is a calcified granuloma in the left lower lung region. Lungs otherwise are clear. Heart is upper normal in size with pulmonary vascularity normal. No adenopathy. There is aortic atherosclerosis. No bone lesions. IMPRESSION: Airspace consolidation right upper lobe consistent with pneumonia. Lungs otherwise clear except for small left lower lobe region calcified granuloma. Heart upper normal in size. No demonstrable adenopathy by radiography. Aortic Atherosclerosis (ICD10-I70.0). Electronically Signed   By: Lowella Grip III M.D.   On: 05/06/2020 10:49    Procedures Procedures (including critical care time)  Medications Ordered in ED Medications  cefTRIAXone (ROCEPHIN) 2 g in sodium chloride 0.9 % 100 mL IVPB ( Intravenous Restarted 05/06/20 1011)  azithromycin (ZITHROMAX) 500 mg in sodium chloride 0.9 % 250 mL IVPB (500 mg Intravenous New Bag/Given 05/06/20 3734)    ED Course  I have reviewed the triage vital signs and the nursing notes.  Pertinent labs & imaging results that were available during my care of the patient were reviewed by me and considered in my medical decision  making (see chart for details).  Clinical Course as of May 06 1136  Sat May 06, 2020  1136 Patient presenting with 1-2 weeks of progessive fatigue, weakness and cough. Being treated for CAP outpatient by PMD on zithromax but still declining. Workup showing pneumonia R upper lung. She is not hypoxic, wbc normal and CMP at baseline. Patient lives alone and will need admission for CAP failing outpatient ABX. Patient agreeable to plan.    [KM]    Clinical Course User Index [KM] Kristine Royal   MDM Rules/Calculators/A&P                       The patient appears reasonably stabilized for admission considering the current resources, flow, and capabilities available in the ED at this time, and I doubt any other [facility] requiring further screening and/or treatment in the ED prior to admission.  Final Clinical Impression(s) / ED Diagnoses Final diagnoses:  Sepsis ([place])  Community acquired pneumonia of right upper lobe of lung    Rx / DC Orders ED Discharge Orders    None       Kristine Royal 05/07/20 2876    Malvin Johns, MD 05/07/20 [phone]

## 2020-05-06 NOTE — H&P (Signed)
History and Physical    Kathleen Griffith LOV:[id number] DOB: [birth year] DOA: 05/06/2020  PCP:  Bernerd Limbo Consultants: Stanford Breed - cardiology; Armbruster - GI; Hawks - rheumatology Patient coming from:  Home - lives alone; NOK: Kathleen Griffith, [phone]  Chief Complaint: Refractory PNA  HPI: Kathleen Griffith is a 79 y.o. female with medical history significant of PVD: PMR; HTN; HLD; DM with gastroparesis; fibromyalgia; and afib presenting with refractory PNA.  She reports that she was doing fine.  She flew to [place] to visit a friend.  The day after returning home, she was in afib (hasn't been in that in over a year).  She fell the night before she came home for the first time in over a year.  Things just went from bad to worse.  She fell at home 5 days later.  She has no energy, takes everything she can do to do anything.  She gets up on the side of the chair or bed and has to sit there for an hour or more to get the energy to get up.  She can't fix meals.  She drinks water.  Yesterday, she ate one piece of toast all day.  Not really SOB, only with conversation or moving around.  +cough, productive of greenish sputum.  No fever.  She saw Dr. Coletta Memos on 5/5 and he sent her for CXR and ankle xray (from fall); he told her he thought she had atypical PNA and called in azithromycin.  She has had minimal improvement despite this being the third day.      ED Course:  CAP - worsening cough, weakness despite Azithromycin.  Unable to get out of bed today. No evidence of sepsis.  Review of Systems: As per HPI; otherwise review of systems reviewed and negative.   Ambulatory Status:  Ambulates with a cane  COVID Vaccine Status:  Complete  Past Medical History:  Diagnosis Date  . Anemia    microcytic   . Arthritis   . Atrial fibrillation ([place])   . Cataract    REMOVED BILATERAL  . Diabetes mellitus ([place])   . Diverticulosis   . Duodenitis   . Fibromyalgia   . Gastric polyp   .  Gastroparesis   . Hyperlipidemia   . Hypertension   . Kidney stones   . Melanoma ([place])   . Microcytic anemia   . PMR (polymyalgia rheumatica) ([facility])   . PVD (peripheral vascular disease) ([place])   . Renal cyst   . Tubular adenoma of colon     Past Surgical History:  Procedure Laterality Date  . APPENDECTOMY    . BREAST BIOPSY    . CESAREAN SECTION    . COLONOSCOPY    . COLONOSCOPY WITH PROPOFOL N/A 12/26/2015   Procedure: COLONOSCOPY WITH PROPOFOL;  Surgeon: Manus Gunning, MD;  Location: [facility];  Service: Gastroenterology;  Laterality: N/A;  . ESOPHAGOGASTRODUODENOSCOPY (EGD) WITH PROPOFOL N/A 12/26/2015   Procedure: ESOPHAGOGASTRODUODENOSCOPY (EGD) WITH PROPOFOL;  Surgeon: Manus Gunning, MD;  Location: [facility];  Service: Gastroenterology;  Laterality: N/A;  . LEFT HEART CATH AND CORONARY ANGIOGRAPHY N/A 06/03/2017   Procedure: Left Heart Cath and Coronary Angiography;  Surgeon: Martinique, Peter M, MD;  Location: [facility];  Service: Cardiovascular;  Laterality: N/A;  . POLYPECTOMY      Social History   Socioeconomic History  . Marital status: Widowed    Spouse name: Not on file  . Number of children: 1  . Years of education: Not  on file  . Highest education level: Not on file  Occupational History  . Not on file  Tobacco Use  . Smoking status: Current Every Day Smoker    Packs/day: 1.00    Years: 60.00    Pack years: 60.00    Types: Cigarettes  . Smokeless tobacco: Never Used  Substance and Sexual Activity  . Alcohol use: No    Alcohol/week: 0.0 standard drinks  . Drug use: No  . Sexual activity: Not on file  Other Topics Concern  . Not on file  Social History Narrative  . Not on file   Social Determinants of Health   Financial Resource Strain:   . Difficulty of Paying Living Expenses:   Food Insecurity:   . Worried About Charity fundraiser in the Last Year:   . Arboriculturist in the Last Year:   Transportation Needs:   .  Film/video editor (Medical):   Marland Kitchen Lack of Transportation (Non-Medical):   Physical Activity:   . Days of Exercise per Week:   . Minutes of Exercise per Session:   Stress:   . Feeling of Stress :   Social Connections:   . Frequency of Communication with Friends and Family:   . Frequency of Social Gatherings with Friends and Family:   . Attends Religious Services:   . Active Member of Clubs or Organizations:   . Attends Archivist Meetings:   Marland Kitchen Marital Status:   Intimate Partner Violence:   . Fear of Current or Ex-Partner:   . Emotionally Abused:   Marland Kitchen Physically Abused:   . Sexually Abused:     Allergies  Allergen Reactions  . Anoro Ellipta [Umeclidinium-Vilanterol] Palpitations  . Losartan Shortness Of Breath    wheezing wheezing  . Norvasc [Amlodipine Besylate] Swelling  . Pregabalin Swelling    TO LOWER EXTREMTIES TO LOWER EXTREMTIES  . Bupropion     Other reaction(s): Other Sucidal thoughts  . Metoclopramide Other (See Comments)    Tardive dyskinesia, tremors  . Eliquis [Apixaban] Nausea And Vomiting    Severe stomach pain  . Lisinopril Cough  . Metformin Diarrhea  . Metformin And Related Diarrhea    Family History  Problem Relation Age of Onset  . CAD Father        MI and CABG  . Diabetes Father   . Heart disease Father   . Colon cancer Mother 39    Prior to Admission medications   Medication Sig Start Date End Date Taking? Authorizing Provider  acetaminophen (TYLENOL) 650 MG CR tablet Take 1,300 mg by mouth every 8 (eight) hours as needed for pain.   Yes [provider]  albuterol (VENTOLIN HFA) 108 (90 Base) MCG/ACT inhaler Inhale 2 puffs into the lungs every 4 (four) hours as needed. 03/24/20  Yes [provider]  alendronate (FOSAMAX) 70 MG tablet Take 70 mg by mouth once a week.  11/02/18  Yes [provider]  gabapentin (NEURONTIN) 100 MG capsule Take 1 capsule (100 mg total) by mouth 3 (three) times  daily. Patient taking differently: Take 100 mg by mouth at bedtime.  10/19/19  Yes Luetta Nutting, DO  Insulin Glargine (LANTUS SOLOSTAR) 100 UNIT/ML Solostar Pen ADMINISTER 35 UNITS UNDER THE SKIN DAILY Patient taking differently: Inject 28 Units into the skin daily.  10/08/19  Yes Luetta Nutting, DO  metoprolol tartrate (LOPRESSOR) 25 MG tablet Take 1 tablet (25 mg total) by mouth 2 (two) times daily. 01/24/20  Yes  Lelon Perla, MD  Multiple Vitamins-Minerals (CENTRUM SILVER 50+WOMEN PO) Take 1 tablet by mouth daily.   Yes [provider]  naproxen sodium (ALEVE) 220 MG tablet Take 220 mg by mouth daily as needed (pain).   Yes [provider]  nitroGLYCERIN (NITROSTAT) 0.3 MG SL tablet Place 1 tablet (0.3 mg total) under the tongue every 5 (five) minutes as needed for chest pain. 06/09/18  Yes Lelon Perla, MD  omeprazole (PRILOSEC) 20 MG capsule Take 2 capsules (40 mg total) by mouth 2 (two) times daily. KEEP 06/01/20 OFFICE VISIT FOR FURTHER REFILLS Patient taking differently: Take 40 mg by mouth 2 (two) times daily.  04/11/20  Yes Armbruster, Carlota Raspberry, MD  pravastatin (PRAVACHOL) 20 MG tablet Take 20 mg by mouth at bedtime. 04/13/20  Yes [provider]  sertraline (ZOLOFT) 100 MG tablet Take 50 mg by mouth daily. 01/25/20  Yes [provider]  sucralfate (CARAFATE) 1 g tablet Take 1 tablet (1 g total) by mouth 4 (four) times daily -  with meals and at bedtime. 12/30/19  Yes Willia Craze, NP  VITAMIN D PO Take 1 capsule by mouth daily.   Yes [provider]  XARELTO 20 MG TABS tablet Take 20 mg by mouth at bedtime. 04/16/20  Yes [provider]  B-D UF III MINI PEN NEEDLES 31G X 5 MM MISC USE WITH LANTUS EVERY NIGHT AT BEDTIME 12/20/19   Luetta Nutting, DO  glucose blood (ONETOUCH ULTRA) test strip Use to test blood sugars 2 times daily. 08/12/19   Luetta Nutting, DO  HYDROcodone-acetaminophen (NORCO/VICODIN) 5-325 MG tablet Take 1  tablet by mouth every 4 (four) hours as needed for severe pain. Patient not taking: Reported on 05/06/2020 12/28/19   Charlann Lange, PA-C  lidocaine (LIDODERM) 5 % Place 1 patch onto the skin daily. Remove & Discard patch within 12 hours or as directed by MD Patient not taking: Reported on 05/06/2020 06/16/19   Luetta Nutting, DO  triamcinolone cream (KENALOG) 0.1 % Apply 1 application topically 2 (two) times daily. Patient not taking: Reported on 05/06/2020 06/16/19   Luetta Nutting, DO    Physical Exam: Vitals:   05/06/20 1215 05/06/20 1230 05/06/20 1245 05/06/20 1300  BP: 133/73 (!) 142/66 139/78 126/88  Pulse: 89  61 63  Resp: 15 15 20 20   SpO2: 100%  99% 99%     . General:  Appears calm and comfortable and is NAD; appears fatigued . Eyes:  PERRL, EOMI, normal lids, iris . ENT:  grossly normal hearing, lips & tongue, mmm; appropriate dentition . Neck:  no LAD, masses or thyromegaly . Cardiovascular:  Irregularly irregular, no m/r/g. 2-3+ LE edema. HR in the 90s at rest but jumped to 135 just with the exertion of sitting up to allow posterior lung auscultation. Marland Kitchen Respiratory:   CTA bilaterally with no wheezes/rales/rhonchi.  Normal respiratory effort. . Abdomen:  soft, NT, ND, NABS . Skin:  no rash or induration seen on limited exam . Musculoskeletal:  grossly normal tone BUE/BLE, good ROM, no bony abnormality; B LE with superficial TTP . Psychiatric:  blunted mood and affect, speech fluent and appropriate, AOx3 . Neurologic:  CN 2-12 grossly intact, moves all extremities in coordinated fashion    Radiological Exams on Admission: DG Chest Port 1 View  Result Date: 05/06/2020 CLINICAL DATA:  Pneumonia EXAM: PORTABLE CHEST 1 VIEW COMPARISON:  September 25, 2018 FINDINGS: There is airspace consolidation in the right upper lobe. There is a  calcified granuloma in the left lower lung region. Lungs otherwise are clear. Heart is upper normal in size with pulmonary vascularity normal. No  adenopathy. There is aortic atherosclerosis. No bone lesions. IMPRESSION: Airspace consolidation right upper lobe consistent with pneumonia. Lungs otherwise clear except for small left lower lobe region calcified granuloma. Heart upper normal in size. No demonstrable adenopathy by radiography. Aortic Atherosclerosis (ICD10-I70.0). Electronically Signed   By: Lowella Grip III M.D.   On: 05/06/2020 10:49    EKG: Independently reviewed.  Afib with rate 100; nonspecific ST changes with no evidence of acute ischemia   Labs on Admission: I have personally reviewed the available labs and imaging studies at the time of the admission.  Pertinent labs:   Glucose 146 BUN 40/Creatinine 1.16/GFR 52 - stable BNP 742.9 Lactate 1.1 Normal CBC INR 1.6 Respiratory panel PCR negative   Assessment/Plan Principal Problem:   CAP (community acquired pneumonia) Active Problems:   Tobacco abuse   Essential hypertension   Hyperlipidemia   Atrial fibrillation with RVR (HCC)   CAP -Given productive cough and infiltrate in right upper lobe on chest x-ray, most likely community-acquired pneumonia.  -She was already treated with Azithromycin monotherapy x 2-3 days without improvement -Symptoms are not entirely c/w CAP, as fatigue is her most concerning symptom - Other etiologies include CHF exacerbation (LE edema, increased BNP - see below); PE (recent plane trip, possible AC non-compliance given pattern with other medications); other lung pathology. -Influenza negative. -COVID-19 negative. -Will check Echo and CTA -Will order lower respiratory tract procalcitonin level.  Antibiotics may not be indicated for PCT <0.1 and probably should not be used for < 0.25.  >0.5 indicates infection and >>0.5 indicates more serious disease.  As the procalcitonin level normalizes, it will be reasonable to consider de-escalation of antibiotic coverage.  The sensitivity of procalcitonin is variable and should not be used  alone to guide treatment. -CURB-65 score is 2 - will admit the patient to Med Surg. -Pneumonia Severity Index (PSI) is Class 3, 1% mortality. -Will start Azithromycin 500 mg daily AND Rocephin due to no risk factors for MDR cause. -Additional complicating factors include: failure of outpatient antibiotics -NS @ 75cc/hr (stop if Echo is abnormal) -Fever control -Repeat CBC in am -Will add albuterol PRN  DM -A1c a year ago was 5.8; will repeat -Appears to have erratic use of Lantus (last used sometime last week) and yet has reasonable control currently -will hold basal insulin for now -Will cover with moderate-scale SSI -Diabetes coordinator consulted  HTN -Continue Lopressor  HLD -Continue Pravachol  Afib  -Rate controlled at rest but clearly uncontrolled with minimal exertion; this may be related to recent noncompliance with Lopressor (will resume tonight) -She has h/o Takotsubo cardiomyopathy in 2018 - EF 25% with grade 2 diastolic dysfunction in June and improved to 55-60% with grade 1 DD in August; will recheck echo today in case LE edema and discomfort in conjunction with elevated BNP is related to CHF despite no apparent edema on echo -Continue Xarelto  Tobacco dependence -Encourage cessation.   -This was discussed with the patient and should be reviewed on an ongoing basis.   -Patch ordered  -Continue prn Albuterol    Note: This patient has been tested and is negative for the novel coronavirus COVID-19.  DVT prophylaxis: Xarelto Code Status:  DNR - confirmed with patient Family Communication: None present; she declined having me speak with her son at the time of admission Disposition Plan:  The patient  is from: home  Anticipated d/c is to: home without [facility] services  Anticipated d/c date will depend on clinical response to treatment, but likely 2-3 days  Patient is currently: acutely ill Consults called: PT/OT  Admission status:  Admit - It is my clinical opinion  that admission to [facility] is reasonable and necessary because of the expectation that this patient will require hospital care that crosses at least 2 midnights to treat this condition based on the medical complexity of the problems presented.  Given the aforementioned information, the predictability of an adverse outcome is felt to be significant.    Karmen Bongo MD [facility]   How to contact the [facility] Attending or Consulting provider [place] or covering provider during after hours [place], for this patient?  1. Check the care team in [facility] and look for a) attending/consulting [facility] provider listed and b) the [facility] team listed 2. Log into [email or web address] and use [place]'s universal password to access. If you do not have the password, please contact the hospital operator. 3. Locate the [facility] provider you are looking for under [facility] and page to a number that you can be directly reached. 4. If you still have difficulty reaching the provider, please page the [facility] (Director on Call) for the Hospitalists listed on amion for assistance.   05/06/2020, 1:24 PM

## 2020-05-06 NOTE — Progress Notes (Signed)
  Echocardiogram 2D Echocardiogram has been performed.  Kathleen Griffith 05/06/2020, 4:28 PM

## 2020-05-06 NOTE — ED Triage Notes (Signed)
Pt arrived to ED via [facility], 2 days ago she was diagnosed with pneumonia, since then her symptoms have persisted. As well as, increased weakness & decreased appetite. Pt said she feels miserable. EMS reports she has A-fib on the monitor & was borderline tachy whjle in route to ED. She was reported to have no signs of resp. Distress & they were unable to gain a spO2 reading. Pt reports no diff. Breathing.

## 2020-05-07 DIAGNOSIS — Z66 Do not resuscitate: Secondary | ICD-10-CM

## 2020-05-07 LAB — CBC WITH DIFFERENTIAL/PLATELET
Abs Immature Granulocytes: 0.08 10*3/uL — ABNORMAL HIGH (ref 0.00–0.07)
Basophils Absolute: 0.1 10*3/uL (ref 0.0–0.1)
Basophils Relative: 1 %
Eosinophils Absolute: 0.2 10*3/uL (ref 0.0–0.5)
Eosinophils Relative: 3 %
HCT: 36.9 % (ref 36.0–46.0)
Hemoglobin: 11.7 g/dL — ABNORMAL LOW (ref 12.0–15.0)
Immature Granulocytes: 1 %
Lymphocytes Relative: 16 %
Lymphs Abs: 1.3 10*3/uL (ref 0.7–4.0)
MCH: 29 pg (ref 26.0–34.0)
MCHC: 31.7 g/dL (ref 30.0–36.0)
MCV: 91.3 fL (ref 80.0–100.0)
Monocytes Absolute: 0.5 10*3/uL (ref 0.1–1.0)
Monocytes Relative: 6 %
Neutro Abs: 6.2 10*3/uL (ref 1.7–7.7)
Neutrophils Relative %: 73 %
Platelets: 324 10*3/uL (ref 150–400)
RBC: 4.04 MIL/uL (ref 3.87–5.11)
RDW: 14.4 % (ref 11.5–15.5)
WBC: 8.4 10*3/uL (ref 4.0–10.5)
nRBC: 0 % (ref 0.0–0.2)

## 2020-05-07 LAB — GLUCOSE, CAPILLARY
Glucose-Capillary: 88 mg/dL (ref 70–99)
Glucose-Capillary: 135 mg/dL — ABNORMAL HIGH (ref 70–99)
Glucose-Capillary: 121 mg/dL — ABNORMAL HIGH (ref 70–99)
Glucose-Capillary: 127 mg/dL — ABNORMAL HIGH (ref 70–99)

## 2020-05-07 LAB — BASIC METABOLIC PANEL
Anion gap: 9 (ref 5–15)
BUN: 23 mg/dL (ref 8–23)
CO2: 24 mmol/L (ref 22–32)
Calcium: 8.3 mg/dL — ABNORMAL LOW (ref 8.9–10.3)
Chloride: 105 mmol/L (ref 98–111)
Creatinine, Ser: 1.01 mg/dL — ABNORMAL HIGH (ref 0.44–1.00)
GFR calc Af Amer: >60 mL/min (ref >60)
GFR calc non Af Amer: 53 mL/min — ABNORMAL LOW (ref >60)
Glucose, Bld: 93 mg/dL (ref 70–99)
Potassium: 3.3 mmol/L — ABNORMAL LOW (ref 3.5–5.1)
Sodium: 138 mmol/L (ref 135–145)

## 2020-05-07 MED ORDER — POTASSIUM CHLORIDE CRYS ER 20 MEQ PO TBCR
40.0000 meq | EXTENDED_RELEASE_TABLET | Freq: Once | ORAL | Status: AC
  Administered 2020-05-07: 40 meq via ORAL
  Filled 2020-05-07: qty 2

## 2020-05-07 MED ORDER — POTASSIUM CHLORIDE 10 MEQ/100ML IV SOLN
10.0000 meq | INTRAVENOUS | Status: AC
  Administered 2020-05-07: 10 meq via INTRAVENOUS
  Filled 2020-05-07 (×2): qty 100

## 2020-05-07 NOTE — Progress Notes (Signed)
Progress Note    [name]  FGH:[id number] DOB: [birth year]  DOA: 05/06/2020 PCP: Luetta Nutting, DO    Brief Narrative:     Medical records reviewed and are as summarized below:  Kathleen Griffith is an 79 y.o. female with medical history significant of PVD: PMR; HTN; HLD; DM with gastroparesis; fibromyalgia; and afib presenting with refractory PNA.  She reports that she was doing fine.  She flew to [place] to visit a friend.  The day after returning home, she was in afib (hasn't been in that in over a year).  She fell the night before she came home for the first time in over a year.  Things just went from bad to worse.  She fell at home 5 days later.  She has no energy, takes everything she can do to do anything.  She gets up on the side of the chair or bed and has to sit there for an hour or more to get the energy to get up.  She can't fix meals.  She drinks water.  Yesterday, she ate one piece of toast all day.  Not really SOB, only with conversation or moving around.  +cough, productive of greenish sputum.  No fever.  She saw Dr. Coletta Memos on 5/5 and he sent her for CXR and ankle xray (from fall); he told her he thought she had atypical PNA and called in azithromycin.  She has had minimal improvement despite this being the third day.    Assessment/Plan:   Principal Problem:   CAP (community acquired pneumonia) Active Problems:   Tobacco abuse   Essential hypertension   Hyperlipidemia   Atrial fibrillation with RVR (HCC)   DNR (do not resuscitate)   CAP -Given productive cough and infiltrate in right upper lobe on chest x-ray, most likely community-acquired pneumonia. -She was already treated with Azithromycin monotherapy x 2-3 days without improvement -Influenza negative. -COVID-19 negative. -CTA: Extensive right upper lobe airspace opacities suggesting pneumonia. Followup PA and lateral chest X-ray is recommended in 3-4 weeks following trial of antibiotic therapy to ensure  resolution and exclude underlying malignancy. (patient informed of need for repeat x ray on 5/9) -pro calcitonin elevated -Will start Azithromycin 500 mg daily AND Rocephin due to no risk factors for MDR cause. -Will add albuterol PRN  DM- type 2 -A1c 6.8 -Appears to have erratic use of Lantus (last used sometime last week) a -[facility] -Diabetes coordinator consulted  HTN -Continue Lopressor  HLD -Continue Pravachol  Afib - parox -upon admission rate controlled at rest but clearly uncontrolled with minimal exertion; this may be related to recent noncompliance with Lopressor  -She has h/o Takotsubo cardiomyopathy in 2018 - EF 25% with grade 2 diastolic dysfunction in June and improved to 55-60% with grade 1 DD in August-- echo this admission is similar -Continue Xarelto  Tobacco dependence -Encourage cessation.   -This was discussed with the patient and should be reviewed on an ongoing basis.   -Patch ordered  -Continue prn Albuterol    Family Communication/Anticipated D/C date and plan/Code Status   DVT prophylaxis: Lovenox ordered. Code Status: DNR Disposition Plan: Status is: Inpatient  Remains inpatient appropriate because:Inpatient level of care appropriate due to severity of illness   Dispo: The patient is from: Home              Anticipated d/c is to: Home              Anticipated d/c date is: 2 days  Patient currently is not medically stable to d/c.    Medical Consultants:    None.   Subjective:   Only eating a few bites but she states this is better than yesterday  Objective:    Vitals:   05/06/20 1947 05/06/20 2340 05/07/20 0340 05/07/20 0739  BP: 129/79 (!) 114/56 137/63 (!) 130/54  Pulse: (!) 101 88 94 95  Resp: 18 18 18 16   Temp:  97.7 F (36.5 C) 97.9 F (36.6 C) 97.6 F (36.4 C)  TempSrc:  Oral Oral Oral  SpO2: 98% 96% 94% 98%  Weight:      Height:        Intake/Output Summary (Last 24 hours) at 05/07/2020 0945 Last  data filed at 05/07/2020 0600 Gross per 24 hour  Intake 549.23 ml  Output 500 ml  Net 49.23 ml   Filed Weights   05/06/20 1539  Weight: 71.3 kg    Exam:  General: Appearance:     Well developed, well nourished female in no acute distress     Lungs:     Coarse breath sounds RUL, respirations unlabored  Heart:    Normal heart rate. Irregularly irregular rhythm. No murmurs, rubs, or gallops.   MS:   All extremities are intact.   Neurologic:   Awake, alert, oriented x 3. No apparent focal neurological           defect.     Data Reviewed:   I have personally reviewed following labs and imaging studies:  Labs: Labs show the following:   Basic Metabolic Panel: Recent Labs  Lab 05/06/20 0912 05/07/20 0556  NA 136 138  K 3.6 3.3*  CL 98 105  CO2 28 24  GLUCOSE 146* 93  BUN 40* 23  CREATININE 1.16* 1.01*  CALCIUM 8.9 8.3*   GFR Estimated Creatinine Clearance: 42.8 mL/min (A) (by C-G formula based on SCr of 1.01 mg/dL (H)). Liver Function Tests: Recent Labs  Lab 05/06/20 0912  AST 15  ALT 14  ALKPHOS 88  BILITOT 0.8  PROT 6.6  ALBUMIN 2.2*   No results for input(s): LIPASE, AMYLASE in the last 168 hours. No results for input(s): AMMONIA in the last 168 hours. Coagulation profile Recent Labs  Lab 05/06/20 0912  INR 1.6*    CBC: Recent Labs  Lab 05/06/20 0912 05/07/20 0556  WBC 9.4 8.4  NEUTROABS 6.7 6.2  HGB 12.9 11.7*  HCT 41.1 36.9  MCV 91.5 91.3  PLT 373 324   Cardiac Enzymes: No results for input(s): CKTOTAL, CKMB, CKMBINDEX, TROPONINI in the last 168 hours. BNP (last 3 results) No results for input(s): PROBNP in the last 8760 hours. CBG: Recent Labs  Lab 05/06/20 1605 05/06/20 2156 05/07/20 0635  GLUCAP 118* 129* 88   D-Dimer: No results for input(s): DDIMER in the last 72 hours. Hgb A1c: Recent Labs    05/06/20 1450  HGBA1C 6.8*   Lipid Profile: No results for input(s): CHOL, HDL, LDLCALC, TRIG, CHOLHDL, LDLDIRECT in the last  72 hours. Thyroid function studies: No results for input(s): TSH, T4TOTAL, T3FREE, THYROIDAB in the last 72 hours.  Invalid input(s): FREET3 Anemia work up: No results for input(s): VITAMINB12, FOLATE, FERRITIN, TIBC, IRON, RETICCTPCT in the last 72 hours. Sepsis Labs: Recent Labs  Lab 05/06/20 0912 05/06/20 1337 05/07/20 0556  PROCALCITON  --  0.95  --   WBC 9.4  --  8.4  LATICACIDVEN 1.1  --   --     Microbiology Recent Results (from  the past 240 hour(s))  Culture, blood (x 2)     Status: None (Preliminary result)   Collection Time: 05/06/20  9:05 AM   Specimen: BLOOD  Result Value Ref Range Status   Specimen Description BLOOD RIGHT ANTECUBITAL  Final   Special Requests   Final    BOTTLES DRAWN AEROBIC AND ANAEROBIC Blood Culture adequate volume   Culture   Final    NO GROWTH < 24 HOURS Performed at [facility], [address] [place], [place] [address]    Report Status PENDING  Incomplete  Culture, blood (x 2)     Status: None (Preliminary result)   Collection Time: 05/06/20  9:10 AM   Specimen: BLOOD RIGHT WRIST  Result Value Ref Range Status   Specimen Description BLOOD RIGHT WRIST  Final   Special Requests   Final    BOTTLES DRAWN AEROBIC AND ANAEROBIC Blood Culture adequate volume   Culture   Final    NO GROWTH < 24 HOURS Performed at [facility], [place] [address] [place], [place] [address]    Report Status PENDING  Incomplete  Respiratory Panel by RT PCR (Flu A&B, Covid) - Nasopharyngeal Swab     Status: None   Collection Time: 05/06/20  9:28 AM   Specimen: Nasopharyngeal Swab  Result Value Ref Range Status   SARS Coronavirus 2 by RT PCR NEGATIVE NEGATIVE Final    Comment: (NOTE) SARS-CoV-2 target nucleic acids are NOT DETECTED. The SARS-CoV-2 RNA is generally detectable in upper respiratoy specimens during the acute phase of infection. The lowest concentration of SARS-CoV-2 viral copies this assay can detect is 131 copies/mL. A negative  result does not preclude SARS-Cov-2 infection and should not be used as the sole basis for treatment or other patient management decisions. A negative result may occur with  improper specimen collection/handling, submission of specimen other than nasopharyngeal swab, presence of viral mutation(s) within the areas targeted by this assay, and inadequate number of viral copies (<131 copies/mL). A negative result must be combined with clinical observations, patient history, and epidemiological information. The expected result is Negative. Fact Sheet for Patients:  PinkCheek.be Fact Sheet for Healthcare Providers:  GravelBags.it This test is not yet ap proved or cleared by the [place] FDA and  has been authorized for detection and/or diagnosis of SARS-CoV-2 by FDA under an Emergency Use Authorization (EUA). This EUA will remain  in effect (meaning this test can be used) for the duration of the COVID-19 declaration under Section 564(b)(1) of the Act, 21 U.S.C. section 360bbb-3(b)(1), unless the authorization is terminated or revoked sooner.    Influenza A by PCR NEGATIVE NEGATIVE Final   Influenza B by PCR NEGATIVE NEGATIVE Final    Comment: (NOTE) The Xpert Xpress SARS-CoV-2/FLU/RSV assay is intended as an aid in  the diagnosis of influenza from Nasopharyngeal swab specimens and  should not be used as a sole basis for treatment. Nasal washings and  aspirates are unacceptable for Xpert Xpress SARS-CoV-2/FLU/RSV  testing. Fact Sheet for Patients: PinkCheek.be Fact Sheet for Healthcare Providers: GravelBags.it This test is not yet approved or cleared by the [place] FDA and  has been authorized for detection and/or diagnosis of SARS-CoV-2 by  FDA under an Emergency Use Authorization (EUA). This EUA will remain  in effect (meaning this test can be used) for the  duration of the  Covid-19 declaration under Section 564(b)(1) of the Act, 21  U.S.C. section 360bbb-3(b)(1), unless the authorization is  terminated  or revoked. Performed at [facility], [place] [address], [place], [place] [address]     Procedures and diagnostic studies:  CT ANGIO CHEST PE W OR WO CONTRAST  Result Date: 05/06/2020 CLINICAL DATA:  Shortness of breath. Recent pneumonia diagnosis. EXAM: CT ANGIOGRAPHY CHEST WITH CONTRAST TECHNIQUE: Multidetector CT imaging of the chest was performed using the standard protocol during bolus administration of intravenous contrast. Multiplanar CT image reconstructions and MIPs were obtained to evaluate the vascular anatomy. CONTRAST:  52mL OMNIPAQUE IOHEXOL 350 MG/ML SOLN COMPARISON:  03/07/2016 FINDINGS: Cardiovascular: No pericardial effusion. Mild biatrial enlargement. The RV is nondilated. Borderline enlargement of central pulmonary arteries. Satisfactory opacification of pulmonary arteries noted, and there is no evidence of pulmonary emboli. Mitral annulus calcifications. Moderate coronary calcifications. Adequate contrast opacification of the thoracic aorta with no evidence of dissection, aneurysm, or stenosis. There is classic 3-vessel brachiocephalic arch anatomy without proximal stenosis. Moderate scattered calcified plaque through the thoracic aorta. Visualized proximal abdominal aorta is atheromatous, nondilated. Mediastinum/Nodes: Partially calcified 1.7 cm subcarinal node. Enlarged right paratracheal nodes up to 1.4 cm. Subcentimeter prevascular and AP window lymph nodes. Partially calcified subcentimeter left hilar lymph nodes Lungs/Pleura: Trace right pleural effusion. No pneumothorax. Extensive airspace opacities throughout superior and posterior segments right upper lobe. Scattered somewhat geographic ground-glass opacities in both lower lobes. Calcified granuloma in the left lower lobe. Secretions partially obstructing right lower lobe  segmental bronchi. Upper Abdomen: Large right renal cyst, incompletely visualized. Partially calcified subcentimeter stones in the dependent aspect of the nondilated gallbladder fundus. No acute findings. Musculoskeletal: Anterior vertebral endplate spurring at multiple levels in the mid and lower thoracic spine. Review of the MIP images confirms the above findings. IMPRESSION: 1. Negative for acute PE or thoracic aortic dissection. 2. Extensive right upper lobe airspace opacities suggesting pneumonia. Followup PA and lateral chest X-ray is recommended in 3-4 weeks following trial of antibiotic therapy to ensure resolution and exclude underlying malignancy. 3. Trace right pleural effusion. 4. Cholelithiasis. Aortic Atherosclerosis (ICD10-I70.0). Electronically Signed   By: Lucrezia Europe M.D.   On: 05/06/2020 14:28   DG Chest Port 1 View  Result Date: 05/06/2020 CLINICAL DATA:  Pneumonia EXAM: PORTABLE CHEST 1 VIEW COMPARISON:  September 25, 2018 FINDINGS: There is airspace consolidation in the right upper lobe. There is a calcified granuloma in the left lower lung region. Lungs otherwise are clear. Heart is upper normal in size with pulmonary vascularity normal. No adenopathy. There is aortic atherosclerosis. No bone lesions. IMPRESSION: Airspace consolidation right upper lobe consistent with pneumonia. Lungs otherwise clear except for small left lower lobe region calcified granuloma. Heart upper normal in size. No demonstrable adenopathy by radiography. Aortic Atherosclerosis (ICD10-I70.0). Electronically Signed   By: Lowella Grip III M.D.   On: 05/06/2020 10:49   ECHOCARDIOGRAM COMPLETE  Result Date: 05/06/2020    ECHOCARDIOGRAM REPORT   Patient Name:   Kathleen Griffith Date of Exam: 05/06/2020 Medical Rec #:  [id number]       Height:       63.0 in Accession #:    [id number]      Weight:       157.2 lb Date of Birth:  1941/02/04       BSA:          1.745 m Patient Age:    18 years        BP:           151/76  mmHg Patient Gender: F  HR:           97 bpm. Exam Location:  Inpatient Procedure: 2D Echo Indications:    dyspnea 786.09  History:        Patient has prior history of Echocardiogram examinations, most                 recent 08/12/2017. Arrythmias:Atrial Fibrillation; Risk                 Factors:Hypertension, Dyslipidemia and Current Smoker.  Sonographer:    Johny Chess Referring Phys: [place]  1. Left ventricular ejection fraction, by estimation, is 50 to 55%. The left ventricle has low normal function. The left ventricle has no regional wall motion abnormalities. Left ventricular diastolic function could not be evaluated.  2. Right ventricular systolic function is normal. The right ventricular size is normal. There is normal pulmonary artery systolic pressure.  3. The mitral valve is normal in structure. No evidence of mitral valve regurgitation. No evidence of mitral stenosis.  4. The aortic valve is normal in structure. Aortic valve regurgitation is not visualized. No aortic stenosis is present. FINDINGS  Left Ventricle: Left ventricular ejection fraction, by estimation, is 50 to 55%. The left ventricle has low normal function. The left ventricle has no regional wall motion abnormalities. The left ventricular internal cavity size was normal in size. There is no left ventricular hypertrophy. Left ventricular diastolic function could not be evaluated due to atrial fibrillation. Left ventricular diastolic function could not be evaluated. Right Ventricle: The right ventricular size is normal. No increase in right ventricular wall thickness. Right ventricular systolic function is normal. There is normal pulmonary artery systolic pressure. The tricuspid regurgitant velocity is 2.86 m/s, and  with an assumed right atrial pressure of 3 mmHg, the estimated right ventricular systolic pressure is 93.2 mmHg. Left Atrium: Left atrial size was normal in size. Right Atrium: Right  atrial size was normal in size. Pericardium: There is no evidence of pericardial effusion. Mitral Valve: The mitral valve is normal in structure. No evidence of mitral valve regurgitation. No evidence of mitral valve stenosis. Tricuspid Valve: The tricuspid valve is grossly normal. Tricuspid valve regurgitation is mild . No evidence of tricuspid stenosis. Aortic Valve: The aortic valve is normal in structure. Aortic valve regurgitation is not visualized. No aortic stenosis is present. Pulmonic Valve: The pulmonic valve was normal in structure. Pulmonic valve regurgitation is not visualized. No evidence of pulmonic stenosis. Aorta: The aortic root and ascending aorta are structurally normal, with no evidence of dilitation. IAS/Shunts: The atrial septum is grossly normal.  LEFT VENTRICLE PLAX 2D LVIDd:         3.80 cm LVIDs:         2.80 cm LV PW:         1.00 cm LV IVS:        1.00 cm LVOT diam:     2.00 cm LVOT Area:     3.14 cm  RIGHT VENTRICLE             IVC RV S prime:     13.70 cm/s  IVC diam: 1.80 cm LEFT ATRIUM             Index       RIGHT ATRIUM           Index LA diam:        3.50 cm 2.01 cm/m  RA Area:     14.80 cm LA Vol (A2C):  42.1 ml 24.12 ml/m RA Volume:   34.30 ml  19.65 ml/m LA Vol (A4C):   40.3 ml 23.09 ml/m LA Biplane Vol: 44.0 ml 25.21 ml/m   AORTA Ao Root diam: 2.90 cm TRICUSPID VALVE TR Peak grad:   32.7 mmHg TR Vmax:        286.00 cm/s  SHUNTS Systemic Diam: 2.00 cm Mertie Moores MD Electronically signed by Mertie Moores MD Signature Date/Time: 05/06/2020/5:20:02 PM    Final     Medications:   . docusate sodium  100 mg Oral BID  . gabapentin  100 mg Oral QHS  . insulin aspart  0-15 Units Subcutaneous TID WC  . insulin aspart  0-5 Units Subcutaneous QHS  . metoprolol tartrate  25 mg Oral BID  . nicotine  14 mg Transdermal Daily  . pantoprazole  40 mg Oral BID  . pravastatin  20 mg Oral QHS  . rivaroxaban  20 mg Oral QHS  . sertraline  50 mg Oral Daily  . sucralfate  1 g  Oral TID WC & HS   Continuous Infusions: . sodium chloride 75 mL/hr at 05/07/20 0610  . azithromycin 500 mg (05/07/20 0909)  . cefTRIAXone (ROCEPHIN)  IV 2 g (05/07/20 0821)     LOS: 1 day   Geradine Girt  [facility]   How to contact the [facility] Attending or Consulting provider [place] or covering provider during after hours [place], for this patient?  1. Check the care team in [facility] and look for a) attending/consulting [facility] provider listed and b) the [facility] team listed 2. Log into [email or web address] and use [place]'s universal password to access. If you do not have the password, please contact the hospital operator. 3. Locate the [facility] provider you are looking for under [facility] and page to a number that you can be directly reached. 4. If you still have difficulty reaching the provider, please page the [facility] (Director on Call) for the Hospitalists listed on amion for assistance.  05/07/2020, 9:45 AM

## 2020-05-07 NOTE — Progress Notes (Signed)
PT Cancellation Note  Patient Details Name: Kathleen Griffith MRN: [id number] DOB: [birth year]   Cancelled Treatment:    Reason Eval/Treat Not Completed: Patient declined, no reason specified. Pt reporting fatigue and nausea. Pt states she sat up to eat breakfast and is refusing further mobility today. PT will attempt to follow up at a later time.   Zenaida Niece 05/07/2020, 10:08 AM

## 2020-05-07 NOTE — Progress Notes (Signed)
OT Cancellation Note  Patient Details Name: Kathleen Griffith MRN: [id number] DOB: 07-Nov-1941   Cancelled Treatment:    Reason Eval/Treat Not Completed: Other (comment); pt declining working with OT today, stating she has been sick and that she "doesn't feel like it". Declining any activity including EOB. Will continue efforts as schedule permits.  Lou Cal, OT Acute Rehabilitation Services Pager [phone] Office [phone]   Raymondo Band 05/07/2020, 11:57 AM

## 2020-05-08 ENCOUNTER — Encounter (HOSPITAL_COMMUNITY): Payer: Self-pay | Admitting: Internal Medicine

## 2020-05-08 DIAGNOSIS — Z720 Tobacco use: Secondary | ICD-10-CM

## 2020-05-08 DIAGNOSIS — I10 Essential (primary) hypertension: Secondary | ICD-10-CM

## 2020-05-08 DIAGNOSIS — I4819 Other persistent atrial fibrillation: Secondary | ICD-10-CM

## 2020-05-08 LAB — BASIC METABOLIC PANEL
Anion gap: 9 (ref 5–15)
BUN: 18 mg/dL (ref 8–23)
CO2: 23 mmol/L (ref 22–32)
Calcium: 8.4 mg/dL — ABNORMAL LOW (ref 8.9–10.3)
Chloride: 105 mmol/L (ref 98–111)
Creatinine, Ser: 1.05 mg/dL — ABNORMAL HIGH (ref 0.44–1.00)
GFR calc Af Amer: 58 mL/min — ABNORMAL LOW (ref >60)
GFR calc non Af Amer: 50 mL/min — ABNORMAL LOW (ref >60)
Glucose, Bld: 178 mg/dL — ABNORMAL HIGH (ref 70–99)
Potassium: 4 mmol/L (ref 3.5–5.1)
Sodium: 137 mmol/L (ref 135–145)

## 2020-05-08 LAB — GLUCOSE, CAPILLARY
Glucose-Capillary: 133 mg/dL — ABNORMAL HIGH (ref 70–99)
Glucose-Capillary: 159 mg/dL — ABNORMAL HIGH (ref 70–99)
Glucose-Capillary: 94 mg/dL (ref 70–99)
Glucose-Capillary: 174 mg/dL — ABNORMAL HIGH (ref 70–99)

## 2020-05-08 LAB — CBC
HCT: 38.9 % (ref 36.0–46.0)
Hemoglobin: 12 g/dL (ref 12.0–15.0)
MCH: 28.6 pg (ref 26.0–34.0)
MCHC: 30.8 g/dL (ref 30.0–36.0)
MCV: 92.6 fL (ref 80.0–100.0)
Platelets: 328 10*3/uL (ref 150–400)
RBC: 4.2 MIL/uL (ref 3.87–5.11)
RDW: 14.5 % (ref 11.5–15.5)
WBC: 9.5 10*3/uL (ref 4.0–10.5)
nRBC: 0 % (ref 0.0–0.2)

## 2020-05-08 LAB — MAGNESIUM: Magnesium: 1.7 mg/dL (ref 1.7–2.4)

## 2020-05-08 MED ORDER — METOPROLOL TARTRATE 50 MG PO TABS
50.0000 mg | ORAL_TABLET | Freq: Two times a day (BID) | ORAL | Status: DC
  Administered 2020-05-08 – 2020-05-11 (×7): 50 mg via ORAL
  Filled 2020-05-08 (×7): qty 1

## 2020-05-08 MED ORDER — MAGNESIUM SULFATE 2 GM/50ML IV SOLN
2.0000 g | Freq: Once | INTRAVENOUS | Status: AC
  Administered 2020-05-08: 2 g via INTRAVENOUS
  Filled 2020-05-08: qty 50

## 2020-05-08 NOTE — Consult Note (Signed)
Cardiology Consultation:   Patient ID: Kathleen Griffith MRN: [id number]; DOB: 1941-12-05  Admit date: 05/06/2020 Date of Consult: 05/08/2020  Primary Care Provider: No primary care provider on file. Primary Cardiologist: Kirk Ruths, MD    Patient Profile:   Kathleen Griffith is a 79 y.o. female with a hx PAF, COPD, PVD, PMR, hypertension, hyperlipidemia, DM, fibromyalgia of  who is being seen today for the evaluation of persistent atrial fibrillation at the request of Vilinda Blanks DO.  History of Present Illness:   Patient admitted with non-ST elevation myocardial infarction in June 2018. Cardiac catheterization revealed a 20% proximal to mid LAD and normal left ventricular end-diastolic pressure. Echocardiogram showed ejection fraction 66-44%, grade 2 diastolic dysfunction, mild tricuspid regurgitation, severely elevated pulmonary pressure. Sludge noted LV apex. 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 Was noted to have intermittent atrial fibrillation;felt to be secondary to stress of sepsis.  Patient recently traveled to [place] to visit a friend.  Upon returning she developed general malaise, weakness, palpitations consistent with atrial fibrillation, cough productive of greenish sputum.  She denies chest pain or syncope.  She did note some increased dyspnea on exertion.  She was seen by primary care and given azithromycin but her symptoms did not improve.  She had decreased appetite.  As her symptoms worsen she was admitted and found to have pneumonia.  She is also in atrial fibrillation and cardiology asked to evaluate.    Past Medical History:  Diagnosis Date  . Anemia    microcytic   . Arthritis   . Atrial fibrillation ([place])   . Cataract    REMOVED BILATERAL  . Diabetes mellitus ([place])   . Diverticulosis   . Duodenitis    . Fibromyalgia   . Gastric polyp   . Gastroparesis   . Hyperlipidemia   . Hypertension   . Kidney stones   . Melanoma ([place])   . Microcytic anemia   . PMR (polymyalgia rheumatica) ([facility])   . PVD (peripheral vascular disease) ([place])   . Renal cyst   . Tubular adenoma of colon     Past Surgical History:  Procedure Laterality Date  . APPENDECTOMY    . BREAST BIOPSY    . CESAREAN SECTION    . COLONOSCOPY    . COLONOSCOPY WITH PROPOFOL N/A 12/26/2015   Procedure: COLONOSCOPY WITH PROPOFOL;  Surgeon: Manus Gunning, MD;  Location: [facility];  Service: Gastroenterology;  Laterality: N/A;  . ESOPHAGOGASTRODUODENOSCOPY (EGD) WITH PROPOFOL N/A 12/26/2015   Procedure: ESOPHAGOGASTRODUODENOSCOPY (EGD) WITH PROPOFOL;  Surgeon: Manus Gunning, MD;  Location: [facility];  Service: Gastroenterology;  Laterality: N/A;  . LEFT HEART CATH AND CORONARY ANGIOGRAPHY N/A 06/03/2017   Procedure: Left Heart Cath and Coronary Angiography;  Surgeon: Martinique, Peter M, MD;  Location: [facility];  Service: Cardiovascular;  Laterality: N/A;  . POLYPECTOMY       Inpatient Medications: Scheduled Meds: . docusate sodium  100 mg Oral BID  . gabapentin  100 mg Oral QHS  . insulin aspart  0-15 Units Subcutaneous TID WC  . insulin aspart  0-5 Units Subcutaneous QHS  . metoprolol tartrate  50 mg Oral BID  . nicotine  14 mg Transdermal Daily  . pantoprazole  40 mg Oral BID  . pravastatin  20 mg Oral QHS  . rivaroxaban  20 mg Oral QHS  . sertraline  50 mg Oral  Daily  . sucralfate  1 g Oral TID WC & HS   Continuous Infusions: . azithromycin 500 mg (05/08/20 1015)  . cefTRIAXone (ROCEPHIN)  IV 2 g (05/08/20 0931)  . magnesium sulfate bolus IVPB     PRN Meds: acetaminophen **OR** acetaminophen, albuterol, bisacodyl, HYDROcodone-acetaminophen, morphine injection, ondansetron **OR** ondansetron (ZOFRAN) IV, polyethylene glycol, zolpidem  Allergies:    Allergies  Allergen Reactions  .  Anoro Ellipta [Umeclidinium-Vilanterol] Palpitations  . Losartan Shortness Of Breath    wheezing wheezing  . Norvasc [Amlodipine Besylate] Swelling  . Pregabalin Swelling    TO LOWER EXTREMTIES TO LOWER EXTREMTIES  . Bupropion     Other reaction(s): Other Sucidal thoughts  . Metoclopramide Other (See Comments)    Tardive dyskinesia, tremors  . Eliquis [Apixaban] Nausea And Vomiting    Severe stomach pain  . Lisinopril Cough  . Metformin Diarrhea  . Metformin And Related Diarrhea    Social History:   Social History   Socioeconomic History  . Marital status: Widowed    Spouse name: Not on file  . Number of children: 1  . Years of education: Not on file  . Highest education level: Not on file  Occupational History  . Not on file  Tobacco Use  . Smoking status: Current Every Day Smoker    Packs/day: 1.00    Years: 60.00    Pack years: 60.00    Types: Cigarettes  . Smokeless tobacco: Never Used  Substance and Sexual Activity  . Alcohol use: No    Alcohol/week: 0.0 standard drinks  . Drug use: No  . Sexual activity: Not on file  Other Topics Concern  . Not on file  Social History Narrative  . Not on file   Social Determinants of Health   Financial Resource Strain:   . Difficulty of Paying Living Expenses:   Food Insecurity:   . Worried About Charity fundraiser in the Last Year:   . Arboriculturist in the Last Year:   Transportation Needs:   . Film/video editor (Medical):   Marland Kitchen Lack of Transportation (Non-Medical):   Physical Activity:   . Days of Exercise per Week:   . Minutes of Exercise per Session:   Stress:   . Feeling of Stress :   Social Connections:   . Frequency of Communication with Friends and Family:   . Frequency of Social Gatherings with Friends and Family:   . Attends Religious Services:   . Active Member of Clubs or Organizations:   . Attends Archivist Meetings:   Marland Kitchen Marital Status:   Intimate Partner Violence:   . Fear of  Current or Ex-Partner:   . Emotionally Abused:   Marland Kitchen Physically Abused:   . Sexually Abused:     Family History:    Family History  Problem Relation Age of Onset  . CAD Father        MI and CABG  . Diabetes Father   . Heart disease Father   . Colon cancer Mother 57     ROS:  Please see the history of present illness.  Cough productive of green sputum, general malaise and palpitations.  No hemoptysis.  Diminished appetite noted. All other ROS reviewed and negative.     Physical Exam/Data:   Vitals:   05/08/20 0633 05/08/20 0833 05/08/20 0954 05/08/20 1150  BP: 138/82 (!) 136/46 136/60 (!) 132/59  Pulse: 86 81 65 (!) 53  Resp: 16  18 19  Temp: 97.6 F (36.4 C)   98 F (36.7 C)  TempSrc: Oral     SpO2: 93%  93% 95%  Weight:      Height:        Intake/Output Summary (Last 24 hours) at 05/08/2020 1516 Last data filed at 05/08/2020 1409 Gross per 24 hour  Intake 1299.94 ml  Output 850 ml  Net 449.94 ml   Last 3 Weights 05/06/2020 12/30/2019 11/15/2019  Weight (lbs) 157 lb 3 oz 156 lb 4 oz 158 lb  Weight (kg) 71.3 kg 70.875 kg 71.668 kg     Body mass index is 27.84 kg/m.  General:  Well nourished, well developed, in no acute distress HEENT: normal Lymph: no adenopathy Neck: no JVD Endocrine:  No thryomegaly Vascular: No carotid bruits; FA pulses 2+ bilaterally without bruits  Cardiac:  normal S1, S2; irregular Lungs: Diminished BS and expiratory wheeze Abd: soft, nontender, no hepatomegaly  Ext: no edema Musculoskeletal:  No deformities, BUE and BLE strength normal and equal Skin: warm and dry  Neuro:  CNs 2-12 intact, no focal abnormalities noted Psych:  Normal affect   EKG:  The EKG was personally reviewed and demonstrates: Atrial fibrillation with nonspecific ST changes Telemetry:  Telemetry was personally reviewed and demonstrates: Atrial fibrillation with right upper normal to mildly elevated.  Relevant CV Studies: Echocardiogram shows normal LV  function.  Laboratory Data: Chemistry Recent Labs  Lab 05/06/20 0912 05/07/20 0556 05/08/20 1049  NA 136 138 137  K 3.6 3.3* 4.0  CL 98 105 105  CO2 28 24 23   GLUCOSE 146* 93 178*  BUN 40* 23 18  CREATININE 1.16* 1.01* 1.05*  CALCIUM 8.9 8.3* 8.4*  GFRNONAA 45* 53* 50*  GFRAA 52* >60 58*  ANIONGAP 10 9 9     Recent Labs  Lab 05/06/20 0912  PROT 6.6  ALBUMIN 2.2*  AST 15  ALT 14  ALKPHOS 88  BILITOT 0.8   Hematology Recent Labs  Lab 05/06/20 0912 05/07/20 0556 05/08/20 1049  WBC 9.4 8.4 9.5  RBC 4.49 4.04 4.20  HGB 12.9 11.7* 12.0  HCT 41.1 36.9 38.9  MCV 91.5 91.3 92.6  MCH 28.7 29.0 28.6  MCHC 31.4 31.7 30.8  RDW 14.3 14.4 14.5  PLT 373 324 328   BNP Recent Labs  Lab 05/06/20 1000  BNP 742.9*     Radiology/Studies:  CT ANGIO CHEST PE W OR WO CONTRAST  Result Date: 05/06/2020 CLINICAL DATA:  Shortness of breath. Recent pneumonia diagnosis. EXAM: CT ANGIOGRAPHY CHEST WITH CONTRAST TECHNIQUE: Multidetector CT imaging of the chest was performed using the standard protocol during bolus administration of intravenous contrast. Multiplanar CT image reconstructions and MIPs were obtained to evaluate the vascular anatomy. CONTRAST:  84mL OMNIPAQUE IOHEXOL 350 MG/ML SOLN COMPARISON:  03/07/2016 FINDINGS: Cardiovascular: No pericardial effusion. Mild biatrial enlargement. The RV is nondilated. Borderline enlargement of central pulmonary arteries. Satisfactory opacification of pulmonary arteries noted, and there is no evidence of pulmonary emboli. Mitral annulus calcifications. Moderate coronary calcifications. Adequate contrast opacification of the thoracic aorta with no evidence of dissection, aneurysm, or stenosis. There is classic 3-vessel brachiocephalic arch anatomy without proximal stenosis. Moderate scattered calcified plaque through the thoracic aorta. Visualized proximal abdominal aorta is atheromatous, nondilated. Mediastinum/Nodes: Partially calcified 1.7 cm  subcarinal node. Enlarged right paratracheal nodes up to 1.4 cm. Subcentimeter prevascular and AP window lymph nodes. Partially calcified subcentimeter left hilar lymph nodes Lungs/Pleura: Trace right pleural effusion. No pneumothorax. Extensive airspace opacities throughout superior and posterior  segments right upper lobe. Scattered somewhat geographic ground-glass opacities in both lower lobes. Calcified granuloma in the left lower lobe. Secretions partially obstructing right lower lobe segmental bronchi. Upper Abdomen: Large right renal cyst, incompletely visualized. Partially calcified subcentimeter stones in the dependent aspect of the nondilated gallbladder fundus. No acute findings. Musculoskeletal: Anterior vertebral endplate spurring at multiple levels in the mid and lower thoracic spine. Review of the MIP images confirms the above findings. IMPRESSION: 1. Negative for acute PE or thoracic aortic dissection. 2. Extensive right upper lobe airspace opacities suggesting pneumonia. Followup PA and lateral chest X-ray is recommended in 3-4 weeks following trial of antibiotic therapy to ensure resolution and exclude underlying malignancy. 3. Trace right pleural effusion. 4. Cholelithiasis. Aortic Atherosclerosis (ICD10-I70.0). Electronically Signed   By: Lucrezia Europe M.D.   On: 05/06/2020 14:28   DG Chest Port 1 View  Result Date: 05/06/2020 CLINICAL DATA:  Pneumonia EXAM: PORTABLE CHEST 1 VIEW COMPARISON:  September 25, 2018 FINDINGS: There is airspace consolidation in the right upper lobe. There is a calcified granuloma in the left lower lung region. Lungs otherwise are clear. Heart is upper normal in size with pulmonary vascularity normal. No adenopathy. There is aortic atherosclerosis. No bone lesions. IMPRESSION: Airspace consolidation right upper lobe consistent with pneumonia. Lungs otherwise clear except for small left lower lobe region calcified granuloma. Heart upper normal in size. No demonstrable  adenopathy by radiography. Aortic Atherosclerosis (ICD10-I70.0). Electronically Signed   By: Lowella Grip III M.D.   On: 05/06/2020 10:49   ECHOCARDIOGRAM COMPLETE  Result Date: 05/06/2020    ECHOCARDIOGRAM REPORT   Patient Name:   Kathleen Griffith Date of Exam: 05/06/2020 Medical Rec #:  [id number]       Height:       63.0 in Accession #:    [id number]      Weight:       157.2 lb Date of Birth:  1941-10-14       BSA:          1.745 m Patient Age:    78 years        BP:           151/76 mmHg Patient Gender: F               HR:           97 bpm. Exam Location:  Inpatient Procedure: 2D Echo Indications:    dyspnea 786.09  History:        Patient has prior history of Echocardiogram examinations, most                 recent 08/12/2017. Arrythmias:Atrial Fibrillation; Risk                 Factors:Hypertension, Dyslipidemia and Current Smoker.  Sonographer:    Johny Chess Referring Phys: [place]  1. Left ventricular ejection fraction, by estimation, is 50 to 55%. The left ventricle has low normal function. The left ventricle has no regional wall motion abnormalities. Left ventricular diastolic function could not be evaluated.  2. Right ventricular systolic function is normal. The right ventricular size is normal. There is normal pulmonary artery systolic pressure.  3. The mitral valve is normal in structure. No evidence of mitral valve regurgitation. No evidence of mitral stenosis.  4. The aortic valve is normal in structure. Aortic valve regurgitation is not visualized. No aortic stenosis is present. FINDINGS  Left Ventricle: Left ventricular ejection fraction, by estimation, is 50  to 55%. The left ventricle has low normal function. The left ventricle has no regional wall motion abnormalities. The left ventricular internal cavity size was normal in size. There is no left ventricular hypertrophy. Left ventricular diastolic function could not be evaluated due to atrial fibrillation. Left  ventricular diastolic function could not be evaluated. Right Ventricle: The right ventricular size is normal. No increase in right ventricular wall thickness. Right ventricular systolic function is normal. There is normal pulmonary artery systolic pressure. The tricuspid regurgitant velocity is 2.86 m/s, and  with an assumed right atrial pressure of 3 mmHg, the estimated right ventricular systolic pressure is 67.2 mmHg. Left Atrium: Left atrial size was normal in size. Right Atrium: Right atrial size was normal in size. Pericardium: There is no evidence of pericardial effusion. Mitral Valve: The mitral valve is normal in structure. No evidence of mitral valve regurgitation. No evidence of mitral valve stenosis. Tricuspid Valve: The tricuspid valve is grossly normal. Tricuspid valve regurgitation is mild . No evidence of tricuspid stenosis. Aortic Valve: The aortic valve is normal in structure. Aortic valve regurgitation is not visualized. No aortic stenosis is present. Pulmonic Valve: The pulmonic valve was normal in structure. Pulmonic valve regurgitation is not visualized. No evidence of pulmonic stenosis. Aorta: The aortic root and ascending aorta are structurally normal, with no evidence of dilitation. IAS/Shunts: The atrial septum is grossly normal.  LEFT VENTRICLE PLAX 2D LVIDd:         3.80 cm LVIDs:         2.80 cm LV PW:         1.00 cm LV IVS:        1.00 cm LVOT diam:     2.00 cm LVOT Area:     3.14 cm  RIGHT VENTRICLE             IVC RV S prime:     13.70 cm/s  IVC diam: 1.80 cm LEFT ATRIUM             Index       RIGHT ATRIUM           Index LA diam:        3.50 cm 2.01 cm/m  RA Area:     14.80 cm LA Vol (A2C):   42.1 ml 24.12 ml/m RA Volume:   34.30 ml  19.65 ml/m LA Vol (A4C):   40.3 ml 23.09 ml/m LA Biplane Vol: 44.0 ml 25.21 ml/m   AORTA Ao Root diam: 2.90 cm TRICUSPID VALVE TR Peak grad:   32.7 mmHg TR Vmax:        286.00 cm/s  SHUNTS Systemic Diam: 2.00 cm Mertie Moores MD Electronically  signed by Mertie Moores MD Signature Date/Time: 05/06/2020/5:20:02 PM    Final      Assessment and Plan:   1. Persistent atrial fibrillation-atrial fibrillation likely precipitated by pneumonia.  Echo shows normal LV function.  Will repeat TSH.  Continue metoprolol for rate control (reasonable at present and will follow on telemetry).  Continue Xarelto (CHADSvasc 6).  Would continue therapy for pneumonia which is likely driving atrial fibrillation.  Once it is treated if atrial fibrillation persists could proceed with cardioversion as she has missed no doses of Xarelto. 2. Pneumonia-continue azithromycin and Rocephin.  Management per primary care. 3. Hypertension-continue present blood pressure medications and follow. 4. Tobacco abuse-patient states she discontinued 3 weeks ago.    For questions or updates, please contact [place] Please consult [email or web address] for contact  info under     Signed, Kirk Ruths, MD  05/08/2020 3:16 PM

## 2020-05-08 NOTE — Progress Notes (Signed)
Progress Note    Kathleen Griffith  OMB:[id number] DOB: August 03, 1941  DOA: 05/06/2020 PCP: No primary care provider on file.    Brief Narrative:     Medical records reviewed and are as summarized below:  Kathleen Griffith is an 79 y.o. female with medical history significant of PVD: PMR; HTN; HLD; DM with gastroparesis; fibromyalgia; and afib presenting with refractory PNA.  She reports that she was doing fine.  She flew to [place] to visit a friend.  The day after returning home, she was in afib (hasn't been in that in over a year).  She fell the night before she came home for the first time in over a year.  Things just went from bad to worse.  She fell at home 5 days later.  She has no energy, takes everything she can do to do anything.  She gets up on the side of the chair or bed and has to sit there for an hour or more to get the energy to get up.  She can't fix meals.  She drinks water.  Yesterday, she ate one piece of toast all day.  Not really SOB, only with conversation or moving around.  +cough, productive of greenish sputum.  No fever.  She saw Dr. Coletta Memos on 5/5 and he sent her for CXR and ankle xray (from fall); he told her he thought she had atypical PNA and called in azithromycin.  .    Assessment/Plan:   Principal Problem:   CAP (community acquired pneumonia) Active Problems:   Tobacco abuse   Essential hypertension   Hyperlipidemia   Atrial fibrillation with RVR (HCC)   DNR (do not resuscitate)   CAP -Given productive cough and infiltrate in right upper lobe on chest x-ray, most likely community-acquired pneumonia. -She was already treated with Azithromycin monotherapy x 2-3 days without improvement -Influenza negative. -COVID-19 negative. -CTA: Extensive right upper lobe airspace opacities suggesting pneumonia. Followup PA and lateral chest X-ray is recommended in 3-4 weeks following trial of antibiotic therapy to ensure resolution and exclude underlying malignancy.  (patient informed of need for repeat x ray on 5/9) -pro calcitonin elevated -Azithromycin 500 mg daily AND Rocephin due to no risk factors for MDR cause. - albuterol PRN  DM- type 2 -A1c 6.8 -Appears to have erratic use of Lantus (last used sometime last week) -SSI -Diabetes coordinator consulted  HTN -Continue Lopressor  HLD -Continue Pravachol  Afib - parox -upon admission rate controlled at rest but clearly uncontrolled with minimal exertion; this may be related to recent noncompliance with Lopressor  -She has h/o Takotsubo cardiomyopathy in 2018 - EF 25% with grade 2 diastolic dysfunction in June and improved to 55-60% with grade 1 DD in August-- echo this admission is similar -Continue Xarelto -have titrated rate controlling medications, she does not seem to tolerate fib well- will ask for cardiology eval  Tobacco dependence -Encourage cessation.   -This was discussed with the patient and should be reviewed on an ongoing basis.   -Patch ordered  -Continue prn Albuterol  AM labs pending  Family Communication/Anticipated D/C date and plan/Code Status   DVT prophylaxis: Lovenox ordered. Code Status: DNR Disposition Plan: Status is: Inpatient  Remains inpatient appropriate because:Inpatient level of care appropriate due to severity of illness   Dispo: The patient is from: Home              Anticipated d/c is to: Home  Anticipated d/c date is: 2 days              Patient currently is not medically stable to d/c. -- continues with a fib with RVR and does not tolerate well-- cardiology consult    Medical Consultants:    cards   Subjective:   Does not feel well-- only ate toast this AM  Objective:    Vitals:   05/07/20 1634 05/07/20 2032 05/08/20 0633 05/08/20 0833  BP: (!) 156/100 (!) 133/97 138/82 (!) 136/46  Pulse: (!) 102 72 86 81  Resp: 16 16 16    Temp:  98 F (36.7 C) 97.6 F (36.4 C)   TempSrc:  Oral Oral   SpO2: 99% 95% 93%     Weight:      Height:        Intake/Output Summary (Last 24 hours) at 05/08/2020 0857 Last data filed at 05/08/2020 0300 Gross per 24 hour  Intake 3013.27 ml  Output 450 ml  Net 2563.27 ml   Filed Weights   05/06/20 1539  Weight: 71.3 kg    Exam:  General: Appearance:     Well developed, well nourished female in no acute distress  Eyes:    PERRL, conjunctiva/corneas clear, EOM's intact       Lungs:     Clear to auscultation bilaterally, respirations unlabored  Heart:    Normal heart rate. irregular No murmurs, rubs, or gallops.   MS:   All extremities are intact.   Neurologic:   Awake, alert, oriented x 3. Flat affect    Data Reviewed:   I have personally reviewed following labs and imaging studies:  Labs: Labs show the following:   Basic Metabolic Panel: Recent Labs  Lab 05/06/20 0912 05/07/20 0556  NA 136 138  K 3.6 3.3*  CL 98 105  CO2 28 24  GLUCOSE 146* 93  BUN 40* 23  CREATININE 1.16* 1.01*  CALCIUM 8.9 8.3*   GFR Estimated Creatinine Clearance: 42.8 mL/min (A) (by C-G formula based on SCr of 1.01 mg/dL (H)). Liver Function Tests: Recent Labs  Lab 05/06/20 0912  AST 15  ALT 14  ALKPHOS 88  BILITOT 0.8  PROT 6.6  ALBUMIN 2.2*   No results for input(s): LIPASE, AMYLASE in the last 168 hours. No results for input(s): AMMONIA in the last 168 hours. Coagulation profile Recent Labs  Lab 05/06/20 0912  INR 1.6*    CBC: Recent Labs  Lab 05/06/20 0912 05/07/20 0556  WBC 9.4 8.4  NEUTROABS 6.7 6.2  HGB 12.9 11.7*  HCT 41.1 36.9  MCV 91.5 91.3  PLT 373 324   Cardiac Enzymes: No results for input(s): CKTOTAL, CKMB, CKMBINDEX, TROPONINI in the last 168 hours. BNP (last 3 results) No results for input(s): PROBNP in the last 8760 hours. CBG: Recent Labs  Lab 05/07/20 0635 05/07/20 1107 05/07/20 1610 05/07/20 2128 05/08/20 0630  GLUCAP 88 135* 121* 127* 133*   D-Dimer: No results for input(s): DDIMER in the last 72 hours. Hgb  A1c: Recent Labs    05/06/20 1450  HGBA1C 6.8*   Lipid Profile: No results for input(s): CHOL, HDL, LDLCALC, TRIG, CHOLHDL, LDLDIRECT in the last 72 hours. Thyroid function studies: No results for input(s): TSH, T4TOTAL, T3FREE, THYROIDAB in the last 72 hours.  Invalid input(s): FREET3 Anemia work up: No results for input(s): VITAMINB12, FOLATE, FERRITIN, TIBC, IRON, RETICCTPCT in the last 72 hours. Sepsis Labs: Recent Labs  Lab 05/06/20 0912 05/06/20 1337 05/07/20 0556  PROCALCITON  --  0.95  --   WBC 9.4  --  8.4  LATICACIDVEN 1.1  --   --     Microbiology Recent Results (from the past 240 hour(s))  Culture, blood (x 2)     Status: None (Preliminary result)   Collection Time: 05/06/20  9:05 AM   Specimen: BLOOD  Result Value Ref Range Status   Specimen Description BLOOD RIGHT ANTECUBITAL  Final   Special Requests   Final    BOTTLES DRAWN AEROBIC AND ANAEROBIC Blood Culture adequate volume   Culture   Final    NO GROWTH 2 DAYS Performed at [facility], [address] [place], [place] [address]    Report Status PENDING  Incomplete  Culture, blood (x 2)     Status: None (Preliminary result)   Collection Time: 05/06/20  9:10 AM   Specimen: BLOOD RIGHT WRIST  Result Value Ref Range Status   Specimen Description BLOOD RIGHT WRIST  Final   Special Requests   Final    BOTTLES DRAWN AEROBIC AND ANAEROBIC Blood Culture adequate volume   Culture   Final    NO GROWTH 2 DAYS Performed at [facility], [place] [address], [place], [place] [address]    Report Status PENDING  Incomplete  Respiratory Panel by RT PCR (Flu A&B, Covid) - Nasopharyngeal Swab     Status: None   Collection Time: 05/06/20  9:28 AM   Specimen: Nasopharyngeal Swab  Result Value Ref Range Status   SARS Coronavirus 2 by RT PCR NEGATIVE NEGATIVE Final    Comment: (NOTE) SARS-CoV-2 target nucleic acids are NOT DETECTED. The SARS-CoV-2 RNA is generally detectable in upper respiratoy specimens  during the acute phase of infection. The lowest concentration of SARS-CoV-2 viral copies this assay can detect is 131 copies/mL. A negative result does not preclude SARS-Cov-2 infection and should not be used as the sole basis for treatment or other patient management decisions. A negative result may occur with  improper specimen collection/handling, submission of specimen other than nasopharyngeal swab, presence of viral mutation(s) within the areas targeted by this assay, and inadequate number of viral copies (<131 copies/mL). A negative result must be combined with clinical observations, patient history, and epidemiological information. The expected result is Negative. Fact Sheet for Patients:  PinkCheek.be Fact Sheet for Healthcare Providers:  GravelBags.it This test is not yet ap proved or cleared by the [place] FDA and  has been authorized for detection and/or diagnosis of SARS-CoV-2 by FDA under an Emergency Use Authorization (EUA). This EUA will remain  in effect (meaning this test can be used) for the duration of the COVID-19 declaration under Section 564(b)(1) of the Act, 21 U.S.C. section 360bbb-3(b)(1), unless the authorization is terminated or revoked sooner.    Influenza A by PCR NEGATIVE NEGATIVE Final   Influenza B by PCR NEGATIVE NEGATIVE Final    Comment: (NOTE) The Xpert Xpress SARS-CoV-2/FLU/RSV assay is intended as an aid in  the diagnosis of influenza from Nasopharyngeal swab specimens and  should not be used as a sole basis for treatment. Nasal washings and  aspirates are unacceptable for Xpert Xpress SARS-CoV-2/FLU/RSV  testing. Fact Sheet for Patients: PinkCheek.be Fact Sheet for Healthcare Providers: GravelBags.it This test is not yet approved or cleared by the [place] FDA and  has been authorized for detection and/or diagnosis of  SARS-CoV-2 by  FDA under an Emergency Use Authorization (EUA). This EUA will remain  in effect (meaning this test can be used)  for the duration of the  Covid-19 declaration under Section 564(b)(1) of the Act, 21  U.S.C. section 360bbb-3(b)(1), unless the authorization is  terminated or revoked. Performed at [facility], [place] [address], [place], [place] [address]     Procedures and diagnostic studies:  CT ANGIO CHEST PE W OR WO CONTRAST  Result Date: 05/06/2020 CLINICAL DATA:  Shortness of breath. Recent pneumonia diagnosis. EXAM: CT ANGIOGRAPHY CHEST WITH CONTRAST TECHNIQUE: Multidetector CT imaging of the chest was performed using the standard protocol during bolus administration of intravenous contrast. Multiplanar CT image reconstructions and MIPs were obtained to evaluate the vascular anatomy. CONTRAST:  72mL OMNIPAQUE IOHEXOL 350 MG/ML SOLN COMPARISON:  03/07/2016 FINDINGS: Cardiovascular: No pericardial effusion. Mild biatrial enlargement. The RV is nondilated. Borderline enlargement of central pulmonary arteries. Satisfactory opacification of pulmonary arteries noted, and there is no evidence of pulmonary emboli. Mitral annulus calcifications. Moderate coronary calcifications. Adequate contrast opacification of the thoracic aorta with no evidence of dissection, aneurysm, or stenosis. There is classic 3-vessel brachiocephalic arch anatomy without proximal stenosis. Moderate scattered calcified plaque through the thoracic aorta. Visualized proximal abdominal aorta is atheromatous, nondilated. Mediastinum/Nodes: Partially calcified 1.7 cm subcarinal node. Enlarged right paratracheal nodes up to 1.4 cm. Subcentimeter prevascular and AP window lymph nodes. Partially calcified subcentimeter left hilar lymph nodes Lungs/Pleura: Trace right pleural effusion. No pneumothorax. Extensive airspace opacities throughout superior and posterior segments right upper lobe. Scattered somewhat geographic  ground-glass opacities in both lower lobes. Calcified granuloma in the left lower lobe. Secretions partially obstructing right lower lobe segmental bronchi. Upper Abdomen: Large right renal cyst, incompletely visualized. Partially calcified subcentimeter stones in the dependent aspect of the nondilated gallbladder fundus. No acute findings. Musculoskeletal: Anterior vertebral endplate spurring at multiple levels in the mid and lower thoracic spine. Review of the MIP images confirms the above findings. IMPRESSION: 1. Negative for acute PE or thoracic aortic dissection. 2. Extensive right upper lobe airspace opacities suggesting pneumonia. Followup PA and lateral chest X-ray is recommended in 3-4 weeks following trial of antibiotic therapy to ensure resolution and exclude underlying malignancy. 3. Trace right pleural effusion. 4. Cholelithiasis. Aortic Atherosclerosis (ICD10-I70.0). Electronically Signed   By: Lucrezia Europe M.D.   On: 05/06/2020 14:28   DG Chest Port 1 View  Result Date: 05/06/2020 CLINICAL DATA:  Pneumonia EXAM: PORTABLE CHEST 1 VIEW COMPARISON:  September 25, 2018 FINDINGS: There is airspace consolidation in the right upper lobe. There is a calcified granuloma in the left lower lung region. Lungs otherwise are clear. Heart is upper normal in size with pulmonary vascularity normal. No adenopathy. There is aortic atherosclerosis. No bone lesions. IMPRESSION: Airspace consolidation right upper lobe consistent with pneumonia. Lungs otherwise clear except for small left lower lobe region calcified granuloma. Heart upper normal in size. No demonstrable adenopathy by radiography. Aortic Atherosclerosis (ICD10-I70.0). Electronically Signed   By: Lowella Grip III M.D.   On: 05/06/2020 10:49   ECHOCARDIOGRAM COMPLETE  Result Date: 05/06/2020    ECHOCARDIOGRAM REPORT   Patient Name:   Kathleen Griffith Date of Exam: 05/06/2020 Medical Rec #:  [id number]       Height:       63.0 in Accession #:    [id number]       Weight:       157.2 lb Date of Birth:  [birth year]       BSA:          1.745 m Patient Age:    68 years  BP:           151/76 mmHg Patient Gender: F               HR:           97 bpm. Exam Location:  Inpatient Procedure: 2D Echo Indications:    dyspnea 786.09  History:        Patient has prior history of Echocardiogram examinations, most                 recent 08/12/2017. Arrythmias:Atrial Fibrillation; Risk                 Factors:Hypertension, Dyslipidemia and Current Smoker.  Sonographer:    Johny Chess Referring Phys: [place]  1. Left ventricular ejection fraction, by estimation, is 50 to 55%. The left ventricle has low normal function. The left ventricle has no regional wall motion abnormalities. Left ventricular diastolic function could not be evaluated.  2. Right ventricular systolic function is normal. The right ventricular size is normal. There is normal pulmonary artery systolic pressure.  3. The mitral valve is normal in structure. No evidence of mitral valve regurgitation. No evidence of mitral stenosis.  4. The aortic valve is normal in structure. Aortic valve regurgitation is not visualized. No aortic stenosis is present. FINDINGS  Left Ventricle: Left ventricular ejection fraction, by estimation, is 50 to 55%. The left ventricle has low normal function. The left ventricle has no regional wall motion abnormalities. The left ventricular internal cavity size was normal in size. There is no left ventricular hypertrophy. Left ventricular diastolic function could not be evaluated due to atrial fibrillation. Left ventricular diastolic function could not be evaluated. Right Ventricle: The right ventricular size is normal. No increase in right ventricular wall thickness. Right ventricular systolic function is normal. There is normal pulmonary artery systolic pressure. The tricuspid regurgitant velocity is 2.86 m/s, and  with an assumed right atrial pressure of 3 mmHg,  the estimated right ventricular systolic pressure is 27.7 mmHg. Left Atrium: Left atrial size was normal in size. Right Atrium: Right atrial size was normal in size. Pericardium: There is no evidence of pericardial effusion. Mitral Valve: The mitral valve is normal in structure. No evidence of mitral valve regurgitation. No evidence of mitral valve stenosis. Tricuspid Valve: The tricuspid valve is grossly normal. Tricuspid valve regurgitation is mild . No evidence of tricuspid stenosis. Aortic Valve: The aortic valve is normal in structure. Aortic valve regurgitation is not visualized. No aortic stenosis is present. Pulmonic Valve: The pulmonic valve was normal in structure. Pulmonic valve regurgitation is not visualized. No evidence of pulmonic stenosis. Aorta: The aortic root and ascending aorta are structurally normal, with no evidence of dilitation. IAS/Shunts: The atrial septum is grossly normal.  LEFT VENTRICLE PLAX 2D LVIDd:         3.80 cm LVIDs:         2.80 cm LV PW:         1.00 cm LV IVS:        1.00 cm LVOT diam:     2.00 cm LVOT Area:     3.14 cm  RIGHT VENTRICLE             IVC RV S prime:     13.70 cm/s  IVC diam: 1.80 cm LEFT ATRIUM             Index       RIGHT ATRIUM  Index LA diam:        3.50 cm 2.01 cm/m  RA Area:     14.80 cm LA Vol (A2C):   42.1 ml 24.12 ml/m RA Volume:   34.30 ml  19.65 ml/m LA Vol (A4C):   40.3 ml 23.09 ml/m LA Biplane Vol: 44.0 ml 25.21 ml/m   AORTA Ao Root diam: 2.90 cm TRICUSPID VALVE TR Peak grad:   32.7 mmHg TR Vmax:        286.00 cm/s  SHUNTS Systemic Diam: 2.00 cm Mertie Moores MD Electronically signed by Mertie Moores MD Signature Date/Time: 05/06/2020/5:20:02 PM    Final     Medications:   . docusate sodium  100 mg Oral BID  . gabapentin  100 mg Oral QHS  . insulin aspart  0-15 Units Subcutaneous TID WC  . insulin aspart  0-5 Units Subcutaneous QHS  . metoprolol tartrate  50 mg Oral BID  . nicotine  14 mg Transdermal Daily  . pantoprazole   40 mg Oral BID  . pravastatin  20 mg Oral QHS  . rivaroxaban  20 mg Oral QHS  . sertraline  50 mg Oral Daily  . sucralfate  1 g Oral TID WC & HS   Continuous Infusions: . sodium chloride 50 mL/hr at 05/08/20 0118  . azithromycin 500 mg (05/07/20 0909)  . cefTRIAXone (ROCEPHIN)  IV 2 g (05/07/20 0821)     LOS: 2 days   Geradine Girt  [facility]   How to contact the [facility] Attending or Consulting provider [place] or covering provider during after hours [place], for this patient?  1. Check the care team in [facility] and look for a) attending/consulting TR[facility] provider listed and b) the [facility] team listed 2. Log into [email or web address] and use [place]'s universal password to access. If you do not have the password, please contact the hospital operator. 3. Locate the [facility] provider you are looking for under [facility] and page to a number that you can be directly reached. 4. If you still have difficulty reaching the provider, please page the [facility] (Director on Call) for the Hospitalists listed on amion for assistance.  05/08/2020, 8:57 AM

## 2020-05-08 NOTE — Telephone Encounter (Signed)
Left message for pt to call.

## 2020-05-08 NOTE — Progress Notes (Signed)
Inpatient Diabetes Program Recommendations  AACE/ADA: New Consensus Statement on Inpatient Glycemic Control (2015)  Target Ranges:  Prepandial:   less than 140 mg/dL      Peak postprandial:   less than 180 mg/dL (1-2 hours)      Critically ill patients:  140 - 180 mg/dL   Lab Results  Component Value Date   GLUCAP 159 (H) 05/08/2020   HGBA1C 6.8 (H) 05/06/2020    Review of Glycemic Control  Diabetes history: DM 2 Outpatient Diabetes medications: Lantus 28 units Daily Current orders for Inpatient glycemic control:  Novolog 0-15 units tid + hs  Inpatient Diabetes Program Recommendations:    Pt reports feeling SOB and having nausea. Asked ot if she has received medication for it she says she has. Pt requests to see her Cardiologist. Reviewed Dr. Gaynelle Adu note. Advised pt cardiology should be seeing her while here.  Spoke with pt at bedside regarding A1c and her not taking Lantus in approx 1 week prior to admission. Pt reports she was not eating and did not take her Lantus. Spoke with pt about checking glucose at home.   Recommend d/c Lantus at time of d/c and have pt check glucose 2 times a day. If glucose trends increase call her PCP if needed. Due to age and if pt lives by herself goal A1c is higher for this pt.   Pt only received Novolog 2 units yesterday.  Thanks,  Tama Headings RN, MSN, BC-ADM Inpatient Diabetes Coordinator Team Pager [phone] (8a-5p)

## 2020-05-08 NOTE — Progress Notes (Signed)
OT Cancellation Note  Patient Details Name: Kathleen Griffith MRN: [id number] DOB: 1941-01-04   Cancelled Treatment:    Reason Eval/Treat Not Completed: Patient declined, no reason specified. Pt sitting EOB upon OT arrival and continued to refuse OT/therapy evals. Pt stated. "I'm too tired and sick to do therapy and until the cardiologist tells me to do it, I won't). OT educated pt on benefits/importance of of participation, however pt continued to refuse. Pt's RN notified.  Emmit Alexanders [facility] 05/08/2020, 11:16 AM

## 2020-05-08 NOTE — Progress Notes (Signed)
PT Cancellation Note  Patient Details Name: Kathleen Griffith MRN: [id number] DOB: February 02, 1941   Cancelled Treatment:    Reason Eval/Treat Not Completed: Patient declined, no reason specified. Patient states she will not work with therapy until her cardiologist approves. Will continue to monitor and evaluate when able.    Makaveli Hoard 05/08/2020, 12:40 PM

## 2020-05-08 NOTE — Telephone Encounter (Signed)
App eval Kathleen Griffith

## 2020-05-08 NOTE — Progress Notes (Signed)
Rounded on the patient and she stated that she still does not feel well feels like she is " dying" PT came in to try and work with her and she refused again stated until Cardiology comes and see's her she will not work with them. MD made aware

## 2020-05-09 ENCOUNTER — Inpatient Hospital Stay (HOSPITAL_COMMUNITY): Payer: Medicare HMO

## 2020-05-09 LAB — EXPECTORATED SPUTUM ASSESSMENT W GRAM STAIN, RFLX TO RESP C

## 2020-05-09 LAB — PROCALCITONIN: Procalcitonin: 0.12 ng/mL

## 2020-05-09 LAB — GLUCOSE, CAPILLARY
Glucose-Capillary: 141 mg/dL — ABNORMAL HIGH (ref 70–99)
Glucose-Capillary: 150 mg/dL — ABNORMAL HIGH (ref 70–99)
Glucose-Capillary: 120 mg/dL — ABNORMAL HIGH (ref 70–99)
Glucose-Capillary: 109 mg/dL — ABNORMAL HIGH (ref 70–99)

## 2020-05-09 LAB — BRAIN NATRIURETIC PEPTIDE: B Natriuretic Peptide: 1775.3 pg/mL — ABNORMAL HIGH (ref 0.0–100.0)

## 2020-05-09 MED ORDER — FUROSEMIDE 10 MG/ML IJ SOLN
20.0000 mg | Freq: Once | INTRAMUSCULAR | Status: AC
  Administered 2020-05-09: 20 mg via INTRAVENOUS
  Filled 2020-05-09: qty 2

## 2020-05-09 NOTE — Progress Notes (Addendum)
Progress Note    Kathleen Griffith  [name]:[id number] DOB: [birth year]  DOA: 05/06/2020 PCP: No primary care provider on file.    Brief Narrative:     Medical records reviewed and are as summarized below:  Kathleen Griffith is an 79 y.o. female with medical history significant of PVD: PMR; HTN; HLD; DM with gastroparesis; fibromyalgia; and afib presenting with refractory PNA.  She reports that she was doing fine.  She flew to [place] to visit a friend.  The day after returning home, she was in afib (hasn't been in that in over a year).  She fell the night before she came home for the first time in over a year.  Things just went from bad to worse.  She fell at home 5 days later.  She has no energy, takes everything she can do to do anything.  She gets up on the side of the chair or bed and has to sit there for an hour or more to get the energy to get up.  She can't fix meals.  She drinks water.  Yesterday, she ate one piece of toast all day.  Not really SOB, only with conversation or moving around.  +cough, productive of greenish sputum.  No fever.  She saw Dr. Coletta Memos on 5/5 and he sent her for CXR and ankle xray (from fall); he told her he thought she had atypical PNA and called in azithromycin.   Assessment/Plan:   Principal Problem:   CAP (community acquired pneumonia) Active Problems:   Tobacco abuse   Essential hypertension   Hyperlipidemia   Atrial fibrillation with RVR (HCC)   DNR (do not resuscitate)   Severe right upper lobe pneumonia -Given productive cough and infiltrate in right upper lobe on chest x-ray -Thought to be due to community-acquired bacteria but patient has not improved on several days of IV azithromycin and Rocephin--- pending results of x ray will broaden antibiotics and consider pulmonary consult for possible bronchoscopy to rule out postobstructive pneumonia pending response -We will repeat chest x-ray today -We will also attempt to get culture of  sputum -Influenza negative. -COVID-19 negative. -CTA: Extensive right upper lobe airspace opacities suggesting pneumonia. Followup PA and lateral chest X-ray is recommended in 3-4 weeks following trial of antibiotic therapy to ensure resolution and exclude underlying malignancy. (patient informed of need for repeat x ray on 5/9) -pro calcitonin elevated--check again - albuterol PRN  DM- type 2 -A1c 6.8 -Appears to have erratic use of Lantus (last used sometime last week) -SSI -Diabetes coordinator consult appreciated  HTN -Continue Lopressor  HLD -Continue Pravachol  Afib - parox -upon admission rate controlled at rest but clearly uncontrolled with minimal exertion; this may be related to recent noncompliance with Lopressor  -She had Takotsubo cardiomyopathy in 2018 - EF 25% with grade 2 diastolic dysfunction in June and improved to 55-60% with grade 1 DD in August-- echo this admission is similar -Continue Xarelto -have titrated rate controlling medications -Appreciate cardiology consultation  Tobacco dependence -Encourage cessation.   -Patch ordered  -Continue prn Albuterol   ADDENDUM X ray appears to have some pulm edema- IV lasix given with improvement of symptoms   Family Communication/Anticipated D/C date and plan/Code Status   DVT prophylaxis: Lovenox ordered. Code Status: DNR Disposition Plan: Status is: Inpatient  Remains inpatient appropriate because:Inpatient level of care appropriate due to severity of illness   Dispo: The patient is from: Home  Anticipated d/c is to: Home              Anticipated d/c date is: 2 days              Patient currently is not medically stable to d/c. -- continues with a fib with RVR and does not tolerate well-- cardiology consult    Medical Consultants:    cards   Subjective:   Continues to not feel well Productive cough  Objective:    Vitals:   05/08/20 2355 05/09/20 0320 05/09/20 0324  05/09/20 0810  BP:  135/86 (!) 147/97 133/89  Pulse:  (!) 141 76 86  Resp:  18 18   Temp:  (!) 97.4 F (36.3 C)    TempSrc:  Oral    SpO2: 92% 93% 94%   Weight:      Height:        Intake/Output Summary (Last 24 hours) at 05/09/2020 0913 Last data filed at 05/08/2020 1857 Gross per 24 hour  Intake 410 ml  Output 1200 ml  Net -790 ml   Filed Weights   05/06/20 1539  Weight: 71.3 kg    Exam:  General: Appearance:     Frail female in no acute distress     Lungs:    coarse breath sounds, mild increase work of breathing  Heart:    Normal heart rate. Irregularly irregular rhythm. No murmurs, rubs, or gallops.   MS:   All extremities are intact.   Neurologic:   Awake, alert, oriented x 3. No apparent focal neurological           defect.      Data Reviewed:   I have personally reviewed following labs and imaging studies:  Labs: Labs show the following:   Basic Metabolic Panel: Recent Labs  Lab 05/06/20 0912 05/06/20 0912 05/07/20 0556 05/08/20 1049  NA 136  --  138 137  K 3.6   < > 3.3* 4.0  CL 98  --  105 105  CO2 28  --  24 23  GLUCOSE 146*  --  93 178*  BUN 40*  --  23 18  CREATININE 1.16*  --  1.01* 1.05*  CALCIUM 8.9  --  8.3* 8.4*  MG  --   --   --  1.7   < > = values in this interval not displayed.   GFR Estimated Creatinine Clearance: 41.2 mL/min (A) (by C-G formula based on SCr of 1.05 mg/dL (H)). Liver Function Tests: Recent Labs  Lab 05/06/20 0912  AST 15  ALT 14  ALKPHOS 88  BILITOT 0.8  PROT 6.6  ALBUMIN 2.2*   No results for input(s): LIPASE, AMYLASE in the last 168 hours. No results for input(s): AMMONIA in the last 168 hours. Coagulation profile Recent Labs  Lab 05/06/20 0912  INR 1.6*    CBC: Recent Labs  Lab 05/06/20 0912 05/07/20 0556 05/08/20 1049  WBC 9.4 8.4 9.5  NEUTROABS 6.7 6.2  --   HGB 12.9 11.7* 12.0  HCT 41.1 36.9 38.9  MCV 91.5 91.3 92.6  PLT 373 324 328   Cardiac Enzymes: No results for input(s):  CKTOTAL, CKMB, CKMBINDEX, TROPONINI in the last 168 hours. BNP (last 3 results) No results for input(s): PROBNP in the last 8760 hours. CBG: Recent Labs  Lab 05/08/20 0630 05/08/20 1139 05/08/20 1622 05/08/20 2041 05/09/20 0603  GLUCAP 133* 159* 94 174* 141*   D-Dimer: No results for input(s): DDIMER in the last 72  hours. Hgb A1c: Recent Labs    05/06/20 1450  HGBA1C 6.8*   Lipid Profile: No results for input(s): CHOL, HDL, LDLCALC, TRIG, CHOLHDL, LDLDIRECT in the last 72 hours. Thyroid function studies: No results for input(s): TSH, T4TOTAL, T3FREE, THYROIDAB in the last 72 hours.  Invalid input(s): FREET3 Anemia work up: No results for input(s): VITAMINB12, FOLATE, FERRITIN, TIBC, IRON, RETICCTPCT in the last 72 hours. Sepsis Labs: Recent Labs  Lab 05/06/20 0912 05/06/20 1337 05/07/20 0556 05/08/20 1049  PROCALCITON  --  0.95  --   --   WBC 9.4  --  8.4 9.5  LATICACIDVEN 1.1  --   --   --     Microbiology Recent Results (from the past 240 hour(s))  Culture, blood (x 2)     Status: None (Preliminary result)   Collection Time: 05/06/20  9:05 AM   Specimen: BLOOD  Result Value Ref Range Status   Specimen Description BLOOD RIGHT ANTECUBITAL  Final   Special Requests   Final    BOTTLES DRAWN AEROBIC AND ANAEROBIC Blood Culture adequate volume   Culture   Final    NO GROWTH 3 DAYS Performed at [facility], [address] [place], [place] [address]    Report Status PENDING  Incomplete  Culture, blood (x 2)     Status: None (Preliminary result)   Collection Time: 05/06/20  9:10 AM   Specimen: BLOOD RIGHT WRIST  Result Value Ref Range Status   Specimen Description BLOOD RIGHT WRIST  Final   Special Requests   Final    BOTTLES DRAWN AEROBIC AND ANAEROBIC Blood Culture adequate volume   Culture   Final    NO GROWTH 3 DAYS Performed at [facility], [place] [address], [place], [place] [address]    Report Status PENDING  Incomplete  Respiratory Panel  by RT PCR (Flu A&B, Covid) - Nasopharyngeal Swab     Status: None   Collection Time: 05/06/20  9:28 AM   Specimen: Nasopharyngeal Swab  Result Value Ref Range Status   SARS Coronavirus 2 by RT PCR NEGATIVE NEGATIVE Final    Comment: (NOTE) SARS-CoV-2 target nucleic acids are NOT DETECTED. The SARS-CoV-2 RNA is generally detectable in upper respiratoy specimens during the acute phase of infection. The lowest concentration of SARS-CoV-2 viral copies this assay can detect is 131 copies/mL. A negative result does not preclude SARS-Cov-2 infection and should not be used as the sole basis for treatment or other patient management decisions. A negative result may occur with  improper specimen collection/handling, submission of specimen other than nasopharyngeal swab, presence of viral mutation(s) within the areas targeted by this assay, and inadequate number of viral copies (<131 copies/mL). A negative result must be combined with clinical observations, patient history, and epidemiological information. The expected result is Negative. Fact Sheet for Patients:  PinkCheek.be Fact Sheet for Healthcare Providers:  GravelBags.it This test is not yet ap proved or cleared by the [place] FDA and  has been authorized for detection and/or diagnosis of SARS-CoV-2 by FDA under an Emergency Use Authorization (EUA). This EUA will remain  in effect (meaning this test can be used) for the duration of the COVID-19 declaration under Section 564(b)(1) of the Act, 21 U.S.C. section 360bbb-3(b)(1), unless the authorization is terminated or revoked sooner.    Influenza A by PCR NEGATIVE NEGATIVE Final   Influenza B by PCR NEGATIVE NEGATIVE Final    Comment: (NOTE) The Xpert Xpress SARS-CoV-2/FLU/RSV assay is intended  as an aid in  the diagnosis of influenza from Nasopharyngeal swab specimens and  should not be used as a sole basis for treatment.  Nasal washings and  aspirates are unacceptable for Xpert Xpress SARS-CoV-2/FLU/RSV  testing. Fact Sheet for Patients: PinkCheek.be Fact Sheet for Healthcare Providers: GravelBags.it This test is not yet approved or cleared by the [place] FDA and  has been authorized for detection and/or diagnosis of SARS-CoV-2 by  FDA under an Emergency Use Authorization (EUA). This EUA will remain  in effect (meaning this test can be used) for the duration of the  Covid-19 declaration under Section 564(b)(1) of the Act, 21  U.S.C. section 360bbb-3(b)(1), unless the authorization is  terminated or revoked. Performed at [facility], [place] [address] [place],  [address]     Procedures and diagnostic studies:  No results found.  Medications:   . docusate sodium  100 mg Oral BID  . gabapentin  100 mg Oral QHS  . insulin aspart  0-15 Units Subcutaneous TID WC  . insulin aspart  0-5 Units Subcutaneous QHS  . metoprolol tartrate  50 mg Oral BID  . nicotine  14 mg Transdermal Daily  . pantoprazole  40 mg Oral BID  . pravastatin  20 mg Oral QHS  . rivaroxaban  20 mg Oral QHS  . sertraline  50 mg Oral Daily  . sucralfate  1 g Oral TID WC & HS   Continuous Infusions: . azithromycin Stopped (05/08/20 1116)  . cefTRIAXone (ROCEPHIN)  IV Stopped (05/08/20 1001)     LOS: 3 days   Geradine Girt  [facility]   How to contact the [facility] Attending or Consulting provider [place] or covering provider during after hours [place], for this patient?  1. Check the care team in [facility] and look for a) attending/consulting TRH provider listed and b) the [facility] team listed 2. Log into [email or web address] and use [place]'s universal password to access. If you do not have the password, please contact the hospital operator. 3. Locate the [facility] provider you are looking for under [facility] and page to a number that you can be directly  reached. 4. If you still have difficulty reaching the provider, please page the [facility] (Director on Call) for the Hospitalists listed on amion for assistance.  05/09/2020, 9:13 AM

## 2020-05-09 NOTE — TOC Initial Note (Addendum)
Transition of Care [facility]) - Initial/Assessment Note    Patient Details  Name: Kathleen Griffith MRN: [id number] Date of Birth: 05-Dec-1941  Transition of [facility] CM/SW Contact:    Marilu Favre, RN Phone Number: 05/09/2020, 2:15 PM  Clinical Narrative:                 Patient from home alone , son has an apartment across the hall from her and assists her. Provided medicare.gov list, patient wants [facility].   Patient has a transport chair and a rollator at home already.  Expected Discharge Plan: [place] Barriers to Discharge: Continued Medical Work up   Patient Goals and CMS Choice Patient states their goals for this hospitalization and ongoing recovery are:: to return to home CMS Medicare.gov Compare Post Acute Care list provided to:: Patient Choice offered to / list presented to : Patient  Expected Discharge Plan and Services Expected Discharge Plan: [place]   Discharge Planning Services: CM Consult Post Acute Care Choice: [place] arrangements for the past 2 months: Apartment                 DME Arranged: N/A         HH Arranged: PT HH Agency: [place] Date [place]: 05/09/20 Time [place]: [id number] Representative spoke with at [place]: Tommi Rumps awaiting call back  Prior Living Arrangements/Services Living arrangements for the past 2 months: Apartment Lives with:: Self Patient language and need for interpreter reviewed:: Yes Do you feel safe going back to the place where you live?: Yes      Need for Family Participation in Patient Care: Yes (Comment) Care giver support system in place?: Yes (comment) Current home services: DME Criminal Activity/Legal Involvement Pertinent to Current Situation/Hospitalization: No - Comment as needed  Activities of Daily Living Home Assistive Devices/Equipment: Environmental consultant (specify type), Cane (specify quad or straight) ADL Screening (condition at time  of admission) Patient's cognitive ability adequate to safely complete daily activities?: Yes Is the patient deaf or have difficulty hearing?: No Does the patient have difficulty seeing, even when wearing glasses/contacts?: No Does the patient have difficulty concentrating, remembering, or making decisions?: No Patient able to express need for assistance with ADLs?: Yes Does the patient have difficulty dressing or bathing?: No Independently performs ADLs?: Yes (appropriate for developmental age) Does the patient have difficulty walking or climbing stairs?: Yes Weakness of Legs: Both Weakness of Arms/Hands: None  Permission Sought/Granted   Permission granted to share information with : No              Emotional Assessment Appearance:: Appears stated age Attitude/Demeanor/Rapport: Engaged Affect (typically observed): Accepting Orientation: : Oriented to Place, Oriented to  Time, Oriented to Situation, Oriented to Self Alcohol / Substance Use: Not Applicable Psych Involvement: No (comment)  Admission diagnosis:  CAP (community acquired pneumonia) [J18.9] Sepsis ([place]) [A41.9] Community acquired pneumonia of right upper lobe of lung [J18.9] Patient Active Problem List   Diagnosis Date Noted  . DNR (do not resuscitate) 05/07/2020  . CAP (community acquired pneumonia) 05/06/2020  . 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 Allergic rhinitis due to pollen 10/19/2019  . Pruritus 06/17/2019  . Other fatigue 02/24/2019  . Melanoma ([place]) 12/10/2018  . Atrial fibrillation with RVR ([place]) 09/16/2018  . Depression 12/03/2017  . Normal coronary arteries 12/03/2017  . 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 Polymyalgia rheumatica ([place]) 12/03/2017  .  NSTEMI (non-ST elevated myocardial infarction) ([place]) 06/02/2017  . PAF (paroxysmal atrial fibrillation) ([place]) 03/20/2016  . Insulin dependent diabetes mellitus 03/20/2016  . GERD (gastroesophageal reflux disease)  03/20/2016  . Anemia, iron deficiency   . Angiodysplasia of cecum   . Colon polyp   . 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 Chronic anticoagulation 07/28/2015  . Family history of colon cancer 07/28/2015  . 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 Tobacco abuse 04/04/2015  . Essential hypertension 04/04/2015  . Hyperlipidemia 04/04/2015   PCP:  No primary care provider on file. Pharmacy:   [id number] [address], [address] AT [facility] [place] [address] Phone: [phone] Fax: [phone]     Social Determinants of Health ([place]) Interventions    Readmission Risk Interventions No flowsheet data found.

## 2020-05-09 NOTE — Discharge Instructions (Addendum)
Will need repeat chest x ray in 4 weeks to ensure pneumonia has resolved   Community-Acquired Pneumonia, Adult Pneumonia is a type of lung infection that causes swelling in the airways of the lungs. Mucus and fluid may also build up inside the airways. This may cause coughing and difficulty breathing. There are different types of pneumonia. One type can develop while a person is in a hospital. A different type is called community-acquired pneumonia. It develops in people who are not, and have not recently been, in the hospital or another type of health care facility. What are the causes? This condition may be caused by:  Viruses. This is the most common cause of pneumonia.  Bacteria. Community-acquired pneumonia is often caused by Streptococcus pneumoniae bacteria. These bacteria are often passed from one person to another by breathing in droplets from the cough or sneeze of an infected person.  Fungi. This is the least common cause of pneumonia. What increases the risk? The following factors may make you more likely to develop this condition:  Having a chronic disease, such as chronic obstructive pulmonary disease (COPD), asthma, congestive heart failure, cystic fibrosis, diabetes, or kidney disease.  Having early-stage or late-stage HIV.  Having sickle cell disease.  Having had your spleen removed (splenectomy).  Having poor dental hygiene.  Having a medical condition that increases the risk of breathing in (aspirating) secretions from your own mouth and nose.  Having a weakened body defense system (immune system).  Being a smoker.  Traveling to areas where pneumonia-causing germs commonly exist.  Being around animal habitats or animals that have pneumonia-causing germs, including birds, bats, rabbits, cats, and farm animals. What are the signs or symptoms? Symptoms of this condition include:  A dry cough.  A wet (productive) cough.  Fever.  Sweating.  Chest pain,  especially when breathing deeply or coughing.  Rapid breathing or difficulty breathing.  Shortness of breath.  Shaking chills.  Fatigue.  Muscle aches. How is this diagnosed? This condition may be diagnosed based on:  Your medical history.  A physical exam. You may also have tests, including:  Chest X-rays.  Tests of your blood oxygen level and other blood gases.  Tests on blood, mucus (sputum), fluid around your lungs (pleural fluid), and urine. If your pneumonia is severe, other tests may be done to find the exact cause of your illness. How is this treated? Treatment for this condition depends on many factors, such as the cause of your pneumonia, the medicines you take, and other medical conditions that you have. For most adults, treatment and recovery from pneumonia may occur at home. In some cases, treatment must happen in a hospital. Treatment may include:  Medicines that are given by mouth or through an IV, including: ? Antibiotic medicines, if the pneumonia was caused by bacteria. ? Antiviral medicines, if the pneumonia was caused by a virus.  Being given extra oxygen.  Respiratory therapy. Although rare, treating severe pneumonia may include:  Using a machine to help you breathe (mechanical ventilation). This is done if you are not breathing well on your own and you cannot maintain a safe blood oxygen level.  Thoracentesis. This is a procedure to remove fluid from around one lung or both lungs to help you breathe better. Follow these instructions at home:  Medicines  Take over-the-counter and prescription medicines only as told by your health care provider. ? Only take cough medicine if you are losing sleep. Be aware that cough medicine can prevent your  body's natural ability to remove mucus from your lungs.  If you were prescribed an antibiotic medicine, take it as told by your health care provider. Do not stop taking the antibiotic even if you start to feel  better. General instructions  Sleep in a semi-upright position at night. Try sleeping in a reclining chair, or place a few pillows under your head.  Rest as needed and get at least 8 hours of sleep each night.  Drink enough water to keep your urine pale yellow. This will help to thin out mucus secretions in your lungs.  Eat a healthy diet that includes plenty of vegetables, fruits, whole grains, low-fat dairy products, and lean protein.  Do not use any products that contain nicotine or tobacco, such as cigarettes, e-cigarettes, and chewing tobacco. If you need help quitting, ask your health care provider.  Keep all follow-up visits as told by your health care provider. This is important. How is this prevented? You can lower your risk of developing community-acquired pneumonia by:  Getting a pneumococcal vaccine. There are different types and schedules of pneumococcal vaccines. Ask your health care provider which option is best for you. Consider getting the vaccine if: ? You are older than 79 years of age. ? You are older than 79 years of age and are undergoing cancer treatment, have chronic lung disease, or have other medical conditions that affect your immune system. Ask your health care provider if this applies to you.  Getting an influenza vaccine every year. Ask your health care provider which type of vaccine is best for you.  Getting regular checkups from your dentist.  Washing your hands often. If soap and water are not available, use hand sanitizer. Contact a health care provider if:  You have a fever.  You are losing sleep because you cannot control your cough with cough medicine. Get help right away if:  You have worsening shortness of breath.  You have increased chest pain.  Your sickness becomes worse, especially if you are an older adult or have a weakened immune system.  You cough up blood. Summary  Pneumonia is an infection of the lungs.  Community-acquired  pneumonia develops in people who have not been in the hospital. It can be caused by bacteria, viruses, or fungi.  This condition may be treated with antibiotics or antiviral medicines.  Severe cases may require hospitalization, mechanical ventilation, and other procedures to drain fluid from the lungs. This information is not intended to replace advice given to you by your health care provider. Make sure you discuss any questions you have with your health care provider. Document Revised: 08/13/2018 Document Reviewed: 08/13/2018 Elsevier Patient Education  [employer] on my medicine - XARELTO (Rivaroxaban)  Why was Xarelto prescribed for you? Xarelto was prescribed for you to reduce the risk of a blood clot forming that can cause a stroke if you have a medical condition called atrial fibrillation (a type of irregular heartbeat).  What do you need to know about xarelto ? Take your Xarelto ONCE DAILY at the same time every day with your evening meal. If you have difficulty swallowing the tablet whole, you may crush it and mix in applesauce just prior to taking your dose.  Take Xarelto exactly as prescribed by your doctor and DO NOT stop taking Xarelto without talking to the doctor who prescribed the medication.  Stopping without other stroke prevention medication to take the place of Xarelto may increase your risk of  developing a clot that causes a stroke.  Refill your prescription before you run out.  After discharge, you should have regular check-up appointments with your healthcare provider that is prescribing your Xarelto.  In the future your dose may need to be changed if your kidney function or weight changes by a significant amount.  What do you do if you miss a dose? If you are taking Xarelto ONCE DAILY and you miss a dose, take it as soon as you remember on the same day then continue your regularly scheduled once daily regimen the next day. Do not take  two doses of Xarelto at the same time or on the same day.   Important Safety Information A possible side effect of Xarelto is bleeding. You should call your healthcare provider right away if you experience any of the following: ? Bleeding from an injury or your nose that does not stop. ? Unusual colored urine (red or dark brown) or unusual colored stools (red or black). ? Unusual bruising for unknown reasons. ? A serious fall or if you hit your head (even if there is no bleeding).  Some medicines may interact with Xarelto and might increase your risk of bleeding while on Xarelto. To help avoid this, consult your healthcare provider or pharmacist prior to using any new prescription or non-prescription medications, including herbals, vitamins, non-steroidal anti-inflammatory drugs (NSAIDs) and supplements.  This website has more information on Xarelto: [email or web address]

## 2020-05-09 NOTE — Progress Notes (Signed)
Progress Note  Patient Name: Kathleen Griffith Date of Encounter: 05/09/2020  Primary Cardiologist: Kirk Ruths, MD   Subjective   Weak; no CPl; productive cough  Inpatient Medications    Scheduled Meds: . docusate sodium  100 mg Oral BID  . gabapentin  100 mg Oral QHS  . insulin aspart  0-15 Units Subcutaneous TID WC  . insulin aspart  0-5 Units Subcutaneous QHS  . metoprolol tartrate  50 mg Oral BID  . nicotine  14 mg Transdermal Daily  . pantoprazole  40 mg Oral BID  . pravastatin  20 mg Oral QHS  . rivaroxaban  20 mg Oral QHS  . sertraline  50 mg Oral Daily  . sucralfate  1 g Oral TID WC & HS   Continuous Infusions: . azithromycin Stopped (05/08/20 1116)  . cefTRIAXone (ROCEPHIN)  IV Stopped (05/08/20 1001)   PRN Meds: acetaminophen **OR** acetaminophen, albuterol, bisacodyl, HYDROcodone-acetaminophen, morphine injection, ondansetron **OR** ondansetron (ZOFRAN) IV, polyethylene glycol, zolpidem   Vital Signs    Vitals:   05/08/20 2355 05/09/20 0320 05/09/20 0324 05/09/20 0810  BP:  135/86 (!) 147/97 133/89  Pulse:  (!) 141 76 86  Resp:  18 18   Temp:  (!) 97.4 F (36.3 C)    TempSrc:  Oral    SpO2: 92% 93% 94%   Weight:      Height:        Intake/Output Summary (Last 24 hours) at 05/09/2020 0859 Last data filed at 05/08/2020 1857 Gross per 24 hour  Intake 410 ml  Output 1200 ml  Net -790 ml   Last 3 Weights 05/06/2020 12/30/2019 11/15/2019  Weight (lbs) 157 lb 3 oz 156 lb 4 oz 158 lb  Weight (kg) 71.3 kg 70.875 kg 71.668 kg      Telemetry    Atrial fibrillation; rate elevated- Personally Reviewed   Physical Exam   GEN: No acute distress.   Neck: No JVD Cardiac: irregular and tachycardic Respiratory: Diminished BS throughout GI: Soft, nontender, non-distended  MS: No edema Neuro:  Nonfocal  Psych: Normal affect   Labs      Chemistry Recent Labs  Lab 05/06/20 0912 05/07/20 0556 05/08/20 1049  NA 136 138 137  K 3.6 3.3* 4.0  CL  98 105 105  CO2 28 24 23   GLUCOSE 146* 93 178*  BUN 40* 23 18  CREATININE 1.16* 1.01* 1.05*  CALCIUM 8.9 8.3* 8.4*  PROT 6.6  --   --   ALBUMIN 2.2*  --   --   AST 15  --   --   ALT 14  --   --   ALKPHOS 88  --   --   BILITOT 0.8  --   --   GFRNONAA 45* 53* 50*  GFRAA 52* >60 58*  ANIONGAP 10 9 9      Hematology Recent Labs  Lab 05/06/20 0912 05/07/20 0556 05/08/20 1049  WBC 9.4 8.4 9.5  RBC 4.49 4.04 4.20  HGB 12.9 11.7* 12.0  HCT 41.1 36.9 38.9  MCV 91.5 91.3 92.6  MCH 28.7 29.0 28.6  MCHC 31.4 31.7 30.8  RDW 14.3 14.4 14.5  PLT 373 324 328    BNP Recent Labs  Lab 05/06/20 1000  BNP 742.9*     Patient Profile     79 y.o. female with a hx PAF, COPD, PVD, PMR, hypertension, hyperlipidemia, DM, fibromyalgia of  who is being seen today for the evaluation of persistent atrial fibrillation. Admitted with pneumonia;  echo shows normal LV function.  Assessment & Plan    1. Persistent atrial fibrillation-atrial fibrillation likely precipitated by pneumonia.  Echo shows normal LV function.  Will repeat TSH.  Continue metoprolol for rate control (reasonable at present and will follow on telemetry).  Continue Xarelto (CHADSvasc 6).  Would continue therapy for pneumonia which is likely driving atrial fibrillation.  Once it is treated if atrial fibrillation persists could proceed with cardioversion as she has missed no doses of Xarelto. 2. Pneumonia-Management per primary care; agree with broadening antibiotics. May need pulmonary eval. 3. Hypertension-controlled; continue present blood pressure medications and follow. 4. Tobacco abuse-patient states she discontinued 3 weeks ago.  For questions or updates, please contact [facility] Please consult [email or web address] for contact info under        Signed, Kirk Ruths, MD  05/09/2020, 8:59 AM

## 2020-05-09 NOTE — Evaluation (Signed)
Physical Therapy Evaluation Patient Details Name: Kathleen Griffith MRN: [id number] DOB: 05-02-1941 Today's Date: 05/09/2020   History of Present Illness  79 y.o. female with a hx PAF, COPD, PVD, PMR, hypertension, hyperlipidemia, DM, fibromyalgia admitted to [facility] on 5/8 with PNA, recent falls, afib.  Clinical Impression   Pt presents with generalized weakness, dyspnea on exertion on 0.5 L supplemental O2, unsteadiness in standing with history of recent falls, and decreased activity tolerance. Pt to benefit from acute PT to address deficits. Pt required up to min +2 assist for mobility this day, limited to bed mobility and semi-stand only due to pt fatigue and pt declining to ambulate. At baseline, pt is independent with use of rollator. PT recommending HHPT to address deficits upon d/c from acute setting, pt agreeable. PT to progress mobility as tolerated, and will continue to follow acutely.      Follow Up Recommendations Home health PT;Supervision for mobility/OOB    Equipment Recommendations  None recommended by PT    Recommendations for Other Services       Precautions / Restrictions Precautions Precautions: Fall Restrictions Weight Bearing Restrictions: No      Mobility  Bed Mobility Overal bed mobility: Needs Assistance Bed Mobility: Supine to Sit;Sit to Supine;Rolling     Supine to sit: HOB elevated;Min guard Sit to supine: HOB elevated;Min assist   General bed mobility comments: Min guard for safety for supine to sit/rolling, min-mod assist for return to supine for positioning in bed and scooting up with bed pad. Increased time and effort.  Transfers Overall transfer level: Needs assistance   Transfers: Squat Pivot Transfers     Squat pivot transfers: Min assist;+2 safety/equipment;From elevated surface     General transfer comment: light min +2 for semi-stand and pivot towards HOB for initial power up, steadying. Pt declined coming to full stand or ambulation  attempt.  Ambulation/Gait             General Gait Details: pt declined  Stairs            Wheelchair Mobility    Modified Rankin (Stroke Patients Only)       Balance Overall balance assessment: Needs assistance;History of Falls Sitting-balance support: No upper extremity supported;Feet supported Sitting balance-Leahy Scale: Fair Sitting balance - Comments: sat EOB x10 minutes for ADLs, for back comfort without external assist   Standing balance support: Bilateral upper extremity supported Standing balance-Leahy Scale: Poor Standing balance comment: reliant on external assist                             Pertinent Vitals/Pain Pain Assessment: 0-10 Pain Score: 4  Pain Location: back Pain Descriptors / Indicators: Aching;Discomfort Pain Intervention(s): Limited activity within patient's tolerance;Monitored during session;Repositioned    Home Living Family/patient expects to be discharged to:: Private residence Living Arrangements: Alone Available Help at Discharge: Family;Available PRN/intermittently(son lives with, he works) Type of Home: House Home Access: Stairs to enter   [employer] of Steps: 3 (wide steps) Home Layout: One level Home Equipment: Grab bars - tub/shower;Bedside commode;Walker - 4 wheels      Prior Function Level of Independence: Independent with assistive device(s)         Comments: pt reports using rollator for ambulation, 2 recent falls     Hand Dominance   Dominant Hand: Right    Extremity/Trunk Assessment   Upper Extremity Assessment Upper Extremity Assessment: Defer to OT evaluation    Lower Extremity  Assessment Lower Extremity Assessment: Generalized weakness    Cervical / Trunk Assessment Cervical / Trunk Assessment: Normal  Communication   Communication: No difficulties  Cognition Arousal/Alertness: Awake/alert Behavior During Therapy: WFL for tasks assessed/performed Overall Cognitive  Status: Within Functional Limits for tasks assessed                                 General Comments: requires mod verbal encouragement to participate in PT/OT, but follows commands well and is cognitively appropriate during session.      General Comments General comments (skin integrity, edema, etc.): Hrmax 119 bpm, afib rhythm    Exercises Other Exercises Other Exercises: Pt education - discussed the importance of mobilizing to recliner with assist for RN staff for pulmonary health later today, going for CXR now   Assessment/Plan    PT Assessment Patient needs continued PT services  PT Problem List Decreased strength;Decreased mobility;Decreased safety awareness;Decreased activity tolerance;Decreased balance;Pain;Cardiopulmonary status limiting activity       PT Treatment Interventions DME instruction;Gait training;Therapeutic exercise;Patient/family education;Therapeutic activities;Balance training;Stair training;Functional mobility training;Neuromuscular re-education    PT Goals (Current goals can be found in the Care Plan section)  Acute Rehab PT Goals Patient Stated Goal: go home once I am better PT Goal Formulation: With patient Time For Goal Achievement: 05/23/20 Potential to Achieve Goals: Good    Frequency Min 3X/week   Barriers to discharge        Co-evaluation PT/OT/SLP Co-Evaluation/Treatment: Yes Reason for Co-Treatment: For patient/therapist safety;To address functional/ADL transfers;Other (comment)(poor activity tolerance) PT goals addressed during session: Mobility/safety with mobility         AM-PAC PT "6 Clicks" Mobility  Outcome Measure Help needed turning from your back to your side while in a flat bed without using bedrails?: A Little Help needed moving from lying on your back to sitting on the side of a flat bed without using bedrails?: A Little Help needed moving to and from a bed to a chair (including a wheelchair)?: A Little Help  needed standing up from a chair using your arms (e.g., wheelchair or bedside chair)?: A Little Help needed to walk in hospital room?: A Little Help needed climbing 3-5 steps with a railing? : A Lot 6 Click Score: 17    End of Session Equipment Utilized During Treatment: Oxygen Activity Tolerance: Patient limited by fatigue;Patient limited by pain Patient left: in bed;with call bell/phone within reach Nurse Communication: Mobility status PT Visit Diagnosis: Other abnormalities of gait and mobility (R26.89);Muscle weakness (generalized) (M62.81);History of falling (Z91.81)    Time: 8768-1157 PT Time Calculation (min) (ACUTE ONLY): 20 min   Charges:   PT Evaluation $PT Eval Low Complexity: 1 Low          Alexy Heldt E, PT Acute Rehabilitation Services Pager [phone]  Office [phone]   Lilu Mcglown D Pierson Vantol 05/09/2020, 11:15 AM

## 2020-05-09 NOTE — Evaluation (Signed)
Occupational Therapy Evaluation Patient Details Name: Kathleen Griffith MRN: [id number] DOB: 1941/07/17 Today's Date: 05/09/2020    History of Present Illness 79 y.o. female with a hx PAF, COPD, PVD, PMR, hypertension, hyperlipidemia, DM, fibromyalgia admitted to [facility] on 5/8 with PNA, recent falls, afib.   Clinical Impression   Pt presents with decline in function and safety with ADLs and ADL mobility with impaired strength, balance and endurance. Pt requires mod verbal encouragement to participate in PT/OT, but follows commands well and is cognitively appropriate during session. Pt lives at home with her son and was independent with ADLs/selfcare, home mgt, IADLs, was driving and used a Marketing executive for mobility. Pt currently requires min guard A for be mobility, min guard A with UB ADLs, mod A with LB ADLs, min A +2 for sit-stand/transfers. Pt declined walking to the bathroom. Pt would benefit from acute OT services to address impairments to maximize level of function and safety    Follow Up Recommendations  Home health OT    Equipment Recommendations  Other (comment)(reacher, LH bath sponge)    Recommendations for Other Services       Precautions / Restrictions Precautions Precautions: Fall Restrictions Weight Bearing Restrictions: No      Mobility Bed Mobility Overal bed mobility: Needs Assistance Bed Mobility: Supine to Sit;Sit to Supine;Rolling     Supine to sit: HOB elevated;Min guard Sit to supine: HOB elevated;Min assist   General bed mobility comments: Min guard for safety for supine to sit/rolling, min-mod assist for return to supine for positioning in bed and scooting up with bed pad. Increased time and effort.  Transfers Overall transfer level: Needs assistance   Transfers: Squat Pivot Transfers     Squat pivot transfers: Min assist;+2 safety/equipment;From elevated surface     General transfer comment: light min +2 for semi-stand and pivot towards HOB for  initial power up, steadying. Pt declined coming to full stand or ambulation attempt.    Balance Overall balance assessment: Needs assistance;History of Falls Sitting-balance support: No upper extremity supported;Feet supported Sitting balance-Leahy Scale: Fair Sitting balance - Comments: sat EOB x10 minutes for ADLs, for back comfort without external assist   Standing balance support: Bilateral upper extremity supported Standing balance-Leahy Scale: Poor Standing balance comment: reliant on external assist                           ADL either performed or assessed with clinical judgement   ADL Overall ADL's : Needs assistance/impaired Eating/Feeding: Independent;Sitting   Grooming: Wash/dry hands;Wash/dry face;Set up;Supervision/safety;Sitting   Upper Body Bathing: min guard A;Sitting   Lower Body Bathing: Moderate assistance Lower Body Bathing Details (indicate cue type and reason): (simulated sitting EOB)poor endurance, increased time and effort Upper Body Dressing : min guard A;Sitting   Lower Body Dressing: Moderate assistance   Toilet Transfer: Minimal assistance;+2 for safety/equipment;Stand-pivot Toilet Transfer Details (indicate cue type and reason): simulated to recliner. Pt states that she is able to walk to the bathroom and RN confirms           General ADL Comments: Pt only agreeable to minimal activity, refused walking to bathroom     Vision Patient Visual Report: No change from baseline       Perception     Praxis      Pertinent Vitals/Pain Pain Assessment: 0-10 Pain Score: 4  Pain Location: back Pain Descriptors / Indicators: Aching;Discomfort Pain Intervention(s): Limited activity within patient's tolerance;Monitored during session;Repositioned  Hand Dominance Right   Extremity/Trunk Assessment Upper Extremity Assessment Upper Extremity Assessment: Generalized weakness   Lower Extremity Assessment Lower Extremity Assessment:  Defer to PT evaluation   Cervical / Trunk Assessment Cervical / Trunk Assessment: Normal   Communication Communication Communication: No difficulties   Cognition Arousal/Alertness: Awake/alert Behavior During Therapy: WFL for tasks assessed/performed Overall Cognitive Status: Within Functional Limits for tasks assessed                                 General Comments: requires mod verbal encouragement to participate in PT/OT, but follows commands well and is cognitively appropriate during session.   General Comments  Hrmax 119 bpm, afib rhythm    Exercises Other Exercises Other Exercises: Pt education - discussed the importance of mobilizing to recliner with assist for RN staff for pulmonary health later today, going for CXR now   Shoulder Instructions      Home Living Family/patient expects to be discharged to:: Private residence Living Arrangements: Alone Available Help at Discharge: Family;Available PRN/intermittently Type of Home: House Home Access: Stairs to enter [employer] of Steps: 3 (wide steps)   Home Layout: One level     Bathroom Shower/Tub: Teacher, early years/pre: Handicapped height     Home Equipment: Grab bars - tub/shower;Bedside commode;Walker - 4 wheels          Prior Functioning/Environment Level of Independence: Independent with assistive device(s)    ADL's / Homemaking Assistance Needed: pt reports that she was independent with ADLs/selfcare, home mgt, IADLs, was driving   Comments: pt reports using rollator for ambulation, 2 recent falls        OT Problem List: Decreased strength;Impaired balance (sitting and/or standing);Decreased activity tolerance;Decreased coordination;Decreased knowledge of use of DME or AE      OT Treatment/Interventions:      OT Goals(Current goals can be found in the care plan section) Acute Rehab OT Goals Patient Stated Goal: go home once I am better OT Goal Formulation:  With patient Time For Goal Achievement: 05/23/20 Potential to Achieve Goals: Good ADL Goals Pt Will Perform Grooming: with min guard assist;with supervision;standing Pt Will Perform Upper Body Bathing: with supervision;with set-up;sitting Pt Will Perform Lower Body Bathing: with min assist Pt Will Perform Upper Body Dressing: with supervision;with set-up;sitting Pt Will Perform Lower Body Dressing: with min assist Pt Will Transfer to Toilet: with min guard assist;with supervision;ambulating;regular height toilet;bedside commode;grab bars Pt Will Perform Toileting - Clothing Manipulation and hygiene: with min guard assist;sit to/from stand Pt Will Perform Tub/Shower Transfer: with min assist;with min guard assist;tub bench;3 in 1  OT Frequency: Min 2X/week   Barriers to D/C:            Co-evaluation PT/OT/SLP Co-Evaluation/Treatment: Yes Reason for Co-Treatment: Complexity of the patient's impairments (multi-system involvement);For patient/therapist safety;Other (comment)(pt has Poor activity tolerance) PT goals addressed during session: Mobility/safety with mobility OT goals addressed during session: ADL's and self-care;Proper use of Adaptive equipment and DME      AM-PAC OT "6 Clicks" Daily Activity     Outcome Measure Help from another person eating meals?: None Help from another person taking care of personal grooming?: A Little Help from another person toileting, which includes using toliet, bedpan, or urinal?: A Lot Help from another person bathing (including washing, rinsing, drying)?: A Lot Help from another person to put on and taking off regular upper body clothing?: A Little Help from  another person to put on and taking off regular lower body clothing?: A Lot 6 Click Score: 16   End of Session Equipment Utilized During Treatment: Gait belt  Activity Tolerance: Patient limited by fatigue Patient left: in chair;with call bell/phone within reach  OT Visit Diagnosis:  Unsteadiness on feet (R26.81);Muscle weakness (generalized) (M62.81);History of falling (Z91.81)                Time: 6788-9338 OT Time Calculation (min): 16 min Charges:  OT General Charges $OT Visit: 1 Visit OT Evaluation $OT Eval Low Complexity: 1 Low    Britt Bottom 05/09/2020, 1:03 PM

## 2020-05-10 LAB — BASIC METABOLIC PANEL
Anion gap: 11 (ref 5–15)
BUN: 16 mg/dL (ref 8–23)
CO2: 24 mmol/L (ref 22–32)
Calcium: 8.5 mg/dL — ABNORMAL LOW (ref 8.9–10.3)
Chloride: 99 mmol/L (ref 98–111)
Creatinine, Ser: 1.12 mg/dL — ABNORMAL HIGH (ref 0.44–1.00)
GFR calc Af Amer: 54 mL/min — ABNORMAL LOW (ref >60)
GFR calc non Af Amer: 47 mL/min — ABNORMAL LOW (ref >60)
Glucose, Bld: 125 mg/dL — ABNORMAL HIGH (ref 70–99)
Potassium: 4 mmol/L (ref 3.5–5.1)
Sodium: 134 mmol/L — ABNORMAL LOW (ref 135–145)

## 2020-05-10 LAB — CBC
HCT: 38.6 % (ref 36.0–46.0)
Hemoglobin: 12.1 g/dL (ref 12.0–15.0)
MCH: 28.4 pg (ref 26.0–34.0)
MCHC: 31.3 g/dL (ref 30.0–36.0)
MCV: 90.6 fL (ref 80.0–100.0)
Platelets: 351 10*3/uL (ref 150–400)
RBC: 4.26 MIL/uL (ref 3.87–5.11)
RDW: 14.5 % (ref 11.5–15.5)
WBC: 9.6 10*3/uL (ref 4.0–10.5)
nRBC: 0 % (ref 0.0–0.2)

## 2020-05-10 LAB — GLUCOSE, CAPILLARY
Glucose-Capillary: 132 mg/dL — ABNORMAL HIGH (ref 70–99)
Glucose-Capillary: 110 mg/dL — ABNORMAL HIGH (ref 70–99)
Glucose-Capillary: 104 mg/dL — ABNORMAL HIGH (ref 70–99)
Glucose-Capillary: 177 mg/dL — ABNORMAL HIGH (ref 70–99)

## 2020-05-10 MED ORDER — SODIUM CHLORIDE 0.9 % IV SOLN
2.0000 g | Freq: Two times a day (BID) | INTRAVENOUS | Status: DC
  Administered 2020-05-10 – 2020-05-17 (×15): 2 g via INTRAVENOUS
  Filled 2020-05-10 (×16): qty 2

## 2020-05-10 MED ORDER — WHITE PETROLATUM EX OINT
TOPICAL_OINTMENT | CUTANEOUS | Status: AC
  Administered 2020-05-10: 0.2
  Filled 2020-05-10: qty 28.35

## 2020-05-10 MED ORDER — LEVALBUTEROL HCL 0.63 MG/3ML IN NEBU
0.6300 mg | INHALATION_SOLUTION | Freq: Three times a day (TID) | RESPIRATORY_TRACT | Status: DC
  Administered 2020-05-10 (×2): 0.63 mg via RESPIRATORY_TRACT
  Filled 2020-05-10 (×2): qty 3

## 2020-05-10 MED ORDER — FUROSEMIDE 10 MG/ML IJ SOLN
20.0000 mg | Freq: Every day | INTRAMUSCULAR | Status: DC
  Administered 2020-05-10 – 2020-05-11 (×2): 20 mg via INTRAVENOUS
  Filled 2020-05-10 (×2): qty 2

## 2020-05-10 MED ORDER — BUDESONIDE 0.25 MG/2ML IN SUSP
0.2500 mg | Freq: Two times a day (BID) | RESPIRATORY_TRACT | Status: DC
  Administered 2020-05-10 – 2020-05-17 (×13): 0.25 mg via RESPIRATORY_TRACT
  Filled 2020-05-10 (×13): qty 2

## 2020-05-10 MED ORDER — DILTIAZEM HCL 60 MG PO TABS
30.0000 mg | ORAL_TABLET | Freq: Four times a day (QID) | ORAL | Status: DC
  Administered 2020-05-10 – 2020-05-12 (×8): 30 mg via ORAL
  Filled 2020-05-10 (×7): qty 1

## 2020-05-10 NOTE — Progress Notes (Signed)
PROGRESS NOTE  Kathleen Griffith YSA:[id number] DOB: Aug 01, 1941 DOA: 05/06/2020 PCP: No primary care provider on file.  HPI/Recap of past 24 hours: HPI from Dr Lorrin Mais is an 79 y.o. female with medical history significant ofPVD: PMR; HTN; HLD; DM with gastroparesis; fibromyalgia; and afib presenting with refractory PNA.She reports that she was doing fine. She flew to [place] to visit a friend. The day after returning home, she was in afib (hasn't been in that in over a year). She fell the night before she came home for the first time in over a year. Things just went from bad to worse. She fell at home 5 days later. She has no energy, takes everything she can do to do anything. She gets up on the side of the chair or bed and has to sit there for an hour or more to get the energy to get up. She can't fix meals. She drinks water. Yesterday, she ate one piece of toast all day. Not really SOB, only with conversation or moving around. +cough, productive of greenish sputum. No fever. She saw Dr. Coletta Memos on 5/5 and he sent her for CXR and ankle xray (from fall); he told her he thought she had atypical PNA and called in azithromycin.    Today, patient still lethargic, productive cough of greenish sputum, reports shortness of breath, denies any chest pain, abdominal pain, nausea/vomiting, fever/chills.  Assessment/Plan: Principal Problem:   CAP (community acquired pneumonia) Active Problems:   Tobacco abuse   Essential hypertension   Hyperlipidemia   Atrial fibrillation with RVR (HCC)   DNR (do not resuscitate)   CAP Currently afebrile, with no leukocytosis Currently saturating around 90% on room air, while sleeping, noted to drop to the 80s Procalcitonin 0.95--> 0.12, downtrending Influenza, COVID-19 negative BC x2 NGTD Sputum culture pending CTA chest showed extensive right upper lobe airspace opacity Chest x-ray showed initially right upper lobe pneumonia, repeat  currently shows multifocal pneumonia (?? component of fluid overload) Due to now possible worsening pneumonia we will switch antibiotics to cefepime, continue azithromycin Duo nebs, Pulmicort, supplemental O2 as needed If no significant improvement after recent changes, will consult pulmonary Continuous pulse ox, telemetry  Persistent A. Fib Likely precipitated by above Echo showed EF of 50 to 55%, no regional wall motion abnormality noted Cardiology on board, continue metoprolol, start low-dose Cardizem, plan for possible cardioversion once pneumonia is treated Continue Xarelto  ?? Fluid overload/acute on chronic systolic HF BNP elevated 3,235 Echo showed EF of 50 to 55%, no regional wall motion abnormality Cardiology on board, agree with IV diuresis with Lasix Daily BMP  Diabetes mellitus type 2 A1c 6.8 SSI, Accu-Cheks, hypoglycemic protocol  Hypertension Continue Lopressor, Cardizem  Hyperlipidemia Continue statins  Tobacco abuse Advised to quit Nicotine patch  [facility] Patient is currently DNR, palliative consulted for further goals of care discussion         Malnutrition Type:      Malnutrition Characteristics:      Nutrition Interventions:       Estimated body mass index is 27.69 kg/m as calculated from the following:   Height as of this encounter: 5\' 3"  (1.6 m).   Weight as of this encounter: 70.9 kg.     Code Status: DNR  Family Communication: Plan to discuss with family  Disposition Plan: Status is: Inpatient  Remains inpatient appropriate because:IV treatments appropriate due to intensity of illness or inability to take PO   Dispo: The patient is from: Home  Anticipated d/c is to: Home              Anticipated d/c date is: 2 days              Patient currently is not medically stable to d/c.,  Requiring IV diuresis, escalation of treatment for  pneumonia     Consultants:  Cardiology  Procedures:  None  Antimicrobials:  Azithromycin  Cefepime  DVT prophylaxis: Xarelto   Objective: Vitals:   05/10/20 0005 05/10/20 0415 05/10/20 0904 05/10/20 1203  BP: (!) 153/95 136/78 136/74 130/71  Pulse: 73 97 95 (!) 51  Resp: 16 19  16   Temp: 97.9 F (36.6 C) 97.9 F (36.6 C)  (!) 97.3 F (36.3 C)  TempSrc: Oral Oral  Oral  SpO2: 93% 94%  (!) 85%  Weight:  70.9 kg    Height:        Intake/Output Summary (Last 24 hours) at 05/10/2020 1454 Last data filed at 05/10/2020 1150 Gross per 24 hour  Intake 240 ml  Output 2850 ml  Net -2610 ml   Filed Weights   05/06/20 1539 05/10/20 0415  Weight: 71.3 kg 70.9 kg    Exam:  General: NAD   Cardiovascular: S1, S2 present  Respiratory:  Diminished breath sounds bilaterally  Abdomen: Soft, nontender, nondistended, bowel sounds present  Musculoskeletal: No bilateral pedal edema noted  Skin: Normal  Psychiatry: Normal mood   Data Reviewed: CBC: Recent Labs  Lab 05/06/20 0912 05/07/20 0556 05/08/20 1049 05/10/20 0729  WBC 9.4 8.4 9.5 9.6  NEUTROABS 6.7 6.2  --   --   HGB 12.9 11.7* 12.0 12.1  HCT 41.1 36.9 38.9 38.6  MCV 91.5 91.3 92.6 90.6  PLT 373 324 328 203   Basic Metabolic Panel: Recent Labs  Lab 05/06/20 0912 05/07/20 0556 05/08/20 1049 05/10/20 0729  NA 136 138 137 134*  K 3.6 3.3* 4.0 4.0  CL 98 105 105 99  CO2 28 24 23 24   GLUCOSE 146* 93 178* 125*  BUN 40* 23 18 16   CREATININE 1.16* 1.01* 1.05* 1.12*  CALCIUM 8.9 8.3* 8.4* 8.5*  MG  --   --  1.7  --    GFR: Estimated Creatinine Clearance: 38.5 mL/min (A) (by C-G formula based on SCr of 1.12 mg/dL (H)). Liver Function Tests: Recent Labs  Lab 05/06/20 0912  AST 15  ALT 14  ALKPHOS 88  BILITOT 0.8  PROT 6.6  ALBUMIN 2.2*   No results for input(s): LIPASE, AMYLASE in the last 168 hours. No results for input(s): AMMONIA in the last 168 hours. Coagulation Profile: Recent  Labs  Lab 05/06/20 0912  INR 1.6*   Cardiac Enzymes: No results for input(s): CKTOTAL, CKMB, CKMBINDEX, TROPONINI in the last 168 hours. BNP (last 3 results) No results for input(s): PROBNP in the last 8760 hours. HbA1C: No results for input(s): HGBA1C in the last 72 hours. CBG: Recent Labs  Lab 05/09/20 1056 05/09/20 1602 05/09/20 2128 05/10/20 0625 05/10/20 1104  GLUCAP 150* 120* 109* 132* 110*   Lipid Profile: No results for input(s): CHOL, HDL, LDLCALC, TRIG, CHOLHDL, LDLDIRECT in the last 72 hours. Thyroid Function Tests: No results for input(s): TSH, T4TOTAL, FREET4, T3FREE, THYROIDAB in the last 72 hours. Anemia Panel: No results for input(s): VITAMINB12, FOLATE, FERRITIN, TIBC, IRON, RETICCTPCT in the last 72 hours. Urine analysis:    Component Value Date/Time   COLORURINE STRAW (A) 12/27/2019 2333   APPEARANCEUR CLEAR 12/27/2019 2333   LABSPEC 1.006  12/27/2019 2333   PHURINE 8.0 12/27/2019 [place] 12/27/2019 2333   HGBUR NEGATIVE 12/27/2019 [place] 12/27/2019 [place] 12/27/2019 2333   PROTEINUR 30 (A) 12/27/2019 2333   NITRITE NEGATIVE 12/27/2019 2333   LEUKOCYTESUR TRACE (A) 12/27/2019 2333   Sepsis Labs: @LABRCNTIP (procalcitonin:4,lacticidven:4)  ) Recent Results (from the past 240 hour(s))  Culture, blood (x 2)     Status: None (Preliminary result)   Collection Time: 05/06/20  9:05 AM   Specimen: BLOOD  Result Value Ref Range Status   Specimen Description BLOOD RIGHT ANTECUBITAL  Final   Special Requests   Final    BOTTLES DRAWN AEROBIC AND ANAEROBIC Blood Culture adequate volume   Culture   Final    NO GROWTH 4 DAYS Performed at [facility], [place] [address] [place], [place] [address]    Report Status PENDING  Incomplete  Culture, blood (x 2)     Status: None (Preliminary result)   Collection Time: 05/06/20  9:10 AM   Specimen: BLOOD RIGHT WRIST  Result Value Ref Range Status    Specimen Description BLOOD RIGHT WRIST  Final   Special Requests   Final    BOTTLES DRAWN AEROBIC AND ANAEROBIC Blood Culture adequate volume   Culture   Final    NO GROWTH 4 DAYS Performed at [facility], [place] [address] [place], [place] [address]    Report Status PENDING  Incomplete  Respiratory Panel by RT PCR (Flu A&B, Covid) - Nasopharyngeal Swab     Status: None   Collection Time: 05/06/20  9:28 AM   Specimen: Nasopharyngeal Swab  Result Value Ref Range Status   SARS Coronavirus 2 by RT PCR NEGATIVE NEGATIVE Final    Comment: (NOTE) SARS-CoV-2 target nucleic acids are NOT DETECTED. The SARS-CoV-2 RNA is generally detectable in upper respiratoy specimens during the acute phase of infection. The lowest concentration of SARS-CoV-2 viral copies this assay can detect is 131 copies/mL. A negative result does not preclude SARS-Cov-2 infection and should not be used as the sole basis for treatment or other patient management decisions. A negative result may occur with  improper specimen collection/handling, submission of specimen other than nasopharyngeal swab, presence of viral mutation(s) within the areas targeted by this assay, and inadequate number of viral copies (<131 copies/mL). A negative result must be combined with clinical observations, patient history, and epidemiological information. The expected result is Negative. Fact Sheet for Patients:  PinkCheek.be Fact Sheet for Healthcare Providers:  GravelBags.it This test is not yet ap proved or cleared by the [place] FDA and  has been authorized for detection and/or diagnosis of SARS-CoV-2 by FDA under an Emergency Use Authorization (EUA). This EUA will remain  in effect (meaning this test can be used) for the duration of the COVID-19 declaration under Section 564(b)(1) of the Act, 21 U.S.C. section 360bbb-3(b)(1), unless the authorization is terminated  or revoked sooner.    Influenza A by PCR NEGATIVE NEGATIVE Final   Influenza B by PCR NEGATIVE NEGATIVE Final    Comment: (NOTE) The Xpert Xpress SARS-CoV-2/FLU/RSV assay is intended as an aid in  the diagnosis of influenza from Nasopharyngeal swab specimens and  should not be used as a sole basis for treatment. Nasal washings and  aspirates are unacceptable for Xpert Xpress SARS-CoV-2/FLU/RSV  testing. Fact Sheet for Patients: PinkCheek.be Fact Sheet for Healthcare Providers: GravelBags.it This test is not yet approved or cleared by the [place]  States FDA and  has been authorized for detection and/or diagnosis of SARS-CoV-2 by  FDA under an Emergency Use Authorization (EUA). This EUA will remain  in effect (meaning this test can be used) for the duration of the  Covid-19 declaration under Section 564(b)(1) of the Act, 21  U.S.C. section 360bbb-3(b)(1), unless the authorization is  terminated or revoked. Performed at [facility], [place] [address] [place], [place] [address]   Expectorated sputum assessment w rflx to resp cult     Status: None   Collection Time: 05/09/20 12:48 PM   Specimen: Expectorated Sputum  Result Value Ref Range Status   Specimen Description Expect. Sput  Final   Special Requests NONE  Final   Sputum evaluation   Final    THIS SPECIMEN IS ACCEPTABLE FOR SPUTUM CULTURE Performed at [facility], [address], [place], [place] [address]    Report Status 05/09/2020 FINAL  Final  Culture, respiratory     Status: None (Preliminary result)   Collection Time: 05/09/20 12:48 PM  Result Value Ref Range Status   Specimen Description Expect. Sput  Final   Special Requests NONE Reflexed from [id number]  Final   Gram Stain   Final    RARE WBC PRESENT, PREDOMINANTLY PMN FEW SQUAMOUS EPITHELIAL CELLS PRESENT MODERATE GRAM NEGATIVE RODS FEW GRAM POSITIVE COCCI FEW GRAM POSITIVE RODS    Culture    Final    CULTURE REINCUBATED FOR BETTER GROWTH Performed at [facility], [address], [place], [place] [address]    Report Status PENDING  Incomplete      Studies: No results found.  Scheduled Meds: . diltiazem  30 mg Oral Q6H  . docusate sodium  100 mg Oral BID  . furosemide  20 mg Intravenous Daily  . gabapentin  100 mg Oral QHS  . insulin aspart  0-15 Units Subcutaneous TID WC  . insulin aspart  0-5 Units Subcutaneous QHS  . metoprolol tartrate  50 mg Oral BID  . nicotine  14 mg Transdermal Daily  . pantoprazole  40 mg Oral BID  . pravastatin  20 mg Oral QHS  . rivaroxaban  20 mg Oral QHS  . sertraline  50 mg Oral Daily  . sucralfate  1 g Oral TID WC & HS    Continuous Infusions: . azithromycin 500 mg (05/10/20 0954)     LOS: 4 days     Alma Friendly, MD [facility]  If 7PM-7AM, please contact night-coverage [email or web address] 05/10/2020, 2:54 PM

## 2020-05-10 NOTE — Progress Notes (Signed)
Occupational Therapy Treatment Patient Details Name: Kathleen Griffith MRN: [id number] DOB: 1941-01-31 Today's Date: 05/10/2020    History of present illness 79 y.o. female with a hx PAF, COPD, PVD, PMR, hypertension, hyperlipidemia, DM, fibromyalgia admitted to [facility] on 5/8 with PNA, recent falls, afib.   OT comments  Pt ambulated to bathroom and sink with min to min guard assist and demonstrated ability to cross foot over opposite knee to don socks. Pt needing encouragement for OOB. Educated verbally in energy conservation. Pt requesting tub bench for home.  Follow Up Recommendations  Home health OT    Equipment Recommendations  Tub/shower bench    Recommendations for Other Services      Precautions / Restrictions Precautions Precautions: Fall Restrictions Weight Bearing Restrictions: No       Mobility Bed Mobility Overal bed mobility: Needs Assistance Bed Mobility: Supine to Sit;Sit to Supine     Supine to sit: Supervision Sit to supine: Supervision   General bed mobility comments: HOB up  Transfers Overall transfer level: Needs assistance Equipment used: Rolling walker (2 wheeled) Transfers: Sit to/from Stand Sit to Stand: Min assist         General transfer comment: light min to assist to rise    Balance Overall balance assessment: Needs assistance;History of Falls Sitting-balance support: No upper extremity supported;Feet supported Sitting balance-Leahy Scale: Good Sitting balance - Comments: no LOB with donning socks   Standing balance support: Bilateral upper extremity supported Standing balance-Leahy Scale: Poor                             ADL either performed or assessed with clinical judgement   ADL Overall ADL's : Needs assistance/impaired     Grooming: Wash/dry hands;Standing;Supervision/safety           Upper Body Dressing : Set up;Sitting   Lower Body Dressing: Set up;Sitting/lateral leans Lower Body Dressing Details  (indicate cue type and reason): can cross foot over opposite knee to don socks Toilet Transfer: Ambulation;RW;Min guard   Toileting- Clothing Manipulation and Hygiene: Min guard;Sit to/from stand       Functional mobility during ADLs: Min guard;Rolling walker General ADL Comments: Educated in energy conservation verbally.     Vision       Perception     Praxis      Cognition Arousal/Alertness: Awake/alert Behavior During Therapy: WFL for tasks assessed/performed Overall Cognitive Status: Within Functional Limits for tasks assessed                                          Exercises Other Exercises Other Exercises: encouraged pt to use flutter valve   Shoulder Instructions       General Comments      Pertinent Vitals/ Pain       Pain Assessment: No/denies pain  Home Living                                          Prior Functioning/Environment              Frequency  Min 2X/week        Progress Toward Goals  OT Goals(current goals can now be found in the care plan section)  Progress towards OT goals: Progressing toward  goals  Acute Rehab OT Goals Patient Stated Goal: go home once I am better OT Goal Formulation: With patient Time For Goal Achievement: 05/23/20 Potential to Achieve Goals: Good  Plan Discharge plan remains appropriate    Co-evaluation                 AM-PAC OT "6 Clicks" Daily Activity     Outcome Measure   Help from another person eating meals?: None Help from another person taking care of personal grooming?: A Little Help from another person toileting, which includes using toliet, bedpan, or urinal?: A Lot Help from another person bathing (including washing, rinsing, drying)?: A Lot Help from another person to put on and taking off regular upper body clothing?: A Little Help from another person to put on and taking off regular lower body clothing?: A Lot 6 Click Score: 16    End of  Session Equipment Utilized During Treatment: Gait belt;Rolling walker  OT Visit Diagnosis: Unsteadiness on feet (R26.81);Muscle weakness (generalized) (M62.81);History of falling (Z91.81)   Activity Tolerance Patient limited by fatigue   Patient Left in bed;with call bell/phone within reach;with bed alarm set   Nurse Communication          Time: [phone] OT Time Calculation (min): 19 min  Charges: OT General Charges $OT Visit: 1 Visit OT Treatments $Self Care/Home Management : 8-22 mins  Nestor Lewandowsky, OTR/L Acute Rehabilitation Services Pager: [phone] Office: [phone]   Malka So 05/10/2020, 11:25 AM

## 2020-05-10 NOTE — Progress Notes (Signed)
Progress Note  Patient Name: Kathleen Griffith Date of Encounter: 05/10/2020  Primary Cardiologist: Kirk Ruths, MD   Subjective   Still with general malaise and mild dyspnea; cough productive of greenish sputum  Inpatient Medications    Scheduled Meds: . docusate sodium  100 mg Oral BID  . gabapentin  100 mg Oral QHS  . insulin aspart  0-15 Units Subcutaneous TID WC  . insulin aspart  0-5 Units Subcutaneous QHS  . metoprolol tartrate  50 mg Oral BID  . nicotine  14 mg Transdermal Daily  . pantoprazole  40 mg Oral BID  . pravastatin  20 mg Oral QHS  . rivaroxaban  20 mg Oral QHS  . sertraline  50 mg Oral Daily  . sucralfate  1 g Oral TID WC & HS   Continuous Infusions: . azithromycin 500 mg (05/09/20 1109)  . cefTRIAXone (ROCEPHIN)  IV 2 g (05/10/20 0905)   PRN Meds: acetaminophen **OR** acetaminophen, albuterol, bisacodyl, HYDROcodone-acetaminophen, morphine injection, ondansetron **OR** ondansetron (ZOFRAN) IV, polyethylene glycol, zolpidem   Vital Signs    Vitals:   05/09/20 2020 05/10/20 0005 05/10/20 0415 05/10/20 0904  BP: (!) 158/87 (!) 153/95 136/78 136/74  Pulse: (!) 113 73 97 95  Resp: 17 16 19    Temp: 98.2 F (36.8 C) 97.9 F (36.6 C) 97.9 F (36.6 C)   TempSrc: Oral Oral Oral   SpO2: 90% 93% 94%   Weight:   70.9 kg   Height:        Intake/Output Summary (Last 24 hours) at 05/10/2020 0943 Last data filed at 05/10/2020 0755 Gross per 24 hour  Intake 240 ml  Output 3050 ml  Net -2810 ml   Last 3 Weights 05/10/2020 05/06/2020 12/30/2019  Weight (lbs) 156 lb 4.9 oz 157 lb 3 oz 156 lb 4 oz  Weight (kg) 70.9 kg 71.3 kg 70.875 kg      Telemetry    Atrial fibrillation; rate mildly elevated- Personally Reviewed   Physical Exam   GEN: No acute distress.  Elderlly Neck: No JVD, supple Cardiac: irregular  Respiratory: Diminished BS throughout; no wheeze GI: Soft, NT/ND MS: No edema Neuro:  Grossly intact Psych: Normal affect   Labs       Chemistry Recent Labs  Lab 05/06/20 0912 05/06/20 0912 05/07/20 0556 05/08/20 1049 05/10/20 0729  NA 136   < > 138 137 134*  K 3.6   < > 3.3* 4.0 4.0  CL 98   < > 105 105 99  CO2 28   < > 24 23 24   GLUCOSE 146*   < > 93 178* 125*  BUN 40*   < > 23 18 16   CREATININE 1.16*   < > 1.01* 1.05* 1.12*  CALCIUM 8.9   < > 8.3* 8.4* 8.5*  PROT 6.6  --   --   --   --   ALBUMIN 2.2*  --   --   --   --   AST 15  --   --   --   --   ALT 14  --   --   --   --   ALKPHOS 88  --   --   --   --   BILITOT 0.8  --   --   --   --   GFRNONAA 45*   < > 53* 50* 47*  GFRAA 52*   < > >60 58* 54*  ANIONGAP 10   < > 9 9 11    < > =  values in this interval not displayed.     Hematology Recent Labs  Lab 05/07/20 0556 05/08/20 1049 05/10/20 0729  WBC 8.4 9.5 9.6  RBC 4.04 4.20 4.26  HGB 11.7* 12.0 12.1  HCT 36.9 38.9 38.6  MCV 91.3 92.6 90.6  MCH 29.0 28.6 28.4  MCHC 31.7 30.8 31.3  RDW 14.4 14.5 14.5  PLT 324 328 351    BNP Recent Labs  Lab 05/06/20 1000 05/09/20 1031  BNP 742.9* 1,775.3*     Patient Profile     79 y.o. female with a hx PAF, COPD, PVD, PMR, hypertension, hyperlipidemia, DM, fibromyalgia of  who is being seen today for the evaluation of persistent atrial fibrillation. Admitted with pneumonia; echo shows normal LV function.  Assessment & Plan    1. Persistent atrial fibrillation-patient remains in atrial fibrillation today.  This is likely precipitated by her pneumonia.  Echocardiogram shows normal LV function.  We will continue metoprolol and I will add low-dose Cardizem at 30 mg by mouth every 6 hours for improved control.  Continue Xarelto.  Once pneumonia is treated (this is likely driving the atrial fibrillation) we will plan to arrange cardioversion as she has missed no doses of Xarelto.   2. Pneumonia-repeat chest x-ray shows worsening pneumonia.  There may also be a component of edema as BNP is elevated.  We will treat with Lasix 20 mg IV daily.  Continue  antibiotics.  Would ask pulmonary to evaluate and may need to broaden antibiotics. 3. Hypertension-controlled; continue present blood pressure medications and follow. 4. Tobacco abuse-patient states she discontinued 3 weeks ago.  For questions or updates, please contact [place] Please consult [email or web address] for contact info under        Signed, Kirk Ruths, MD  05/10/2020, 9:43 AM

## 2020-05-10 NOTE — Progress Notes (Signed)
Pharmacy Antibiotic Note  Kathleen Griffith is a 79 y.o. female admitted on 05/06/2020 with multifocal pneumonia.  Pharmacy has been consulted for cefepime dosing.  Yesterday's repeat chest xray shows extensive BILATERAL pulmonary infiltrates consistent with multifocal pneumonia, greatest in RIGHT upper lobe.  Infiltrates are progressive since prior study. Today patient is afebrile, WBC WNL, and est'd CrCl 30-60 ml/min.  Plan: Cefepime 2 g IV q12h Monitor clinical progress, renal function F/U C&S, abx deescalation / LOT   Height: 5\' 3"  (160 cm) Weight: 70.9 kg (156 lb 4.9 oz) IBW/kg (Calculated) : 52.4  Temp (24hrs), Avg:97.8 F (36.6 C), Min:97.3 F (36.3 C), Max:98.2 F (36.8 C)  Recent Labs  Lab 05/06/20 0912 05/07/20 0556 05/08/20 1049 05/10/20 0729  WBC 9.4 8.4 9.5 9.6  CREATININE 1.16* 1.01* 1.05* 1.12*  LATICACIDVEN 1.1  --   --   --     Estimated Creatinine Clearance: 38.5 mL/min (A) (by C-G formula based on SCr of 1.12 mg/dL (H)).    Allergies  Allergen Reactions  . Anoro Ellipta [Umeclidinium-Vilanterol] Palpitations  . Losartan Shortness Of Breath    wheezing wheezing  . Norvasc [Amlodipine Besylate] Swelling  . Pregabalin Swelling    TO LOWER EXTREMTIES TO LOWER EXTREMTIES  . Bupropion     Other reaction(s): Other Sucidal thoughts  . Metoclopramide Other (See Comments)    Tardive dyskinesia, tremors  . Eliquis [Apixaban] Nausea And Vomiting    Severe stomach pain  . Lisinopril Cough  . Metformin Diarrhea  . Metformin And Related Diarrhea    Antimicrobials this admission: Azithromycin 5/8 >> Ceftriaxone 5/8 >> 5/12 Cefepime 5/12 >>  Dose adjustments this admission: n/a  Microbiology results: 5/8 BCx: NGTD 5/11 Sputum: pending  Thank you for allowing pharmacy to be a part of this patient's care.  Efraim Kaufmann, PharmD, BCPS 05/10/2020 3:14 PM

## 2020-05-11 DIAGNOSIS — I4891 Unspecified atrial fibrillation: Secondary | ICD-10-CM

## 2020-05-11 DIAGNOSIS — Z515 Encounter for palliative care: Secondary | ICD-10-CM

## 2020-05-11 LAB — CULTURE, BLOOD (ROUTINE X 2)
Culture: NO GROWTH
Culture: NO GROWTH
Special Requests: ADEQUATE
Special Requests: ADEQUATE

## 2020-05-11 LAB — BASIC METABOLIC PANEL
Anion gap: 11 (ref 5–15)
BUN: 19 mg/dL (ref 8–23)
CO2: 25 mmol/L (ref 22–32)
Calcium: 8.2 mg/dL — ABNORMAL LOW (ref 8.9–10.3)
Chloride: 99 mmol/L (ref 98–111)
Creatinine, Ser: 1.33 mg/dL — ABNORMAL HIGH (ref 0.44–1.00)
GFR calc Af Amer: 44 mL/min — ABNORMAL LOW (ref >60)
GFR calc non Af Amer: 38 mL/min — ABNORMAL LOW (ref >60)
Glucose, Bld: 131 mg/dL — ABNORMAL HIGH (ref 70–99)
Potassium: 3.8 mmol/L (ref 3.5–5.1)
Sodium: 135 mmol/L (ref 135–145)

## 2020-05-11 LAB — CBC WITH DIFFERENTIAL/PLATELET
Abs Immature Granulocytes: 0.04 10*3/uL (ref 0.00–0.07)
Basophils Absolute: 0.1 10*3/uL (ref 0.0–0.1)
Basophils Relative: 1 %
Eosinophils Absolute: 0.2 10*3/uL (ref 0.0–0.5)
Eosinophils Relative: 2 %
HCT: 35.9 % — ABNORMAL LOW (ref 36.0–46.0)
Hemoglobin: 11.5 g/dL — ABNORMAL LOW (ref 12.0–15.0)
Immature Granulocytes: 0 %
Lymphocytes Relative: 21 %
Lymphs Abs: 2.1 10*3/uL (ref 0.7–4.0)
MCH: 28.9 pg (ref 26.0–34.0)
MCHC: 32 g/dL (ref 30.0–36.0)
MCV: 90.2 fL (ref 80.0–100.0)
Monocytes Absolute: 0.6 10*3/uL (ref 0.1–1.0)
Monocytes Relative: 6 %
Neutro Abs: 7 10*3/uL (ref 1.7–7.7)
Neutrophils Relative %: 70 %
Platelets: 340 10*3/uL (ref 150–400)
RBC: 3.98 MIL/uL (ref 3.87–5.11)
RDW: 14.4 % (ref 11.5–15.5)
WBC: 9.9 10*3/uL (ref 4.0–10.5)
nRBC: 0 % (ref 0.0–0.2)

## 2020-05-11 LAB — CULTURE, RESPIRATORY W GRAM STAIN: Culture: NORMAL

## 2020-05-11 LAB — TSH: TSH: 4.043 u[IU]/mL (ref 0.350–4.500)

## 2020-05-11 LAB — GLUCOSE, CAPILLARY
Glucose-Capillary: 134 mg/dL — ABNORMAL HIGH (ref 70–99)
Glucose-Capillary: 149 mg/dL — ABNORMAL HIGH (ref 70–99)
Glucose-Capillary: 111 mg/dL — ABNORMAL HIGH (ref 70–99)
Glucose-Capillary: 243 mg/dL — ABNORMAL HIGH (ref 70–99)

## 2020-05-11 MED ORDER — METOPROLOL TARTRATE 25 MG PO TABS
25.0000 mg | ORAL_TABLET | Freq: Two times a day (BID) | ORAL | Status: DC
  Administered 2020-05-11: 25 mg via ORAL
  Filled 2020-05-11: qty 1

## 2020-05-11 MED ORDER — LEVALBUTEROL HCL 0.63 MG/3ML IN NEBU
0.6300 mg | INHALATION_SOLUTION | Freq: Four times a day (QID) | RESPIRATORY_TRACT | Status: DC
  Administered 2020-05-11: 0.63 mg via RESPIRATORY_TRACT
  Filled 2020-05-11: qty 3

## 2020-05-11 MED ORDER — RIVAROXABAN 15 MG PO TABS
15.0000 mg | ORAL_TABLET | Freq: Every day | ORAL | Status: DC
  Administered 2020-05-11 – 2020-05-16 (×6): 15 mg via ORAL
  Filled 2020-05-11 (×7): qty 1

## 2020-05-11 MED ORDER — LEVALBUTEROL HCL 0.63 MG/3ML IN NEBU
0.6300 mg | INHALATION_SOLUTION | Freq: Two times a day (BID) | RESPIRATORY_TRACT | Status: DC
  Administered 2020-05-11 – 2020-05-17 (×11): 0.63 mg via RESPIRATORY_TRACT
  Filled 2020-05-11 (×12): qty 3

## 2020-05-11 NOTE — Progress Notes (Signed)
PROGRESS NOTE  Kathleen Griffith UUV:[id number] DOB: 08-Apr-1941 DOA: 05/06/2020 PCP: No primary care provider on file.  HPI/Recap of past 24 hours: HPI from Dr Lorrin Mais is an 79 y.o. female with medical history significant ofPVD: PMR; HTN; HLD; DM with gastroparesis; fibromyalgia; and afib presenting with refractory PNA.She reports that she was doing fine. She flew to [place] to visit a friend. The day after returning home, she was in afib (hasn't been in that in over a year). She fell the night before she came home for the first time in over a year. Things just went from bad to worse. She fell at home 5 days later. She has no energy, takes everything she can do to do anything. She gets up on the side of the chair or bed and has to sit there for an hour or more to get the energy to get up. She can't fix meals. She drinks water. Yesterday, she ate one piece of toast all day. Not really SOB, only with conversation or moving around. +cough, productive of greenish sputum. No fever. She saw Dr. Coletta Memos on 5/5 and he sent her for CXR and ankle xray (from fall); he told her he thought she had atypical PNA and called in azithromycin.    Today, patient reports feeling more tired, reports breathing has slightly improved, denies any chest pain, abdominal pain, nausea/vomiting, fever/chills.  Discussed with patient about the need to participate with PT.    Assessment/Plan: Principal Problem:   CAP (community acquired pneumonia) Active Problems:   Tobacco abuse   Essential hypertension   Hyperlipidemia   Atrial fibrillation with RVR (HCC)   DNR (do not resuscitate)   CAP Currently afebrile, with no leukocytosis Currently saturating around 90% on room air Procalcitonin 0.95--> 0.12, downtrending Influenza, COVID-19 negative BC x2 NGTD Sputum culture consistent with normal respiratory flora CTA chest showed extensive right upper lobe airspace opacity Chest x-ray showed  initially right upper lobe pneumonia, repeat currently shows multifocal pneumonia (?? component of fluid overload) Continue cefepime, azithromycin Duo nebs, Pulmicort, supplemental O2 as needed If no significant improvement after recent changes, will consult pulmonary Continuous pulse ox, telemetry  Persistent A. Fib Likely precipitated by above Echo showed EF of 50 to 55%, no regional wall motion abnormality noted Cardiology on board, continue metoprolol, l ow-dose Cardizem, plan for possible cardioversion once pneumonia is treated Continue Xarelto  ?? Fluid overload/acute on chronic systolic HF BNP elevated 4,742 Echo showed EF of 50 to 55%, no regional wall motion abnormality Cardiology on board, agree with IV diuresis with Lasix Daily BMP  Diabetes mellitus type 2 A1c 6.8 SSI, Accu-Cheks, hypoglycemic protocol  Hypertension Continue Lopressor, Cardizem  Hyperlipidemia Continue statins  Tobacco abuse Advised to quit Nicotine patch  [place] Patient is currently DNR, palliative consulted for further goals of care discussion         Malnutrition Type:      Malnutrition Characteristics:      Nutrition Interventions:       Estimated body mass index is 27.57 kg/m as calculated from the following:   Height as of this encounter: 5\' 3"  (1.6 m).   Weight as of this encounter: 70.6 kg.     Code Status: DNR  Family Communication: Plan to discuss with family  Disposition Plan: Status is: Inpatient  Remains inpatient appropriate because:IV treatments appropriate due to intensity of illness or inability to take PO   Dispo: The patient is from: Home  Anticipated d/c is to: Home              Anticipated d/c date is: 2 days              Patient currently is not medically stable to d/c.,  Requiring IV diuresis, escalation of treatment for  pneumonia     Consultants:  Cardiology  Procedures:  None  Antimicrobials:  Azithromycin  Cefepime  DVT prophylaxis: Xarelto   Objective: Vitals:   05/11/20 0454 05/11/20 0724 05/11/20 0859 05/11/20 1125  BP: 132/60  120/66 112/70  Pulse: 83 (!) 101  78  Resp: 17 17 16 18   Temp: 97.7 F (36.5 C)  98.2 F (36.8 C) 97.8 F (36.6 C)  TempSrc: Oral  Oral Oral  SpO2: 96% 97% 99% 95%  Weight: 70.6 kg     Height:        Intake/Output Summary (Last 24 hours) at 05/11/2020 1135 Last data filed at 05/11/2020 1126 Gross per 24 hour  Intake 920 ml  Output 2000 ml  Net -1080 ml   Filed Weights   05/06/20 1539 05/10/20 0415 05/11/20 0454  Weight: 71.3 kg 70.9 kg 70.6 kg    Exam:  General: NAD   Cardiovascular: S1, S2 present  Respiratory:  Diminished breath sounds bilaterally  Abdomen: Soft, nontender, nondistended, bowel sounds present  Musculoskeletal: No bilateral pedal edema noted  Skin: Normal  Psychiatry: Normal mood   Data Reviewed: CBC: Recent Labs  Lab 05/06/20 0912 05/07/20 0556 05/08/20 1049 05/10/20 0729 05/11/20 0330  WBC 9.4 8.4 9.5 9.6 9.9  NEUTROABS 6.7 6.2  --   --  7.0  HGB 12.9 11.7* 12.0 12.1 11.5*  HCT 41.1 36.9 38.9 38.6 35.9*  MCV 91.5 91.3 92.6 90.6 90.2  PLT 373 324 328 351 449   Basic Metabolic Panel: Recent Labs  Lab 05/06/20 0912 05/07/20 0556 05/08/20 1049 05/10/20 0729 05/11/20 0330  NA 136 138 137 134* 135  K 3.6 3.3* 4.0 4.0 3.8  CL 98 105 105 99 99  CO2 28 24 23 24 25   GLUCOSE 146* 93 178* 125* 131*  BUN 40* 23 18 16 19   CREATININE 1.16* 1.01* 1.05* 1.12* 1.33*  CALCIUM 8.9 8.3* 8.4* 8.5* 8.2*  MG  --   --  1.7  --   --    GFR: Estimated Creatinine Clearance: 32.3 mL/min (A) (by C-G formula based on SCr of 1.33 mg/dL (H)). Liver Function Tests: Recent Labs  Lab 05/06/20 0912  AST 15  ALT 14  ALKPHOS 88  BILITOT 0.8  PROT 6.6  ALBUMIN 2.2*   No results for input(s): LIPASE, AMYLASE in  the last 168 hours. No results for input(s): AMMONIA in the last 168 hours. Coagulation Profile: Recent Labs  Lab 05/06/20 0912  INR 1.6*   Cardiac Enzymes: No results for input(s): CKTOTAL, CKMB, CKMBINDEX, TROPONINI in the last 168 hours. BNP (last 3 results) No results for input(s): PROBNP in the last 8760 hours. HbA1C: No results for input(s): HGBA1C in the last 72 hours. CBG: Recent Labs  Lab 05/10/20 1104 05/10/20 1609 05/10/20 2139 05/11/20 0618 05/11/20 1122  GLUCAP 110* 104* 177* 134* 149*   Lipid Profile: No results for input(s): CHOL, HDL, LDLCALC, TRIG, CHOLHDL, LDLDIRECT in the last 72 hours. Thyroid Function Tests: Recent Labs    05/11/20 0330  TSH 4.043   Anemia Panel: No results for input(s): VITAMINB12, FOLATE, FERRITIN, TIBC, IRON, RETICCTPCT in the last 72 hours. Urine analysis:  Component Value Date/Time   COLORURINE STRAW (A) 12/27/2019 2333   APPEARANCEUR CLEAR 12/27/2019 2333   LABSPEC 1.006 12/27/2019 2333   PHURINE 8.0 12/27/2019 2333   GLUCOSEU NEGATIVE 12/27/2019 2333   HGBUR NEGATIVE 12/27/2019 [place] NEGATIVE 12/27/2019 2333   [place] 12/27/2019 2333   PROTEINUR 30 (A) 12/27/2019 2333   NITRITE NEGATIVE 12/27/2019 2333   LEUKOCYTESUR TRACE (A) 12/27/2019 2333   Sepsis Labs: @LABRCNTIP (procalcitonin:4,lacticidven:4)  ) Recent Results (from the past 240 hour(s))  Culture, blood (x 2)     Status: None   Collection Time: 05/06/20  9:05 AM   Specimen: BLOOD  Result Value Ref Range Status   Specimen Description BLOOD RIGHT ANTECUBITAL  Final   Special Requests   Final    BOTTLES DRAWN AEROBIC AND ANAEROBIC Blood Culture adequate volume   Culture   Final    NO GROWTH 5 DAYS Performed at [facility], [place] [address] [place], [place] [address]    Report Status 05/11/2020 FINAL  Final  Culture, blood (x 2)     Status: None   Collection Time: 05/06/20  9:10 AM   Specimen: BLOOD RIGHT WRIST  Result  Value Ref Range Status   Specimen Description BLOOD RIGHT WRIST  Final   Special Requests   Final    BOTTLES DRAWN AEROBIC AND ANAEROBIC Blood Culture adequate volume   Culture   Final    NO GROWTH 5 DAYS Performed at [facility], [place] [address] [place], [place] [address]    Report Status 05/11/2020 FINAL  Final  Respiratory Panel by RT PCR (Flu A&B, Covid) - Nasopharyngeal Swab     Status: None   Collection Time: 05/06/20  9:28 AM   Specimen: Nasopharyngeal Swab  Result Value Ref Range Status   SARS Coronavirus 2 by RT PCR NEGATIVE NEGATIVE Final    Comment: (NOTE) SARS-CoV-2 target nucleic acids are NOT DETECTED. The SARS-CoV-2 RNA is generally detectable in upper respiratoy specimens during the acute phase of infection. The lowest concentration of SARS-CoV-2 viral copies this assay can detect is 131 copies/mL. A negative result does not preclude SARS-Cov-2 infection and should not be used as the sole basis for treatment or other patient management decisions. A negative result may occur with  improper specimen collection/handling, submission of specimen other than nasopharyngeal swab, presence of viral mutation(s) within the areas targeted by this assay, and inadequate number of viral copies (<131 copies/mL). A negative result must be combined with clinical observations, patient history, and epidemiological information. The expected result is Negative. Fact Sheet for Patients:  PinkCheek.be Fact Sheet for Healthcare Providers:  GravelBags.it This test is not yet ap proved or cleared by the [place] FDA and  has been authorized for detection and/or diagnosis of SARS-CoV-2 by FDA under an Emergency Use Authorization (EUA). This EUA will remain  in effect (meaning this test can be used) for the duration of the COVID-19 declaration under Section 564(b)(1) of the Act, 21 U.S.C. section 360bbb-3(b)(1), unless the  authorization is terminated or revoked sooner.    Influenza A by PCR NEGATIVE NEGATIVE Final   Influenza B by PCR NEGATIVE NEGATIVE Final    Comment: (NOTE) The Xpert Xpress SARS-CoV-2/FLU/RSV assay is intended as an aid in  the diagnosis of influenza from Nasopharyngeal swab specimens and  should not be used as a sole basis for treatment. Nasal washings and  aspirates are unacceptable for Xpert Xpress SARS-CoV-2/FLU/RSV  testing. Fact Sheet for Patients:  PinkCheek.be Fact Sheet for Healthcare Providers: GravelBags.it This test is not yet approved or cleared by the [place] FDA and  has been authorized for detection and/or diagnosis of SARS-CoV-2 by  FDA under an Emergency Use Authorization (EUA). This EUA will remain  in effect (meaning this test can be used) for the duration of the  Covid-19 declaration under Section 564(b)(1) of the Act, 21  U.S.C. section 360bbb-3(b)(1), unless the authorization is  terminated or revoked. Performed at [facility], [place] [address] [place], [place] [address]   Expectorated sputum assessment w rflx to resp cult     Status: None   Collection Time: 05/09/20 12:48 PM   Specimen: Expectorated Sputum  Result Value Ref Range Status   Specimen Description Expect. Sput  Final   Special Requests NONE  Final   Sputum evaluation   Final    THIS SPECIMEN IS ACCEPTABLE FOR SPUTUM CULTURE Performed at [facility], [address], [place], [place] [address]    Report Status 05/09/2020 FINAL  Final  Culture, respiratory     Status: None   Collection Time: 05/09/20 12:48 PM  Result Value Ref Range Status   Specimen Description Expect. Sput  Final   Special Requests NONE Reflexed from [phone]  Final   Gram Stain   Final    RARE WBC PRESENT, PREDOMINANTLY PMN FEW SQUAMOUS EPITHELIAL CELLS PRESENT MODERATE GRAM NEGATIVE RODS FEW GRAM POSITIVE COCCI FEW GRAM POSITIVE RODS     Culture   Final    Consistent with normal respiratory flora. Performed at [facility], [place] [address], [place], [place] [address]    Report Status 05/11/2020 FINAL  Final      Studies: No results found.  Scheduled Meds: . budesonide (PULMICORT) nebulizer solution  0.25 mg Nebulization BID  . diltiazem  30 mg Oral Q6H  . docusate sodium  100 mg Oral BID  . furosemide  20 mg Intravenous Daily  . gabapentin  100 mg Oral QHS  . insulin aspart  0-15 Units Subcutaneous TID WC  . insulin aspart  0-5 Units Subcutaneous QHS  . levalbuterol  0.63 mg Nebulization BID  . metoprolol tartrate  25 mg Oral BID  . nicotine  14 mg Transdermal Daily  . pantoprazole  40 mg Oral BID  . pravastatin  20 mg Oral QHS  . rivaroxaban  20 mg Oral QHS  . sertraline  50 mg Oral Daily  . sucralfate  1 g Oral TID WC & HS    Continuous Infusions: . azithromycin 500 mg (05/11/20 0948)  . ceFEPime (MAXIPIME) IV 2 g (05/11/20 0901)     LOS: 5 days     Alma Friendly, MD [facility]  If 7PM-7AM, please contact night-coverage [email or web address] 05/11/2020, 11:35 AM

## 2020-05-11 NOTE — Progress Notes (Signed)
PHARMACY NOTE -  ANTIBIOTIC RENAL DOSE ADJUSTMENT   Request received for Pharmacy to assist with antibiotic renal dose adjustment.  Patient has been initiated on Xarelto for afib. SCr 1.33, estimated CrCl 38 ml/min  - Interaction with diltiazem and Azithro: Plasma concentrations and pharmacologic effects of rivaroxaban may be increased by Moderate CYP3A4 Inhibitors, especially in patients with renal impairment.  Decrease Xarelto 15mg  daily. F/u ability to increase Xarelto back to 20mg /d with improved renal function.  Kathleen Griffith [place], PharmD, BCPS Clinical Staff Pharmacist [email or web address]

## 2020-05-11 NOTE — Progress Notes (Signed)
Physical Therapy Treatment Patient Details Name: Kathleen Griffith MRN: [id number] DOB: April 09, 1941 Today's Date: 05/11/2020    History of Present Illness 79 y.o. female with a hx PAF, COPD, PVD, PMR, hypertension, hyperlipidemia, DM, fibromyalgia admitted to [facility] on 5/8 with PNA, recent falls, afib.    PT Comments    Patient received sleeping in bed, rouses easily. She refused therapy this morning, but agrees to participate this afternoon with some encouragement. Initially agrees to get up to recliner. With encouragement she agrees to walk in the room. She is mod independent with bed mobility and transfers this day. Use of bed rails and increased time. She ambulated 25 feet with RW in room with supervision. She is generally steady, but benefits from supervision, cues for improved safety and posture during mobility. She will continue to benefit from skilled PT while here to improve strength and functional independence for return home at discharge.      Follow Up Recommendations  Home health PT;Supervision - Intermittent;Supervision for mobility/OOB     Equipment Recommendations  None recommended by PT    Recommendations for Other Services       Precautions / Restrictions Precautions Precautions: Fall Restrictions Weight Bearing Restrictions: No    Mobility  Bed Mobility Overal bed mobility: Modified Independent Bed Mobility: Supine to Sit;Sit to Supine     Supine to sit: Modified independent (Device/Increase time) Sit to supine: Modified independent (Device/Increase time)   General bed mobility comments: HOB up, use of bed rails, increased time  Transfers Overall transfer level: Needs assistance Equipment used: Rolling walker (2 wheeled) Transfers: Sit to/from Stand Sit to Stand: Supervision            Ambulation/Gait Ambulation/Gait assistance: Supervision Gait Distance (Feet): 25 Feet Assistive device: Rolling walker (2 wheeled) Gait Pattern/deviations:  Step-through pattern;Trunk flexed Gait velocity: decr   General Gait Details: patient requires mod encouragement to participate, ambulates with RW and supervision. Cues for upright posture, to stay close to ad.   Stairs             Wheelchair Mobility    Modified Rankin (Stroke Patients Only)       Balance Overall balance assessment: Needs assistance Sitting-balance support: Feet supported Sitting balance-Leahy Scale: Good     Standing balance support: Bilateral upper extremity supported;During functional activity Standing balance-Leahy Scale: Fair Standing balance comment: reliant on external assist                            Cognition Arousal/Alertness: Awake/alert Behavior During Therapy: WFL for tasks assessed/performed;Flat affect Overall Cognitive Status: Within Functional Limits for tasks assessed                                        Exercises      General Comments        Pertinent Vitals/Pain Pain Assessment: No/denies pain    Home Living                      Prior Function            PT Goals (current goals can now be found in the care plan section) Acute Rehab PT Goals Patient Stated Goal: go home once I am better PT Goal Formulation: With patient Time For Goal Achievement: 05/23/20 Potential to Achieve Goals: Good Progress towards PT goals: Progressing  toward goals    Frequency    Min 3X/week      PT Plan Current plan remains appropriate    Co-evaluation              AM-PAC PT "6 Clicks" Mobility   Outcome Measure  Help needed turning from your back to your side while in a flat bed without using bedrails?: None Help needed moving from lying on your back to sitting on the side of a flat bed without using bedrails?: A Little Help needed moving to and from a bed to a chair (including a wheelchair)?: A Little Help needed standing up from a chair using your arms (e.g., wheelchair or  bedside chair)?: A Little Help needed to walk in hospital room?: A Little Help needed climbing 3-5 steps with a railing? : A Lot 6 Click Score: 18    End of Session Equipment Utilized During Treatment: Oxygen Activity Tolerance: Patient tolerated treatment well Patient left: in bed;with call bell/phone within reach;with bed alarm set Nurse Communication: Mobility status PT Visit Diagnosis: Difficulty in walking, not elsewhere classified (R26.2);Muscle weakness (generalized) (M62.81);History of falling (Z91.81)     Time: 1355-1415 PT Time Calculation (min) (ACUTE ONLY): 20 min  Charges:  $Gait Training: 8-22 mins                     [employer], PT, GCS 05/11/20,2:23 PM

## 2020-05-11 NOTE — Progress Notes (Addendum)
   RN called to report 3.57 sec pause. Pt in Afib before and after the pause.  No sx.  Asked that RN report pauses > 5 sec or symptomatic pauses.   MD updated>>hold Lopressor.  Rosaria Ferries, PA-C 05/11/2020 1:25 PM

## 2020-05-11 NOTE — Progress Notes (Signed)
Progress Note  Patient Name: Kathleen Griffith Date of Encounter: 05/11/2020  Primary Cardiologist: Kirk Ruths, MD   Subjective   Still dyspneic; no CP  Inpatient Medications    Scheduled Meds: . budesonide (PULMICORT) nebulizer solution  0.25 mg Nebulization BID  . diltiazem  30 mg Oral Q6H  . docusate sodium  100 mg Oral BID  . furosemide  20 mg Intravenous Daily  . gabapentin  100 mg Oral QHS  . insulin aspart  0-15 Units Subcutaneous TID WC  . insulin aspart  0-5 Units Subcutaneous QHS  . levalbuterol  0.63 mg Nebulization BID  . metoprolol tartrate  50 mg Oral BID  . nicotine  14 mg Transdermal Daily  . pantoprazole  40 mg Oral BID  . pravastatin  20 mg Oral QHS  . rivaroxaban  20 mg Oral QHS  . sertraline  50 mg Oral Daily  . sucralfate  1 g Oral TID WC & HS   Continuous Infusions: . azithromycin 500 mg (05/10/20 0954)  . ceFEPime (MAXIPIME) IV 2 g (05/11/20 0901)   PRN Meds: acetaminophen **OR** acetaminophen, albuterol, bisacodyl, HYDROcodone-acetaminophen, morphine injection, ondansetron **OR** ondansetron (ZOFRAN) IV, polyethylene glycol, zolpidem   Vital Signs    Vitals:   05/11/20 0017 05/11/20 0454 05/11/20 0724 05/11/20 0859  BP: 135/63 132/60  120/66  Pulse: 81 83 (!) 101   Resp: 17 17 17 16   Temp: 98.1 F (36.7 C) 97.7 F (36.5 C)  98.2 F (36.8 C)  TempSrc: Oral Oral  Oral  SpO2: 98% 96% 97% 99%  Weight:  70.6 kg    Height:        Intake/Output Summary (Last 24 hours) at 05/11/2020 0922 Last data filed at 05/10/2020 1703 Gross per 24 hour  Intake 800 ml  Output 1400 ml  Net -600 ml   Last 3 Weights 05/11/2020 05/10/2020 05/06/2020  Weight (lbs) 155 lb 10.3 oz 156 lb 4.9 oz 157 lb 3 oz  Weight (kg) 70.6 kg 70.9 kg 71.3 kg      Telemetry    Atrial fibrillation; rate decreased- Personally Reviewed   Physical Exam   GEN: NAD.  Elderlly Neck: No JVD Cardiac: irregular, mildly bradcycardic Respiratory: Diminished BS throughout; no  wheeze or rhonchi GI: Soft, NT/ND MS: No edema Neuro:  No focal findings Psych: Normal affect   Labs      Chemistry Recent Labs  Lab 05/06/20 0912 05/07/20 0556 05/08/20 1049 05/10/20 0729 05/11/20 0330  NA 136   < > 137 134* 135  K 3.6   < > 4.0 4.0 3.8  CL 98   < > 105 99 99  CO2 28   < > 23 24 25   GLUCOSE 146*   < > 178* 125* 131*  BUN 40*   < > 18 16 19   CREATININE 1.16*   < > 1.05* 1.12* 1.33*  CALCIUM 8.9   < > 8.4* 8.5* 8.2*  PROT 6.6  --   --   --   --   ALBUMIN 2.2*  --   --   --   --   AST 15  --   --   --   --   ALT 14  --   --   --   --   ALKPHOS 88  --   --   --   --   BILITOT 0.8  --   --   --   --   GFRNONAA 45*   < >  50* 47* 38*  GFRAA 52*   < > 58* 54* 44*  ANIONGAP 10   < > 9 11 11    < > = values in this interval not displayed.     Hematology Recent Labs  Lab 05/08/20 1049 05/10/20 0729 05/11/20 0330  WBC 9.5 9.6 9.9  RBC 4.20 4.26 3.98  HGB 12.0 12.1 11.5*  HCT 38.9 38.6 35.9*  MCV 92.6 90.6 90.2  MCH 28.6 28.4 28.9  MCHC 30.8 31.3 32.0  RDW 14.5 14.5 14.4  PLT 328 351 340    BNP Recent Labs  Lab 05/06/20 1000 05/09/20 1031  BNP 742.9* 1,775.3*     Patient Profile     79 y.o. female with a hx PAF, COPD, PVD, PMR, hypertension, hyperlipidemia, DM, fibromyalgia of  who is being seen today for the evaluation of persistent atrial fibrillation. Admitted with pneumonia; echo shows normal LV function.  Assessment & Plan    1. Persistent atrial fibrillation-patient remains in atrial fibrillation today.  This is likely precipitated by her pneumonia.  Echocardiogram shows normal LV function.  Mildly bradycardic at times with pause; we will continue metoprolol but decrease to 25 mg BID; continue low-dose Cardizem at 30 mg by mouth every 6 hours (transition to CD when dose stable.  Continue Xarelto.  Once pneumonia is treated (this is likely driving the atrial fibrillation) we will plan to arrange cardioversion as she has missed no doses of  Xarelto.   2. Pneumonia-some improvement; continue antibiotics; also gently diuresing; follow renal function. 3. Hypertension-controlled; continue present blood pressure medications and follow. 4. Tobacco abuse-patient states she discontinued 3 weeks ago.  For questions or updates, please contact [place] Please consult [email or web address] for contact info under        Signed, Kirk Ruths, MD  05/11/2020, 9:22 AM

## 2020-05-11 NOTE — Consult Note (Signed)
Consultation Note Date: 05/11/2020   Patient Name: Kathleen Griffith  DOB: 08/15/1941  MRN: [id number]  Age / Sex: 79 y.o., female  PCP: No primary care provider on file. Referring Physician: Alma Friendly, MD  Reason for Consultation: Establishing goals of care  HPI/Patient Profile: 79 y.o. female  with past medical history of takotsubo cardiomyopathy in 2018, atrial fibrillation, COPD, PMR, PVD, DM with gastroparesis, tobacco use (quit 3 weeks ago) who was admitted on 05/06/2020 with two recent falls and lethargy.  She was found to have CAP and atrial fibrillation.  BNP was 1775. After several days of hospital treatment her pneumonia worsened on CXR.  PMT consultation was ordered.  Today the patient appears clinically improved.   Clinical Assessment and Goals of Care:  I have reviewed medical records including EPIC notes, labs and imaging, received report from Dr. Horris Latino, examined the patient and met at bedside to discuss diagnosis prognosis, GOC, EOL wishes, disposition and options.  I introduced Palliative Medicine as specialized medical care for people living with serious illness. It focuses on providing relief from the symptoms and stress of a serious illness.   We discussed a brief life review of the patient. She married her husband shortly after graduation from college. She worked as a Biomedical scientist in the hospital.  She and her husband had 1 son.  Her husband underwent heart transplant and did well.  He subsequently developed ESRD and was on HD.  In the end he opted to discontinue HD and die at home with Hospice.  Mrs. Vasco and her husband also devoted their lives to caring for their elderly parents and cared for both sets of parents until they died.   Mrs. Laduke now lives in the same apartment building with her son Gerald Stabs.  Gerald Stabs is an Copywriter, advertising.  As far as  functional and nutritional status Mrs. Christian was functioning completely independently prior to admission.  She lives alone.  She does describe two unexpected falls in the last few weeks.  Mrs. Skolnik has a good understanding of her medical condition.  She understands heart failure, atrial fibrillation and pneumonia.   She understands that she will need cardioversion after she improves.  She had it before 1 year ago.  I attempted to elicit values and goals of care important to the patient.  She is currently a DNR.  She does not intend to go to rehab.  She wants to go home and recooperate.  She is not opposed to home health PT / OT  Questions and concerns were addressed.  The family was encouraged to call with questions or concerns.     Primary Decision Maker:  PATIENT  Her HCPOA would be her son Harrell Gave.    SUMMARY OF RECOMMENDATIONS    Will ask chaplain to assist with HCPOA paperwork and notary.   Patient is already a DNR. She is not eligible for Hospice at this point.   She is requesting Home Health PT / OT  Code Status/Advance  Care Planning:  DNR   Symptom Management:   Per primary.  No complaints of symptoms at this time.  Additional Recommendations (Limitations, Scope, Preferences):  Full Scope Treatment    Psycho-social/Spiritual:   Desire for further Chaplaincy support: not discussed.  Prognosis: unable to determine.    Discharge Planning: Home with Home Health      Primary Diagnoses: Present on Admission: . CAP (community acquired pneumonia) . Atrial fibrillation with RVR ([place]) . Essential hypertension . Hyperlipidemia . Tobacco abuse . DNR (do not resuscitate)   I have reviewed the medical record, interviewed the patient and family, and examined the patient. The following aspects are pertinent.  Past Medical History:  Diagnosis Date  . Anemia    microcytic   . Arthritis   . Atrial fibrillation ([place])   . Cataract    REMOVED BILATERAL   . Diabetes mellitus ([place])   . Diverticulosis   . Duodenitis   . Fibromyalgia   . Gastric polyp   . Gastroparesis   . Hyperlipidemia   . Hypertension   . Kidney stones   . Melanoma ([place])   . Microcytic anemia   . PMR (polymyalgia rheumatica) ([facility])   . PVD (peripheral vascular disease) ([place])   . Renal cyst   . Tubular adenoma of colon    Social History   Socioeconomic History  . Marital status: Widowed    Spouse name: Not on file  . Number of children: 1  . Years of education: Not on file  . Highest education level: Not on file  Occupational History  . Not on file  Tobacco Use  . Smoking status: Current Every Day Smoker    Packs/day: 1.00    Years: 60.00    Pack years: 60.00    Types: Cigarettes  . Smokeless tobacco: Never Used  Substance and Sexual Activity  . Alcohol use: No    Alcohol/week: 0.0 standard drinks  . Drug use: No  . Sexual activity: Not on file  Other Topics Concern  . Not on file  Social History Narrative  . Not on file   Social Determinants of Health   Financial Resource Strain:   . Difficulty of Paying Living Expenses:   Food Insecurity:   . Worried About Charity fundraiser in the Last Year:   . Arboriculturist in the Last Year:   Transportation Needs:   . Film/video editor (Medical):   Marland Kitchen Lack of Transportation (Non-Medical):   Physical Activity:   . Days of Exercise per Week:   . Minutes of Exercise per Session:   Stress:   . Feeling of Stress :   Social Connections:   . Frequency of Communication with Friends and Family:   . Frequency of Social Gatherings with Friends and Family:   . Attends Religious Services:   . Active Member of Clubs or Organizations:   . Attends Archivist Meetings:   Marland Kitchen Marital Status:    Family History  Problem Relation Age of Onset  . CAD Father        MI and CABG  . Diabetes Father   . Heart disease Father   . Colon cancer Mother 35   Scheduled Meds: . budesonide (PULMICORT)  nebulizer solution  0.25 mg Nebulization BID  . diltiazem  30 mg Oral Q6H  . docusate sodium  100 mg Oral BID  . gabapentin  100 mg Oral QHS  . insulin aspart  0-15 Units Subcutaneous TID WC  .  insulin aspart  0-5 Units Subcutaneous QHS  . levalbuterol  0.63 mg Nebulization BID  . metoprolol tartrate  25 mg Oral BID  . nicotine  14 mg Transdermal Daily  . pantoprazole  40 mg Oral BID  . pravastatin  20 mg Oral QHS  . rivaroxaban  15 mg Oral QHS  . sertraline  50 mg Oral Daily  . sucralfate  1 g Oral TID WC & HS   Continuous Infusions: . azithromycin 500 mg (05/11/20 0948)  . ceFEPime (MAXIPIME) IV 2 g (05/11/20 0901)   PRN Meds:.acetaminophen **OR** acetaminophen, albuterol, bisacodyl, HYDROcodone-acetaminophen, morphine injection, ondansetron **OR** ondansetron (ZOFRAN) IV, polyethylene glycol, zolpidem Allergies  Allergen Reactions  . Anoro Ellipta [Umeclidinium-Vilanterol] Palpitations  . Losartan Shortness Of Breath    wheezing wheezing  . Norvasc [Amlodipine Besylate] Swelling  . Pregabalin Swelling    TO LOWER EXTREMTIES TO LOWER EXTREMTIES  . Bupropion     Other reaction(s): Other Sucidal thoughts  . Eliquis [Apixaban] Nausea And Vomiting    Severe stomach pain  . Metoclopramide Other (See Comments)    Tardive dyskinesia, tremors  . Lisinopril Cough  . Metformin Diarrhea  . Metformin And Related Diarrhea      Vital Signs: BP 112/70 (BP Location: Right Arm)   Pulse 78   Temp 97.8 F (36.6 C) (Oral)   Resp 18   Ht _0  (1.6 m)   Wt 70.6 kg   SpO2 99%   BMI 27.57 kg/m  Pain Scale: 0-10   Pain Score: 0-No pain   SpO2: SpO2: 99 % O2 Device:SpO2: 99 % O2 Flow Rate: .O2 Flow Rate (L/min): 2 L/min  IO: Intake/output summary:   Intake/Output Summary (Last 24 hours) at 05/11/2020 1554 Last data filed at 05/11/2020 1539 Gross per 24 hour  Intake 1160 ml  Output 1750 ml  Net -590 ml    LBM: Last BM Date: 05/11/20 Baseline Weight: Weight: 71.3  kg Most recent weight: Weight: 70.6 kg     Palliative Assessment/Data: 60%     Time In: 3:00 Time Out: 4:00 Time Total: 60 min. Visit consisted of counseling and education dealing with the complex and emotionally intense issues surrounding the need for palliative care and symptom management in the setting of serious and potentially life-threatening illness. Greater than 50%  of this time was spent counseling and coordinating care related to the above assessment and plan.  Signed by: Florentina Jenny, PA-C Palliative Medicine  Please contact Palliative Medicine Team phone at [phone] for questions and concerns.  For individual provider: See Shea Evans

## 2020-05-12 LAB — CBC WITH DIFFERENTIAL/PLATELET
Abs Immature Granulocytes: 0.03 10*3/uL (ref 0.00–0.07)
Basophils Absolute: 0.1 10*3/uL (ref 0.0–0.1)
Basophils Relative: 1 %
Eosinophils Absolute: 0.2 10*3/uL (ref 0.0–0.5)
Eosinophils Relative: 2 %
HCT: 38.4 % (ref 36.0–46.0)
Hemoglobin: 11.8 g/dL — ABNORMAL LOW (ref 12.0–15.0)
Immature Granulocytes: 0 %
Lymphocytes Relative: 19 %
Lymphs Abs: 1.8 10*3/uL (ref 0.7–4.0)
MCH: 28.4 pg (ref 26.0–34.0)
MCHC: 30.7 g/dL (ref 30.0–36.0)
MCV: 92.3 fL (ref 80.0–100.0)
Monocytes Absolute: 0.5 10*3/uL (ref 0.1–1.0)
Monocytes Relative: 6 %
Neutro Abs: 7.1 10*3/uL (ref 1.7–7.7)
Neutrophils Relative %: 72 %
Platelets: 369 10*3/uL (ref 150–400)
RBC: 4.16 MIL/uL (ref 3.87–5.11)
RDW: 14.5 % (ref 11.5–15.5)
WBC: 9.8 10*3/uL (ref 4.0–10.5)
nRBC: 0 % (ref 0.0–0.2)

## 2020-05-12 LAB — BASIC METABOLIC PANEL
Anion gap: 9 (ref 5–15)
BUN: 13 mg/dL (ref 8–23)
CO2: 31 mmol/L (ref 22–32)
Calcium: 8.4 mg/dL — ABNORMAL LOW (ref 8.9–10.3)
Chloride: 98 mmol/L (ref 98–111)
Creatinine, Ser: 1.21 mg/dL — ABNORMAL HIGH (ref 0.44–1.00)
GFR calc Af Amer: 49 mL/min — ABNORMAL LOW (ref >60)
GFR calc non Af Amer: 43 mL/min — ABNORMAL LOW (ref >60)
Glucose, Bld: 145 mg/dL — ABNORMAL HIGH (ref 70–99)
Potassium: 3.7 mmol/L (ref 3.5–5.1)
Sodium: 138 mmol/L (ref 135–145)

## 2020-05-12 LAB — GLUCOSE, CAPILLARY
Glucose-Capillary: 143 mg/dL — ABNORMAL HIGH (ref 70–99)
Glucose-Capillary: 144 mg/dL — ABNORMAL HIGH (ref 70–99)
Glucose-Capillary: 155 mg/dL — ABNORMAL HIGH (ref 70–99)
Glucose-Capillary: 107 mg/dL — ABNORMAL HIGH (ref 70–99)

## 2020-05-12 LAB — PROCALCITONIN: Procalcitonin: <0.1 ng/mL

## 2020-05-12 MED ORDER — METOPROLOL TARTRATE 12.5 MG HALF TABLET
12.5000 mg | ORAL_TABLET | Freq: Two times a day (BID) | ORAL | Status: DC
  Administered 2020-05-12 (×2): 12.5 mg via ORAL
  Filled 2020-05-12 (×2): qty 1

## 2020-05-12 MED ORDER — DILTIAZEM HCL ER COATED BEADS 120 MG PO CP24
120.0000 mg | ORAL_CAPSULE | Freq: Every day | ORAL | Status: DC
  Administered 2020-05-12 – 2020-05-17 (×6): 120 mg via ORAL
  Filled 2020-05-12 (×7): qty 1

## 2020-05-12 NOTE — Progress Notes (Addendum)
Pt informed nurse at this time that earlier between 1:00 and 2:30p that she could tell her heart skipped some beats, and felt a little short winded and felt bad during that time. Pt did not call nurse/staff to notify of how she was feeling.  Pt currently denies feeling bad, or SOB or any pain.  Nurse did not receive any calls from central monitoring.   Looked over telemetry and noted she had some pauses, that  were around 2 seconds (CMT states 2.3 sec was the most noted), off going night RN reported staff to call if >5.0 seconds, MD/Cards aware of pt having pauses.  Noted sats went down to 88-89% RA at times, but back up to 90's.   Currently sats 93-94% RA, HR  92 afib.   Pt instructed to notify staff when or if she felt this way again, not to wait for someone to round on her to keep [place] informed of how she is feeling.  Pt verbalized understanding and states she will call/notify staff.      Late entry---Dr Crenshaw up on unit this evening approx 4:30, informed him of the above information. No new orders, pleased that HR was staying up in lower 90's as nurse informed him of.

## 2020-05-12 NOTE — Progress Notes (Signed)
Pharmacy Antibiotic Note  Lovena Kluck is a 79 y.o. female admitted on 05/06/2020 with refractory PNA.  Pharmacy has been consulted for Cefepime dosing.  ID: Refractory CAP.  PC WNL 5/11 cxr shows worsening pna - WBC WNL, Afebrile  Cefepime 5/12>> Azith 5/8>> CTX 5/8>>5/12  5/8: BC x 2 neg 5/11: Sputum: Normal flora   Plan: Con't Cefepime 2g IV q12h (for CrCl 30-60)   Height: 5\' 3"  (160 cm) Weight: 70 kg (154 lb 5.2 oz) IBW/kg (Calculated) : 52.4  Temp (24hrs), Avg:97.9 F (36.6 C), Min:97.7 F (36.5 C), Max:98.1 F (36.7 C)  Recent Labs  Lab 05/06/20 0912 05/06/20 0912 05/07/20 0556 05/08/20 1049 05/10/20 0729 05/11/20 0330 05/12/20 0646  WBC 9.4   < > 8.4 9.5 9.6 9.9 9.8  CREATININE 1.16*   < > 1.01* 1.05* 1.12* 1.33* 1.21*  LATICACIDVEN 1.1  --   --   --   --   --   --    < > = values in this interval not displayed.    Estimated Creatinine Clearance: 35.4 mL/min (A) (by C-G formula based on SCr of 1.21 mg/dL (H)).    Allergies  Allergen Reactions  . Anoro Ellipta [Umeclidinium-Vilanterol] Palpitations  . Losartan Shortness Of Breath    wheezing wheezing  . Norvasc [Amlodipine Besylate] Swelling  . Pregabalin Swelling    TO LOWER EXTREMTIES TO LOWER EXTREMTIES  . Bupropion     Other reaction(s): Other Sucidal thoughts  . Eliquis [Apixaban] Nausea And Vomiting    Severe stomach pain  . Metoclopramide Other (See Comments)    Tardive dyskinesia, tremors  . Lisinopril Cough  . Metformin Diarrhea  . Metformin And Related Diarrhea   Jailyn Langhorst S. Alford Highland, PharmD, BCPS Clinical Staff Pharmacist [email or web address]  Wayland Salinas 05/12/2020 9:26 AM

## 2020-05-12 NOTE — Progress Notes (Signed)
Patient was given document by nurse and will page Chaplain when patient has completed form.  Chaplain available as needed.  Jaclynn Major, [place], [facility], Pager [phone]

## 2020-05-12 NOTE — Progress Notes (Addendum)
Progress Note  Patient Name: Kathleen Griffith Date of Encounter: 05/12/2020  Primary Cardiologist: Kirk Ruths, MD   Subjective   Slowly improving; no CP; mild dyspnea  Inpatient Medications    Scheduled Meds: . budesonide (PULMICORT) nebulizer solution  0.25 mg Nebulization BID  . diltiazem  30 mg Oral Q6H  . docusate sodium  100 mg Oral BID  . gabapentin  100 mg Oral QHS  . insulin aspart  0-15 Units Subcutaneous TID WC  . insulin aspart  0-5 Units Subcutaneous QHS  . levalbuterol  0.63 mg Nebulization BID  . metoprolol tartrate  25 mg Oral BID  . nicotine  14 mg Transdermal Daily  . pantoprazole  40 mg Oral BID  . pravastatin  20 mg Oral QHS  . rivaroxaban  15 mg Oral QHS  . sertraline  50 mg Oral Daily  . sucralfate  1 g Oral TID WC & HS   Continuous Infusions: . azithromycin 500 mg (05/11/20 0948)  . ceFEPime (MAXIPIME) IV 2 g (05/11/20 2255)   PRN Meds: acetaminophen **OR** acetaminophen, albuterol, bisacodyl, HYDROcodone-acetaminophen, morphine injection, ondansetron **OR** ondansetron (ZOFRAN) IV, polyethylene glycol, zolpidem   Vital Signs    Vitals:   05/11/20 2336 05/12/20 0617 05/12/20 0716 05/12/20 0826  BP: (!) 110/52 122/75    Pulse: 88 88  87  Resp: 18 18  16   Temp: 98.1 F (36.7 C) 97.7 F (36.5 C)    TempSrc: Oral Oral    SpO2: 98% 97%  96%  Weight:   70 kg   Height:        Intake/Output Summary (Last 24 hours) at 05/12/2020 0925 Last data filed at 05/12/2020 0618 Gross per 24 hour  Intake 360 ml  Output 2150 ml  Net -1790 ml   Last 3 Weights 05/12/2020 05/11/2020 05/10/2020  Weight (lbs) 154 lb 5.2 oz 155 lb 10.3 oz 156 lb 4.9 oz  Weight (kg) 70 kg 70.6 kg 70.9 kg      Telemetry    Atrial fibrillation; rate controlled- Personally Reviewed   Physical Exam   GEN: NAD.  WD Neck: No JVD, supple Cardiac: irregular Respiratory: Diminished BS throughout GI: Soft, NT/ND, no masses MS: No edema Neuro:  Grossly intact Psych:  Normal affect   Labs      Chemistry Recent Labs  Lab 05/06/20 0912 05/07/20 0556 05/10/20 0729 05/11/20 0330 05/12/20 0646  NA 136   < > 134* 135 138  K 3.6   < > 4.0 3.8 3.7  CL 98   < > 99 99 98  CO2 28   < > 24 25 31   GLUCOSE 146*   < > 125* 131* 145*  BUN 40*   < > 16 19 13   CREATININE 1.16*   < > 1.12* 1.33* 1.21*  CALCIUM 8.9   < > 8.5* 8.2* 8.4*  PROT 6.6  --   --   --   --   ALBUMIN 2.2*  --   --   --   --   AST 15  --   --   --   --   ALT 14  --   --   --   --   ALKPHOS 88  --   --   --   --   BILITOT 0.8  --   --   --   --   GFRNONAA 45*   < > 47* 38* 43*  GFRAA 52*   < > 54* 44*  49*  ANIONGAP 10   < > 11 11 9    < > = values in this interval not displayed.     Hematology Recent Labs  Lab 05/10/20 0729 05/11/20 0330 05/12/20 0646  WBC 9.6 9.9 9.8  RBC 4.26 3.98 4.16  HGB 12.1 11.5* 11.8*  HCT 38.6 35.9* 38.4  MCV 90.6 90.2 92.3  MCH 28.4 28.9 28.4  MCHC 31.3 32.0 30.7  RDW 14.5 14.4 14.5  PLT 351 340 369    BNP Recent Labs  Lab 05/06/20 1000 05/09/20 1031  BNP 742.9* 1,775.3*     Patient Profile     79 y.o. female with a hx PAF, COPD, PVD, PMR, hypertension, hyperlipidemia, DM, fibromyalgia of  who is being seen today for the evaluation of persistent atrial fibrillation. Admitted with pneumonia; echo shows normal LV function.  Assessment & Plan    1. Persistent atrial fibrillation-patient remains in atrial fibrillation.  This was felt to be precipitated by her pneumonia.  She has normal LV function on her echocardiogram.  Her heart rate has improved with occasional mild bradycardia.  Change Cardizem CD to 120 mg daily.  Decrease metoprolol to 12.5 mg twice daily.  Continue Xarelto.  Once her pneumonia has been treated she can be discharged and we will see back as an outpatient.  If atrial fibrillation persists we will arrange cardioversion at that time.  I think she is more likely to hold sinus rhythm once her pneumonia is fully treated.     2. Pneumonia-improving; continue antibiotics; lasix DCed; follow clinically. 3. Hypertension-controlled; continue present blood pressure medications and follow. 4. Tobacco abuse-patient states she discontinued 3 weeks ago.  For questions or updates, please contact [place] Please consult [email or web address] for contact info under        Signed, Kirk Ruths, MD  05/12/2020, 9:25 AM

## 2020-05-12 NOTE — Progress Notes (Signed)
PROGRESS NOTE  Kathleen Griffith GUY:[id number] DOB: 1941-01-14 DOA: 05/06/2020 PCP: No primary care provider on file.  HPI/Recap of past 24 hours: HPI from Dr Lorrin Mais is an 79 y.o. female with medical history significant ofPVD: PMR; HTN; HLD; DM with gastroparesis; fibromyalgia; and afib presenting with refractory PNA.She reports that she was doing fine. She flew to [place] to visit a friend. The day after returning home, she was in afib (hasn't been in that in over a year). She fell the night before she came home for the first time in over a year. Things just went from bad to worse. She fell at home 5 days later. She has no energy, takes everything she can do to do anything. She gets up on the side of the chair or bed and has to sit there for an hour or more to get the energy to get up. She can't fix meals. She drinks water. Yesterday, she ate one piece of toast all day. Not really SOB, only with conversation or moving around. +cough, productive of greenish sputum. No fever. She saw Dr. Coletta Memos on 5/5 and he sent her for CXR and ankle xray (from fall); he told her he thought she had atypical PNA and called in azithromycin.    This morning, patient reported feeling better, denies any worsening shortness of breath, chest pain, abdominal pain, nausea/vomiting, fever/chills.  Later this afternoon, noted some pauses on telemetry.    Assessment/Plan: Principal Problem:   CAP (community acquired pneumonia) Active Problems:   Tobacco abuse   Essential hypertension   Hyperlipidemia   Atrial fibrillation with RVR (HCC)   DNR (do not resuscitate)   Palliative care encounter   CAP Currently afebrile, with no leukocytosis Currently saturating around 90% on room air Procalcitonin 0.95--> 0.12, downtrending Influenza, COVID-19 negative BC x2 NGTD Sputum culture consistent with normal respiratory flora CTA chest showed extensive right upper lobe airspace opacity Chest  x-ray showed initially right upper lobe pneumonia, repeat currently shows multifocal pneumonia (?? component of fluid overload) Continue cefepime, azithromycin Duo nebs, Pulmicort, supplemental O2 as needed If no significant improvement after recent changes, will consult pulmonary Continuous pulse ox, telemetry  Persistent A. Fib/Noted pauses on telemetry Likely precipitated by above Echo showed EF of 50 to 55%, no regional wall motion abnormality noted Cardiology on board, reduced metoprolol, Cardizem, plan for possible cardioversion once pneumonia is treated as an outpt Continue Xarelto Telemetry to monitor closely for pauses while on CCB/BB  ?? Fluid overload/acute on chronic systolic HF BNP elevated 5,638 Echo showed EF of 50 to 55%, no regional wall motion abnormality Cardiology on board, s/p IV diuresis, stopped Daily BMP  Diabetes mellitus type 2 A1c 6.8 SSI, Accu-Cheks, hypoglycemic protocol  Hypertension Continue Lopressor, Cardizem  Hyperlipidemia Continue statins  Tobacco abuse Advised to quit Nicotine patch  [place] Patient is currently DNR, palliative consulted for further goals of care discussion         Malnutrition Type:      Malnutrition Characteristics:      Nutrition Interventions:       Estimated body mass index is 27.34 kg/m as calculated from the following:   Height as of this encounter: 5\' 3"  (1.6 m).   Weight as of this encounter: 70 kg.     Code Status: DNR  Family Communication: Plan to discuss with family  Disposition Plan: Status is: Inpatient  Remains inpatient appropriate because:IV treatments appropriate due to intensity of illness or inability to take PO  Dispo: The patient is from: Home              Anticipated d/c is to: Home              Anticipated d/c date is: 1 day              Patient currently is not medically stable to d/c.,  Requiring IV  antibiotics   Consultants:  Cardiology  Procedures:  None  Antimicrobials:  Azithromycin  Cefepime  DVT prophylaxis: Xarelto   Objective: Vitals:   05/12/20 0716 05/12/20 0826 05/12/20 1253 05/12/20 1500  BP:   102/75   Pulse:  87 77   Resp:  16 16   Temp:   98 F (36.7 C)   TempSrc:   Oral   SpO2:  96% 93% 93%  Weight: 70 kg     Height:        Intake/Output Summary (Last 24 hours) at 05/12/2020 1739 Last data filed at 05/12/2020 0618 Gross per 24 hour  Intake --  Output 400 ml  Net -400 ml   Filed Weights   05/10/20 0415 05/11/20 0454 05/12/20 0716  Weight: 70.9 kg 70.6 kg 70 kg    Exam:  General: NAD   Cardiovascular: S1, S2 present  Respiratory:  Diminished breath sounds bilaterally  Abdomen: Soft, nontender, nondistended, bowel sounds present  Musculoskeletal: No bilateral pedal edema noted  Skin: Normal  Psychiatry: Normal mood   Data Reviewed: CBC: Recent Labs  Lab 05/06/20 0912 05/06/20 0912 05/07/20 0556 05/08/20 1049 05/10/20 0729 05/11/20 0330 05/12/20 0646  WBC 9.4   < > 8.4 9.5 9.6 9.9 9.8  NEUTROABS 6.7  --  6.2  --   --  7.0 7.1  HGB 12.9   < > 11.7* 12.0 12.1 11.5* 11.8*  HCT 41.1   < > 36.9 38.9 38.6 35.9* 38.4  MCV 91.5   < > 91.3 92.6 90.6 90.2 92.3  PLT 373   < > 324 328 351 340 369   < > = values in this interval not displayed.   Basic Metabolic Panel: Recent Labs  Lab 05/07/20 0556 05/08/20 1049 05/10/20 0729 05/11/20 0330 05/12/20 0646  NA 138 137 134* 135 138  K 3.3* 4.0 4.0 3.8 3.7  CL 105 105 99 99 98  CO2 24 23 24 25 31   GLUCOSE 93 178* 125* 131* 145*  BUN 23 18 16 19 13   CREATININE 1.01* 1.05* 1.12* 1.33* 1.21*  CALCIUM 8.3* 8.4* 8.5* 8.2* 8.4*  MG  --  1.7  --   --   --    GFR: Estimated Creatinine Clearance: 35.4 mL/min (A) (by C-G formula based on SCr of 1.21 mg/dL (H)). Liver Function Tests: Recent Labs  Lab 05/06/20 0912  AST 15  ALT 14  ALKPHOS 88  BILITOT 0.8  PROT 6.6   ALBUMIN 2.2*   No results for input(s): LIPASE, AMYLASE in the last 168 hours. No results for input(s): AMMONIA in the last 168 hours. Coagulation Profile: Recent Labs  Lab 05/06/20 0912  INR 1.6*   Cardiac Enzymes: No results for input(s): CKTOTAL, CKMB, CKMBINDEX, TROPONINI in the last 168 hours. BNP (last 3 results) No results for input(s): PROBNP in the last 8760 hours. HbA1C: No results for input(s): HGBA1C in the last 72 hours. CBG: Recent Labs  Lab 05/11/20 1612 05/11/20 2158 05/12/20 0619 05/12/20 1103 05/12/20 1618  GLUCAP 111* 243* 143* 144* 155*   Lipid Profile: No results for input(s):  CHOL, HDL, LDLCALC, TRIG, CHOLHDL, LDLDIRECT in the last 72 hours. Thyroid Function Tests: Recent Labs    05/11/20 0330  TSH 4.043   Anemia Panel: No results for input(s): VITAMINB12, FOLATE, FERRITIN, TIBC, IRON, RETICCTPCT in the last 72 hours. Urine analysis:    Component Value Date/Time   COLORURINE STRAW (A) 12/27/2019 2333   APPEARANCEUR CLEAR 12/27/2019 2333   LABSPEC 1.006 12/27/2019 2333   PHURINE 8.0 12/27/2019 2333   GLUCOSEU NEGATIVE 12/27/2019 2333   HGBUR NEGATIVE 12/27/2019 2333   [place] NEGATIVE 12/27/2019 [place] 12/27/2019 2333   PROTEINUR 30 (A) 12/27/2019 2333   NITRITE NEGATIVE 12/27/2019 2333   LEUKOCYTESUR TRACE (A) 12/27/2019 2333   Sepsis Labs: @LABRCNTIP (procalcitonin:4,lacticidven:4)  ) Recent Results (from the past 240 hour(s))  Culture, blood (x 2)     Status: None   Collection Time: 05/06/20  9:05 AM   Specimen: BLOOD  Result Value Ref Range Status   Specimen Description BLOOD RIGHT ANTECUBITAL  Final   Special Requests   Final    BOTTLES DRAWN AEROBIC AND ANAEROBIC Blood Culture adequate volume   Culture   Final    NO GROWTH 5 DAYS Performed at [facility], [place] [address], [place], [place] [address]    Report Status 05/11/2020 FINAL  Final  Culture, blood (x 2)     Status: None   Collection  Time: 05/06/20  9:10 AM   Specimen: BLOOD RIGHT WRIST  Result Value Ref Range Status   Specimen Description BLOOD RIGHT WRIST  Final   Special Requests   Final    BOTTLES DRAWN AEROBIC AND ANAEROBIC Blood Culture adequate volume   Culture   Final    NO GROWTH 5 DAYS Performed at [facility], [place] [address], [place], [place] [address]    Report Status 05/11/2020 FINAL  Final  Respiratory Panel by RT PCR (Flu A&B, Covid) - Nasopharyngeal Swab     Status: None   Collection Time: 05/06/20  9:28 AM   Specimen: Nasopharyngeal Swab  Result Value Ref Range Status   SARS Coronavirus 2 by RT PCR NEGATIVE NEGATIVE Final    Comment: (NOTE) SARS-CoV-2 target nucleic acids are NOT DETECTED. The SARS-CoV-2 RNA is generally detectable in upper respiratoy specimens during the acute phase of infection. The lowest concentration of SARS-CoV-2 viral copies this assay can detect is 131 copies/mL. A negative result does not preclude SARS-Cov-2 infection and should not be used as the sole basis for treatment or other patient management decisions. A negative result may occur with  improper specimen collection/handling, submission of specimen other than nasopharyngeal swab, presence of viral mutation(s) within the areas targeted by this assay, and inadequate number of viral copies (<131 copies/mL). A negative result must be combined with clinical observations, patient history, and epidemiological information. The expected result is Negative. Fact Sheet for Patients:  PinkCheek.be Fact Sheet for Healthcare Providers:  GravelBags.it This test is not yet ap proved or cleared by the [place] FDA and  has been authorized for detection and/or diagnosis of SARS-CoV-2 by FDA under an Emergency Use Authorization (EUA). This EUA will remain  in effect (meaning this test can be used) for the duration of the COVID-19 declaration under Section  564(b)(1) of the Act, 21 U.S.C. section 360bbb-3(b)(1), unless the authorization is terminated or revoked sooner.    Influenza A by PCR NEGATIVE NEGATIVE Final   Influenza B by PCR NEGATIVE NEGATIVE Final    Comment: (NOTE) The  Xpert Xpress SARS-CoV-2/FLU/RSV assay is intended as an aid in  the diagnosis of influenza from Nasopharyngeal swab specimens and  should not be used as a sole basis for treatment. Nasal washings and  aspirates are unacceptable for Xpert Xpress SARS-CoV-2/FLU/RSV  testing. Fact Sheet for Patients: PinkCheek.be Fact Sheet for Healthcare Providers: GravelBags.it This test is not yet approved or cleared by the [place] FDA and  has been authorized for detection and/or diagnosis of SARS-CoV-2 by  FDA under an Emergency Use Authorization (EUA). This EUA will remain  in effect (meaning this test can be used) for the duration of the  Covid-19 declaration under Section 564(b)(1) of the Act, 21  U.S.C. section 360bbb-3(b)(1), unless the authorization is  terminated or revoked. Performed at [facility], [place] [address], [place], [place] [address]   Expectorated sputum assessment w rflx to resp cult     Status: None   Collection Time: 05/09/20 12:48 PM   Specimen: Expectorated Sputum  Result Value Ref Range Status   Specimen Description Expect. Sput  Final   Special Requests NONE  Final   Sputum evaluation   Final    THIS SPECIMEN IS ACCEPTABLE FOR SPUTUM CULTURE Performed at [facility], [address], [place], [place] [address]    Report Status 05/09/2020 FINAL  Final  Culture, respiratory     Status: None   Collection Time: 05/09/20 12:48 PM  Result Value Ref Range Status   Specimen Description Expect. Sput  Final   Special Requests NONE Reflexed from [phone]  Final   Gram Stain   Final    RARE WBC PRESENT, PREDOMINANTLY PMN FEW SQUAMOUS EPITHELIAL CELLS PRESENT MODERATE GRAM  NEGATIVE RODS FEW GRAM POSITIVE COCCI FEW GRAM POSITIVE RODS    Culture   Final    Consistent with normal respiratory flora. Performed at [facility], [place] [address], [place], [place] [address]    Report Status 05/11/2020 FINAL  Final      Studies: No results found.  Scheduled Meds: . budesonide (PULMICORT) nebulizer solution  0.25 mg Nebulization BID  . diltiazem  120 mg Oral Daily  . docusate sodium  100 mg Oral BID  . gabapentin  100 mg Oral QHS  . insulin aspart  0-15 Units Subcutaneous TID WC  . insulin aspart  0-5 Units Subcutaneous QHS  . levalbuterol  0.63 mg Nebulization BID  . metoprolol tartrate  12.5 mg Oral BID  . nicotine  14 mg Transdermal Daily  . pantoprazole  40 mg Oral BID  . pravastatin  20 mg Oral QHS  . rivaroxaban  15 mg Oral QHS  . sertraline  50 mg Oral Daily  . sucralfate  1 g Oral TID WC & HS    Continuous Infusions: . azithromycin 500 mg (05/12/20 1239)  . ceFEPime (MAXIPIME) IV 2 g (05/12/20 1129)     LOS: 6 days     Alma Friendly, MD [facility]  If 7PM-7AM, please contact night-coverage [email or web address] 05/12/2020, 5:39 PM

## 2020-05-12 NOTE — Progress Notes (Signed)
Occupational Therapy Treatment Patient Details Name: Kathleen Griffith MRN: [id number] DOB: 17-Aug-1941 Today's Date: 05/12/2020    History of present illness 79 y.o. female with a hx PAF, COPD, PVD, PMR, hypertension, hyperlipidemia, DM, fibromyalgia admitted to [facility] on 5/8 with PNA, recent falls, afib.   OT comments  Pt visiting with son upon arrival. Declined OOB, but agreeable to grooming and education in energy conservation strategies. Pt's son to install a hand held shower head. Pt continues to agree with recommendation for tub transfer bench.   Follow Up Recommendations  Home health OT    Equipment Recommendations  Tub/shower bench    Recommendations for Other Services      Precautions / Restrictions Precautions Precautions: Fall       Mobility Bed Mobility Overal bed mobility: Modified Independent                Transfers                 General transfer comment: pt declined OOB    Balance     Sitting balance-Leahy Scale: Good                                     ADL either performed or assessed with clinical judgement   ADL Overall ADL's : Needs assistance/impaired     Grooming: Brushing hair;Bed level(shampoo cap)                                 General ADL Comments: Continued to educate pt in energy conservation, pt verbalizing understanding. Recommended son, who was bedside, install a hand held shower head for pt for use with tub transfer bench.     Vision       Perception     Praxis      Cognition Arousal/Alertness: Awake/alert Behavior During Therapy: WFL for tasks assessed/performed;Flat affect Overall Cognitive Status: Within Functional Limits for tasks assessed                                          Exercises     Shoulder Instructions       General Comments      Pertinent Vitals/ Pain       Pain Assessment: No/denies pain  Home Living                                           Prior Functioning/Environment              Frequency  Min 2X/week        Progress Toward Goals  OT Goals(current goals can now be found in the care plan section)  Progress towards OT goals: Not progressing toward goals - comment(pt self limiting)  Acute Rehab OT Goals Patient Stated Goal: go home to her cat OT Goal Formulation: With patient Time For Goal Achievement: 05/23/20 Potential to Achieve Goals: Good  Plan Discharge plan remains appropriate    Co-evaluation                 AM-PAC OT "6 Clicks" Daily Activity     Outcome Measure   Help from another person  eating meals?: None Help from another person taking care of personal grooming?: A Little Help from another person toileting, which includes using toliet, bedpan, or urinal?: A Lot Help from another person bathing (including washing, rinsing, drying)?: A Lot Help from another person to put on and taking off regular upper body clothing?: A Little Help from another person to put on and taking off regular lower body clothing?: A Lot 6 Click Score: 16    End of Session    OT Visit Diagnosis: Unsteadiness on feet (R26.81);Muscle weakness (generalized) (M62.81);History of falling (Z91.81)   Activity Tolerance Other (comment)(pt self limiting)   Patient Left in bed;with call bell/phone within reach;with bed alarm set;with family/visitor present   Nurse Communication          Time: 1410-1433 OT Time Calculation (min): 23 min  Charges: OT General Charges $OT Visit: 1 Visit OT Treatments $Self Care/Home Management : 23-37 mins  Nestor Lewandowsky, OTR/L Acute Rehabilitation Services Pager: [phone] Office: [phone]   Malka So 05/12/2020, 3:51 PM

## 2020-05-13 ENCOUNTER — Inpatient Hospital Stay (HOSPITAL_COMMUNITY): Payer: Medicare HMO

## 2020-05-13 LAB — BASIC METABOLIC PANEL
Anion gap: 6 (ref 5–15)
BUN: 13 mg/dL (ref 8–23)
CO2: 30 mmol/L (ref 22–32)
Calcium: 8.2 mg/dL — ABNORMAL LOW (ref 8.9–10.3)
Chloride: 101 mmol/L (ref 98–111)
Creatinine, Ser: 1.19 mg/dL — ABNORMAL HIGH (ref 0.44–1.00)
GFR calc Af Amer: 50 mL/min — ABNORMAL LOW (ref >60)
GFR calc non Af Amer: 43 mL/min — ABNORMAL LOW (ref >60)
Glucose, Bld: 138 mg/dL — ABNORMAL HIGH (ref 70–99)
Potassium: 4.2 mmol/L (ref 3.5–5.1)
Sodium: 137 mmol/L (ref 135–145)

## 2020-05-13 LAB — CBC WITH DIFFERENTIAL/PLATELET
Abs Immature Granulocytes: 0.04 10*3/uL (ref 0.00–0.07)
Basophils Absolute: 0.1 10*3/uL (ref 0.0–0.1)
Basophils Relative: 1 %
Eosinophils Absolute: 0.2 10*3/uL (ref 0.0–0.5)
Eosinophils Relative: 1 %
HCT: 34.7 % — ABNORMAL LOW (ref 36.0–46.0)
Hemoglobin: 10.7 g/dL — ABNORMAL LOW (ref 12.0–15.0)
Immature Granulocytes: 0 %
Lymphocytes Relative: 20 %
Lymphs Abs: 2.4 10*3/uL (ref 0.7–4.0)
MCH: 28.3 pg (ref 26.0–34.0)
MCHC: 30.8 g/dL (ref 30.0–36.0)
MCV: 91.8 fL (ref 80.0–100.0)
Monocytes Absolute: 0.5 10*3/uL (ref 0.1–1.0)
Monocytes Relative: 4 %
Neutro Abs: 8.9 10*3/uL — ABNORMAL HIGH (ref 1.7–7.7)
Neutrophils Relative %: 74 %
Platelets: 369 10*3/uL (ref 150–400)
RBC: 3.78 MIL/uL — ABNORMAL LOW (ref 3.87–5.11)
RDW: 14.6 % (ref 11.5–15.5)
WBC: 12.1 10*3/uL — ABNORMAL HIGH (ref 4.0–10.5)
nRBC: 0 % (ref 0.0–0.2)

## 2020-05-13 LAB — GLUCOSE, CAPILLARY
Glucose-Capillary: 127 mg/dL — ABNORMAL HIGH (ref 70–99)
Glucose-Capillary: 211 mg/dL — ABNORMAL HIGH (ref 70–99)
Glucose-Capillary: 81 mg/dL (ref 70–99)
Glucose-Capillary: 163 mg/dL — ABNORMAL HIGH (ref 70–99)

## 2020-05-13 MED ORDER — METOPROLOL TARTRATE 12.5 MG HALF TABLET: 12.5000 mg | ORAL_TABLET | Freq: Two times a day (BID) | ORAL | Status: DC

## 2020-05-13 MED ORDER — METOPROLOL TARTRATE 25 MG PO TABS
25.0000 mg | ORAL_TABLET | Freq: Two times a day (BID) | ORAL | Status: DC
  Administered 2020-05-13: 25 mg via ORAL
  Filled 2020-05-13: qty 1

## 2020-05-13 NOTE — Progress Notes (Addendum)
PROGRESS NOTE  Kathleen Griffith HQP:[id number] DOB: 1941/12/25 DOA: 05/06/2020 PCP: No primary care provider on file.  HPI/Recap of past 24 hours: HPI from Dr Lorrin Mais is an 79 y.o. female with medical history significant ofPVD: PMR; HTN; HLD; DM with gastroparesis; fibromyalgia; and afib presenting with refractory PNA.She reports that she was doing fine. She flew to [place] to visit a friend. The day after returning home, she was in afib (hasn't been in that in over a year). She fell the night before she came home for the first time in over a year. Things just went from bad to worse. She fell at home 5 days later. She has no energy, takes everything she can do to do anything. She gets up on the side of the chair or bed and has to sit there for an hour or more to get the energy to get up. She can't fix meals. She drinks water. Yesterday, she ate one piece of toast all day. Not really SOB, only with conversation or moving around. +cough, productive of greenish sputum. No fever. She saw Dr. Coletta Memos on 5/5 and he sent her for CXR and ankle xray (from fall); he told her he thought she had atypical PNA and called in azithromycin.    Today, patient denies any new complaints, denies any worsening shortness of breath, chest pain, abdominal pain, nausea/vomiting, fever/chills.  Noted frequent pauses on telemetry, greater than 10, longest about 2 seconds.      Assessment/Plan: Principal Problem:   CAP (community acquired pneumonia) Active Problems:   Tobacco abuse   Essential hypertension   Hyperlipidemia   Atrial fibrillation with RVR (HCC)   DNR (do not resuscitate)   Palliative care encounter   CAP Currently afebrile, now with leukocytosis Currently saturating above 90% on room air Procalcitonin 0.95--> 0.12, downtrending Influenza, COVID-19 negative BC x2 NGTD Sputum culture consistent with normal respiratory flora CTA chest showed extensive right upper lobe  airspace opacity Chest x-ray showed initially right upper lobe pneumonia, repeat currently shows multifocal pneumonia (?? component of fluid overload) Continue cefepime, azithromycin Duo nebs, Pulmicort, supplemental O2 as needed Continuous pulse ox, telemetry  Persistent A. Fib/Noted pauses on telemetry Echo showed EF of 50 to 55%, no regional wall motion abnormality noted Cardiology on board, reduced metoprolol, Cardizem, plan for possible cardioversion once pneumonia is treated as an outpt Discussed with Dr. Rayann Heman on 05/13/2020, about greater than 10 pauses noted since this a.m., noted metoprolol increased by cardiology NP Sharee Pimple to 25 twice daily, for unknown reason, Dr. Rayann Heman recommended to continue Lopressor and monitor Continue Xarelto Telemetry to monitor closely for pauses while on CCB/BB  ?? Fluid overload/acute on chronic systolic HF BNP elevated 5,993 Echo showed EF of 50 to 55%, no regional wall motion abnormality Cardiology on board, s/p IV diuresis, stopped due to rising Cr Daily BMP  Diabetes mellitus type 2 A1c 6.8 SSI, Accu-Cheks, hypoglycemic protocol  Hypertension Continue Lopressor, Cardizem  Hyperlipidemia Continue statins  Tobacco abuse Advised to quit Nicotine patch  [place] Patient is currently DNR, palliative consulted for further goals of care discussion         Malnutrition Type:      Malnutrition Characteristics:      Nutrition Interventions:       Estimated body mass index is 27.38 kg/m as calculated from the following:   Height as of this encounter: 5\' 3"  (1.6 m).   Weight as of this encounter: 70.1 kg.     Code Status:  DNR  Family Communication: Discussed extensively with patient, plan to discuss with family if needed  Disposition Plan: Status is: Inpatient  Remains inpatient appropriate because:IV treatments appropriate due to intensity of illness or inability to take PO   Dispo: The patient is from: Home               Anticipated d/c is to: Home              Anticipated d/c date is: 1 day on 05/14/20              Patient currently is not medically stable to d/c.,  Requiring IV antibiotics, with recent jump in WBC   Consultants:  Cardiology  Procedures:  None  Antimicrobials:  Azithromycin  Cefepime  DVT prophylaxis: Xarelto   Objective: Vitals:   05/13/20 0616 05/13/20 0937 05/13/20 0940 05/13/20 1203  BP: (!) 142/73   111/73  Pulse: 68   79  Resp: 18   16  Temp: 98.4 F (36.9 C)   99.3 F (37.4 C)  TempSrc: Oral   Oral  SpO2: 92% 94% 95% 95%  Weight: 70.1 kg     Height:        Intake/Output Summary (Last 24 hours) at 05/13/2020 1551 Last data filed at 05/13/2020 6237 Gross per 24 hour  Intake 240 ml  Output 1200 ml  Net -960 ml   Filed Weights   05/11/20 0454 05/12/20 0716 05/13/20 0616  Weight: 70.6 kg 70 kg 70.1 kg    Exam:  General: NAD   Cardiovascular: S1, S2 present  Respiratory:  Diminished breath sounds bilaterally  Abdomen: Soft, nontender, nondistended, bowel sounds present  Musculoskeletal: No bilateral pedal edema noted  Skin: Normal  Psychiatry: Normal mood   Data Reviewed: CBC: Recent Labs  Lab 05/07/20 0556 05/07/20 0556 05/08/20 1049 05/10/20 0729 05/11/20 0330 05/12/20 0646 05/13/20 0237  WBC 8.4   < > 9.5 9.6 9.9 9.8 12.1*  NEUTROABS 6.2  --   --   --  7.0 7.1 8.9*  HGB 11.7*   < > 12.0 12.1 11.5* 11.8* 10.7*  HCT 36.9   < > 38.9 38.6 35.9* 38.4 34.7*  MCV 91.3   < > 92.6 90.6 90.2 92.3 91.8  PLT 324   < > 328 351 340 369 369   < > = values in this interval not displayed.   Basic Metabolic Panel: Recent Labs  Lab 05/08/20 1049 05/10/20 0729 05/11/20 0330 05/12/20 0646 05/13/20 0237  NA 137 134* 135 138 137  K 4.0 4.0 3.8 3.7 4.2  CL 105 99 99 98 101  CO2 23 24 25 31 30   GLUCOSE 178* 125* 131* 145* 138*  BUN 18 16 19 13 13   CREATININE 1.05* 1.12* 1.33* 1.21* 1.19*  CALCIUM 8.4* 8.5* 8.2* 8.4* 8.2*  MG 1.7  --   --    --   --    GFR: Estimated Creatinine Clearance: 36 mL/min (A) (by C-G formula based on SCr of 1.19 mg/dL (H)). Liver Function Tests: No results for input(s): AST, ALT, ALKPHOS, BILITOT, PROT, ALBUMIN in the last 168 hours. No results for input(s): LIPASE, AMYLASE in the last 168 hours. No results for input(s): AMMONIA in the last 168 hours. Coagulation Profile: No results for input(s): INR, PROTIME in the last 168 hours. Cardiac Enzymes: No results for input(s): CKTOTAL, CKMB, CKMBINDEX, TROPONINI in the last 168 hours. BNP (last 3 results) No results for input(s): PROBNP in the last 8760 hours.  HbA1C: No results for input(s): HGBA1C in the last 72 hours. CBG: Recent Labs  Lab 05/12/20 1103 05/12/20 1618 05/12/20 2144 05/13/20 0617 05/13/20 1104  GLUCAP 144* 155* 107* 127* 211*   Lipid Profile: No results for input(s): CHOL, HDL, LDLCALC, TRIG, CHOLHDL, LDLDIRECT in the last 72 hours. Thyroid Function Tests: Recent Labs    05/11/20 0330  TSH 4.043   Anemia Panel: No results for input(s): VITAMINB12, FOLATE, FERRITIN, TIBC, IRON, RETICCTPCT in the last 72 hours. Urine analysis:    Component Value Date/Time   COLORURINE STRAW (A) 12/27/2019 2333   APPEARANCEUR CLEAR 12/27/2019 2333   LABSPEC 1.006 12/27/2019 2333   PHURINE 8.0 12/27/2019 2333   GLUCOSEU NEGATIVE 12/27/2019 2333   HGBUR NEGATIVE 12/27/2019 2333   [place] NEGATIVE 12/27/2019 [place] 12/27/2019 2333   PROTEINUR 30 (A) 12/27/2019 2333   NITRITE NEGATIVE 12/27/2019 2333   LEUKOCYTESUR TRACE (A) 12/27/2019 2333   Sepsis Labs: @LABRCNTIP (procalcitonin:4,lacticidven:4)  ) Recent Results (from the past 240 hour(s))  Culture, blood (x 2)     Status: None   Collection Time: 05/06/20  9:05 AM   Specimen: BLOOD  Result Value Ref Range Status   Specimen Description BLOOD RIGHT ANTECUBITAL  Final   Special Requests   Final    BOTTLES DRAWN AEROBIC AND ANAEROBIC Blood Culture  adequate volume   Culture   Final    NO GROWTH 5 DAYS Performed at [facility], [place] [address], [place], [place] [address]    Report Status 05/11/2020 FINAL  Final  Culture, blood (x 2)     Status: None   Collection Time: 05/06/20  9:10 AM   Specimen: BLOOD RIGHT WRIST  Result Value Ref Range Status   Specimen Description BLOOD RIGHT WRIST  Final   Special Requests   Final    BOTTLES DRAWN AEROBIC AND ANAEROBIC Blood Culture adequate volume   Culture   Final    NO GROWTH 5 DAYS Performed at [facility], [place] [address] [place], [place] [address]    Report Status 05/11/2020 FINAL  Final  Respiratory Panel by RT PCR (Flu A&B, Covid) - Nasopharyngeal Swab     Status: None   Collection Time: 05/06/20  9:28 AM   Specimen: Nasopharyngeal Swab  Result Value Ref Range Status   SARS Coronavirus 2 by RT PCR NEGATIVE NEGATIVE Final    Comment: (NOTE) SARS-CoV-2 target nucleic acids are NOT DETECTED. The SARS-CoV-2 RNA is generally detectable in upper respiratoy specimens during the acute phase of infection. The lowest concentration of SARS-CoV-2 viral copies this assay can detect is 131 copies/mL. A negative result does not preclude SARS-Cov-2 infection and should not be used as the sole basis for treatment or other patient management decisions. A negative result may occur with  improper specimen collection/handling, submission of specimen other than nasopharyngeal swab, presence of viral mutation(s) within the areas targeted by this assay, and inadequate number of viral copies (<131 copies/mL). A negative result must be combined with clinical observations, patient history, and epidemiological information. The expected result is Negative. Fact Sheet for Patients:  PinkCheek.be Fact Sheet for Healthcare Providers:  GravelBags.it This test is not yet ap proved or cleared by the [place] FDA and  has been  authorized for detection and/or diagnosis of SARS-CoV-2 by FDA under an Emergency Use Authorization (EUA). This EUA will remain  in effect (meaning this test can be used) for the duration of the COVID-19 declaration under Section  564(b)(1) of the Act, 21 U.S.C. section 360bbb-3(b)(1), unless the authorization is terminated or revoked sooner.    Influenza A by PCR NEGATIVE NEGATIVE Final   Influenza B by PCR NEGATIVE NEGATIVE Final    Comment: (NOTE) The Xpert Xpress SARS-CoV-2/FLU/RSV assay is intended as an aid in  the diagnosis of influenza from Nasopharyngeal swab specimens and  should not be used as a sole basis for treatment. Nasal washings and  aspirates are unacceptable for Xpert Xpress SARS-CoV-2/FLU/RSV  testing. Fact Sheet for Patients: PinkCheek.be Fact Sheet for Healthcare Providers: GravelBags.it This test is not yet approved or cleared by the [place] FDA and  has been authorized for detection and/or diagnosis of SARS-CoV-2 by  FDA under an Emergency Use Authorization (EUA). This EUA will remain  in effect (meaning this test can be used) for the duration of the  Covid-19 declaration under Section 564(b)(1) of the Act, 21  U.S.C. section 360bbb-3(b)(1), unless the authorization is  terminated or revoked. Performed at [facility], [place] [address], [place], [place] [address]   Expectorated sputum assessment w rflx to resp cult     Status: None   Collection Time: 05/09/20 12:48 PM   Specimen: Expectorated Sputum  Result Value Ref Range Status   Specimen Description Expect. Sput  Final   Special Requests NONE  Final   Sputum evaluation   Final    THIS SPECIMEN IS ACCEPTABLE FOR SPUTUM CULTURE Performed at [facility], [address] [place], [place] [address]    Report Status 05/09/2020 FINAL  Final  Culture, respiratory     Status: None   Collection Time: 05/09/20 12:48 PM  Result Value Ref  Range Status   Specimen Description Expect. Sput  Final   Special Requests NONE Reflexed from [phone]  Final   Gram Stain   Final    RARE WBC PRESENT, PREDOMINANTLY PMN FEW SQUAMOUS EPITHELIAL CELLS PRESENT MODERATE GRAM NEGATIVE RODS FEW GRAM POSITIVE COCCI FEW GRAM POSITIVE RODS    Culture   Final    Consistent with normal respiratory flora. Performed at [facility], [place] [address], [place], [place] [address]    Report Status 05/11/2020 FINAL  Final      Studies: DG Chest Port 1 View  Result Date: 05/13/2020 CLINICAL DATA:  Follow-up pneumonia EXAM: PORTABLE CHEST 1 VIEW COMPARISON:  05/09/2020 FINDINGS: Cardiomegaly and aortic atherosclerosis as seen previously. Widespread pulmonary infiltrates persist but show some radiographic improvement. More dense consolidation in the right upper lobe seen previously is again noted but is also improving. No worsening or new finding. IMPRESSION: Improving widespread pulmonary infiltrates, most pronounced in the right upper lobe. No worsening or new finding. Electronically Signed   By: Nelson Chimes M.D.   On: 05/13/2020 08:44    Scheduled Meds: . budesonide (PULMICORT) nebulizer solution  0.25 mg Nebulization BID  . diltiazem  120 mg Oral Daily  . docusate sodium  100 mg Oral BID  . gabapentin  100 mg Oral QHS  . insulin aspart  0-15 Units Subcutaneous TID WC  . insulin aspart  0-5 Units Subcutaneous QHS  . levalbuterol  0.63 mg Nebulization BID  . metoprolol tartrate  25 mg Oral BID  . nicotine  14 mg Transdermal Daily  . pantoprazole  40 mg Oral BID  . pravastatin  20 mg Oral QHS  . rivaroxaban  15 mg Oral QHS  . sertraline  50 mg Oral Daily  . sucralfate  1 g  Oral TID WC & HS    Continuous Infusions: . azithromycin 500 mg (05/13/20 1217)  . ceFEPime (MAXIPIME) IV 2 g (05/13/20 1000)     LOS: 7 days     Alma Friendly, MD [facility]  If 7PM-7AM, please contact night-coverage [email or web address] 05/13/2020,  3:51 PM

## 2020-05-13 NOTE — Progress Notes (Signed)
Progress Note  Patient Name: Harlene Petralia Date of Encounter: 05/14/2020  Primary Cardiologist: Kirk Ruths, MD   Subjective   No chest pain, SOB. Remains in AF>>>episodes of RVR per  telemetry review>>asymptomatic.  Feels tired today. Also had some brady yesterday, but without symptoms of dizziness, presyncope or syncope.  Inpatient Medications    Scheduled Meds: . budesonide (PULMICORT) nebulizer solution  0.25 mg Nebulization BID  . diltiazem  120 mg Oral Daily  . docusate sodium  100 mg Oral BID  . gabapentin  100 mg Oral QHS  . insulin aspart  0-15 Units Subcutaneous TID WC  . insulin aspart  0-5 Units Subcutaneous QHS  . levalbuterol  0.63 mg Nebulization BID  . metoprolol tartrate  12.5 mg Oral BID  . nicotine  14 mg Transdermal Daily  . pantoprazole  40 mg Oral BID  . pravastatin  20 mg Oral QHS  . rivaroxaban  15 mg Oral QHS  . sertraline  50 mg Oral Daily  . sucralfate  1 g Oral TID WC & HS   Continuous Infusions: . ceFEPime (MAXIPIME) IV 2 g (05/14/20 0916)   PRN Meds: acetaminophen **OR** acetaminophen, albuterol, bisacodyl, HYDROcodone-acetaminophen, morphine injection, ondansetron **OR** ondansetron (ZOFRAN) IV, polyethylene glycol, zolpidem   Vital Signs    Vitals:   05/14/20 0034 05/14/20 0500 05/14/20 0532 05/14/20 0820  BP: 134/68  (!) 157/100   Pulse: 98     Resp: 15  16   Temp: 98.5 F (36.9 C)  97.9 F (36.6 C)   TempSrc: Oral  Oral   SpO2: 91%  94% 93%  Weight:  70.2 kg    Height:        Intake/Output Summary (Last 24 hours) at 05/14/2020 1011 Last data filed at 05/14/2020 0603 Gross per 24 hour  Intake 240 ml  Output 1100 ml  Net -860 ml   Filed Weights   05/12/20 0716 05/13/20 0616 05/14/20 0500  Weight: 70 kg 70.1 kg 70.2 kg    Physical Exam   General: Well developed, well nourished, NAD Neck: Negative for carotid bruits. No JVD Lungs:Clear to ausculation bilaterally. No wheezes, rales, or rhonchi. Breathing is  unlabored. Cardiovascular: Irregularly irregular with S1 S2. No murmurs Extremities: No edema. Radial pulses 2+ bilaterally Neuro: Alert and oriented. No focal deficits. No facial asymmetry. MAE spontaneously. Psych: Responds to questions appropriately with normal affect.    Labs    Chemistry Recent Labs  Lab 05/12/20 0646 05/13/20 0237 05/14/20 0604  NA 138 137 138  K 3.7 4.2 4.3  CL 98 101 100  CO2 31 30 30   GLUCOSE 145* 138* 167*  BUN 13 13 14   CREATININE 1.21* 1.19* 1.13*  CALCIUM 8.4* 8.2* 8.6*  GFRNONAA 43* 43* 46*  GFRAA 49* 50* 54*  ANIONGAP 9 6 8      Hematology Recent Labs  Lab 05/12/20 0646 05/13/20 0237 05/14/20 0604  WBC 9.8 12.1* 8.8  RBC 4.16 3.78* 4.12  HGB 11.8* 10.7* 11.8*  HCT 38.4 34.7* 38.1  MCV 92.3 91.8 92.5  MCH 28.4 28.3 28.6  MCHC 30.7 30.8 31.0  RDW 14.5 14.6 14.6  PLT 369 369 352    Cardiac EnzymesNo results for input(s): TROPONINI in the last 168 hours. No results for input(s): TROPIPOC in the last 168 hours.   BNP Recent Labs  Lab 05/09/20 1031  BNP 1,775.3*     DDimer No results for input(s): DDIMER in the last 168 hours.   Radiology  DG Chest Port 1 View  Result Date: 05/13/2020 CLINICAL DATA:  Follow-up pneumonia EXAM: PORTABLE CHEST 1 VIEW COMPARISON:  05/09/2020 FINDINGS: Cardiomegaly and aortic atherosclerosis as seen previously. Widespread pulmonary infiltrates persist but show some radiographic improvement. More dense consolidation in the right upper lobe seen previously is again noted but is also improving. No worsening or new finding. IMPRESSION: Improving widespread pulmonary infiltrates, most pronounced in the right upper lobe. No worsening or new finding. Electronically Signed   By: Nelson Chimes M.D.   On: 05/13/2020 08:44    Telemetry    Yesterday, rates were better controlled but with mild asymptomatic bradycardia,  Today, V rates are elevated  ECG    No new tracing as of 05/13/20 - Personally  Reviewed  Cardiac Studies   Echocardiogram 05/06/20:   1. Left ventricular ejection fraction, by estimation, is 50 to 55%. The  left ventricle has low normal function. The left ventricle has no regional  wall motion abnormalities. Left ventricular diastolic function could not  be evaluated.  2. Right ventricular systolic function is normal. The right ventricular  size is normal. There is normal pulmonary artery systolic pressure.  3. The mitral valve is normal in structure. No evidence of mitral valve  regurgitation. No evidence of mitral stenosis.  4. The aortic valve is normal in structure. Aortic valve regurgitation is  not visualized. No aortic stenosis is present.   Patient Profile     79 y.o. female with a hx PAF, COPD, PVD, PMR, hypertension, hyperlipidemia, DM, fibromyalgiaof who is being seen today for the evaluation of persistent atrial fibrillation. Admitted with pneumonia; echo shows normal LV function.  Assessment & Plan    1. Persistent atrial fibrillation: -Remains in AF>>>felt to be precipitated by PNA. Echocardiogram with normal LVEF. -Continue Xarelto Her metoprolol was decreased because of mild asymptomatic bradycardia yesterday.  V rates are subsequently elevated. I would advise that we increase metoprolol again to 25mg  BID.  I will given an additional dose now.  Continue to titrate medicines to keep HR goal < 100 bpm.  2. PNA: -Improving -Continue treatment per primary team   3. HTN: Stable No change required today  4. Tobacco use: -Reports cessation 3 weeks ago  5. Bradycardia Asymptomatic No indication for pacing  Cardiology to follow  Thompson Grayer MD, [facility] 05/14/2020 10:41 AM

## 2020-05-14 DIAGNOSIS — J189 Pneumonia, unspecified organism: Secondary | ICD-10-CM

## 2020-05-14 DIAGNOSIS — I495 Sick sinus syndrome: Secondary | ICD-10-CM

## 2020-05-14 LAB — CBC WITH DIFFERENTIAL/PLATELET
Abs Immature Granulocytes: 0.03 10*3/uL (ref 0.00–0.07)
Basophils Absolute: 0.1 10*3/uL (ref 0.0–0.1)
Basophils Relative: 1 %
Eosinophils Absolute: 0.2 10*3/uL (ref 0.0–0.5)
Eosinophils Relative: 2 %
HCT: 38.1 % (ref 36.0–46.0)
Hemoglobin: 11.8 g/dL — ABNORMAL LOW (ref 12.0–15.0)
Immature Granulocytes: 0 %
Lymphocytes Relative: 21 %
Lymphs Abs: 1.8 10*3/uL (ref 0.7–4.0)
MCH: 28.6 pg (ref 26.0–34.0)
MCHC: 31 g/dL (ref 30.0–36.0)
MCV: 92.5 fL (ref 80.0–100.0)
Monocytes Absolute: 0.4 10*3/uL (ref 0.1–1.0)
Monocytes Relative: 5 %
Neutro Abs: 6.2 10*3/uL (ref 1.7–7.7)
Neutrophils Relative %: 71 %
Platelets: 352 10*3/uL (ref 150–400)
RBC: 4.12 MIL/uL (ref 3.87–5.11)
RDW: 14.6 % (ref 11.5–15.5)
WBC: 8.8 10*3/uL (ref 4.0–10.5)
nRBC: 0 % (ref 0.0–0.2)

## 2020-05-14 LAB — BASIC METABOLIC PANEL
Anion gap: 8 (ref 5–15)
BUN: 14 mg/dL (ref 8–23)
CO2: 30 mmol/L (ref 22–32)
Calcium: 8.6 mg/dL — ABNORMAL LOW (ref 8.9–10.3)
Chloride: 100 mmol/L (ref 98–111)
Creatinine, Ser: 1.13 mg/dL — ABNORMAL HIGH (ref 0.44–1.00)
GFR calc Af Amer: 54 mL/min — ABNORMAL LOW (ref >60)
GFR calc non Af Amer: 46 mL/min — ABNORMAL LOW (ref >60)
Glucose, Bld: 167 mg/dL — ABNORMAL HIGH (ref 70–99)
Potassium: 4.3 mmol/L (ref 3.5–5.1)
Sodium: 138 mmol/L (ref 135–145)

## 2020-05-14 LAB — GLUCOSE, CAPILLARY
Glucose-Capillary: 156 mg/dL — ABNORMAL HIGH (ref 70–99)
Glucose-Capillary: 190 mg/dL — ABNORMAL HIGH (ref 70–99)
Glucose-Capillary: 141 mg/dL — ABNORMAL HIGH (ref 70–99)
Glucose-Capillary: 118 mg/dL — ABNORMAL HIGH (ref 70–99)

## 2020-05-14 MED ORDER — METOPROLOL TARTRATE 12.5 MG HALF TABLET
12.5000 mg | ORAL_TABLET | Freq: Two times a day (BID) | ORAL | Status: DC
  Administered 2020-05-14: 12.5 mg via ORAL
  Filled 2020-05-14: qty 1

## 2020-05-14 MED ORDER — METOPROLOL TARTRATE 25 MG PO TABS
25.0000 mg | ORAL_TABLET | Freq: Once | ORAL | Status: AC
  Administered 2020-05-14: 25 mg via ORAL
  Filled 2020-05-14: qty 1

## 2020-05-14 MED ORDER — METOPROLOL TARTRATE 25 MG PO TABS
25.0000 mg | ORAL_TABLET | Freq: Two times a day (BID) | ORAL | Status: DC
  Administered 2020-05-14 – 2020-05-16 (×4): 25 mg via ORAL
  Filled 2020-05-14 (×4): qty 1

## 2020-05-14 NOTE — Progress Notes (Signed)
Patient transported to OR, report given to RN. Patient assessment remained unchanged prior transporting.

## 2020-05-14 NOTE — Progress Notes (Signed)
PROGRESS NOTE  Kathleen Griffith DPO:[id number] DOB: 02-Aug-1941 DOA: 05/06/2020 PCP: No primary care provider on file.  HPI/Recap of past 24 hours: HPI from Dr Lorrin Mais is an 79 y.o. female with medical history significant ofPVD: PMR; HTN; HLD; DM with gastroparesis; fibromyalgia; and afib presenting with refractory PNA.She reports that she was doing fine. She flew to [place] to visit a friend. The day after returning home, she was in afib (hasn't been in that in over a year). She fell the night before she came home for the first time in over a year. Things just went from bad to worse. She fell at home 5 days later. She has no energy, takes everything she can do to do anything. She gets up on the side of the chair or bed and has to sit there for an hour or more to get the energy to get up. She can't fix meals. She drinks water. Yesterday, she ate one piece of toast all day. Not really SOB, only with conversation or moving around. +cough, productive of greenish sputum. No fever. She saw Dr. Coletta Memos on 5/5 and he sent her for CXR and ankle xray (from fall); he told her he thought she had atypical PNA and called in azithromycin.    Overnight, patient noted to be in RVR after beta-blocker was reduced due to pauses noted.  Today patient continues to be in RVR, denies any symptoms, but noted to be slightly lethargic, denies any palpitations, chest pain, abdominal pain, worsening cough, fever/chills, nausea/vomiting     Assessment/Plan: Principal Problem:   CAP (community acquired pneumonia) Active Problems:   Tobacco abuse   Essential hypertension   Hyperlipidemia   Atrial fibrillation with RVR (HCC)   DNR (do not resuscitate)   Palliative care encounter   CAP Currently afebrile, with resolved leukocytosis Currently saturating above 90% on room air Procalcitonin 0.95--> 0.12, downtrending Influenza, COVID-19 negative BC x2 NGTD Sputum culture consistent with  normal respiratory flora CTA chest showed extensive right upper lobe airspace opacity Chest x-ray showed initially right upper lobe pneumonia, repeat currently shows improved multifocal pneumonia Continue cefepime, completed 9 days of azithromycin Duo nebs, Pulmicort, supplemental O2 as needed Continuous pulse ox, telemetry  Persistent A. Fib/Noted pauses on telemetry Echo showed EF of 50 to 55%, no regional wall motion abnormality noted Cardiology on board, increased metoprolol to 25 mg twice daily, continue Cardizem, plan for possible cardioversion once pneumonia is treated as an outpt Continue Xarelto Telemetry to monitor closely for pauses while on CCB/BB  ?? Fluid overload/acute on chronic systolic HF Improved BNP elevated 1,775 Echo showed EF of 50 to 55%, no regional wall motion abnormality Cardiology on board, s/p IV diuresis, stopped due to rising Cr Daily BMP  Diabetes mellitus type 2 A1c 6.8 SSI, Accu-Cheks, hypoglycemic protocol  Hypertension Continue Lopressor, Cardizem  Hyperlipidemia Continue statins  Tobacco abuse Advised to quit Nicotine patch  [place] Patient is currently DNR, palliative consulted for further goals of care discussion         Malnutrition Type:      Malnutrition Characteristics:      Nutrition Interventions:       Estimated body mass index is 27.42 kg/m as calculated from the following:   Height as of this encounter: 5\' 3"  (1.6 m).   Weight as of this encounter: 70.2 kg.     Code Status: DNR  Family Communication: Discussed extensively with patient, plan to discuss with family if needed  Disposition Plan: Status  is: Inpatient  Remains inpatient appropriate because:IV treatments appropriate due to intensity of illness or inability to take PO   Dispo: The patient is from: Home              Anticipated d/c is to: Home              Anticipated d/c date is: 1 day on 05/15/20              Patient currently is not  medically stable to d/c.,  Requiring IV antibiotics, cardiology signing off   Consultants:  Cardiology  Procedures:  None  Antimicrobials:  Cefepime  DVT prophylaxis: Xarelto   Objective: Vitals:   05/14/20 0500 05/14/20 0532 05/14/20 0820 05/14/20 1222  BP:  (!) 157/100  (!) 158/96  Pulse:    76  Resp:  16  18  Temp:  97.9 F (36.6 C)  98 F (36.7 C)  TempSrc:  Oral  Oral  SpO2:  94% 93% 93%  Weight: 70.2 kg     Height:        Intake/Output Summary (Last 24 hours) at 05/14/2020 1543 Last data filed at 05/14/2020 0603 Gross per 24 hour  Intake 240 ml  Output 1100 ml  Net -860 ml   Filed Weights   05/12/20 0716 05/13/20 0616 05/14/20 0500  Weight: 70 kg 70.1 kg 70.2 kg    Exam:  General: NAD, lethargic  Cardiovascular: S1, S2 present  Respiratory:  Diminished breath sounds bilaterally  Abdomen: Soft, nontender, nondistended, bowel sounds present  Musculoskeletal: No bilateral pedal edema noted  Skin: Normal  Psychiatry: Normal mood   Data Reviewed: CBC: Recent Labs  Lab 05/10/20 0729 05/11/20 0330 05/12/20 0646 05/13/20 0237 05/14/20 0604  WBC 9.6 9.9 9.8 12.1* 8.8  NEUTROABS  --  7.0 7.1 8.9* 6.2  HGB 12.1 11.5* 11.8* 10.7* 11.8*  HCT 38.6 35.9* 38.4 34.7* 38.1  MCV 90.6 90.2 92.3 91.8 92.5  PLT 351 340 369 369 220   Basic Metabolic Panel: Recent Labs  Lab 05/08/20 1049 05/08/20 1049 05/10/20 0729 05/11/20 0330 05/12/20 0646 05/13/20 0237 05/14/20 0604  NA 137   < > 134* 135 138 137 138  K 4.0   < > 4.0 3.8 3.7 4.2 4.3  CL 105   < > 99 99 98 101 100  CO2 23   < > 24 25 31 30 30   GLUCOSE 178*   < > 125* 131* 145* 138* 167*  BUN 18   < > 16 19 13 13 14   CREATININE 1.05*   < > 1.12* 1.33* 1.21* 1.19* 1.13*  CALCIUM 8.4*   < > 8.5* 8.2* 8.4* 8.2* 8.6*  MG 1.7  --   --   --   --   --   --    < > = values in this interval not displayed.   GFR: Estimated Creatinine Clearance: 37.9 mL/min (A) (by C-G formula based on SCr of  1.13 mg/dL (H)). Liver Function Tests: No results for input(s): AST, ALT, ALKPHOS, BILITOT, PROT, ALBUMIN in the last 168 hours. No results for input(s): LIPASE, AMYLASE in the last 168 hours. No results for input(s): AMMONIA in the last 168 hours. Coagulation Profile: No results for input(s): INR, PROTIME in the last 168 hours. Cardiac Enzymes: No results for input(s): CKTOTAL, CKMB, CKMBINDEX, TROPONINI in the last 168 hours. BNP (last 3 results) No results for input(s): PROBNP in the last 8760 hours. HbA1C: No results for input(s): HGBA1C in  the last 72 hours. CBG: Recent Labs  Lab 05/13/20 1104 05/13/20 1609 05/13/20 2104 05/14/20 0558 05/14/20 1030  GLUCAP 211* 81 163* 156* 190*   Lipid Profile: No results for input(s): CHOL, HDL, LDLCALC, TRIG, CHOLHDL, LDLDIRECT in the last 72 hours. Thyroid Function Tests: No results for input(s): TSH, T4TOTAL, FREET4, T3FREE, THYROIDAB in the last 72 hours. Anemia Panel: No results for input(s): VITAMINB12, FOLATE, FERRITIN, TIBC, IRON, RETICCTPCT in the last 72 hours. Urine analysis:    Component Value Date/Time   COLORURINE STRAW (A) 12/27/2019 2333   APPEARANCEUR CLEAR 12/27/2019 2333   LABSPEC 1.006 12/27/2019 2333   PHURINE 8.0 12/27/2019 2333   GLUCOSEU NEGATIVE 12/27/2019 2333   HGBUR NEGATIVE 12/27/2019 2333   [place] NEGATIVE 12/27/2019 [place] 12/27/2019 2333   PROTEINUR 30 (A) 12/27/2019 2333   NITRITE NEGATIVE 12/27/2019 2333   LEUKOCYTESUR TRACE (A) 12/27/2019 2333   Sepsis Labs: @LABRCNTIP (procalcitonin:4,lacticidven:4)  ) Recent Results (from the past 240 hour(s))  Culture, blood (x 2)     Status: None   Collection Time: 05/06/20  9:05 AM   Specimen: BLOOD  Result Value Ref Range Status   Specimen Description BLOOD RIGHT ANTECUBITAL  Final   Special Requests   Final    BOTTLES DRAWN AEROBIC AND ANAEROBIC Blood Culture adequate volume   Culture   Final    NO GROWTH 5 DAYS Performed  at [facility], [place] [address] [place], [place] [address]    Report Status 05/11/2020 FINAL  Final  Culture, blood (x 2)     Status: None   Collection Time: 05/06/20  9:10 AM   Specimen: BLOOD RIGHT WRIST  Result Value Ref Range Status   Specimen Description BLOOD RIGHT WRIST  Final   Special Requests   Final    BOTTLES DRAWN AEROBIC AND ANAEROBIC Blood Culture adequate volume   Culture   Final    NO GROWTH 5 DAYS Performed at [facility], [place] [address] [place], [place] [address]    Report Status 05/11/2020 FINAL  Final  Respiratory Panel by RT PCR (Flu A&B, Covid) - Nasopharyngeal Swab     Status: None   Collection Time: 05/06/20  9:28 AM   Specimen: Nasopharyngeal Swab  Result Value Ref Range Status   SARS Coronavirus 2 by RT PCR NEGATIVE NEGATIVE Final    Comment: (NOTE) SARS-CoV-2 target nucleic acids are NOT DETECTED. The SARS-CoV-2 RNA is generally detectable in upper respiratoy specimens during the acute phase of infection. The lowest concentration of SARS-CoV-2 viral copies this assay can detect is 131 copies/mL. A negative result does not preclude SARS-Cov-2 infection and should not be used as the sole basis for treatment or other patient management decisions. A negative result may occur with  improper specimen collection/handling, submission of specimen other than nasopharyngeal swab, presence of viral mutation(s) within the areas targeted by this assay, and inadequate number of viral copies (<131 copies/mL). A negative result must be combined with clinical observations, patient history, and epidemiological information. The expected result is Negative. Fact Sheet for Patients:  PinkCheek.be Fact Sheet for Healthcare Providers:  GravelBags.it This test is not yet ap proved or cleared by the [place] FDA and  has been authorized for detection and/or diagnosis of SARS-CoV-2 by FDA under an  Emergency Use Authorization (EUA). This EUA will remain  in effect (meaning this test can be used) for the duration of the COVID-19 declaration under Section 564(b)(1) of the Act, 21  U.S.C. section 360bbb-3(b)(1), unless the authorization is terminated or revoked sooner.    Influenza A by PCR NEGATIVE NEGATIVE Final   Influenza B by PCR NEGATIVE NEGATIVE Final    Comment: (NOTE) The Xpert Xpress SARS-CoV-2/FLU/RSV assay is intended as an aid in  the diagnosis of influenza from Nasopharyngeal swab specimens and  should not be used as a sole basis for treatment. Nasal washings and  aspirates are unacceptable for Xpert Xpress SARS-CoV-2/FLU/RSV  testing. Fact Sheet for Patients: PinkCheek.be Fact Sheet for Healthcare Providers: GravelBags.it This test is not yet approved or cleared by the [place] FDA and  has been authorized for detection and/or diagnosis of SARS-CoV-2 by  FDA under an Emergency Use Authorization (EUA). This EUA will remain  in effect (meaning this test can be used) for the duration of the  Covid-19 declaration under Section 564(b)(1) of the Act, 21  U.S.C. section 360bbb-3(b)(1), unless the authorization is  terminated or revoked. Performed at [facility], [place] [address] [place], [place] [address]   Expectorated sputum assessment w rflx to resp cult     Status: None   Collection Time: 05/09/20 12:48 PM   Specimen: Expectorated Sputum  Result Value Ref Range Status   Specimen Description Expect. Sput  Final   Special Requests NONE  Final   Sputum evaluation   Final    THIS SPECIMEN IS ACCEPTABLE FOR SPUTUM CULTURE Performed at [facility], [address] [place], [place] [address]    Report Status 05/09/2020 FINAL  Final  Culture, respiratory     Status: None   Collection Time: 05/09/20 12:48 PM  Result Value Ref Range Status   Specimen Description Expect. Sput  Final   Special  Requests NONE Reflexed from [id number]  Final   Gram Stain   Final    RARE WBC PRESENT, PREDOMINANTLY PMN FEW SQUAMOUS EPITHELIAL CELLS PRESENT MODERATE GRAM NEGATIVE RODS FEW GRAM POSITIVE COCCI FEW GRAM POSITIVE RODS    Culture   Final    Consistent with normal respiratory flora. Performed at [facility], [place] [address], [place],  [address]    Report Status 05/11/2020 FINAL  Final      Studies: No results found.  Scheduled Meds: . budesonide (PULMICORT) nebulizer solution  0.25 mg Nebulization BID  . diltiazem  120 mg Oral Daily  . docusate sodium  100 mg Oral BID  . gabapentin  100 mg Oral QHS  . insulin aspart  0-15 Units Subcutaneous TID WC  . insulin aspart  0-5 Units Subcutaneous QHS  . levalbuterol  0.63 mg Nebulization BID  . metoprolol tartrate  25 mg Oral BID  . nicotine  14 mg Transdermal Daily  . pantoprazole  40 mg Oral BID  . pravastatin  20 mg Oral QHS  . rivaroxaban  15 mg Oral QHS  . sertraline  50 mg Oral Daily  . sucralfate  1 g Oral TID WC & HS    Continuous Infusions: . ceFEPime (MAXIPIME) IV 2 g (05/14/20 0916)     LOS: 8 days     Alma Friendly, MD [facility]  If 7PM-7AM, please contact night-coverage [email or web address] 05/14/2020, 3:43 PM

## 2020-05-15 ENCOUNTER — Inpatient Hospital Stay (HOSPITAL_COMMUNITY): Payer: Medicare HMO

## 2020-05-15 DIAGNOSIS — R252 Cramp and spasm: Secondary | ICD-10-CM

## 2020-05-15 LAB — BASIC METABOLIC PANEL
Anion gap: 7 (ref 5–15)
BUN: 15 mg/dL (ref 8–23)
CO2: 29 mmol/L (ref 22–32)
Calcium: 8.8 mg/dL — ABNORMAL LOW (ref 8.9–10.3)
Chloride: 101 mmol/L (ref 98–111)
Creatinine, Ser: 1.19 mg/dL — ABNORMAL HIGH (ref 0.44–1.00)
GFR calc Af Amer: 50 mL/min — ABNORMAL LOW (ref >60)
GFR calc non Af Amer: 43 mL/min — ABNORMAL LOW (ref >60)
Glucose, Bld: 143 mg/dL — ABNORMAL HIGH (ref 70–99)
Potassium: 4.2 mmol/L (ref 3.5–5.1)
Sodium: 137 mmol/L (ref 135–145)

## 2020-05-15 LAB — CBC WITH DIFFERENTIAL/PLATELET
Abs Immature Granulocytes: 0.02 10*3/uL (ref 0.00–0.07)
Basophils Absolute: 0.1 10*3/uL (ref 0.0–0.1)
Basophils Relative: 1 %
Eosinophils Absolute: 0.2 10*3/uL (ref 0.0–0.5)
Eosinophils Relative: 2 %
HCT: 37.8 % (ref 36.0–46.0)
Hemoglobin: 11.7 g/dL — ABNORMAL LOW (ref 12.0–15.0)
Immature Granulocytes: 0 %
Lymphocytes Relative: 25 %
Lymphs Abs: 2.4 10*3/uL (ref 0.7–4.0)
MCH: 28.3 pg (ref 26.0–34.0)
MCHC: 31 g/dL (ref 30.0–36.0)
MCV: 91.5 fL (ref 80.0–100.0)
Monocytes Absolute: 0.6 10*3/uL (ref 0.1–1.0)
Monocytes Relative: 7 %
Neutro Abs: 6.1 10*3/uL (ref 1.7–7.7)
Neutrophils Relative %: 65 %
Platelets: 329 10*3/uL (ref 150–400)
RBC: 4.13 MIL/uL (ref 3.87–5.11)
RDW: 14.5 % (ref 11.5–15.5)
WBC: 9.5 10*3/uL (ref 4.0–10.5)
nRBC: 0 % (ref 0.0–0.2)

## 2020-05-15 LAB — GLUCOSE, CAPILLARY
Glucose-Capillary: 141 mg/dL — ABNORMAL HIGH (ref 70–99)
Glucose-Capillary: 197 mg/dL — ABNORMAL HIGH (ref 70–99)
Glucose-Capillary: 144 mg/dL — ABNORMAL HIGH (ref 70–99)
Glucose-Capillary: 151 mg/dL — ABNORMAL HIGH (ref 70–99)

## 2020-05-15 LAB — MAGNESIUM: Magnesium: 1.8 mg/dL (ref 1.7–2.4)

## 2020-05-15 MED ORDER — ADULT MULTIVITAMIN W/MINERALS CH
1.0000 | ORAL_TABLET | Freq: Every day | ORAL | Status: DC
  Administered 2020-05-16 – 2020-05-17 (×2): 1 via ORAL
  Filled 2020-05-15 (×2): qty 1

## 2020-05-15 MED ORDER — GLUCERNA SHAKE PO LIQD
237.0000 mL | Freq: Three times a day (TID) | ORAL | Status: DC
  Administered 2020-05-15: 237 mL via ORAL

## 2020-05-15 NOTE — Progress Notes (Signed)
Physical Therapy Treatment Patient Details Name: Kathleen Griffith MRN: [id number] DOB: Nov 05, 1941 Today's Date: 05/15/2020    History of Present Illness 79 y.o. female with a hx PAF, COPD, PVD, PMR, hypertension, hyperlipidemia, DM, fibromyalgia admitted to [facility] on 5/8 with PNA, recent falls, afib.    PT Comments    Pt expresses eagerness to d/c home to her cat upon PT arrival to room, so is motivated to mobilize today. Pt ambulated room distance with very increased time and intermittent standing rest breaks due to involuntary LE jerking during mobility. Per pt, this has been happening for the last 2 days while in acute setting. RN and NT notified. Overall, pt requires very increased time and effort for all mobility, recommending 24/7 assist at home (son lives across the hall) and HHPT to maximize pt mobility and safety.   HRmax: 137 bpm SpO2 on RA: 90% and greater    Follow Up Recommendations  Home health PT;Supervision/Assistance - 24 hour     Equipment Recommendations  None recommended by PT    Recommendations for Other Services       Precautions / Restrictions Precautions Precautions: Fall Precaution Comments: involuntary LE jerking during mobility - new on 5/17 and pt reports it has been going on for the past 2 days Restrictions Weight Bearing Restrictions: No    Mobility  Bed Mobility Overal bed mobility: Needs Assistance Bed Mobility: Supine to Sit     Supine to sit: Min guard;HOB elevated     General bed mobility comments: min guard for safety, Increased time and effort with use of significant HOB elevation and bedrails to perform.  Transfers Overall transfer level: Needs assistance Equipment used: Rolling walker (2 wheeled) Transfers: Sit to/from Stand Sit to Stand: Min guard;From elevated surface         General transfer comment: for safety, increased time to rise and steady with good hand placement when rising. Pt with involuntary LE jerking upon  standing, taking several seconds to subside, with no overt LOB.  Ambulation/Gait Ambulation/Gait assistance: Min guard Gait Distance (Feet): 10 Feet Assistive device: Rolling walker (2 wheeled) Gait Pattern/deviations: Step-through pattern;Decreased stride length;Trunk flexed Gait velocity: decr   General Gait Details: Very slow gait, with intermittent pauses for LE involuntary jerking lasting several seconds at a time. Verbal cuing for placement in RW, upright posture. Recliner kept close by in case pt needed to sit quickly. HR quite variable, up to 130s bpm during mobility, with SpO2 90% and greater on RA.   Stairs             Wheelchair Mobility    Modified Rankin (Stroke Patients Only)       Balance Overall balance assessment: Needs assistance Sitting-balance support: Feet supported Sitting balance-Leahy Scale: Fair     Standing balance support: Bilateral upper extremity supported;During functional activity Standing balance-Leahy Scale: Poor Standing balance comment: reliant on external assist                            Cognition Arousal/Alertness: Awake/alert Behavior During Therapy: WFL for tasks assessed/performed Overall Cognitive Status: Within Functional Limits for tasks assessed                                        Exercises General Exercises - Lower Extremity Heel Slides: AROM;Both;5 reps;Supine Straight Leg Raises: AAROM;Both;5 reps;Supine    General  Comments General comments (skin integrity, edema, etc.): pt on 1L O2 with O2 WNL; HR max 100      Pertinent Vitals/Pain Pain Assessment: Faces Faces Pain Scale: Hurts a little bit Pain Location: generalized Pain Descriptors / Indicators: Discomfort Pain Intervention(s): Limited activity within patient's tolerance;Monitored during session;Repositioned    Home Living                      Prior Function            PT Goals (current goals can now be found  in the care plan section) Acute Rehab PT Goals Patient Stated Goal: go home once I am better PT Goal Formulation: With patient Time For Goal Achievement: 05/23/20 Potential to Achieve Goals: Good Progress towards PT goals: Progressing toward goals    Frequency    Min 3X/week      PT Plan Current plan remains appropriate    Co-evaluation              AM-PAC PT "6 Clicks" Mobility   Outcome Measure  Help needed turning from your back to your side while in a flat bed without using bedrails?: A Little Help needed moving from lying on your back to sitting on the side of a flat bed without using bedrails?: A Little Help needed moving to and from a bed to a chair (including a wheelchair)?: A Little Help needed standing up from a chair using your arms (e.g., wheelchair or bedside chair)?: A Little Help needed to walk in hospital room?: A Little Help needed climbing 3-5 steps with a railing? : A Lot 6 Click Score: 17    End of Session Equipment Utilized During Treatment: Gait belt Activity Tolerance: Patient limited by fatigue Patient left: with call bell/phone within reach;in chair(pt verbalizes she will press call button and wait for RN staff assist prior to mobilizing back to bed) Nurse Communication: Mobility status PT Visit Diagnosis: Difficulty in walking, not elsewhere classified (R26.2);Muscle weakness (generalized) (M62.81);History of falling (Z91.81)     Time: 8466-5993 PT Time Calculation (min) (ACUTE ONLY): 30 min  Charges:  $Gait Training: 8-22 mins $Therapeutic Activity: 8-22 mins                    Lillianna Sabel E, PT [place] Pager [phone]  Office [place] 05/15/2020, 11:59 AM

## 2020-05-15 NOTE — NC FL2 (Signed)
[place] LEVEL OF CARE SCREENING TOOL     IDENTIFICATION  Patient Name: Kathleen Griffith Birthdate: Sep 12, 1941 Sex: female Admission Date (Current Location): 05/06/2020  [facility] and [place] Number:  Herbalist and Address:  [facility] [place]. [facility], [place] [address] [place], [place] [address]      Provider Number: [id number]  Attending Physician Name and Address:  Alma Friendly, MD  Relative Name and Phone Number:       Current Level of Care: Hospital Recommended Level of Care: [place] Prior Approval Number:    Date Approved/Denied:   PASRR Number: [id number]  Discharge Plan: SNF    Current Diagnoses: Patient Active Problem List   Diagnosis Date Noted  . Palliative care encounter   . DNR (do not resuscitate) 05/07/2020  . CAP (community acquired pneumonia) 05/06/2020  . 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 Allergic rhinitis due to pollen 10/19/2019  . Pruritus 06/17/2019  . Other fatigue 02/24/2019  . Melanoma ([place]) 12/10/2018  . Atrial fibrillation with RVR ([place]) 09/16/2018  . Depression 12/03/2017  . Normal coronary arteries 12/03/2017  . 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 Polymyalgia rheumatica ([place]) 12/03/2017  . NSTEMI (non-ST elevated myocardial infarction) ([place]) 06/02/2017  . PAF (paroxysmal atrial fibrillation) ([place]) 03/20/2016  . Insulin dependent diabetes mellitus 03/20/2016  . GERD (gastroesophageal reflux disease) 03/20/2016  . Anemia, iron deficiency   . Angiodysplasia of cecum   . Colon polyp   . 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 Chronic anticoagulation 07/28/2015  . Family history of colon cancer 07/28/2015  . Carotid artery disease ([place]) 04/04/2015  . Tobacco abuse 04/04/2015  . Essential hypertension 04/04/2015  . Hyperlipidemia 04/04/2015    Orientation RESPIRATION BLADDER Height & Weight     Self, Situation, Time, Place  Normal  Continent, External catheter Weight: 150 lb 5.7 oz (68.2 kg) Height:  5\' 3"  (160 cm)  BEHAVIORAL SYMPTOMS/MOOD NEUROLOGICAL BOWEL NUTRITION STATUS      Continent Diet(see discharge summary)  AMBULATORY STATUS COMMUNICATION OF NEEDS Skin   Extensive Assist Verbally Other (Comment)(generalized ecchymosis)                       Personal Care Assistance Level of Assistance  Bathing, Dressing, Feeding Bathing Assistance: Maximum assistance Feeding assistance: Independent Dressing Assistance: Maximum assistance     Functional Limitations Info  Sight, Hearing, Speech Sight Info: Impaired Hearing Info: Adequate Speech Info: Adequate    SPECIAL CARE FACTORS FREQUENCY  PT (By licensed PT), OT (By licensed OT)     PT Frequency: 5x week OT Frequency: 5x week            Contractures Contractures Info: Not present    Additional Factors Info  Code Status, Allergies, Insulin Sliding Scale, Psychotropic Code Status Info: DNR Allergies Info: Anoro Ellipta (Umeclidinium-vilanterol), Losartan, Norvasc (Amlodipine Besylate), Pregabalin, Bupropion, Eliquis (Apixaban), Metoclopramide, Lisinopril, Metformin, Metformin And Related Psychotropic Info: sertraline (ZOLOFT) tablet 50 mg daily PO Insulin Sliding Scale Info: insulin aspart (novoLOG) injection 0-15 Units 3x daily with meals; insulin aspart (novoLOG) injection 0-5 Units daily at bedtime       Current Medications (05/15/2020):  This is the current hospital active medication list Current Facility-Administered Medications  Medication Dose Route Frequency Provider Last Rate Last Admin  . acetaminophen (TYLENOL) tablet 650 mg  650 mg Oral Q6H PRN Karmen Bongo, MD       Or  . acetaminophen (TYLENOL) suppository 650  mg  650 mg Rectal Q6H PRN Karmen Bongo, MD      . albuterol (PROVENTIL) (2.5 MG/3ML) 0.083% nebulizer solution 2.5 mg  2.5 mg Inhalation Q4H PRN Karmen Bongo, MD   2.5 mg at 05/09/20 1109  . bisacodyl (DULCOLAX) EC  tablet 5 mg  5 mg Oral Daily PRN Karmen Bongo, MD      . budesonide (PULMICORT) nebulizer solution 0.25 mg  0.25 mg Nebulization BID Alma Friendly, MD   0.25 mg at 05/15/20 8882  . ceFEPIme (MAXIPIME) 2 g in sodium chloride 0.9 % 100 mL IVPB  2 g Intravenous Q12H Efraim Kaufmann, RPH 200 mL/hr at 05/15/20 0904 2 g at 05/15/20 0904  . diltiazem (CARDIZEM CD) 24 hr capsule 120 mg  120 mg Oral Daily Lelon Perla, MD   120 mg at 05/15/20 0856  . docusate sodium (COLACE) capsule 100 mg  100 mg Oral BID Karmen Bongo, MD   100 mg at 05/15/20 0856  . feeding supplement (GLUCERNA SHAKE) (GLUCERNA SHAKE) liquid 237 mL  237 mL Oral TID BM Alma Friendly, MD      . gabapentin (NEURONTIN) capsule 100 mg  100 mg Oral Ivery Quale, MD   100 mg at 05/14/20 2146  . HYDROcodone-acetaminophen (NORCO/VICODIN) 5-325 MG per tablet 1-2 tablet  1-2 tablet Oral Q4H PRN Karmen Bongo, MD   1 tablet at 05/13/20 1430  . insulin aspart (novoLOG) injection 0-15 Units  0-15 Units Subcutaneous TID WC Karmen Bongo, MD   3 Units at 05/15/20 1126  . insulin aspart (novoLOG) injection 0-5 Units  0-5 Units Subcutaneous QHS Karmen Bongo, MD   2 Units at 05/11/20 2239  . levalbuterol (XOPENEX) nebulizer solution 0.63 mg  0.63 mg Nebulization BID Alma Friendly, MD   0.63 mg at 05/15/20 8003  . metoprolol tartrate (LOPRESSOR) tablet 25 mg  25 mg Oral BID Thompson Grayer, MD   25 mg at 05/15/20 0856  . morphine 2 MG/ML injection 2 mg  2 mg Intravenous Q2H PRN Karmen Bongo, MD      . multivitamin with minerals tablet 1 tablet  1 tablet Oral Daily Alma Friendly, MD      . nicotine (NICODERM CQ - dosed in mg/24 hours) patch 14 mg  14 mg Transdermal Daily Karmen Bongo, MD      . ondansetron [facility]) tablet 4 mg  4 mg Oral Q6H PRN Karmen Bongo, MD       Or  . ondansetron [facility]) injection 4 mg  4 mg Intravenous Q6H PRN Karmen Bongo, MD   4 mg at 05/10/20 1042  . pantoprazole  (PROTONIX) EC tablet 40 mg  40 mg Oral BID Karmen Bongo, MD   40 mg at 05/15/20 0857  . polyethylene glycol (MIRALAX / GLYCOLAX) packet 17 g  17 g Oral Daily PRN Karmen Bongo, MD      . pravastatin (PRAVACHOL) tablet 20 mg  20 mg Oral Ivery Quale, MD   20 mg at 05/14/20 2146  . Rivaroxaban (XARELTO) tablet 15 mg  15 mg Oral QHS Karren Cobble, [place]   15 mg at 05/14/20 2146  . sertraline (ZOLOFT) tablet 50 mg  50 mg Oral Daily Karmen Bongo, MD   50 mg at 05/15/20 0856  . sucralfate (CARAFATE) tablet 1 g  1 g Oral TID WC & HS Karmen Bongo, MD   1 g at 05/15/20 0616  . zolpidem (AMBIEN) tablet 5 mg  5 mg Oral QHS  PRN Karmen Bongo, MD         Discharge Medications: Please see discharge summary for a list of discharge medications.  Relevant Imaging Results:  Relevant Lab Results:   Additional Information SS#[id number] [facility], [place]

## 2020-05-15 NOTE — Social Work (Signed)
Authorization requested through [facility] Medicare. Ref #[id number].   [facility] SNF reviewing pt clinicals. Additional clinicals sent.  Westley Hummer, MSW, [place] [facility]

## 2020-05-15 NOTE — Progress Notes (Signed)
Occupational Therapy Treatment Patient Details Name: Kathleen Griffith MRN: [id number] DOB: [birth year] Today's Date: 05/15/2020    History of present illness 79 y.o. female with a hx PAF, COPD, PVD, PMR, hypertension, hyperlipidemia, DM, fibromyalgia admitted to [facility] on 5/8 with PNA, recent falls, afib.   OT comments  Pt making gradual progress towards OT goals this session. Session focus on education related to ECS as pt continues to present with decreased activity tolerance and increased fatigue. Pt reports feeling "unmotivated." Pt reports son lives across hall from pt and can assist with meals in evenings. Pt reports having rollator and able to teach back transfer to TTB. Provided ECS handout to increase carryover of strategies. Continue to recommend HHOT at time of DC. Will continue to follow acutely for OT needs.    Follow Up Recommendations  Home health OT    Equipment Recommendations  Tub/shower bench    Recommendations for Other Services      Precautions / Restrictions Precautions Precautions: Fall Restrictions Weight Bearing Restrictions: No       Mobility Bed Mobility               General bed mobility comments: pt declined OOB mobility; session focus on education  Transfers                 General transfer comment: pt declined OOB secondary to fatigue    Balance                                           ADL either performed or assessed with clinical judgement   ADL Overall ADL's : Needs assistance/impaired   Eating/Feeding Details (indicate cue type and reason): education on asking son to assist with meals d/t decreased activity tolerance   Grooming Details (indicate cue type and reason): education on sitting down for ADLs as ECS                   Toilet Transfer Details (indicate cue type and reason): education provided on ECS related to toileting with rollator       Tub/Shower Transfer Details (indicate cue type and  reason): education provided on utilizing TTB as ECS for showering   General ADL Comments: pt continues to present with increased fatigued and decreased activity tolerance impacting pts ability to engage in BADLs. Session focus on ECS;provided handout to increase carryover     Vision       Perception     Praxis      Cognition Arousal/Alertness: Awake/alert Behavior During Therapy: WFL for tasks assessed/performed;Flat affect Overall Cognitive Status: Within Functional Limits for tasks assessed                                          Exercises     Shoulder Instructions       General Comments pt on 1L O2 with O2 WNL; HR max 100    Pertinent Vitals/ Pain       Pain Assessment: Faces Faces Pain Scale: Hurts a little bit Pain Location: general malaise Pain Descriptors / Indicators: Discomfort Pain Intervention(s): Monitored during session  Home Living  Prior Functioning/Environment              Frequency  Min 2X/week        Progress Toward Goals  OT Goals(current goals can now be found in the care plan section)  Progress towards OT goals: Not progressing toward goals - comment(fatigue and self limiting behaviors)  Acute Rehab OT Goals Patient Stated Goal: go home to her cat OT Goal Formulation: With patient Time For Goal Achievement: 05/23/20 Potential to Achieve Goals: Good  Plan Discharge plan remains appropriate    Co-evaluation                 AM-PAC OT "6 Clicks" Daily Activity     Outcome Measure   Help from another person eating meals?: None Help from another person taking care of personal grooming?: A Little Help from another person toileting, which includes using toliet, bedpan, or urinal?: A Lot Help from another person bathing (including washing, rinsing, drying)?: A Lot Help from another person to put on and taking off regular upper body clothing?: A  Little Help from another person to put on and taking off regular lower body clothing?: A Lot 6 Click Score: 16    End of Session Equipment Utilized During Treatment: Oxygen;Other (comment)(1L)  OT Visit Diagnosis: Unsteadiness on feet (R26.81);Muscle weakness (generalized) (M62.81);History of falling (Z91.81)   Activity Tolerance Patient limited by fatigue   Patient Left in bed;with call bell/phone within reach;with bed alarm set   Nurse Communication          Time: [phone] OT Time Calculation (min): 8 min  Charges: OT General Charges $OT Visit: 1 Visit OT Treatments $Self Care/Home Management : 8-22 mins  Lanier Clam., COTA/L Acute Rehabilitation Services [phone] [place] 05/15/2020, 8:37 AM

## 2020-05-15 NOTE — Progress Notes (Signed)
EEG Completed; Results Pending  

## 2020-05-15 NOTE — TOC Progression Note (Addendum)
Transition of Care [facility]) - Progression Note    Patient Details  Name: Kathleen Griffith MRN: [id number] Date of Birth: 1941/03/13  [facility] CM/SW Contact  [name] [name], RN Phone Number: 05/15/2020, 1:13 PM  Clinical Narrative:     See previous note. Patient would like to go to SNF for short term rehab. Her preference is [facility] , but willing to be faxed to surrounding areas.   [name] with [facility] aware   SNF bed offers given to patient, patient accepted [name]   Expected Discharge Plan: [place] Barriers to Discharge: Continued Medical Work up  Expected Discharge Plan and Services Expected Discharge Plan: [place]   Discharge Planning Services: CM Consult Post Acute Care Choice: [place] Living arrangements for the past 2 months: Apartment Expected Discharge Date: 05/14/20               DME Arranged: N/A DME Agency: NA       HH Arranged: NA HH Agency: [place] Date [facility] Agency Contacted: 05/09/20 Time [place]: 1414 Representative spoke with at [place]: Tommi Rumps awaiting call back   Social Determinants of Health (SDOH) Interventions    Readmission Risk Interventions No flowsheet data found.

## 2020-05-15 NOTE — Progress Notes (Signed)
Progress Note  Patient Name: Kathleen Griffith Date of Encounter: 05/15/2020  Primary Cardiologist: Kirk Ruths, MD   Subjective    79 yo with PAF, COPD, PVD,  Admitted with pneumonia , hx of falls and atrial fib  Echo shows normal LV EF,   Has ambulated with PT today .  She has sudden "muscle jerks" that may be contributing to her hx of frequent falls .   Inpatient Medications    Scheduled Meds: . budesonide (PULMICORT) nebulizer solution  0.25 mg Nebulization BID  . diltiazem  120 mg Oral Daily  . docusate sodium  100 mg Oral BID  . gabapentin  100 mg Oral QHS  . insulin aspart  0-15 Units Subcutaneous TID WC  . insulin aspart  0-5 Units Subcutaneous QHS  . levalbuterol  0.63 mg Nebulization BID  . metoprolol tartrate  25 mg Oral BID  . nicotine  14 mg Transdermal Daily  . pantoprazole  40 mg Oral BID  . pravastatin  20 mg Oral QHS  . rivaroxaban  15 mg Oral QHS  . sertraline  50 mg Oral Daily  . sucralfate  1 g Oral TID WC & HS   Continuous Infusions: . ceFEPime (MAXIPIME) IV 2 g (05/15/20 0904)   PRN Meds: acetaminophen **OR** acetaminophen, albuterol, bisacodyl, HYDROcodone-acetaminophen, morphine injection, ondansetron **OR** ondansetron (ZOFRAN) IV, polyethylene glycol, zolpidem   Vital Signs    Vitals:   05/14/20 2132 05/15/20 0557 05/15/20 0626 05/15/20 0833  BP: 127/87 112/87    Pulse: 76 93    Resp: 18 18    Temp: 98.8 F (37.1 C) 97.7 F (36.5 C)    TempSrc: Oral Oral    SpO2: 96% 96%  97%  Weight:   68.2 kg   Height:        Intake/Output Summary (Last 24 hours) at 05/15/2020 1005 Last data filed at 05/15/2020 0800 Gross per 24 hour  Intake 360 ml  Output 1400 ml  Net -1040 ml   Last 3 Weights 05/15/2020 05/14/2020 05/13/2020  Weight (lbs) 150 lb 5.7 oz 154 lb 12.2 oz 154 lb 8.7 oz  Weight (kg) 68.2 kg 70.2 kg 70.1 kg      Telemetry    Afib with rapid vent rate.  - Personally Reviewed  ECG     - Personally Reviewed  Physical Exam     GEN: elderly , somewhat frail female,    Neck: No JVD Cardiac:  irreg. Irreg.  Respiratory:  rhonchi and reduced breath sounds rightl lower lobe GI: Soft, nontender, non-distended  MS: No edema; No deformity. Neuro:  Nonfocal  Psych: Normal affect   Labs    High Sensitivity Troponin:  No results for input(s): TROPONINIHS in the last 720 hours.    Chemistry Recent Labs  Lab 05/13/20 0237 05/14/20 0604 05/15/20 0158  NA 137 138 137  K 4.2 4.3 4.2  CL 101 100 101  CO2 30 30 29   GLUCOSE 138* 167* 143*  BUN 13 14 15   CREATININE 1.19* 1.13* 1.19*  CALCIUM 8.2* 8.6* 8.8*  GFRNONAA 43* 46* 43*  GFRAA 50* 54* 50*  ANIONGAP 6 8 7      Hematology Recent Labs  Lab 05/13/20 0237 05/14/20 0604 05/15/20 0158  WBC 12.1* 8.8 9.5  RBC 3.78* 4.12 4.13  HGB 10.7* 11.8* 11.7*  HCT 34.7* 38.1 37.8  MCV 91.8 92.5 91.5  MCH 28.3 28.6 28.3  MCHC 30.8 31.0 31.0  RDW 14.6 14.6 14.5  PLT 369 352 329  BNP Recent Labs  Lab 05/09/20 1031  BNP 1,775.3*     DDimer No results for input(s): DDIMER in the last 168 hours.   Radiology    No results found.  Cardiac Studies     Patient Profile     79 y.o. female with atrial fib, COPD, frequent falls, admitted with pneumonia   Assessment & Plan      1. Persistent atrial fibrillation: - likely precipitated by PNA - rate generally controlled - on Xarelto - BB dose up and down, now on home dose Lopressor 25 mg bid - HR goal < 100 bpm Cont current meds.  HR is up currently due to the fact that she just got through walking .   2. PNA: - ABX per IM  3. HTN: - BP 112-158/74-100 BP seems to be well controlled at this point  4. Tobacco use: - quit x 3 weeks, needs continued cessation  5. Bradycardia  will cont to adjust meds as needed.          For questions or updates, please contact Time Please consult [email or web address] for contact info under        Signed, Mertie Moores, MD  05/15/2020, 10:05 AM

## 2020-05-15 NOTE — Progress Notes (Signed)
HPI: FU atrial fibrillation. Abdominal ultrasound May 2016 showed no aneurysm. Patient admitted with non-ST elevation myocardial infarction in June 2018. Cardiac catheterization revealed a 20% proximal to mid LAD and normal left ventricular end-diastolic pressure. Echocardiogram showed ejection fraction 19-62%, grade 2 diastolic dysfunction, mild tricuspid regurgitation, severely elevated pulmonary pressure. Sludge noted LV apex. 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 Was noted to have intermittent atrial fibrillation;felt to be secondary to stress of sepsis.Carotid Dopplers November 2020 showed 1 to 39% bilateral stenosis.  Echocardiogram May 2021 showed normal LV function.  Patient recently admitted with pneumonia.  She also was noted to be back in atrial fibrillation.  She was managed with rate control.  Plan was to proceed with cardioversion once her pneumonia was treated.  Sincelast seen, her pneumonia has improved since she was hospitalized.  She denies dyspnea, chest pain, palpitations or syncope.  She does have some fatigue.   Current Outpatient Medications  Medication Sig Dispense Refill  . acetaminophen (TYLENOL) 650 MG CR tablet Take 1,300 mg by mouth every 8 (eight) hours as needed for pain.    Marland Kitchen acidophilus (RISAQUAD) CAPS capsule Take 1 capsule by mouth daily.    Marland Kitchen albuterol (VENTOLIN HFA) 108 (90 Base) MCG/ACT inhaler Inhale 2 puffs into the lungs every 4 (four) hours as needed.    Marland Kitchen alendronate (FOSAMAX) 70 MG tablet Take 70 mg by mouth once a week.     . B-D UF III MINI PEN NEEDLES 31G X 5 MM MISC USE WITH LANTUS EVERY NIGHT AT BEDTIME 100 each 3  . budesonide (PULMICORT) 0.25 MG/2ML nebulizer solution Take 2 mLs (0.25 mg total) by nebulization 2 (two) times daily. 120 mL 0  . diltiazem (CARDIZEM CD) 120 MG 24 hr  capsule Take 1 capsule (120 mg total) by mouth daily. 30 capsule 0  . gabapentin (NEURONTIN) 100 MG capsule Take 1 capsule (100 mg total) by mouth at bedtime.    Marland Kitchen glucose blood (ONETOUCH ULTRA) test strip Use to test blood sugars 2 times daily. 100 each 12  . insulin glargine (LANTUS SOLOSTAR) 100 UNIT/ML Solostar Pen Inject 20 Units into the skin daily.    Marland Kitchen levalbuterol (XOPENEX) 0.63 MG/3ML nebulizer solution Take 3 mLs (0.63 mg total) by nebulization 2 (two) times daily. 180 mL 0  . metoprolol tartrate (LOPRESSOR) 25 MG tablet Take 1 tablet (25 mg total) by mouth every 8 (eight) hours. 90 tablet 0  . Multiple Vitamins-Minerals (CENTRUM SILVER 50+WOMEN PO) Take 1 tablet by mouth daily.    . nicotine (NICODERM CQ - DOSED IN MG/24 HOURS) 14 mg/24hr patch Place 1 patch (14 mg total) onto the skin daily. 28 patch 0  . nitroGLYCERIN (NITROSTAT) 0.3 MG SL tablet Place 1 tablet (0.3 mg total) under the tongue every 5 (five) minutes as needed for chest pain. 100 tablet 1  . omeprazole (PRILOSEC) 20 MG capsule Take 2 capsules (40 mg total) by mouth 2 (two) times daily. KEEP 06/01/20 OFFICE VISIT FOR FURTHER REFILLS (Patient taking differently: Take 40 mg by mouth 2 (two) times daily. ) 120 capsule 1  . pravastatin (PRAVACHOL) 20 MG tablet Take 20 mg by mouth at bedtime.    . sertraline (ZOLOFT) 100 MG tablet Take 50 mg by mouth daily.    . sucralfate (CARAFATE) 1 g tablet Take 1 tablet (1 g total) by mouth 4 (four)  times daily -  with meals and at bedtime. 120 tablet 2  . VITAMIN D PO Take 1 capsule by mouth daily.    Alveda Reasons 20 MG TABS tablet Take 20 mg by mouth at bedtime.     No current facility-administered medications for this visit.     Past Medical History:  Diagnosis Date  . Anemia    microcytic   . Arthritis   . Atrial fibrillation ([place])   . Cataract    REMOVED BILATERAL  . Diabetes mellitus ([place])   . Diverticulosis   . Duodenitis   . Fibromyalgia   . Gastric polyp   .  Gastroparesis   . Hyperlipidemia   . Hypertension   . Kidney stones   . Melanoma ([place])   . Microcytic anemia   . PMR (polymyalgia rheumatica) ([facility])   . PVD (peripheral vascular disease) ([place])   . Renal cyst   . Tubular adenoma of colon     Past Surgical History:  Procedure Laterality Date  . APPENDECTOMY    . BREAST BIOPSY    . CESAREAN SECTION    . COLONOSCOPY    . COLONOSCOPY WITH PROPOFOL N/A 12/26/2015   Procedure: COLONOSCOPY WITH PROPOFOL;  Surgeon: Manus Gunning, MD;  Location: [facility];  Service: Gastroenterology;  Laterality: N/A;  . ESOPHAGOGASTRODUODENOSCOPY (EGD) WITH PROPOFOL N/A 12/26/2015   Procedure: ESOPHAGOGASTRODUODENOSCOPY (EGD) WITH PROPOFOL;  Surgeon: Manus Gunning, MD;  Location: [facility];  Service: Gastroenterology;  Laterality: N/A;  . LEFT HEART CATH AND CORONARY ANGIOGRAPHY N/A 06/03/2017   Procedure: Left Heart Cath and Coronary Angiography;  Surgeon: Martinique, Peter M, MD;  Location: [facility];  Service: Cardiovascular;  Laterality: N/A;  . POLYPECTOMY      Social History   Socioeconomic History  . Marital status: Widowed    Spouse name: Not on file  . Number of children: 1  . Years of education: Not on file  . Highest education level: Not on file  Occupational History  . Not on file  Tobacco Use  . Smoking status: Former Smoker    Packs/day: 1.00    Years: 60.00    Pack years: 60.00    Types: Cigarettes  . Smokeless tobacco: Never Used  Substance and Sexual Activity  . Alcohol use: No    Alcohol/week: 0.0 standard drinks  . Drug use: No  . Sexual activity: Not on file  Other Topics Concern  . Not on file  Social History Narrative  . Not on file   Social Determinants of Health   Financial Resource Strain:   . Difficulty of Paying Living Expenses:   Food Insecurity:   . Worried About Charity fundraiser in the Last Year:   . Arboriculturist in the Last Year:   Transportation Needs:   . Lexicographer (Medical):   Marland Kitchen Lack of Transportation (Non-Medical):   Physical Activity:   . Days of Exercise per Week:   . Minutes of Exercise per Session:   Stress:   . Feeling of Stress :   Social Connections:   . Frequency of Communication with Friends and Family:   . Frequency of Social Gatherings with Friends and Family:   . Attends Religious Services:   . Active Member of Clubs or Organizations:   . Attends Archivist Meetings:   Marland Kitchen Marital Status:   Intimate Partner Violence:   . Fear of Current or Ex-Partner:   . Emotionally Abused:   .  Physically Abused:   . Sexually Abused:     Family History  Problem Relation Age of Onset  . CAD Father        MI and CABG  . Diabetes Father   . Heart disease Father   . Colon cancer Mother 29    ROS: no fevers or chills, productive cough, hemoptysis, dysphasia, odynophagia, melena, hematochezia, dysuria, hematuria, rash, seizure activity, orthopnea, PND, pedal edema, claudication. Remaining systems are negative.  Physical Exam: Well-developed well-nourished in no acute distress.  Skin is warm and dry.  HEENT is normal.  Neck is supple.  Chest is clear to auscultation with normal expansion.  Cardiovascular exam is irregular Abdominal exam nontender or distended. No masses palpated. Extremities show no edema. neuro grossly intact  ECG-atrial fibrillation at a rate of 94, normal axis, no ST changes.  Personally reviewed  A/P  1 persistent atrial fibrillation-patient remains in atrial fibrillation.  I think this was likely caused by the stress of her pneumonia.  She has now been treated and improved. She has been compliant with Xarelto and has not missed any doses.  We will arrange cardioversion.  Continue Cardizem but decrease metoprolol to 25 mg twice daily.  Hold metoprolol the night before and the morning of her procedure.  If she does not maintain sinus rhythm may need to consider antiarrhythmic.  2  hypertension-blood pressure controlled.  Continue present medical regimen.  3 hyperlipidemia-continue statin.  Note she did not tolerate higher doses in the past.  4 history of stress-induced cardiomyopathy-LV function has normalized and is normal on most recent study.  5 carotid artery disease/peripheral vascular disease-plan follow-up carotid Dopplers November 2021.  Continue statin.  She is not on aspirin given need for anticoagulation.  6 tobacco abuse-patient recently discontinued.  Kirk Ruths, MD

## 2020-05-15 NOTE — Progress Notes (Signed)
Pharmacy Antibiotic Note  Kathleen Griffith is a 79 y.o. female admitted on 05/06/2020 with refractory PNA.  Pharmacy has been consulted for Cefepime dosing.  ID: Refractory CAP.  PC WNL 5/11 cxr shows worsening pna - WBC WNL, Afebrile. SCr 1.19 stable.  CrCl ~ 36 ml/min. Cefepime continues.  Azithromycin discontinue 5/16, completed 9 days of azithromycin.   Cefepime 5/12>> Azith 5/8>>5/16 CTX 5/8>>5/12  5/8: BC x 2 neg 5/11: Sputum: Normal flora   Plan: Continue Cefepime 2g IV q12h (for CrCl 30-60)   Height: 5\' 3"  (160 cm) Weight: 68.2 kg (150 lb 5.7 oz) IBW/kg (Calculated) : 52.4  Temp (24hrs), Avg:98.1 F (36.7 C), Min:97.7 F (36.5 C), Max:98.8 F (37.1 C)  Recent Labs  Lab 05/11/20 0330 05/12/20 0646 05/13/20 0237 05/14/20 0604 05/15/20 0158  WBC 9.9 9.8 12.1* 8.8 9.5  CREATININE 1.33* 1.21* 1.19* 1.13* 1.19*    Estimated Creatinine Clearance: 35.5 mL/min (A) (by C-G formula based on SCr of 1.19 mg/dL (H)).    Allergies  Allergen Reactions  . Anoro Ellipta [Umeclidinium-Vilanterol] Palpitations  . Losartan Shortness Of Breath    wheezing wheezing  . Norvasc [Amlodipine Besylate] Swelling  . Pregabalin Swelling    TO LOWER EXTREMTIES TO LOWER EXTREMTIES  . Bupropion     Other reaction(s): Other Sucidal thoughts  . Eliquis [Apixaban] Nausea And Vomiting    Severe stomach pain  . Metoclopramide Other (See Comments)    Tardive dyskinesia, tremors  . Lisinopril Cough  . Metformin Diarrhea  . Metformin And Related Diarrhea   Nicole Cella, RPh Clinical Pharmacist [phone] Please check AMION for all [place] phone numbers After 10:00 PM, call [place] [phone]  05/15/2020 1:34 PM

## 2020-05-15 NOTE — Progress Notes (Signed)
Initial Nutrition Assessment  RD working remotely.  DOCUMENTATION CODES:   Not applicable  INTERVENTION:   -MVI with minerals daily -Glucerna Shake po TID, each supplement provides 220 kcal and 10 grams of protein -Magic cup TID with meals, each supplement provides 290 kcal and 9 grams of protein  NUTRITION DIAGNOSIS:   Inadequate oral intake related to decreased appetite as evidenced by meal completion < 50%.  GOAL:   Patient will meet greater than or equal to 90% of their needs  MONITOR:   PO intake, Supplement acceptance, Labs, Weight trends, Skin, I & O's  REASON FOR ASSESSMENT:   Malnutrition Screening Tool    ASSESSMENT:   Kathleen Griffith is a 79 y.o. female with medical history significant of PVD: PMR; HTN; HLD; DM with gastroparesis; fibromyalgia; and afib presenting with refractory PNA.  Pt admitted with CAP.   Reviewed I/O's: -1.4 L x 24 hours and -6.1 L since admission  UOP: 1.4 L x 24 hours  Attempted to speak with pt via phone, however, phone call terminated prior to speaking with pt.   Per chart review, pt complains of feeling weak and needing SNF at discharge. Therapies have evaluated pt and recommending home health at discharge.   Pt with variable intake; noted meal completion 10-100%, averaging around 50% of meals.   Reviewed wt hx; pt has experienced a 4.9% wt loss over the past 6 months, which is not significant for time frame.   Lab Results  Component Value Date   HGBA1C 6.8 (H) 05/06/2020   PTA DM medications are 28 units insulin glargine daily. Per DM coordinator note, pt was not taking insulin for 1 week PTA due to decreased oral intake. DM coordinator recommending d/c insulin at d/c.   Labs reviewed: CBGS: 118-141 (inpatient orders for glycemic control are 0-15 units insulin aspart TID with meals and 0-5 units insulin aspart q HS).   Diet Order:   Diet Order            Diet - low sodium heart healthy        Diet Carb Modified Fluid  consistency: Thin; Room service appropriate? Yes  Diet effective now              EDUCATION NEEDS:   No education needs have been identified at this time  Skin:  Skin Assessment: Reviewed RN Assessment  Last BM:  05/13/20  Height:   Ht Readings from Last 1 Encounters:  05/06/20 5\' 3"  (1.6 m)    Weight:   Wt Readings from Last 1 Encounters:  05/15/20 68.2 kg    Ideal Body Weight:  52.3 kg  BMI:  Body mass index is 26.63 kg/m.  Estimated Nutritional Needs:   Kcal:  1700-1900  Protein:  90-105 grams  Fluid:  > 1.7 L    Loistine Chance, RD, LDN, [place] Registered Dietitian II Certified Diabetes Care and Education Specialist Please refer to [facility] for RD and/or RD on-call/weekend/after hours pager

## 2020-05-15 NOTE — Progress Notes (Addendum)
Progress Note  Patient Name: Kathleen Griffith Date of Encounter: 05/15/2020  Primary Cardiologist: Kirk Ruths, MD   Subjective   No CP SOB is improved, but not back to where she was before she started getting sick Feels she needs rehab, does not feel strong enough to go home alone (son helps)  Inpatient Medications    Scheduled Meds: . budesonide (PULMICORT) nebulizer solution  0.25 mg Nebulization BID  . diltiazem  120 mg Oral Daily  . docusate sodium  100 mg Oral BID  . gabapentin  100 mg Oral QHS  . insulin aspart  0-15 Units Subcutaneous TID WC  . insulin aspart  0-5 Units Subcutaneous QHS  . levalbuterol  0.63 mg Nebulization BID  . metoprolol tartrate  25 mg Oral BID  . nicotine  14 mg Transdermal Daily  . pantoprazole  40 mg Oral BID  . pravastatin  20 mg Oral QHS  . rivaroxaban  15 mg Oral QHS  . sertraline  50 mg Oral Daily  . sucralfate  1 g Oral TID WC & HS   Continuous Infusions: . ceFEPime (MAXIPIME) IV 2 g (05/15/20 0904)   PRN Meds: acetaminophen **OR** acetaminophen, albuterol, bisacodyl, HYDROcodone-acetaminophen, morphine injection, ondansetron **OR** ondansetron (ZOFRAN) IV, polyethylene glycol, zolpidem   Vital Signs    Vitals:   05/14/20 2132 05/15/20 0557 05/15/20 0626 05/15/20 0833  BP: 127/87 112/87    Pulse: 76 93    Resp: 18 18    Temp: 98.8 F (37.1 C) 97.7 F (36.5 C)    TempSrc: Oral Oral    SpO2: 96% 96%  97%  Weight:   68.2 kg   Height:        Intake/Output Summary (Last 24 hours) at 05/15/2020 1003 Last data filed at 05/15/2020 0617 Gross per 24 hour  Intake --  Output 1400 ml  Net -1400 ml   Filed Weights   05/13/20 0616 05/14/20 0500 05/15/20 0626  Weight: 70.1 kg 70.2 kg 68.2 kg    Physical Exam   GEN: No acute distress.   Neck: JVD 9 cm Cardiac: Irreg R&R, no murmurs, rubs, or gallops.  Respiratory: diminished to auscultation bilaterally with rales in the bases. GI: Soft, nontender, non-distended  MS:  No edema; No deformity. Neuro:  Nonfocal  Psych: Normal affect   Labs    Chemistry Recent Labs  Lab 05/13/20 0237 05/14/20 0604 05/15/20 0158  NA 137 138 137  K 4.2 4.3 4.2  CL 101 100 101  CO2 30 30 29   GLUCOSE 138* 167* 143*  BUN 13 14 15   CREATININE 1.19* 1.13* 1.19*  CALCIUM 8.2* 8.6* 8.8*  GFRNONAA 43* 46* 43*  GFRAA 50* 54* 50*  ANIONGAP 6 8 7      Hematology Recent Labs  Lab 05/13/20 0237 05/14/20 0604 05/15/20 0158  WBC 12.1* 8.8 9.5  RBC 3.78* 4.12 4.13  HGB 10.7* 11.8* 11.7*  HCT 34.7* 38.1 37.8  MCV 91.8 92.5 91.5  MCH 28.3 28.6 28.3  MCHC 30.8 31.0 31.0  RDW 14.6 14.6 14.5  PLT 369 352 329    Cardiac EnzymesNo results for input(s): TROPONINI in the last 168 hours. No results for input(s): TROPIPOC in the last 168 hours.   BNP Recent Labs  Lab 05/09/20 1031  BNP 1,775.3*     DDimer No results for input(s): DDIMER in the last 168 hours.   Radiology    DG Chest 2 View  Result Date: 05/09/2020 CLINICAL DATA:  Recent pneumonia,  persistent shortness of breath and weakness EXAM: CHEST - 2 VIEW COMPARISON:  05/06/2020 FINDINGS: Normal heart size and mediastinal contours. Atherosclerotic calcification aorta. Extensive BILATERAL pulmonary infiltrates consistent with multifocal pneumonia, greatest in RIGHT upper lobe. Infiltrates are progressive since prior study. No pleural effusion or pneumothorax. Bones demineralized. IMPRESSION: Progressive BILATERAL pulmonary infiltrates consistent with multifocal pneumonia, greatest in RIGHT upper lobe. Electronically Signed   By: Lavonia Dana M.D.   On: 05/09/2020 13:52   CT ANGIO CHEST PE W OR WO CONTRAST  Result Date: 05/06/2020 CLINICAL DATA:  Shortness of breath. Recent pneumonia diagnosis. EXAM: CT ANGIOGRAPHY CHEST WITH CONTRAST TECHNIQUE: Multidetector CT imaging of the chest was performed using the standard protocol during bolus administration of intravenous contrast. Multiplanar CT image reconstructions and  MIPs were obtained to evaluate the vascular anatomy. CONTRAST:  69mL OMNIPAQUE IOHEXOL 350 MG/ML SOLN COMPARISON:  03/07/2016 FINDINGS: Cardiovascular: No pericardial effusion. Mild biatrial enlargement. The RV is nondilated. Borderline enlargement of central pulmonary arteries. Satisfactory opacification of pulmonary arteries noted, and there is no evidence of pulmonary emboli. Mitral annulus calcifications. Moderate coronary calcifications. Adequate contrast opacification of the thoracic aorta with no evidence of dissection, aneurysm, or stenosis. There is classic 3-vessel brachiocephalic arch anatomy without proximal stenosis. Moderate scattered calcified plaque through the thoracic aorta. Visualized proximal abdominal aorta is atheromatous, nondilated. Mediastinum/Nodes: Partially calcified 1.7 cm subcarinal node. Enlarged right paratracheal nodes up to 1.4 cm. Subcentimeter prevascular and AP window lymph nodes. Partially calcified subcentimeter left hilar lymph nodes Lungs/Pleura: Trace right pleural effusion. No pneumothorax. Extensive airspace opacities throughout superior and posterior segments right upper lobe. Scattered somewhat geographic ground-glass opacities in both lower lobes. Calcified granuloma in the left lower lobe. Secretions partially obstructing right lower lobe segmental bronchi. Upper Abdomen: Large right renal cyst, incompletely visualized. Partially calcified subcentimeter stones in the dependent aspect of the nondilated gallbladder fundus. No acute findings. Musculoskeletal: Anterior vertebral endplate spurring at multiple levels in the mid and lower thoracic spine. Review of the MIP images confirms the above findings. IMPRESSION: 1. Negative for acute PE or thoracic aortic dissection. 2. Extensive right upper lobe airspace opacities suggesting pneumonia. Followup PA and lateral chest X-ray is recommended in 3-4 weeks following trial of antibiotic therapy to ensure resolution and exclude  underlying malignancy. 3. Trace right pleural effusion. 4. Cholelithiasis. Aortic Atherosclerosis (ICD10-I70.0). Electronically Signed   By: Lucrezia Europe M.D.   On: 05/06/2020 14:28   DG Chest Port 1 View  Result Date: 05/13/2020 CLINICAL DATA:  Follow-up pneumonia EXAM: PORTABLE CHEST 1 VIEW COMPARISON:  05/09/2020 FINDINGS: Cardiomegaly and aortic atherosclerosis as seen previously. Widespread pulmonary infiltrates persist but show some radiographic improvement. More dense consolidation in the right upper lobe seen previously is again noted but is also improving. No worsening or new finding. IMPRESSION: Improving widespread pulmonary infiltrates, most pronounced in the right upper lobe. No worsening or new finding. Electronically Signed   By: Nelson Chimes M.D.   On: 05/13/2020 08:44   DG Chest Port 1 View  Result Date: 05/06/2020 CLINICAL DATA:  Pneumonia EXAM: PORTABLE CHEST 1 VIEW COMPARISON:  September 25, 2018 FINDINGS: There is airspace consolidation in the right upper lobe. There is a calcified granuloma in the left lower lung region. Lungs otherwise are clear. Heart is upper normal in size with pulmonary vascularity normal. No adenopathy. There is aortic atherosclerosis. No bone lesions. IMPRESSION: Airspace consolidation right upper lobe consistent with pneumonia. Lungs otherwise clear except for small left lower lobe region calcified granuloma. Heart  upper normal in size. No demonstrable adenopathy by radiography. Aortic Atherosclerosis (ICD10-I70.0). Electronically Signed   By: Lowella Grip III M.D.   On: 05/06/2020 10:49   ECHOCARDIOGRAM COMPLETE  Result Date: 05/06/2020    ECHOCARDIOGRAM REPORT   Patient Name:   YAMILKA Sanker Date of Exam: 05/06/2020 Medical Rec #:  [id number]       Height:       63.0 in Accession #:    [id number]      Weight:       157.2 lb Date of Birth:  Jun 16, 1941       BSA:          1.745 m Patient Age:    53 years        BP:           151/76 mmHg Patient Gender: F                HR:           97 bpm. Exam Location:  Inpatient Procedure: 2D Echo Indications:    dyspnea 786.09  History:        Patient has prior history of Echocardiogram examinations, most                 recent 08/12/2017. Arrythmias:Atrial Fibrillation; Risk                 Factors:Hypertension, Dyslipidemia and Current Smoker.  Sonographer:    Johny Chess Referring Phys: Electra  1. Left ventricular ejection fraction, by estimation, is 50 to 55%. The left ventricle has low normal function. The left ventricle has no regional wall motion abnormalities. Left ventricular diastolic function could not be evaluated.  2. Right ventricular systolic function is normal. The right ventricular size is normal. There is normal pulmonary artery systolic pressure.  3. The mitral valve is normal in structure. No evidence of mitral valve regurgitation. No evidence of mitral stenosis.  4. The aortic valve is normal in structure. Aortic valve regurgitation is not visualized. No aortic stenosis is present. FINDINGS  Left Ventricle: Left ventricular ejection fraction, by estimation, is 50 to 55%. The left ventricle has low normal function. The left ventricle has no regional wall motion abnormalities. The left ventricular internal cavity size was normal in size. There is no left ventricular hypertrophy. Left ventricular diastolic function could not be evaluated due to atrial fibrillation. Left ventricular diastolic function could not be evaluated. Right Ventricle: The right ventricular size is normal. No increase in right ventricular wall thickness. Right ventricular systolic function is normal. There is normal pulmonary artery systolic pressure. The tricuspid regurgitant velocity is 2.86 m/s, and  with an assumed right atrial pressure of 3 mmHg, the estimated right ventricular systolic pressure is 78.2 mmHg. Left Atrium: Left atrial size was normal in size. Right Atrium: Right atrial size was normal in size.  Pericardium: There is no evidence of pericardial effusion. Mitral Valve: The mitral valve is normal in structure. No evidence of mitral valve regurgitation. No evidence of mitral valve stenosis. Tricuspid Valve: The tricuspid valve is grossly normal. Tricuspid valve regurgitation is mild . No evidence of tricuspid stenosis. Aortic Valve: The aortic valve is normal in structure. Aortic valve regurgitation is not visualized. No aortic stenosis is present. Pulmonic Valve: The pulmonic valve was normal in structure. Pulmonic valve regurgitation is not visualized. No evidence of pulmonic stenosis. Aorta: The aortic root and ascending aorta are structurally normal, with no evidence of dilitation. IAS/Shunts: The  atrial septum is grossly normal.  LEFT VENTRICLE PLAX 2D LVIDd:         3.80 cm LVIDs:         2.80 cm LV PW:         1.00 cm LV IVS:        1.00 cm LVOT diam:     2.00 cm LVOT Area:     3.14 cm  RIGHT VENTRICLE             IVC RV S prime:     13.70 cm/s  IVC diam: 1.80 cm LEFT ATRIUM             Index       RIGHT ATRIUM           Index LA diam:        3.50 cm 2.01 cm/m  RA Area:     14.80 cm LA Vol (A2C):   42.1 ml 24.12 ml/m RA Volume:   34.30 ml  19.65 ml/m LA Vol (A4C):   40.3 ml 23.09 ml/m LA Biplane Vol: 44.0 ml 25.21 ml/m   AORTA Ao Root diam: 2.90 cm TRICUSPID VALVE TR Peak grad:   32.7 mmHg TR Vmax:        286.00 cm/s  SHUNTS Systemic Diam: 2.00 cm Mertie Moores MD Electronically signed by Mertie Moores MD Signature Date/Time: 05/06/2020/5:20:02 PM    Final    Telemetry    HR generally controlled, some approx 2 sec pauses, none > 2.5 sec  ECG    No new tracing as of 05/15/2020 - Personally Reviewed  Cardiac Studies   Echocardiogram 05/06/20: 1. Left ventricular ejection fraction, by estimation, is 50 to 55%. The  left ventricle has low normal function. The left ventricle has no regional  wall motion abnormalities. Left ventricular diastolic function could not  be evaluated.  2. Right  ventricular systolic function is normal. The right ventricular  size is normal. There is normal pulmonary artery systolic pressure.  3. The mitral valve is normal in structure. No evidence of mitral valve  regurgitation. No evidence of mitral stenosis.  4. The aortic valve is normal in structure. Aortic valve regurgitation is  not visualized. No aortic stenosis is present.   Patient Profile     79 y.o. female with a hx PAF, COPD, PVD, PMR, hypertension, hyperlipidemia, DM, fibromyalgiaof who is being seen today for the evaluation of persistent atrial fibrillation. Admitted 05/08 with pneumonia; echo shows normal LV function.  Assessment & Plan    1. Persistent atrial fibrillation: - likely precipitated by PNA - rate generally controlled - on Xarelto - BB dose up and down, now on home dose Lopressor 25 mg bid - HR goal < 100 bpm  2. PNA: - ABX per IM  3. HTN: - BP 112-158/74-100  - continue to follow  4. Tobacco use: - quit x 3 weeks, needs continued cessation  5. Bradycardia - no HR sustained < 50 - no sx - follow  Rosaria Ferries, PA-C 05/15/2020 10:12 AM  See separate note from same day .    Mertie Moores, MD  05/15/2020 10:25 AM    [address],  [place],   [id number] Phone: [phone]; Fax: [phone]

## 2020-05-15 NOTE — Progress Notes (Deleted)
PROGRESS NOTE  Kathleen Griffith ASN:[id number] DOB: [birth year] DOA: 05/06/2020 PCP: No primary care provider on file.  HPI/Recap of past 24 hours: HPI from Dr Lorrin Mais is an 79 y.o. female with medical history significant ofPVD: PMR; HTN; HLD; DM with gastroparesis; fibromyalgia; and afib presenting with refractory PNA.She reports that she was doing fine. She flew to [place] to visit a friend. The day after returning home, she was in afib (hasn't been in that in over a year). She fell the night before she came home for the first time in over a year. Things just went from bad to worse. She fell at home 5 days later. She has no energy, takes everything she can do to do anything. She gets up on the side of the chair or bed and has to sit there for an hour or more to get the energy to get up. She can't fix meals. She drinks water. Yesterday, she ate one piece of toast all day. Not really SOB, only with conversation or moving around. +cough, productive of greenish sputum. No fever. She saw Dr. Coletta Memos on 5/5 and he sent her for CXR and ankle xray (from fall); he told her he thought she had atypical PNA and called in azithromycin.     Today, patient reports feeling very weak, denies any chest pain, worsening shortness of breath, abdominal pain, nausea/vomiting, fever/chills.  Noted patient still in RVR, heart rate went up to 137 upon ambulation with physical therapy.  Patient might need SNF.  Noted involuntary lower extremity jerking while examining patient in bed today.    Assessment/Plan: Principal Problem:   CAP (community acquired pneumonia) Active Problems:   Tobacco abuse   Essential hypertension   Hyperlipidemia   Atrial fibrillation with RVR (HCC)   DNR (do not resuscitate)   Palliative care encounter   Multifocal CAP Currently afebrile, with resolved leukocytosis Currently saturating above 90% on room air Procalcitonin 0.95--> 0.12,  downtrending Influenza, COVID-19 negative BC x2 NGTD Sputum culture consistent with normal respiratory flora CTA chest showed extensive right upper lobe airspace opacity Chest x-ray showed initially right upper lobe pneumonia, repeat currently shows improved multifocal pneumonia Continue cefepime for total of 7 days, completed 9 days of azithromycin Duo nebs, Pulmicort, supplemental O2 as needed Continuous pulse ox, telemetry  Persistent A. Fib Echo showed EF of 50 to 55%, no regional wall motion abnormality noted Cardiology on board, increased metoprolol to 25 mg twice daily, continue Cardizem, plan for possible cardioversion once pneumonia is treated as an outpt Continue Xarelto Telemetry to monitor closely for pauses while on CCB/BB  ?? Fluid overload/acute on chronic systolic HF Improved BNP elevated 1,775 Echo showed EF of 50 to 55%, no regional wall motion abnormality Cardiology on board, s/p IV diuresis, stopped due to rising Cr Daily BMP  Involuntary lower extremity jerking movements Unknown etiology Not uremic, electrolytes WNL except for magnesium pending EEG ordered  Diabetes mellitus type 2 A1c 6.8 SSI, Accu-Cheks, hypoglycemic protocol  Hypertension Continue Lopressor, Cardizem  Hyperlipidemia Continue statins  Tobacco abuse Advised to quit Nicotine patch  [place] Patient is currently DNR, palliative consulted for further goals of care discussion         Malnutrition Type:  Nutrition Problem: Inadequate oral intake Etiology: decreased appetite   Malnutrition Characteristics:  Signs/Symptoms: meal completion < 50%   Nutrition Interventions:  Interventions: Glucerna shake, MVI, Magic cup    Estimated body mass index is 26.63 kg/m as calculated from the following:  Height as of this encounter: 5\' 3"  (1.6 m).   Weight as of this encounter: 68.2 kg.     Code Status: DNR  Family Communication: Discussed extensively with patient, plan to  discuss with family if needed  Disposition Plan: Status is: Inpatient  Remains inpatient appropriate because:IV treatments appropriate due to intensity of illness or inability to take PO   Dispo: The patient is from: Home              Anticipated d/c is to: SNF              Anticipated d/c date is: 1 day on 05/16/20              Patient currently is not medically stable to d/c.,  Requiring IV antibiotics, cardiology signing off, needs EEG   Consultants:  Cardiology  Procedures:  None  Antimicrobials:  Cefepime  DVT prophylaxis: Xarelto   Objective: Vitals:   05/15/20 0557 05/15/20 0626 05/15/20 0833 05/15/20 1233  BP: 112/87   131/83  Pulse: 93   60  Resp: 18   16  Temp: 97.7 F (36.5 C)   98 F (36.7 C)  TempSrc: Oral   Oral  SpO2: 96%  97% 93%  Weight:  68.2 kg    Height:        Intake/Output Summary (Last 24 hours) at 05/15/2020 1244 Last data filed at 05/15/2020 0800 Gross per 24 hour  Intake 360 ml  Output 1400 ml  Net -1040 ml   Filed Weights   05/13/20 0616 05/14/20 0500 05/15/20 0626  Weight: 70.1 kg 70.2 kg 68.2 kg    Exam:  General: NAD, lethargic, noted lower extremity involuntary jerking movements  Cardiovascular: S1, S2 present  Respiratory:  Diminished breath sounds bilaterally  Abdomen: Soft, nontender, nondistended, bowel sounds present  Musculoskeletal: No bilateral pedal edema noted  Skin: Normal  Psychiatry: Normal mood   Data Reviewed: CBC: Recent Labs  Lab 05/11/20 0330 05/12/20 0646 05/13/20 0237 05/14/20 0604 05/15/20 0158  WBC 9.9 9.8 12.1* 8.8 9.5  NEUTROABS 7.0 7.1 8.9* 6.2 6.1  HGB 11.5* 11.8* 10.7* 11.8* 11.7*  HCT 35.9* 38.4 34.7* 38.1 37.8  MCV 90.2 92.3 91.8 92.5 91.5  PLT 340 369 369 352 921   Basic Metabolic Panel: Recent Labs  Lab 05/11/20 0330 05/12/20 0646 05/13/20 0237 05/14/20 0604 05/15/20 0158  NA 135 138 137 138 137  K 3.8 3.7 4.2 4.3 4.2  CL 99 98 101 100 101  CO2 25 31 30 30 29    GLUCOSE 131* 145* 138* 167* 143*  BUN 19 13 13 14 15   CREATININE 1.33* 1.21* 1.19* 1.13* 1.19*  CALCIUM 8.2* 8.4* 8.2* 8.6* 8.8*   GFR: Estimated Creatinine Clearance: 35.5 mL/min (A) (by C-G formula based on SCr of 1.19 mg/dL (H)). Liver Function Tests: No results for input(s): AST, ALT, ALKPHOS, BILITOT, PROT, ALBUMIN in the last 168 hours. No results for input(s): LIPASE, AMYLASE in the last 168 hours. No results for input(s): AMMONIA in the last 168 hours. Coagulation Profile: No results for input(s): INR, PROTIME in the last 168 hours. Cardiac Enzymes: No results for input(s): CKTOTAL, CKMB, CKMBINDEX, TROPONINI in the last 168 hours. BNP (last 3 results) No results for input(s): PROBNP in the last 8760 hours. HbA1C: No results for input(s): HGBA1C in the last 72 hours. CBG: Recent Labs  Lab 05/14/20 1030 05/14/20 1610 05/14/20 2135 05/15/20 0625 05/15/20 1108  GLUCAP 190* 141* 118* 141* 197*   Lipid  Profile: No results for input(s): CHOL, HDL, LDLCALC, TRIG, CHOLHDL, LDLDIRECT in the last 72 hours. Thyroid Function Tests: No results for input(s): TSH, T4TOTAL, FREET4, T3FREE, THYROIDAB in the last 72 hours. Anemia Panel: No results for input(s): VITAMINB12, FOLATE, FERRITIN, TIBC, IRON, RETICCTPCT in the last 72 hours. Urine analysis:    Component Value Date/Time   COLORURINE STRAW (A) 12/27/2019 2333   APPEARANCEUR CLEAR 12/27/2019 2333   LABSPEC 1.006 12/27/2019 2333   PHURINE 8.0 12/27/2019 2333   GLUCOSEU NEGATIVE 12/27/2019 2333   HGBUR NEGATIVE 12/27/2019 2333   [place] NEGATIVE 12/27/2019 [place] 12/27/2019 2333   PROTEINUR 30 (A) 12/27/2019 2333   NITRITE NEGATIVE 12/27/2019 2333   LEUKOCYTESUR TRACE (A) 12/27/2019 2333   Sepsis Labs: @LABRCNTIP (procalcitonin:4,lacticidven:4)  ) Recent Results (from the past 240 hour(s))  Culture, blood (x 2)     Status: None   Collection Time: 05/06/20  9:05 AM   Specimen: BLOOD  Result  Value Ref Range Status   Specimen Description BLOOD RIGHT ANTECUBITAL  Final   Special Requests   Final    BOTTLES DRAWN AEROBIC AND ANAEROBIC Blood Culture adequate volume   Culture   Final    NO GROWTH 5 DAYS Performed at [facility], [place] [address], [place], [place] [address]    Report Status 05/11/2020 FINAL  Final  Culture, blood (x 2)     Status: None   Collection Time: 05/06/20  9:10 AM   Specimen: BLOOD RIGHT WRIST  Result Value Ref Range Status   Specimen Description BLOOD RIGHT WRIST  Final   Special Requests   Final    BOTTLES DRAWN AEROBIC AND ANAEROBIC Blood Culture adequate volume   Culture   Final    NO GROWTH 5 DAYS Performed at [facility], [place] [address], [place], [place] [address]    Report Status 05/11/2020 FINAL  Final  Respiratory Panel by RT PCR (Flu A&B, Covid) - Nasopharyngeal Swab     Status: None   Collection Time: 05/06/20  9:28 AM   Specimen: Nasopharyngeal Swab  Result Value Ref Range Status   SARS Coronavirus 2 by RT PCR NEGATIVE NEGATIVE Final    Comment: (NOTE) SARS-CoV-2 target nucleic acids are NOT DETECTED. The SARS-CoV-2 RNA is generally detectable in upper respiratoy specimens during the acute phase of infection. The lowest concentration of SARS-CoV-2 viral copies this assay can detect is 131 copies/mL. A negative result does not preclude SARS-Cov-2 infection and should not be used as the sole basis for treatment or other patient management decisions. A negative result may occur with  improper specimen collection/handling, submission of specimen other than nasopharyngeal swab, presence of viral mutation(s) within the areas targeted by this assay, and inadequate number of viral copies (<131 copies/mL). A negative result must be combined with clinical observations, patient history, and epidemiological information. The expected result is Negative. Fact Sheet for Patients:  PinkCheek.be Fact Sheet  for Healthcare Providers:  GravelBags.it This test is not yet ap proved or cleared by the [place] FDA and  has been authorized for detection and/or diagnosis of SARS-CoV-2 by FDA under an Emergency Use Authorization (EUA). This EUA will remain  in effect (meaning this test can be used) for the duration of the COVID-19 declaration under Section 564(b)(1) of the Act, 21 U.S.C. section 360bbb-3(b)(1), unless the authorization is terminated or revoked sooner.    Influenza A by PCR NEGATIVE NEGATIVE Final   Influenza B by PCR NEGATIVE NEGATIVE  Final    Comment: (NOTE) The Xpert Xpress SARS-CoV-2/FLU/RSV assay is intended as an aid in  the diagnosis of influenza from Nasopharyngeal swab specimens and  should not be used as a sole basis for treatment. Nasal washings and  aspirates are unacceptable for Xpert Xpress SARS-CoV-2/FLU/RSV  testing. Fact Sheet for Patients: PinkCheek.be Fact Sheet for Healthcare Providers: GravelBags.it This test is not yet approved or cleared by the [place] FDA and  has been authorized for detection and/or diagnosis of SARS-CoV-2 by  FDA under an Emergency Use Authorization (EUA). This EUA will remain  in effect (meaning this test can be used) for the duration of the  Covid-19 declaration under Section 564(b)(1) of the Act, 21  U.S.C. section 360bbb-3(b)(1), unless the authorization is  terminated or revoked. Performed at [facility], [place] [address], [place], [place] [address]   Expectorated sputum assessment w rflx to resp cult     Status: None   Collection Time: 05/09/20 12:48 PM   Specimen: Expectorated Sputum  Result Value Ref Range Status   Specimen Description Expect. Sput  Final   Special Requests NONE  Final   Sputum evaluation   Final    THIS SPECIMEN IS ACCEPTABLE FOR SPUTUM CULTURE Performed at [facility], [address],  [place], [place] [address]    Report Status 05/09/2020 FINAL  Final  Culture, respiratory     Status: None   Collection Time: 05/09/20 12:48 PM  Result Value Ref Range Status   Specimen Description Expect. Sput  Final   Special Requests NONE Reflexed from [id number]  Final   Gram Stain   Final    RARE WBC PRESENT, PREDOMINANTLY PMN FEW SQUAMOUS EPITHELIAL CELLS PRESENT MODERATE GRAM NEGATIVE RODS FEW GRAM POSITIVE COCCI FEW GRAM POSITIVE RODS    Culture   Final    Consistent with normal respiratory flora. Performed at [facility], [place] [address] [place], [place] [address]    Report Status 05/11/2020 FINAL  Final      Studies: No results found.  Scheduled Meds: . budesonide (PULMICORT) nebulizer solution  0.25 mg Nebulization BID  . diltiazem  120 mg Oral Daily  . docusate sodium  100 mg Oral BID  . feeding supplement (GLUCERNA SHAKE)  237 mL Oral TID BM  . gabapentin  100 mg Oral QHS  . insulin aspart  0-15 Units Subcutaneous TID WC  . insulin aspart  0-5 Units Subcutaneous QHS  . levalbuterol  0.63 mg Nebulization BID  . metoprolol tartrate  25 mg Oral BID  . multivitamin with minerals  1 tablet Oral Daily  . nicotine  14 mg Transdermal Daily  . pantoprazole  40 mg Oral BID  . pravastatin  20 mg Oral QHS  . rivaroxaban  15 mg Oral QHS  . sertraline  50 mg Oral Daily  . sucralfate  1 g Oral TID WC & HS    Continuous Infusions: . ceFEPime (MAXIPIME) IV 2 g (05/15/20 0904)     LOS: 9 days     Alma Friendly, MD [facility]  If 7PM-7AM, please contact night-coverage [email or web address] 05/15/2020, 12:44 PM

## 2020-05-15 NOTE — Progress Notes (Signed)
PROGRESS NOTE  Kathleen Griffith OFB:[id number] DOB: 14-May-1941 DOA: 05/06/2020 PCP: No primary care provider on file.  HPI/Recap of past 24 hours: HPI from Dr Lorrin Mais is an 79 y.o. female with medical history significant ofPVD: PMR; HTN; HLD; DM with gastroparesis; fibromyalgia; and afib presenting with refractory PNA.She reports that she was doing fine. She flew to [place] to visit a friend. The day after returning home, she was in afib (hasn't been in that in over a year). She fell the night before she came home for the first time in over a year. Things just went from bad to worse. She fell at home 5 days later. She has no energy, takes everything she can do to do anything. She gets up on the side of the chair or bed and has to sit there for an hour or more to get the energy to get up. She can't fix meals. She drinks water. Yesterday, she ate one piece of toast all day. Not really SOB, only with conversation or moving around. +cough, productive of greenish sputum. No fever. She saw Dr. Coletta Memos on 5/5 and he sent her for CXR and ankle xray (from fall); he told her he thought she had atypical PNA and called in azithromycin.     Today, patient reports feeling very weak, denies any chest pain, worsening shortness of breath, abdominal pain, nausea/vomiting, fever/chills.  Noted patient still in RVR, heart rate went up to 137 upon ambulation with physical therapy.  Patient might need SNF.  Noted involuntary lower extremity jerking while examining patient in bed today.    Assessment/Plan: Principal Problem:   CAP (community acquired pneumonia) Active Problems:   Tobacco abuse   Essential hypertension   Hyperlipidemia   Atrial fibrillation with RVR (HCC)   DNR (do not resuscitate)   Palliative care encounter   Multifocal CAP Currently afebrile, with resolved leukocytosis Currently saturating above 90% on room air Procalcitonin 0.95--> 0.12,  downtrending Influenza, COVID-19 negative BC x2 NGTD Sputum culture consistent with normal respiratory flora CTA chest showed extensive right upper lobe airspace opacity Chest x-ray showed initially right upper lobe pneumonia, repeat currently shows improved multifocal pneumonia Continue cefepime, completed 9 days of azithromycin Duo nebs, Pulmicort, supplemental O2 as needed Continuous pulse ox, telemetry  Persistent A. Fib/Noted pauses on telemetry Echo showed EF of 50 to 55%, no regional wall motion abnormality noted Cardiology on board, increased metoprolol to 25 mg twice daily, continue Cardizem, plan for possible cardioversion once pneumonia is treated as an outpt Continue Xarelto Telemetry to monitor closely for pauses while on CCB/BB  ?? Fluid overload/acute on chronic systolic HF Improved BNP elevated 1,775 Echo showed EF of 50 to 55%, no regional wall motion abnormality Cardiology on board, s/p IV diuresis, stopped due to rising Cr Daily BMP  Involuntary BLE jerking movement Unknown etiology Not uremic, electrolytes WNL except for magnesium which is pending EEG Monitor closely  Diabetes mellitus type 2 A1c 6.8 SSI, Accu-Cheks, hypoglycemic protocol  Hypertension Continue Lopressor, Cardizem  Hyperlipidemia Continue statins  Tobacco abuse Advised to quit Nicotine patch  [place] Patient is currently DNR, palliative consulted for further goals of care discussion         Malnutrition Type:  Nutrition Problem: Inadequate oral intake Etiology: decreased appetite   Malnutrition Characteristics:  Signs/Symptoms: meal completion < 50%   Nutrition Interventions:  Interventions: Glucerna shake, MVI, Magic cup    Estimated body mass index is 26.63 kg/m as calculated from the following:  Height as of this encounter: 5\' 3"  (1.6 m).   Weight as of this encounter: 68.2 kg.     Code Status: DNR  Family Communication: Discussed extensively with  patient, plan to discuss with family if needed  Disposition Plan: Status is: Inpatient  Remains inpatient appropriate because:IV treatments appropriate due to intensity of illness or inability to take PO   Dispo: The patient is from: Home              Anticipated d/c is to: SNF              Anticipated d/c date is: 1 day on 05/16/20              Patient currently is not medically stable to d/c.,  Requiring IV antibiotics, cardiology signing off, EEG pending   Consultants:  Cardiology  Procedures:  None  Antimicrobials:  Cefepime  DVT prophylaxis: Xarelto   Objective: Vitals:   05/15/20 0557 05/15/20 0626 05/15/20 0833 05/15/20 1233  BP: 112/87   131/83  Pulse: 93   60  Resp: 18   16  Temp: 97.7 F (36.5 C)   98 F (36.7 C)  TempSrc: Oral   Oral  SpO2: 96%  97% 93%  Weight:  68.2 kg    Height:        Intake/Output Summary (Last 24 hours) at 05/15/2020 1244 Last data filed at 05/15/2020 0800 Gross per 24 hour  Intake 360 ml  Output 1400 ml  Net -1040 ml   Filed Weights   05/13/20 0616 05/14/20 0500 05/15/20 0626  Weight: 70.1 kg 70.2 kg 68.2 kg    Exam:  General: NAD, lethargic, involuntary BLE jerking movements noted  Cardiovascular: S1, S2 present  Respiratory:  Diminished breath sounds bilaterally  Abdomen: Soft, nontender, nondistended, bowel sounds present  Musculoskeletal: No bilateral pedal edema noted  Skin: Normal  Psychiatry: Normal mood   Data Reviewed: CBC: Recent Labs  Lab 05/11/20 0330 05/12/20 0646 05/13/20 0237 05/14/20 0604 05/15/20 0158  WBC 9.9 9.8 12.1* 8.8 9.5  NEUTROABS 7.0 7.1 8.9* 6.2 6.1  HGB 11.5* 11.8* 10.7* 11.8* 11.7*  HCT 35.9* 38.4 34.7* 38.1 37.8  MCV 90.2 92.3 91.8 92.5 91.5  PLT 340 369 369 352 528   Basic Metabolic Panel: Recent Labs  Lab 05/11/20 0330 05/12/20 0646 05/13/20 0237 05/14/20 0604 05/15/20 0158  NA 135 138 137 138 137  K 3.8 3.7 4.2 4.3 4.2  CL 99 98 101 100 101  CO2 25 31 30  30 29   GLUCOSE 131* 145* 138* 167* 143*  BUN 19 13 13 14 15   CREATININE 1.33* 1.21* 1.19* 1.13* 1.19*  CALCIUM 8.2* 8.4* 8.2* 8.6* 8.8*   GFR: Estimated Creatinine Clearance: 35.5 mL/min (A) (by C-G formula based on SCr of 1.19 mg/dL (H)). Liver Function Tests: No results for input(s): AST, ALT, ALKPHOS, BILITOT, PROT, ALBUMIN in the last 168 hours. No results for input(s): LIPASE, AMYLASE in the last 168 hours. No results for input(s): AMMONIA in the last 168 hours. Coagulation Profile: No results for input(s): INR, PROTIME in the last 168 hours. Cardiac Enzymes: No results for input(s): CKTOTAL, CKMB, CKMBINDEX, TROPONINI in the last 168 hours. BNP (last 3 results) No results for input(s): PROBNP in the last 8760 hours. HbA1C: No results for input(s): HGBA1C in the last 72 hours. CBG: Recent Labs  Lab 05/14/20 1030 05/14/20 1610 05/14/20 2135 05/15/20 0625 05/15/20 1108  GLUCAP 190* 141* 118* 141* 197*   Lipid Profile:  No results for input(s): CHOL, HDL, LDLCALC, TRIG, CHOLHDL, LDLDIRECT in the last 72 hours. Thyroid Function Tests: No results for input(s): TSH, T4TOTAL, FREET4, T3FREE, THYROIDAB in the last 72 hours. Anemia Panel: No results for input(s): VITAMINB12, FOLATE, FERRITIN, TIBC, IRON, RETICCTPCT in the last 72 hours. Urine analysis:    Component Value Date/Time   COLORURINE STRAW (A) 12/27/2019 2333   APPEARANCEUR CLEAR 12/27/2019 2333   LABSPEC 1.006 12/27/2019 2333   PHURINE 8.0 12/27/2019 2333   GLUCOSEU NEGATIVE 12/27/2019 2333   HGBUR NEGATIVE 12/27/2019 2333   [place] NEGATIVE 12/27/2019 [place] 12/27/2019 2333   PROTEINUR 30 (A) 12/27/2019 2333   NITRITE NEGATIVE 12/27/2019 2333   LEUKOCYTESUR TRACE (A) 12/27/2019 2333   Sepsis Labs: @LABRCNTIP (procalcitonin:4,lacticidven:4)  ) Recent Results (from the past 240 hour(s))  Culture, blood (x 2)     Status: None   Collection Time: 05/06/20  9:05 AM   Specimen: BLOOD   Result Value Ref Range Status   Specimen Description BLOOD RIGHT ANTECUBITAL  Final   Special Requests   Final    BOTTLES DRAWN AEROBIC AND ANAEROBIC Blood Culture adequate volume   Culture   Final    NO GROWTH 5 DAYS Performed at [facility], [place] [address] [place], [place] [address]    Report Status 05/11/2020 FINAL  Final  Culture, blood (x 2)     Status: None   Collection Time: 05/06/20  9:10 AM   Specimen: BLOOD RIGHT WRIST  Result Value Ref Range Status   Specimen Description BLOOD RIGHT WRIST  Final   Special Requests   Final    BOTTLES DRAWN AEROBIC AND ANAEROBIC Blood Culture adequate volume   Culture   Final    NO GROWTH 5 DAYS Performed at [facility], [place] [address] [place], [place] [address]    Report Status 05/11/2020 FINAL  Final  Respiratory Panel by RT PCR (Flu A&B, Covid) - Nasopharyngeal Swab     Status: None   Collection Time: 05/06/20  9:28 AM   Specimen: Nasopharyngeal Swab  Result Value Ref Range Status   SARS Coronavirus 2 by RT PCR NEGATIVE NEGATIVE Final    Comment: (NOTE) SARS-CoV-2 target nucleic acids are NOT DETECTED. The SARS-CoV-2 RNA is generally detectable in upper respiratoy specimens during the acute phase of infection. The lowest concentration of SARS-CoV-2 viral copies this assay can detect is 131 copies/mL. A negative result does not preclude SARS-Cov-2 infection and should not be used as the sole basis for treatment or other patient management decisions. A negative result may occur with  improper specimen collection/handling, submission of specimen other than nasopharyngeal swab, presence of viral mutation(s) within the areas targeted by this assay, and inadequate number of viral copies (<131 copies/mL). A negative result must be combined with clinical observations, patient history, and epidemiological information. The expected result is Negative. Fact Sheet for Patients:   PinkCheek.be Fact Sheet for Healthcare Providers:  GravelBags.it This test is not yet ap proved or cleared by the [place] FDA and  has been authorized for detection and/or diagnosis of SARS-CoV-2 by FDA under an Emergency Use Authorization (EUA). This EUA will remain  in effect (meaning this test can be used) for the duration of the COVID-19 declaration under Section 564(b)(1) of the Act, 21 U.S.C. section 360bbb-3(b)(1), unless the authorization is terminated or revoked sooner.    Influenza A by PCR NEGATIVE NEGATIVE Final   Influenza B by PCR NEGATIVE NEGATIVE Final  Comment: (NOTE) The Xpert Xpress SARS-CoV-2/FLU/RSV assay is intended as an aid in  the diagnosis of influenza from Nasopharyngeal swab specimens and  should not be used as a sole basis for treatment. Nasal washings and  aspirates are unacceptable for Xpert Xpress SARS-CoV-2/FLU/RSV  testing. Fact Sheet for Patients: PinkCheek.be Fact Sheet for Healthcare Providers: GravelBags.it This test is not yet approved or cleared by the [place] FDA and  has been authorized for detection and/or diagnosis of SARS-CoV-2 by  FDA under an Emergency Use Authorization (EUA). This EUA will remain  in effect (meaning this test can be used) for the duration of the  Covid-19 declaration under Section 564(b)(1) of the Act, 21  U.S.C. section 360bbb-3(b)(1), unless the authorization is  terminated or revoked. Performed at [facility], [place] [address] [place], [place] [address]   Expectorated sputum assessment w rflx to resp cult     Status: None   Collection Time: 05/09/20 12:48 PM   Specimen: Expectorated Sputum  Result Value Ref Range Status   Specimen Description Expect. Sput  Final   Special Requests NONE  Final   Sputum evaluation   Final    THIS SPECIMEN IS ACCEPTABLE FOR SPUTUM  CULTURE Performed at [facility], [address] [place], [place] [address]    Report Status 05/09/2020 FINAL  Final  Culture, respiratory     Status: None   Collection Time: 05/09/20 12:48 PM  Result Value Ref Range Status   Specimen Description Expect. Sput  Final   Special Requests NONE Reflexed from [phone]  Final   Gram Stain   Final    RARE WBC PRESENT, PREDOMINANTLY PMN FEW SQUAMOUS EPITHELIAL CELLS PRESENT MODERATE GRAM NEGATIVE RODS FEW GRAM POSITIVE COCCI FEW GRAM POSITIVE RODS    Culture   Final    Consistent with normal respiratory flora. Performed at [facility], [place] [address] [place],  [address]    Report Status 05/11/2020 FINAL  Final      Studies: No results found.  Scheduled Meds: . budesonide (PULMICORT) nebulizer solution  0.25 mg Nebulization BID  . diltiazem  120 mg Oral Daily  . docusate sodium  100 mg Oral BID  . feeding supplement (GLUCERNA SHAKE)  237 mL Oral TID BM  . gabapentin  100 mg Oral QHS  . insulin aspart  0-15 Units Subcutaneous TID WC  . insulin aspart  0-5 Units Subcutaneous QHS  . levalbuterol  0.63 mg Nebulization BID  . metoprolol tartrate  25 mg Oral BID  . multivitamin with minerals  1 tablet Oral Daily  . nicotine  14 mg Transdermal Daily  . pantoprazole  40 mg Oral BID  . pravastatin  20 mg Oral QHS  . rivaroxaban  15 mg Oral QHS  . sertraline  50 mg Oral Daily  . sucralfate  1 g Oral TID WC & HS    Continuous Infusions: . ceFEPime (MAXIPIME) IV 2 g (05/15/20 0904)     LOS: 9 days     Alma Friendly, MD [facility]  If 7PM-7AM, please contact night-coverage [email or web address] 05/15/2020, 12:44 PM

## 2020-05-15 NOTE — Procedures (Signed)
Patient Name: Kathleen Griffith  MRN: [id number]  Epilepsy Attending: Lora Havens  Referring Physician/Provider: Dr Lesia Sago Date:  05/15/2020 Duration: 33.40 mins  Patient history: 79yo F admitted for pneumonia and noted to have bilateral extremities jerking movements. EEG to evaluate for seizure.   Level of alertness: Awake  AEDs during EEG study: Gabapentin  Technical aspects: This EEG study was done with scalp electrodes positioned according to the 10-20 International system of electrode placement. Electrical activity was acquired at a sampling rate of 500Hz  and reviewed with a high frequency filter of 70Hz  and a low frequency filter of 1Hz . EEG data were recorded continuously and digitally stored.   Description: The posterior dominant rhythm consists of 8-9 Hz activity of moderate voltage (25-35 uV) seen predominantly in posterior head regions, symmetric and reactive to eye opening and eye closing.  EEG showed intermittent generalized 3 to 6 Hz theta-delta slowing. Triphasic waves, generalized were also noted.  Physiology photic driving was not seen during photic stimulation.  Hyperventilation was not performed.     ABNORMALITY -Intermittent slow, generalized -Triphasic waves, generalized  IMPRESSION: This study is  suggestive of mild to moderate diffuse encephalopathy, nonspecific etiology but could be related to toxic-metabolic etiology.  No seizures or definite epileptiform discharges were seen throughout the recording.  Kathleen Griffith

## 2020-05-15 NOTE — H&P (View-Only) (Signed)
HPI: FU atrial fibrillation. Abdominal ultrasound May 2016 showed no aneurysm. Patient admitted with non-ST elevation myocardial infarction in June 2018. Cardiac catheterization revealed a 20% proximal to mid LAD and normal left ventricular end-diastolic pressure. Echocardiogram showed ejection fraction 61-44%, grade 2 diastolic dysfunction, mild tricuspid regurgitation, severely elevated pulmonary pressure. Sludge noted LV apex. 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 Was noted to have intermittent atrial fibrillation;felt to be secondary to stress of sepsis.Carotid Dopplers November 2020 showed 1 to 39% bilateral stenosis.  Echocardiogram May 2021 showed normal LV function.  Patient recently admitted with pneumonia.  She also was noted to be back in atrial fibrillation.  She was managed with rate control.  Plan was to proceed with cardioversion once her pneumonia was treated.  Sincelast seen, her pneumonia has improved since she was hospitalized.  She denies dyspnea, chest pain, palpitations or syncope.  She does have some fatigue.   Current Outpatient Medications  Medication Sig Dispense Refill  . acetaminophen (TYLENOL) 650 MG CR tablet Take 1,300 mg by mouth every 8 (eight) hours as needed for pain.    Marland Kitchen acidophilus (RISAQUAD) CAPS capsule Take 1 capsule by mouth daily.    Marland Kitchen albuterol (VENTOLIN HFA) 108 (90 Base) MCG/ACT inhaler Inhale 2 puffs into the lungs every 4 (four) hours as needed.    Marland Kitchen alendronate (FOSAMAX) 70 MG tablet Take 70 mg by mouth once a week.     . B-D UF III MINI PEN NEEDLES 31G X 5 MM MISC USE WITH LANTUS EVERY NIGHT AT BEDTIME 100 each 3  . budesonide (PULMICORT) 0.25 MG/2ML nebulizer solution Take 2 mLs (0.25 mg total) by nebulization 2 (two) times daily. 120 mL 0  . diltiazem (CARDIZEM CD) 120 MG 24 hr  capsule Take 1 capsule (120 mg total) by mouth daily. 30 capsule 0  . gabapentin (NEURONTIN) 100 MG capsule Take 1 capsule (100 mg total) by mouth at bedtime.    Marland Kitchen glucose blood (ONETOUCH ULTRA) test strip Use to test blood sugars 2 times daily. 100 each 12  . insulin glargine (LANTUS SOLOSTAR) 100 UNIT/ML Solostar Pen Inject 20 Units into the skin daily.    Marland Kitchen levalbuterol (XOPENEX) 0.63 MG/3ML nebulizer solution Take 3 mLs (0.63 mg total) by nebulization 2 (two) times daily. 180 mL 0  . metoprolol tartrate (LOPRESSOR) 25 MG tablet Take 1 tablet (25 mg total) by mouth every 8 (eight) hours. 90 tablet 0  . Multiple Vitamins-Minerals (CENTRUM SILVER 50+WOMEN PO) Take 1 tablet by mouth daily.    . nicotine (NICODERM CQ - DOSED IN MG/24 HOURS) 14 mg/24hr patch Place 1 patch (14 mg total) onto the skin daily. 28 patch 0  . nitroGLYCERIN (NITROSTAT) 0.3 MG SL tablet Place 1 tablet (0.3 mg total) under the tongue every 5 (five) minutes as needed for chest pain. 100 tablet 1  . omeprazole (PRILOSEC) 20 MG capsule Take 2 capsules (40 mg total) by mouth 2 (two) times daily. KEEP 06/01/20 OFFICE VISIT FOR FURTHER REFILLS (Patient taking differently: Take 40 mg by mouth 2 (two) times daily. ) 120 capsule 1  . pravastatin (PRAVACHOL) 20 MG tablet Take 20 mg by mouth at bedtime.    . sertraline (ZOLOFT) 100 MG tablet Take 50 mg by mouth daily.    . sucralfate (CARAFATE) 1 g tablet Take 1 tablet (1 g total) by mouth 4 (four)  times daily -  with meals and at bedtime. 120 tablet 2  . VITAMIN D PO Take 1 capsule by mouth daily.    Alveda Reasons 20 MG TABS tablet Take 20 mg by mouth at bedtime.     No current facility-administered medications for this visit.     Past Medical History:  Diagnosis Date  . Anemia    microcytic   . Arthritis   . Atrial fibrillation ([place])   . Cataract    REMOVED BILATERAL  . Diabetes mellitus ([place])   . Diverticulosis   . Duodenitis   . Fibromyalgia   . Gastric polyp   .  Gastroparesis   . Hyperlipidemia   . Hypertension   . Kidney stones   . Melanoma ([place])   . Microcytic anemia   . PMR (polymyalgia rheumatica) (HCC)   . PVD (peripheral vascular disease) ([place])   . Renal cyst   . Tubular adenoma of colon     Past Surgical History:  Procedure Laterality Date  . APPENDECTOMY    . BREAST BIOPSY    . CESAREAN SECTION    . COLONOSCOPY    . COLONOSCOPY WITH PROPOFOL N/A 12/26/2015   Procedure: COLONOSCOPY WITH PROPOFOL;  Surgeon: Manus Gunning, MD;  Location: [facility];  Service: Gastroenterology;  Laterality: N/A;  . ESOPHAGOGASTRODUODENOSCOPY (EGD) WITH PROPOFOL N/A 12/26/2015   Procedure: ESOPHAGOGASTRODUODENOSCOPY (EGD) WITH PROPOFOL;  Surgeon: Manus Gunning, MD;  Location: [facility];  Service: Gastroenterology;  Laterality: N/A;  . LEFT HEART CATH AND CORONARY ANGIOGRAPHY N/A 06/03/2017   Procedure: Left Heart Cath and Coronary Angiography;  Surgeon: Martinique, Peter M, MD;  Location: [facility];  Service: Cardiovascular;  Laterality: N/A;  . POLYPECTOMY      Social History   Socioeconomic History  . Marital status: Widowed    Spouse name: Not on file  . Number of children: 1  . Years of education: Not on file  . Highest education level: Not on file  Occupational History  . Not on file  Tobacco Use  . Smoking status: Former Smoker    Packs/day: 1.00    Years: 60.00    Pack years: 60.00    Types: Cigarettes  . Smokeless tobacco: Never Used  Substance and Sexual Activity  . Alcohol use: No    Alcohol/week: 0.0 standard drinks  . Drug use: No  . Sexual activity: Not on file  Other Topics Concern  . Not on file  Social History Narrative  . Not on file   Social Determinants of Health   Financial Resource Strain:   . Difficulty of Paying Living Expenses:   Food Insecurity:   . Worried About Charity fundraiser in the Last Year:   . Arboriculturist in the Last Year:   Transportation Needs:   . Lexicographer (Medical):   Marland Kitchen Lack of Transportation (Non-Medical):   Physical Activity:   . Days of Exercise per Week:   . Minutes of Exercise per Session:   Stress:   . Feeling of Stress :   Social Connections:   . Frequency of Communication with Friends and Family:   . Frequency of Social Gatherings with Friends and Family:   . Attends Religious Services:   . Active Member of Clubs or Organizations:   . Attends Archivist Meetings:   Marland Kitchen Marital Status:   Intimate Partner Violence:   . Fear of Current or Ex-Partner:   . Emotionally Abused:   .  Physically Abused:   . Sexually Abused:     Family History  Problem Relation Age of Onset  . CAD Father        MI and CABG  . Diabetes Father   . Heart disease Father   . Colon cancer Mother 35    ROS: no fevers or chills, productive cough, hemoptysis, dysphasia, odynophagia, melena, hematochezia, dysuria, hematuria, rash, seizure activity, orthopnea, PND, pedal edema, claudication. Remaining systems are negative.  Physical Exam: Well-developed well-nourished in no acute distress.  Skin is warm and dry.  HEENT is normal.  Neck is supple.  Chest is clear to auscultation with normal expansion.  Cardiovascular exam is irregular Abdominal exam nontender or distended. No masses palpated. Extremities show no edema. neuro grossly intact  ECG-atrial fibrillation at a rate of 94, normal axis, no ST changes.  Personally reviewed  A/P  1 persistent atrial fibrillation-patient remains in atrial fibrillation.  I think this was likely caused by the stress of her pneumonia.  She has now been treated and improved. She has been compliant with Xarelto and has not missed any doses.  We will arrange cardioversion.  Continue Cardizem but decrease metoprolol to 25 mg twice daily.  Hold metoprolol the night before and the morning of her procedure.  If she does not maintain sinus rhythm may need to consider antiarrhythmic.  2  hypertension-blood pressure controlled.  Continue present medical regimen.  3 hyperlipidemia-continue statin.  Note she did not tolerate higher doses in the past.  4 history of stress-induced cardiomyopathy-LV function has normalized and is normal on most recent study.  5 carotid artery disease/peripheral vascular disease-plan follow-up carotid Dopplers November 2021.  Continue statin.  She is not on aspirin given need for anticoagulation.  6 tobacco abuse-patient recently discontinued.  Kirk Ruths, MD

## 2020-05-16 LAB — BASIC METABOLIC PANEL
Anion gap: 10 (ref 5–15)
BUN: 17 mg/dL (ref 8–23)
CO2: 25 mmol/L (ref 22–32)
Calcium: 8.9 mg/dL (ref 8.9–10.3)
Chloride: 102 mmol/L (ref 98–111)
Creatinine, Ser: 1.19 mg/dL — ABNORMAL HIGH (ref 0.44–1.00)
GFR calc Af Amer: 50 mL/min — ABNORMAL LOW (ref >60)
GFR calc non Af Amer: 43 mL/min — ABNORMAL LOW (ref >60)
Glucose, Bld: 140 mg/dL — ABNORMAL HIGH (ref 70–99)
Potassium: 4.3 mmol/L (ref 3.5–5.1)
Sodium: 137 mmol/L (ref 135–145)

## 2020-05-16 LAB — CBC WITH DIFFERENTIAL/PLATELET
Abs Immature Granulocytes: 0.03 10*3/uL (ref 0.00–0.07)
Basophils Absolute: 0.1 10*3/uL (ref 0.0–0.1)
Basophils Relative: 1 %
Eosinophils Absolute: 0.2 10*3/uL (ref 0.0–0.5)
Eosinophils Relative: 3 %
HCT: 37.9 % (ref 36.0–46.0)
Hemoglobin: 11.8 g/dL — ABNORMAL LOW (ref 12.0–15.0)
Immature Granulocytes: 0 %
Lymphocytes Relative: 29 %
Lymphs Abs: 2.5 10*3/uL (ref 0.7–4.0)
MCH: 28.3 pg (ref 26.0–34.0)
MCHC: 31.1 g/dL (ref 30.0–36.0)
MCV: 90.9 fL (ref 80.0–100.0)
Monocytes Absolute: 0.6 10*3/uL (ref 0.1–1.0)
Monocytes Relative: 7 %
Neutro Abs: 5.1 10*3/uL (ref 1.7–7.7)
Neutrophils Relative %: 60 %
Platelets: 338 10*3/uL (ref 150–400)
RBC: 4.17 MIL/uL (ref 3.87–5.11)
RDW: 14.5 % (ref 11.5–15.5)
WBC: 8.6 10*3/uL (ref 4.0–10.5)
nRBC: 0 % (ref 0.0–0.2)

## 2020-05-16 LAB — SARS CORONAVIRUS 2 (TAT 6-24 HRS): SARS Coronavirus 2: NEGATIVE

## 2020-05-16 LAB — GLUCOSE, CAPILLARY
Glucose-Capillary: 156 mg/dL — ABNORMAL HIGH (ref 70–99)
Glucose-Capillary: 189 mg/dL — ABNORMAL HIGH (ref 70–99)
Glucose-Capillary: 113 mg/dL — ABNORMAL HIGH (ref 70–99)
Glucose-Capillary: 147 mg/dL — ABNORMAL HIGH (ref 70–99)

## 2020-05-16 MED ORDER — METOPROLOL TARTRATE 25 MG PO TABS
25.0000 mg | ORAL_TABLET | Freq: Three times a day (TID) | ORAL | Status: DC
  Administered 2020-05-16 – 2020-05-17 (×4): 25 mg via ORAL
  Filled 2020-05-16 (×4): qty 1

## 2020-05-16 NOTE — TOC Progression Note (Signed)
Transition of Care [facility]) - Progression Note    Patient Details  Name: [name] MRN: [id number] Date of Birth: [birth year]  Transition of Care [facility]) CM/SW [place], [place] Phone Number: 05/16/2020, 10:51 AM  Clinical Narrative:    Josem Kaufmann approved for [facility] SNF: [id number] approved from 5/17-5/19.   CSW has f/u with MD regarding new COVID and to see if pt will be able to d/c on 5/19.    Expected Discharge Plan: [place] Barriers to Discharge: Continued Medical Work up  Expected Discharge Plan and Services Expected Discharge Plan: [place]   Discharge Planning Services: CM Consult Post Acute Care Choice: [place] Living arrangements for the past 2 months: Apartment Expected Discharge Date: 05/14/20               DME Arranged: N/A DME Agency: NA       HH Arranged: NA HH Agency: [place] Date [facility] Agency Contacted: 05/09/20 Time [place]: [id number] Representative spoke with at [place]: Tommi Rumps awaiting call back  Readmission Risk Interventions No flowsheet data found.

## 2020-05-16 NOTE — Progress Notes (Addendum)
Progress Note  Patient Name: Kathleen Griffith Date of Encounter: 05/16/2020  Primary Cardiologist: Kirk Ruths, MD   Subjective    79 yo with PAF, COPD, PVD,  Admitted with pneumonia , hx of falls and atrial fib  Echo shows normal LV EF,   Ambulated with PT 05/17 .  She has sudden "muscle jerks" that may be contributing to her hx of frequent falls .   05/18: miserable night, had diarrhea yesterday and today, vomited x 1 today. Just does not feel like herself. SOB about the same.   Inpatient Medications    Scheduled Meds: . budesonide (PULMICORT) nebulizer solution  0.25 mg Nebulization BID  . diltiazem  120 mg Oral Daily  . docusate sodium  100 mg Oral BID  . feeding supplement (GLUCERNA SHAKE)  237 mL Oral TID BM  . gabapentin  100 mg Oral QHS  . insulin aspart  0-15 Units Subcutaneous TID WC  . insulin aspart  0-5 Units Subcutaneous QHS  . levalbuterol  0.63 mg Nebulization BID  . metoprolol tartrate  25 mg Oral BID  . multivitamin with minerals  1 tablet Oral Daily  . nicotine  14 mg Transdermal Daily  . pantoprazole  40 mg Oral BID  . pravastatin  20 mg Oral QHS  . rivaroxaban  15 mg Oral QHS  . sertraline  50 mg Oral Daily  . sucralfate  1 g Oral TID WC & HS   Continuous Infusions: . ceFEPime (MAXIPIME) IV 2 g (05/16/20 0921)   PRN Meds: acetaminophen **OR** acetaminophen, albuterol, bisacodyl, HYDROcodone-acetaminophen, morphine injection, ondansetron **OR** ondansetron (ZOFRAN) IV, polyethylene glycol, zolpidem   Vital Signs    Vitals:   05/16/20 0448 05/16/20 0500 05/16/20 0743 05/16/20 0746  BP: (!) 140/109     Pulse: 88  88   Resp: 16  18   Temp: 98.2 F (36.8 C)     TempSrc: Oral     SpO2: 95%  96% 96%  Weight:  68 kg    Height:        Intake/Output Summary (Last 24 hours) at 05/16/2020 1023 Last data filed at 05/16/2020 0535 Gross per 24 hour  Intake 600 ml  Output 1200 ml  Net -600 ml   Last 3 Weights 05/16/2020 05/15/2020 05/14/2020    Weight (lbs) 149 lb 14.6 oz 150 lb 5.7 oz 154 lb 12.2 oz  Weight (kg) 68 kg 68.2 kg 70.2 kg      Telemetry    Atrial fib, RVR  - Personally Reviewed  ECG    None today - Personally Reviewed  Physical Exam   GEN: No acute distress.   Neck: No JVD Cardiac: Irreg R&R, no murmurs, rubs, or gallops.  Respiratory: diminished to auscultation bilaterally with rales/rhonchi R>L GI: Soft, nontender, non-distended  MS: No edema; No deformity. Neuro:  Nonfocal  Psych: Normal affect   Labs    High Sensitivity Troponin:  No results for input(s): TROPONINIHS in the last 720 hours.    Chemistry Recent Labs  Lab 05/14/20 0604 05/15/20 0158 05/16/20 0344  NA 138 137 137  K 4.3 4.2 4.3  CL 100 101 102  CO2 30 29 25   GLUCOSE 167* 143* 140*  BUN 14 15 17   CREATININE 1.13* 1.19* 1.19*  CALCIUM 8.6* 8.8* 8.9  GFRNONAA 46* 43* 43*  GFRAA 54* 50* 50*  ANIONGAP 8 7 10      Hematology Recent Labs  Lab 05/14/20 0604 05/15/20 0158 05/16/20 0344  WBC 8.8 9.5  8.6  RBC 4.12 4.13 4.17  HGB 11.8* 11.7* 11.8*  HCT 38.1 37.8 37.9  MCV 92.5 91.5 90.9  MCH 28.6 28.3 28.3  MCHC 31.0 31.0 31.1  RDW 14.6 14.5 14.5  PLT 352 329 338    BNP Recent Labs  Lab 05/09/20 1031  BNP 1,775.3*     DDimer No results for input(s): DDIMER in the last 168 hours.   Radiology    EEG adult  Result Date: 05/15/2020 Lora Havens, MD     05/15/2020  3:39 PM Patient Name: Kathleen Griffith MRN: [id number] Epilepsy Attending: Lora Havens Referring Physician/Provider: Dr Lesia Sago Date:  05/15/2020 Duration: 33.40 mins Patient history: 79yo F admitted for pneumonia and noted to have bilateral extremities jerking movements. EEG to evaluate for seizure. Level of alertness: Awake AEDs during EEG study: Gabapentin Technical aspects: This EEG study was done with scalp electrodes positioned according to the 10-20 International system of electrode placement. Electrical activity was acquired at a  sampling rate of 500Hz  and reviewed with a high frequency filter of 70Hz  and a low frequency filter of 1Hz . EEG data were recorded continuously and digitally stored. Description: The posterior dominant rhythm consists of 8-9 Hz activity of moderate voltage (25-35 uV) seen predominantly in posterior head regions, symmetric and reactive to eye opening and eye closing. EEG showed intermittent generalized 3 to 6 Hz theta-delta slowing. Triphasic waves, generalized were also noted.  Physiology photic driving was not seen during photic stimulation.  Hyperventilation was not performed.   ABNORMALITY -Intermittent slow, generalized -Triphasic waves, generalized IMPRESSION: This study is  suggestive of mild to moderate diffuse encephalopathy, nonspecific etiology but could be related to toxic-metabolic etiology. No seizures or definite epileptiform discharges were seen throughout the recording. Lora Havens    Cardiac Studies     Patient Profile     79 y.o. female with atrial fib, COPD, frequent falls, admitted with pneumonia   Assessment & Plan      1. Persistent atrial fibrillation: - feel PNA was initial cause, now w/ GI issues - feel HR is elevated due to GI issues - BP may tolerate changing Lopressor to TID, will order  2. PNA: - on ABX - w/ diarrhea, consider C diff test if she has any more>>IM to manage  3. HTN: - SBP up over the last 24 hr - minimal increase in BB  4. Tobacco use: - cessation reinforced  5. Bradycardia - continue to follow on tele      For questions or updates, please contact [place] Please consult [email or web address] for contact info under        Signed, Rosaria Ferries, PA-C  05/16/2020, 10:23 AM     Attending Note:   The patient was seen and examined.  Agree with assessment and plan as noted above.  Changes made to the above note as needed.  Patient seen and independently examined with  Rosaria Ferries, PA .   We discussed all aspects of the  encounter. I agree with the assessment and plan as stated above.  1.   Atrial fib :  HR is gradually improving .  Cont current meds.  2. Pneumonia :  Cont abx 3. Diarrhea:  Further work up per Dr. Horris Latino   4.    I have spent a total of 40 minutes with patient reviewing hospital  notes , telemetry, EKGs, labs and examining patient as well as establishing an assessment and plan that was discussed with the patient. >  50% of time was spent in direct patient care.    Thayer Headings, Brooke Bonito., MD, [facility] 05/16/2020, 12:15 PM [address],  [place] Pager [phone]

## 2020-05-16 NOTE — Plan of Care (Signed)
  Problem: Elimination: Goal: Will not experience complications related to bowel motility Outcome: Progressing Goal: Will not experience complications related to urinary retention Outcome: Progressing   

## 2020-05-16 NOTE — Progress Notes (Addendum)
PROGRESS NOTE  Kathleen Griffith MLY:[id number] DOB: 03-Oct-1941 DOA: 05/06/2020 PCP: No primary care provider on file.  HPI/Recap of past 24 hours: HPI from Dr Lorrin Mais is an 79 y.o. female with medical history significant ofPVD: PMR; HTN; HLD; DM with gastroparesis; fibromyalgia; and afib presenting with refractory PNA.She reports that she was doing fine. She flew to [place] to visit a friend. The day after returning home, she was in afib (hasn't been in that in over a year). She fell the night before she came home for the first time in over a year. Things just went from bad to worse. She fell at home 5 days later. She has no energy, takes everything she can do to do anything. She gets up on the side of the chair or bed and has to sit there for an hour or more to get the energy to get up. She can't fix meals. She drinks water. Yesterday, she ate one piece of toast all day. Not really SOB, only with conversation or moving around. +cough, productive of greenish sputum. No fever. She saw Dr. Coletta Memos on 5/5 and he sent her for CXR and ankle xray (from fall); he told her he thought she had atypical PNA and called in azithromycin.    Today, patient reported nausea with an episode of vomiting recently ingested food, had 2 episodes of loose stools (has been taking docusate scheduled twice daily), denies any abdominal pain, chest pain, shortness of breath, fever/chills.  Patient still noted to have some RVR.  Cardiology following.    Assessment/Plan: Principal Problem:   CAP (community acquired pneumonia) Active Problems:   Tobacco abuse   Essential hypertension   Hyperlipidemia   Atrial fibrillation with RVR (HCC)   DNR (do not resuscitate)   Palliative care encounter   Multifocal CAP Currently afebrile, with resolved leukocytosis Currently saturating above 90% on room air Procalcitonin 0.95--> 0.12, downtrending Influenza, COVID-19 negative BC x2 NGTD Sputum  culture consistent with normal respiratory flora CTA chest showed extensive right upper lobe airspace opacity Chest x-ray showed initially right upper lobe pneumonia, repeat currently shows improved multifocal pneumonia Last dose of cefepime on 05/16/20 for a 7 day total, completed 9 days of azithromycin Duo nebs, Pulmicort, supplemental O2 as needed Continuous pulse ox, telemetry  Persistent A. Fib/Noted pauses on telemetry Echo showed EF of 50 to 55%, no regional wall motion abnormality noted Cardiology on board, increased metoprolol to 25 mg twice daily, continue Cardizem, plan for possible cardioversion once pneumonia is treated as an outpt Continue Xarelto Telemetry to monitor closely for pauses while on CCB/BB  ?? Fluid overload/acute on chronic systolic HF Improved BNP elevated 1,775 Echo showed EF of 50 to 55%, no regional wall motion abnormality Cardiology on board, s/p IV diuresis, stopped due to rising Cr Daily BMP  N/V Unknown etiology Supportive care Loose stools likely 2/2 laxative use, stopped docusate Monitor for worsening symptoms  Involuntary BLE jerking movement Unknown etiology Not uremic, electrolytes WNL  EEG unremarkable Monitor closely  Diabetes mellitus type 2 A1c 6.8 SSI, Accu-Cheks, hypoglycemic protocol  Hypertension Continue Lopressor, Cardizem  Hyperlipidemia Continue statins  Tobacco abuse Advised to quit Nicotine patch  [place] Patient is currently DNR, palliative consulted for further goals of care discussion         Malnutrition Type:  Nutrition Problem: Inadequate oral intake Etiology: decreased appetite   Malnutrition Characteristics:  Signs/Symptoms: meal completion < 50%   Nutrition Interventions:  Interventions: Glucerna shake, MVI, Magic cup  Estimated body mass index is 26.56 kg/m as calculated from the following:   Height as of this encounter: 5\' 3"  (1.6 m).   Weight as of this encounter: 68 kg.      Code Status: DNR  Family Communication: Discussed extensively with patient  Disposition Plan: Status is: Inpatient  Remains inpatient appropriate because:IV treatments appropriate due to intensity of illness or inability to take PO   Dispo: The patient is from: Home              Anticipated d/c is to: SNF              Anticipated d/c date is: 1 day on 05/17/20              Patient currently is not medically stable to d/c.,  Requiring IV antibiotics, cardiology signing off   Consultants:  Cardiology  Procedures:  None  Antimicrobials:  Cefepime  DVT prophylaxis: Xarelto   Objective: Vitals:   05/16/20 0500 05/16/20 0743 05/16/20 0746 05/16/20 1322  BP:    117/85  Pulse:  88  86  Resp:  18  18  Temp:    (!) 97.4 F (36.3 C)  TempSrc:    Oral  SpO2:  96% 96% 94%  Weight: 68 kg     Height:        Intake/Output Summary (Last 24 hours) at 05/16/2020 1426 Last data filed at 05/16/2020 0535 Gross per 24 hour  Intake 240 ml  Output 600 ml  Net -360 ml   Filed Weights   05/14/20 0500 05/15/20 0626 05/16/20 0500  Weight: 70.2 kg 68.2 kg 68 kg    Exam:  General: NAD  Cardiovascular: S1, S2 present  Respiratory:  Diminished breath sounds bilaterally  Abdomen: Soft, nontender, nondistended, bowel sounds present  Musculoskeletal: No bilateral pedal edema noted  Skin: Normal  Psychiatry: Normal mood   Data Reviewed: CBC: Recent Labs  Lab 05/12/20 0646 05/13/20 0237 05/14/20 0604 05/15/20 0158 05/16/20 0344  WBC 9.8 12.1* 8.8 9.5 8.6  NEUTROABS 7.1 8.9* 6.2 6.1 5.1  HGB 11.8* 10.7* 11.8* 11.7* 11.8*  HCT 38.4 34.7* 38.1 37.8 37.9  MCV 92.3 91.8 92.5 91.5 90.9  PLT 369 369 352 329 076   Basic Metabolic Panel: Recent Labs  Lab 05/12/20 0646 05/13/20 0237 05/14/20 0604 05/15/20 0158 05/15/20 1304 05/16/20 0344  NA 138 137 138 137  --  137  K 3.7 4.2 4.3 4.2  --  4.3  CL 98 101 100 101  --  102  CO2 31 30 30 29   --  25  GLUCOSE 145*  138* 167* 143*  --  140*  BUN 13 13 14 15   --  17  CREATININE 1.21* 1.19* 1.13* 1.19*  --  1.19*  CALCIUM 8.4* 8.2* 8.6* 8.8*  --  8.9  MG  --   --   --   --  1.8  --    GFR: Estimated Creatinine Clearance: 35.5 mL/min (A) (by C-G formula based on SCr of 1.19 mg/dL (H)). Liver Function Tests: No results for input(s): AST, ALT, ALKPHOS, BILITOT, PROT, ALBUMIN in the last 168 hours. No results for input(s): LIPASE, AMYLASE in the last 168 hours. No results for input(s): AMMONIA in the last 168 hours. Coagulation Profile: No results for input(s): INR, PROTIME in the last 168 hours. Cardiac Enzymes: No results for input(s): CKTOTAL, CKMB, CKMBINDEX, TROPONINI in the last 168 hours. BNP (last 3 results) No results for input(s): PROBNP in  the last 8760 hours. HbA1C: No results for input(s): HGBA1C in the last 72 hours. CBG: Recent Labs  Lab 05-28-2020 1108 28-May-2020 1621 2020-05-28 2226 05/16/20 0622 05/16/20 1146  GLUCAP 197* 144* 151* 156* 189*   Lipid Profile: No results for input(s): CHOL, HDL, LDLCALC, TRIG, CHOLHDL, LDLDIRECT in the last 72 hours. Thyroid Function Tests: No results for input(s): TSH, T4TOTAL, FREET4, T3FREE, THYROIDAB in the last 72 hours. Anemia Panel: No results for input(s): VITAMINB12, FOLATE, FERRITIN, TIBC, IRON, RETICCTPCT in the last 72 hours. Urine analysis:    Component Value Date/Time   COLORURINE STRAW (A) 12/27/2019 2333   APPEARANCEUR CLEAR 12/27/2019 2333   LABSPEC 1.006 12/27/2019 2333   PHURINE 8.0 12/27/2019 2333   GLUCOSEU NEGATIVE 12/27/2019 2333   HGBUR NEGATIVE 12/27/2019 2333   BILIRUBINUR NEGATIVE 12/27/2019 [place] 12/27/2019 2333   PROTEINUR 30 (A) 12/27/2019 2333   NITRITE NEGATIVE 12/27/2019 2333   LEUKOCYTESUR TRACE (A) 12/27/2019 2333   Sepsis Labs: @LABRCNTIP (procalcitonin:4,lacticidven:4)  ) Recent Results (from the past 240 hour(s))  Expectorated sputum assessment w rflx to resp cult     Status:  None   Collection Time: 05/09/20 12:48 PM   Specimen: Expectorated Sputum  Result Value Ref Range Status   Specimen Description Expect. Sput  Final   Special Requests NONE  Final   Sputum evaluation   Final    THIS SPECIMEN IS ACCEPTABLE FOR SPUTUM CULTURE Performed at [facility], [address] [place], [place] [address]    Report Status 05/09/2020 FINAL  Final  Culture, respiratory     Status: None   Collection Time: 05/09/20 12:48 PM  Result Value Ref Range Status   Specimen Description Expect. Sput  Final   Special Requests NONE Reflexed from [id number]  Final   Gram Stain   Final    RARE WBC PRESENT, PREDOMINANTLY PMN FEW SQUAMOUS EPITHELIAL CELLS PRESENT MODERATE GRAM NEGATIVE RODS FEW GRAM POSITIVE COCCI FEW GRAM POSITIVE RODS    Culture   Final    Consistent with normal respiratory flora. Performed at [facility], [place] [address] [place], [place] [address]    Report Status 05/11/2020 FINAL  Final      Studies: EEG adult  Result Date: 05/28/2020 Lora Havens, MD     2020/05/28  3:39 PM Patient Name: Beryl Hornberger MRN: [id number] Epilepsy Attending: Lora Havens Referring Physician/Provider: Dr Lesia Sago Date:  May 28, 2020 Duration: 33.40 mins Patient history: 79yo F admitted for pneumonia and noted to have bilateral extremities jerking movements. EEG to evaluate for seizure. Level of alertness: Awake AEDs during EEG study: Gabapentin Technical aspects: This EEG study was done with scalp electrodes positioned according to the 10-20 International system of electrode placement. Electrical activity was acquired at a sampling rate of 500Hz  and reviewed with a high frequency filter of 70Hz  and a low frequency filter of 1Hz . EEG data were recorded continuously and digitally stored. Description: The posterior dominant rhythm consists of 8-9 Hz activity of moderate voltage (25-35 uV) seen predominantly in posterior head regions, symmetric and reactive to eye  opening and eye closing. EEG showed intermittent generalized 3 to 6 Hz theta-delta slowing. Triphasic waves, generalized were also noted.  Physiology photic driving was not seen during photic stimulation.  Hyperventilation was not performed.   ABNORMALITY -Intermittent slow, generalized -Triphasic waves, generalized IMPRESSION: This study is  suggestive of mild to moderate diffuse encephalopathy, nonspecific etiology but could be related to toxic-metabolic etiology. No  seizures or definite epileptiform discharges were seen throughout the recording. Priyanka Barbra Sarks    Scheduled Meds: . budesonide (PULMICORT) nebulizer solution  0.25 mg Nebulization BID  . diltiazem  120 mg Oral Daily  . docusate sodium  100 mg Oral BID  . feeding supplement (GLUCERNA SHAKE)  237 mL Oral TID BM  . gabapentin  100 mg Oral QHS  . insulin aspart  0-15 Units Subcutaneous TID WC  . insulin aspart  0-5 Units Subcutaneous QHS  . levalbuterol  0.63 mg Nebulization BID  . metoprolol tartrate  25 mg Oral Q8H  . multivitamin with minerals  1 tablet Oral Daily  . nicotine  14 mg Transdermal Daily  . pantoprazole  40 mg Oral BID  . pravastatin  20 mg Oral QHS  . rivaroxaban  15 mg Oral QHS  . sertraline  50 mg Oral Daily  . sucralfate  1 g Oral TID WC & HS    Continuous Infusions: . ceFEPime (MAXIPIME) IV 2 g (05/16/20 0921)     LOS: 10 days     Alma Friendly, MD [facility]  If 7PM-7AM, please contact night-coverage [email or web address] 05/16/2020, 2:26 PM

## 2020-05-16 NOTE — Telephone Encounter (Signed)
Follow up scheduled 05/22/20

## 2020-05-17 DIAGNOSIS — E119 Type 2 diabetes mellitus without complications: Secondary | ICD-10-CM

## 2020-05-17 DIAGNOSIS — E7800 Pure hypercholesterolemia, unspecified: Secondary | ICD-10-CM

## 2020-05-17 DIAGNOSIS — E782 Mixed hyperlipidemia: Secondary | ICD-10-CM

## 2020-05-17 DIAGNOSIS — E118 Type 2 diabetes mellitus with unspecified complications: Secondary | ICD-10-CM

## 2020-05-17 LAB — GLUCOSE, CAPILLARY
Glucose-Capillary: 134 mg/dL — ABNORMAL HIGH (ref 70–99)
Glucose-Capillary: 148 mg/dL — ABNORMAL HIGH (ref 70–99)
Glucose-Capillary: 204 mg/dL — ABNORMAL HIGH (ref 70–99)

## 2020-05-17 LAB — CBC WITH DIFFERENTIAL/PLATELET
Abs Immature Granulocytes: 0.03 10*3/uL (ref 0.00–0.07)
Basophils Absolute: 0.1 10*3/uL (ref 0.0–0.1)
Basophils Relative: 1 %
Eosinophils Absolute: 0.2 10*3/uL (ref 0.0–0.5)
Eosinophils Relative: 2 %
HCT: 38.1 % (ref 36.0–46.0)
Hemoglobin: 11.9 g/dL — ABNORMAL LOW (ref 12.0–15.0)
Immature Granulocytes: 0 %
Lymphocytes Relative: 28 %
Lymphs Abs: 2.3 10*3/uL (ref 0.7–4.0)
MCH: 28.6 pg (ref 26.0–34.0)
MCHC: 31.2 g/dL (ref 30.0–36.0)
MCV: 91.6 fL (ref 80.0–100.0)
Monocytes Absolute: 0.6 10*3/uL (ref 0.1–1.0)
Monocytes Relative: 7 %
Neutro Abs: 5.3 10*3/uL (ref 1.7–7.7)
Neutrophils Relative %: 62 %
Platelets: 344 10*3/uL (ref 150–400)
RBC: 4.16 MIL/uL (ref 3.87–5.11)
RDW: 14.6 % (ref 11.5–15.5)
WBC: 8.5 10*3/uL (ref 4.0–10.5)
nRBC: 0 % (ref 0.0–0.2)

## 2020-05-17 LAB — BASIC METABOLIC PANEL
Anion gap: 11 (ref 5–15)
BUN: 21 mg/dL (ref 8–23)
CO2: 25 mmol/L (ref 22–32)
Calcium: 8.9 mg/dL (ref 8.9–10.3)
Chloride: 101 mmol/L (ref 98–111)
Creatinine, Ser: 1.26 mg/dL — ABNORMAL HIGH (ref 0.44–1.00)
GFR calc Af Amer: 47 mL/min — ABNORMAL LOW (ref >60)
GFR calc non Af Amer: 40 mL/min — ABNORMAL LOW (ref >60)
Glucose, Bld: 174 mg/dL — ABNORMAL HIGH (ref 70–99)
Potassium: 4.2 mmol/L (ref 3.5–5.1)
Sodium: 137 mmol/L (ref 135–145)

## 2020-05-17 MED ORDER — DILTIAZEM HCL ER COATED BEADS 120 MG PO CP24: 120.0000 mg | ORAL_CAPSULE | Freq: Every day | ORAL | 0 refills | Status: DC

## 2020-05-17 MED ORDER — NICOTINE 14 MG/24HR TD PT24: 14.0000 mg | MEDICATED_PATCH | Freq: Every day | TRANSDERMAL | 0 refills | Status: DC

## 2020-05-17 MED ORDER — METOPROLOL TARTRATE 25 MG PO TABS: 25.0000 mg | ORAL_TABLET | Freq: Three times a day (TID) | ORAL | 0 refills | Status: DC

## 2020-05-17 MED ORDER — GABAPENTIN 100 MG PO CAPS
100.0000 mg | ORAL_CAPSULE | Freq: Every day | ORAL | Status: AC
Start: 2020-05-17 — End: ?

## 2020-05-17 MED ORDER — LEVALBUTEROL HCL 0.63 MG/3ML IN NEBU: 0.6300 mg | INHALATION_SOLUTION | Freq: Two times a day (BID) | RESPIRATORY_TRACT | 0 refills | Status: DC

## 2020-05-17 MED ORDER — RISAQUAD PO CAPS
1.0000 | ORAL_CAPSULE | Freq: Every day | ORAL | Status: DC
  Administered 2020-05-17: 1 via ORAL
  Filled 2020-05-17: qty 1

## 2020-05-17 MED ORDER — BUDESONIDE 0.25 MG/2ML IN SUSP: 0.2500 mg | Freq: Two times a day (BID) | RESPIRATORY_TRACT | 0 refills | Status: DC

## 2020-05-17 MED ORDER — LANTUS SOLOSTAR 100 UNIT/ML ~~LOC~~ SOPN: 20.0000 [IU] | PEN_INJECTOR | Freq: Every day | SUBCUTANEOUS | Status: AC

## 2020-05-17 MED ORDER — DILTIAZEM HCL ER COATED BEADS 120 MG PO CP24: 120.0000 mg | ORAL_CAPSULE | Freq: Every day | ORAL | Status: DC

## 2020-05-17 MED ORDER — LEVALBUTEROL HCL 0.63 MG/3ML IN NEBU: 0.6300 mg | INHALATION_SOLUTION | Freq: Two times a day (BID) | RESPIRATORY_TRACT | 12 refills | Status: DC

## 2020-05-17 MED ORDER — RISAQUAD PO CAPS: 1.0000 | ORAL_CAPSULE | Freq: Every day | ORAL | Status: DC

## 2020-05-17 MED ORDER — BUDESONIDE 0.25 MG/2ML IN SUSP: 0.2500 mg | Freq: Two times a day (BID) | RESPIRATORY_TRACT | 12 refills | Status: DC

## 2020-05-17 MED ORDER — INSULIN ASPART 100 UNIT/ML ~~LOC~~ SOLN: 0.0000 [IU] | Freq: Three times a day (TID) | SUBCUTANEOUS | 11 refills | Status: DC

## 2020-05-17 MED ORDER — INSULIN ASPART 100 UNIT/ML ~~LOC~~ SOLN: 0.0000 [IU] | Freq: Every day | SUBCUTANEOUS | 11 refills | Status: DC

## 2020-05-17 MED ORDER — METOPROLOL TARTRATE 25 MG PO TABS: 25.0000 mg | ORAL_TABLET | Freq: Three times a day (TID) | ORAL | Status: DC

## 2020-05-17 MED ORDER — ZOLPIDEM TARTRATE 5 MG PO TABS: 5.0000 mg | ORAL_TABLET | Freq: Every evening | ORAL | 0 refills | Status: DC | PRN

## 2020-05-17 NOTE — Progress Notes (Signed)
Occupational Therapy Treatment Patient Details Name: Kathleen Griffith MRN: [id number] DOB: 1941/02/21 Today's Date: 05/17/2020    History of present illness 79 y.o. female with a hx PAF, COPD, PVD, PMR, hypertension, hyperlipidemia, DM, fibromyalgia admitted to [facility] on 5/8 with PNA, recent falls, afib.   OT comments  Patient reports not feeling well "I don't know what's going on with me." Agreeable to sit up EOB for g/h. Patient supervision level for safety with balance due to mild tremors in extremities. Patient min A to power up to standing with walker with cues for hand placement and increased time to side step towards head of bed. Patient expresses wanting to go to rehab to get stronger before returning home.   Follow Up Recommendations  SNF    Equipment Recommendations  Tub/shower bench       Precautions / Restrictions Precautions Precautions: Fall Precaution Comments: involuntary LE jerking during mobility - new on 5/17 and pt reports it has been going on for the past 3 days Restrictions Weight Bearing Restrictions: No       Mobility Bed Mobility Overal bed mobility: Needs Assistance Bed Mobility: Supine to Sit;Sit to Supine     Supine to sit: Min guard;HOB elevated Sit to supine: Min guard   General bed mobility comments: min guard for safety, Increased time and effort with use of HOB elevation and bedrails to perform.  Transfers Overall transfer level: Needs assistance Equipment used: Rolling walker (2 wheeled) Transfers: Sit to/from Stand Sit to Stand: Min assist         General transfer comment: min A to power up to standing, cues for hand placement, requires increased time to initiate/decreased energy    Balance Overall balance assessment: Needs assistance Sitting-balance support: Feet supported Sitting balance-Leahy Scale: Fair                                     ADL either performed or assessed with clinical judgement   ADL Overall  ADL's : Needs assistance/impaired     Grooming: Oral care;Wash/dry face;Supervision/safety;Sitting Grooming Details (indicate cue type and reason): mild tremors in LEs, supervision for safety                  Toilet Transfer: Minimal assistance;Cueing for safety;RW Toilet Transfer Details (indicate cue type and reason): simulated with functional mobility, min A to power up to standing, decreased strength and increased time to complete task. cues for hand placement         Functional mobility during ADLs: Minimal assistance;Rolling walker;Cueing for safety General ADL Comments: pt continues to present with increased fatigued and decreased activity tolerance impacting pts ability to engage in BADLs.                Cognition Arousal/Alertness: Awake/alert Behavior During Therapy: Flat affect Overall Cognitive Status: Within Functional Limits for tasks assessed                                 General Comments: encouragement to participate in seated ADL tasks, reports not feeling well                    Pertinent Vitals/ Pain       Pain Assessment: Faces Faces Pain Scale: Hurts a little bit Pain Location: generalized Pain Descriptors / Indicators: Discomfort Pain Intervention(s): Limited activity within patient's  tolerance         Frequency  Min 2X/week        Progress Toward Goals  OT Goals(current goals can now be found in the care plan section)  Progress towards OT goals: Progressing toward goals  Acute Rehab OT Goals Patient Stated Goal: go to rehab to get stronger OT Goal Formulation: With patient Time For Goal Achievement: 05/23/20 Potential to Achieve Goals: Good ADL Goals Pt Will Perform Grooming: with min guard assist;with supervision;standing Pt Will Perform Upper Body Bathing: with supervision;with set-up;sitting Pt Will Perform Lower Body Bathing: with min assist Pt Will Perform Upper Body Dressing: with supervision;with  set-up;sitting Pt Will Perform Lower Body Dressing: with min assist Pt Will Transfer to Toilet: with min guard assist;with supervision;ambulating;regular height toilet;bedside commode;grab bars Pt Will Perform Toileting - Clothing Manipulation and hygiene: with min guard assist;sit to/from stand Pt Will Perform Tub/Shower Transfer: with min assist;with min guard assist;tub bench;3 in 1 Additional ADL Goal #1: Pt  will verbalize and demo 3 energy conservation techniques for ADLs and ADL mobility  Plan Discharge plan needs to be updated       AM-PAC OT "6 Clicks" Daily Activity     Outcome Measure   Help from another person eating meals?: None Help from another person taking care of personal grooming?: A Little Help from another person toileting, which includes using toliet, bedpan, or urinal?: A Lot Help from another person bathing (including washing, rinsing, drying)?: A Lot Help from another person to put on and taking off regular upper body clothing?: A Little Help from another person to put on and taking off regular lower body clothing?: A Lot 6 Click Score: 16    End of Session Equipment Utilized During Treatment: Rolling walker  OT Visit Diagnosis: Unsteadiness on feet (R26.81);Muscle weakness (generalized) (M62.81);History of falling (Z91.81)   Activity Tolerance Patient limited by fatigue   Patient Left in bed;with call bell/phone within reach;with nursing/sitter in room   Nurse Communication Mobility status        Time: 3557-3220 OT Time Calculation (min): 21 min  Charges: OT General Charges $OT Visit: 1 Visit OT Treatments $Self Care/Home Management : 8-22 mins  Delbert Phenix OT OT office: [place] 05/17/2020, 11:43 AM

## 2020-05-17 NOTE — Progress Notes (Signed)
Nsg Discharge Note  Patient discharged home with [facility] services. Neb machine and oxygen delivered to room. Son providing transportation.  Admit Date:  05/06/2020 Discharge date: 05/17/2020   Liane Comber to be D/C'd Home per MD order.  AVS completed.  Copy for chart, and copy for patient signed, and dated. Patient/caregiver able to verbalize understanding.  Discharge Medication: Allergies as of 05/17/2020      Reactions   Anoro Ellipta [umeclidinium-vilanterol] Palpitations   Losartan Shortness Of Breath   wheezing wheezing   Norvasc [amlodipine Besylate] Swelling   Pregabalin Swelling   TO LOWER EXTREMTIES TO LOWER EXTREMTIES   Bupropion    Other reaction(s): Other Sucidal thoughts   Eliquis [apixaban] Nausea And Vomiting   Severe stomach pain   Metoclopramide Other (See Comments)   Tardive dyskinesia, tremors   Lisinopril Cough   Metformin Diarrhea   Metformin And Related Diarrhea      Medication List    STOP taking these medications   naproxen sodium 220 MG tablet Commonly known as: ALEVE     TAKE these medications   acetaminophen 650 MG CR tablet Commonly known as: TYLENOL Take 1,300 mg by mouth every 8 (eight) hours as needed for pain.   acidophilus Caps capsule Take 1 capsule by mouth daily. Start taking on: May 18, 2020   albuterol 108 (90 Base) MCG/ACT inhaler Commonly known as: VENTOLIN HFA Inhale 2 puffs into the lungs every 4 (four) hours as needed.   alendronate 70 MG tablet Commonly known as: FOSAMAX Take 70 mg by mouth once a week.   B-D UF III MINI PEN NEEDLES 31G X 5 MM Misc Generic drug: Insulin Pen Needle USE WITH LANTUS EVERY NIGHT AT BEDTIME   budesonide 0.25 MG/2ML nebulizer solution Commonly known as: PULMICORT Take 2 mLs (0.25 mg total) by nebulization 2 (two) times daily.   CENTRUM SILVER 50+WOMEN PO Take 1 tablet by mouth daily.   diltiazem 120 MG 24 hr capsule Commonly known as: CARDIZEM CD Take 1 capsule (120 mg total) by  mouth daily. Start taking on: May 18, 2020   gabapentin 100 MG capsule Commonly known as: NEURONTIN Take 1 capsule (100 mg total) by mouth at bedtime.   Lantus SoloStar 100 UNIT/ML Solostar Pen Generic drug: insulin glargine Inject 20 Units into the skin daily. What changed:   how much to take  how to take this  when to take this  additional instructions   levalbuterol 0.63 MG/3ML nebulizer solution Commonly known as: XOPENEX Take 3 mLs (0.63 mg total) by nebulization 2 (two) times daily.   metoprolol tartrate 25 MG tablet Commonly known as: LOPRESSOR Take 1 tablet (25 mg total) by mouth every 8 (eight) hours. What changed: when to take this   nicotine 14 mg/24hr patch Commonly known as: NICODERM CQ - dosed in mg/24 hours Place 1 patch (14 mg total) onto the skin daily. Start taking on: May 18, 2020   nitroGLYCERIN 0.3 MG SL tablet Commonly known as: NITROSTAT Place 1 tablet (0.3 mg total) under the tongue every 5 (five) minutes as needed for chest pain.   omeprazole 20 MG capsule Commonly known as: PRILOSEC Take 2 capsules (40 mg total) by mouth 2 (two) times daily. KEEP 06/01/20 OFFICE VISIT FOR FURTHER REFILLS What changed: additional instructions   OneTouch Ultra test strip Generic drug: glucose blood Use to test blood sugars 2 times daily.   pravastatin 20 MG tablet Commonly known as: PRAVACHOL Take 20 mg by mouth at bedtime.  sertraline 100 MG tablet Commonly known as: ZOLOFT Take 50 mg by mouth daily.   sucralfate 1 g tablet Commonly known as: Carafate Take 1 tablet (1 g total) by mouth 4 (four) times daily -  with meals and at bedtime.   VITAMIN D PO Take 1 capsule by mouth daily.   Xarelto 20 MG Tabs tablet Generic drug: rivaroxaban Take 20 mg by mouth at bedtime.            Durable Medical Equipment  (From admission, onward)         Start     Ordered   05/17/20 1531  For home use only DME oxygen  Once    Question Answer Comment   Length of Need 6 Months   Mode or (Route) Nasal cannula   Liters per Minute 2   Frequency Continuous (stationary and portable oxygen unit needed)   Oxygen delivery system Gas      05/17/20 1530   05/17/20 1458  For home use only DME Nebulizer machine  Once    Question Answer Comment  Patient needs a nebulizer to treat with the following condition COPD (chronic obstructive pulmonary disease) ([place])   Length of Need Lifetime      05/17/20 1457          Discharge Assessment: Vitals:   05/17/20 0900 05/17/20 1303  BP:  (!) 106/52  Pulse:  85  Resp:  16  Temp:  97.9 F (36.6 C)  SpO2: 99% 95%   Skin clean, dry and intact without evidence of skin break down, no evidence of skin tears noted. IV catheter discontinued intact. Site without signs and symptoms of complications - no redness or edema noted at insertion site, patient denies c/o pain - only slight tenderness at site.  Dressing with slight pressure applied.  D/c Instructions-Education: Discharge instructions given to patient/family with verbalized understanding. D/c education completed with patient/family including follow up instructions, medication list, d/c activities limitations if indicated, with other d/c instructions as indicated by MD - patient able to verbalize understanding, all questions fully answered. Patient instructed to return to ED, call 911, or call MD for any changes in condition.  Patient escorted via [place], and D/C home via private auto.  Erasmo Leventhal, RN 05/17/2020 6:46 PM

## 2020-05-17 NOTE — TOC Progression Note (Addendum)
Transition of Care [facility]) - Progression Note    Patient Details  Name: Kloe Oates MRN: [id number] Date of Birth: [birth year]  Transition of [facility]) CM/SW Contact  [name] [name], RN Phone Number: 05/17/2020, 2:50 PM  Clinical Narrative:     Spoke to patient and her son Gerald Stabs at bedside. They have reviewed SNF bed offers. They prefer to go home with home health. Patient already has tub bench, walker, transport chair, wheel chair and 3 in 1.    Gerald Stabs (son) and Judson Roch ( DIL) live in apartment across the hall from patient. Gerald Stabs gets off work everyday at 2 pm, Judson Roch works from home. Family will provide 24/7 care.  Called Cory with [facility] regarding update on plan. Awaiting call back.  Tommi Rumps with [facility] accepted referral.  NEB machine ordered with Zac with [facility]. Nurse checking ambulatory saturation.   Patient and her son Gerald Stabs aware of all of above and voiced understanding.   Gerald Stabs will go home get patient's Lucianne Lei and come back to transport home. Ordered home oxygen with Zac with [facility]   Expected Discharge Plan: [place] Barriers to Discharge: Continued Medical Work up  Expected Discharge Plan and Services Expected Discharge Plan: [place]   Discharge Planning Services: CM Consult Post Acute Care Choice: [place] Living arrangements for the past 2 months: Apartment Expected Discharge Date: 05/18/20               DME Arranged: N/A DME Agency: NA       HH Arranged: NA HH Agency: [place] Date [facility] Agency Contacted: 05/09/20 Time [place]: 1003 Representative spoke with at [place]: Tommi Rumps awaiting call back   Social Determinants of Health (SDOH) Interventions    Readmission Risk Interventions No flowsheet data found.

## 2020-05-17 NOTE — Progress Notes (Addendum)
Progress Note  Patient Name: Kathleen Griffith Date of Encounter: 05/17/2020  Primary Cardiologist: Kirk Ruths, MD   Subjective    79 yo with PAF, COPD, PVD,  Admitted with pneumonia , hx of falls and atrial fib  Echo shows normal LV EF,   Ambulated with PT 05/17 .  She has sudden "muscle jerks" that may be contributing to her hx of frequent falls .   05/18: miserable night, had diarrhea yesterday and today, vomited x 1 today. Just does not feel like herself. SOB about the same.  5/19 feeling better she did sleep no chest pain.  Inpatient Medications    Scheduled Meds: . acidophilus  1 capsule Oral Daily  . budesonide (PULMICORT) nebulizer solution  0.25 mg Nebulization BID  . diltiazem  120 mg Oral Daily  . feeding supplement (GLUCERNA SHAKE)  237 mL Oral TID BM  . gabapentin  100 mg Oral QHS  . insulin aspart  0-15 Units Subcutaneous TID WC  . insulin aspart  0-5 Units Subcutaneous QHS  . levalbuterol  0.63 mg Nebulization BID  . metoprolol tartrate  25 mg Oral Q8H  . multivitamin with minerals  1 tablet Oral Daily  . nicotine  14 mg Transdermal Daily  . pantoprazole  40 mg Oral BID  . pravastatin  20 mg Oral QHS  . rivaroxaban  15 mg Oral QHS  . sertraline  50 mg Oral Daily  . sucralfate  1 g Oral TID WC & HS   Continuous Infusions: . ceFEPime (MAXIPIME) IV 2 g (05/16/20 2111)   PRN Meds: acetaminophen **OR** acetaminophen, albuterol, bisacodyl, HYDROcodone-acetaminophen, ondansetron **OR** ondansetron (ZOFRAN) IV, polyethylene glycol, zolpidem   Vital Signs    Vitals:   05/16/20 1946 05/16/20 2107 05/17/20 0005 05/17/20 0606  BP:  (!) 114/56 (!) 116/50 109/74  Pulse:  88 94 86  Resp:  20 16 17   Temp:  98 F (36.7 C) 98 F (36.7 C) 97.9 F (36.6 C)  TempSrc:  Oral Oral Oral  SpO2: 96% 94% 95% 95%  Weight:    65.6 kg  Height:        Intake/Output Summary (Last 24 hours) at 05/17/2020 0855 Last data filed at 05/17/2020 5409 Gross per 24 hour    Intake 240 ml  Output 300 ml  Net -60 ml   Last 3 Weights 05/17/2020 05/16/2020 05/15/2020  Weight (lbs) 144 lb 10 oz 149 lb 14.6 oz 150 lb 5.7 oz  Weight (kg) 65.6 kg 68 kg 68.2 kg      Telemetry    A fib rate controlled at times a few seconds to 44 HR brief pauses - Personally Reviewed  ECG    No new - Personally Reviewed  Physical Exam   Physical Exam: Blood pressure 109/74, pulse 86, temperature 97.9 F (36.6 C), temperature source Oral, resp. rate 17, height 5\' 3"  (1.6 m), weight 65.6 kg, SpO2 99 %.  GEN:   Chronically ill appearing female,  HEENT: Normal NECK: No JVD; No carotid bruits LYMPHATICS: No lymphadenopathy CARDIAC:  Irreg. Irreg.  RESPIRATORY:  Clear to auscultation without rales, wheezing or rhonchi  ABDOMEN: Soft, non-tender, non-distended MUSCULOSKELETAL:  No edema; No deformity  SKIN: Warm and dry NEUROLOGIC:  Alert and oriented x 3   Labs    High Sensitivity Troponin:  No results for input(s): TROPONINIHS in the last 720 hours.    Chemistry Recent Labs  Lab 05/15/20 0158 05/16/20 0344 05/17/20 0224  NA 137 137 137  K  4.2 4.3 4.2  CL 101 102 101  CO2 29 25 25   GLUCOSE 143* 140* 174*  BUN 15 17 21   CREATININE 1.19* 1.19* 1.26*  CALCIUM 8.8* 8.9 8.9  GFRNONAA 43* 43* 40*  GFRAA 50* 50* 47*  ANIONGAP 7 10 11      Hematology Recent Labs  Lab 05/15/20 0158 05/16/20 0344 05/17/20 0224  WBC 9.5 8.6 8.5  RBC 4.13 4.17 4.16  HGB 11.7* 11.8* 11.9*  HCT 37.8 37.9 38.1  MCV 91.5 90.9 91.6  MCH 28.3 28.3 28.6  MCHC 31.0 31.1 31.2  RDW 14.5 14.5 14.6  PLT 329 338 344    BNPNo results for input(s): BNP, PROBNP in the last 168 hours.   DDimer No results for input(s): DDIMER in the last 168 hours.   Radiology    EEG adult  Result Date: 05/15/2020 Lora Havens, MD     05/15/2020  3:39 PM Patient Name: Sharry Beining MRN: [id number] Epilepsy Attending: Lora Havens Referring Physician/Provider: Dr Lesia Sago Date:   05/15/2020 Duration: 33.40 mins Patient history: 79yo F admitted for pneumonia and noted to have bilateral extremities jerking movements. EEG to evaluate for seizure. Level of alertness: Awake AEDs during EEG study: Gabapentin Technical aspects: This EEG study was done with scalp electrodes positioned according to the 10-20 International system of electrode placement. Electrical activity was acquired at a sampling rate of 500Hz  and reviewed with a high frequency filter of 70Hz  and a low frequency filter of 1Hz . EEG data were recorded continuously and digitally stored. Description: The posterior dominant rhythm consists of 8-9 Hz activity of moderate voltage (25-35 uV) seen predominantly in posterior head regions, symmetric and reactive to eye opening and eye closing. EEG showed intermittent generalized 3 to 6 Hz theta-delta slowing. Triphasic waves, generalized were also noted.  Physiology photic driving was not seen during photic stimulation.  Hyperventilation was not performed.   ABNORMALITY -Intermittent slow, generalized -Triphasic waves, generalized IMPRESSION: This study is  suggestive of mild to moderate diffuse encephalopathy, nonspecific etiology but could be related to toxic-metabolic etiology. No seizures or definite epileptiform discharges were seen throughout the recording. Priyanka Barbra Sarks    Cardiac Studies   Echo IMPRESSIONS    1. Left ventricular ejection fraction, by estimation, is 50 to 55%. The  left ventricle has low normal function. The left ventricle has no regional  wall motion abnormalities. Left ventricular diastolic function could not  be evaluated.  2. Right ventricular systolic function is normal. The right ventricular  size is normal. There is normal pulmonary artery systolic pressure.  3. The mitral valve is normal in structure. No evidence of mitral valve  regurgitation. No evidence of mitral stenosis.  4. The aortic valve is normal in structure. Aortic valve  regurgitation is  not visualized. No aortic stenosis is present.   FINDINGS  Left Ventricle: Left ventricular ejection fraction, by estimation, is 50  to 55%. The left ventricle has low normal function. The left ventricle has  no regional wall motion abnormalities. The left ventricular internal  cavity size was normal in size.  There is no left ventricular hypertrophy. Left ventricular diastolic  function could not be evaluated due to atrial fibrillation. Left  ventricular diastolic function could not be evaluated.   Right Ventricle: The right ventricular size is normal. No increase in  right ventricular wall thickness. Right ventricular systolic function is  normal. There is normal pulmonary artery systolic pressure. The tricuspid  regurgitant velocity is 2.86 m/s, and  with an assumed right atrial pressure of 3 mmHg, the estimated right  ventricular systolic pressure is 27.0 mmHg.   Left Atrium: Left atrial size was normal in size.   Right Atrium: Right atrial size was normal in size.   Pericardium: There is no evidence of pericardial effusion.   Mitral Valve: The mitral valve is normal in structure. No evidence of  mitral valve regurgitation. No evidence of mitral valve stenosis.   Tricuspid Valve: The tricuspid valve is grossly normal. Tricuspid valve  regurgitation is mild . No evidence of tricuspid stenosis.   Aortic Valve: The aortic valve is normal in structure. Aortic valve  regurgitation is not visualized. No aortic stenosis is present.   Pulmonic Valve: The pulmonic valve was normal in structure. Pulmonic valve  regurgitation is not visualized. No evidence of pulmonic stenosis.   Aorta: The aortic root and ascending aorta are structurally normal, with  no evidence of dilitation.   IAS/Shunts: The atrial septum is grossly normal.   Patient Profile     79 y.o. female with atrial fib, COPD, frequent falls, admitted with pneumonia   Assessment & Plan    1.  Persistent atrial fibrillation: - feel PNA was initial cause, now w/ GI issues - improved HR with brief pauses at night. - BP stable with  Lopressor to TID, on dilt CD 120 mg  - on xarelto   2. PNA: Per IM - on ABX - w/ diarrhea, consider C diff test if she has any more>>IM to manage  3. HTN: - SBP up over the last 24 hr - minimal increase in BB  4. Tobacco use: - cessation reinforced -on nicoderm patch   5. Bradycardia - continue to follow on tele some pauses no dizziness   6.  Volume overload with post diuresis and lasix stopped for increase of Cr pt is neg 5190 since admit and wt down from 70.6 Kg to 65.6 Kg.  Cr today 1.26       For questions or updates, please contact [place] Please consult [email or web address] for contact info under        Signed, Cecilie Kicks, NP  05/17/2020, 8:55 AM    Attending Note:   The patient was seen and examined.  Agree with assessment and plan as noted above.  Changes made to the above note as needed.  Patient seen and independently examined with  Cecilie Kicks, NP .   We discussed all aspects of the encounter. I agree with the assessment and plan as stated above.  1.    Atrial fib:  Well controlled.  Cont cardizem DC 120 mg a day  Metoprolol 25 Q 8 hr. xarelto 15 mg a day   2.  Hyperlipidemia.   Cont . pravachol   3. Pneumonia :  Plans per IM team   4. DM :  Per IM team     I have spent a total of 40 minutes with patient reviewing hospital  notes , telemetry, EKGs, labs and examining patient as well as establishing an assessment and plan that was discussed with the patient. > 50% of time was spent in direct patient care.  [facility] will sign off.   Medication Recommendations:   Cont. Current meds.  Other recommendations (labs, testing, etc):   Follow up as an outpatient:   With Dr.  Stanford Breed or APP in several weeks.     Thayer Headings, Brooke Bonito., MD, [facility] 05/17/2020, 11:04 AM [address],  [employer]  [place] Pager [phone]

## 2020-05-17 NOTE — Progress Notes (Signed)
SATURATION QUALIFICATIONS: (This note is used to comply with regulatory documentation for home oxygen)  Patient Saturations on Room Air at Rest = 97%  Patient Saturations on Room Air while Ambulating = 80%  Patient Saturations on 2 Liters of oxygen while Ambulating = 95%  Please briefly explain why patient needs home oxygen: Decreased oxygen sat while ambulating

## 2020-05-17 NOTE — Care Management Important Message (Signed)
Important Message  Patient Details  Name: Kathleen Griffith MRN: [id number] Date of Birth: [birth year]   Medicare Important Message Given:  Yes     [name] 05/17/2020, 3:00 PM

## 2020-05-17 NOTE — Progress Notes (Signed)
PROGRESS NOTE  Kathleen Griffith GUY:[id number] DOB: 10/27/1941 DOA: 05/06/2020 PCP: No primary care provider on file.  HPI/Recap of past 24 hours: Kathleen Griffith is an 79 y.o. female with medical history significant ofPVD: PMR; HTN; HLD; DM with gastroparesis; fibromyalgia; and afib presenting with refractory PNA.She reports that she was doing fine. She flew to [place] to visit a friend. The day after returning home, she was in afib (hasn't been in that in over a year). Found to have pneumonia and has been slow to recover.     Assessment/Plan: Principal Problem:   CAP (community acquired pneumonia) Active Problems:   Tobacco abuse   Essential hypertension   Hyperlipidemia   Atrial fibrillation with RVR (HCC)   DNR (do not resuscitate)   Palliative care encounter   DM (diabetes mellitus) with complications ([place])   Multifocal CAP Currently afebrile, with resolved leukocytosis Currently saturating above 90% on room air Procalcitonin 0.95--> 0.12, downtrending Influenza, COVID-19 negative BC x2 NGTD Sputum culture consistent with normal respiratory flora CTA chest showed extensive right upper lobe airspace opacity Chest x-ray showed initially right upper lobe pneumonia, repeat currently shows improved multifocal pneumonia Last dose of cefepime on 05/16/20 for a 7 day total, completed 9 days of azithromycin Duo nebs, Pulmicort, supplemental O2 as needed Continuous pulse ox, telemetry  Persistent A. Fib/Noted pauses on telemetry Echo showed EF of 50 to 55%, no regional wall motion abnormality noted Cardiology on board, increased metoprolol to 25 mg twice daily, continue Cardizem, plan for possible cardioversion once pneumonia is treated as an outpt Continue Xarelto Outpatient cardiology follow up  ?? Fluid overload/acute on chronic systolic HF Improved BNP elevated 1,775 Echo showed EF of 50 to 55%, no regional wall motion abnormality Cardiology on board, s/p IV diuresis,  stopped due to rising Cr  N/V -resolved  Involuntary BLE jerking movement Unknown etiology Not uremic, electrolytes WNL  EEG unremarkable -outpatient follow up  Diabetes mellitus type 2 A1c 6.8 SSI, Accu-Cheks, hypoglycemic protocol  Hypertension Continue Lopressor, Cardizem  Hyperlipidemia Continue statins  Tobacco abuse Advised to quit Nicotine patch  [place] Patient is currently DNR    Malnutrition Type:  Nutrition Problem: Inadequate oral intake Etiology: decreased appetite   Malnutrition Characteristics:  Signs/Symptoms: meal completion < 50%   Nutrition Interventions:  Interventions: Glucerna shake, MVI, Magic cup    Estimated body mass index is 25.62 kg/m as calculated from the following:   Height as of this encounter: 5\' 3"  (1.6 m).   Weight as of this encounter: 65.6 kg.     Code Status: DNR   Disposition Plan: Status is: Inpatient  Remains inpatient appropriate because: needs safe d/c plan   Dispo: The patient is from: Home              Anticipated d/c is to: SNF              Anticipated d/c date is: 1 day on 5/20               Patient currently is medically stable but needs SNF placement   Consultants:  Cardiology    DVT prophylaxis: Xarelto   No further nausea  Objective: Vitals:   05/16/20 2107 05/17/20 0005 05/17/20 0606 05/17/20 0900  BP: (!) 114/56 (!) 116/50 109/74   Pulse: 88 94 86   Resp: 20 16 17    Temp: 98 F (36.7 C) 98 F (36.7 C) 97.9 F (36.6 C)   TempSrc: Oral Oral Oral   SpO2: 94%  95% 95% 99%  Weight:   65.6 kg   Height:        Intake/Output Summary (Last 24 hours) at 05/17/2020 1120 Last data filed at 05/17/2020 6945 Gross per 24 hour  Intake 240 ml  Output 300 ml  Net -60 ml   Filed Weights   05/15/20 0626 05/16/20 0500 05/17/20 0606  Weight: 68.2 kg 68 kg 65.6 kg    Exam:  General: Appearance:     Overweight female in no acute distress  Eyes:    PERRL, conjunctiva/corneas clear, EOM's  intact       Lungs:     Clear to auscultation bilaterally, respirations unlabored  Heart:    Normal heart rate. Irregularly irregular rhythm. No murmurs, rubs, or gallops.   MS:   All extremities are intact.   Neurologic:   Awake, alert, oriented x 3. No apparent focal neurological           defect.      Data Reviewed: CBC: Recent Labs  Lab 05/13/20 0237 05/14/20 0604 05/15/20 0158 05/16/20 0344 05/17/20 0224  WBC 12.1* 8.8 9.5 8.6 8.5  NEUTROABS 8.9* 6.2 6.1 5.1 5.3  HGB 10.7* 11.8* 11.7* 11.8* 11.9*  HCT 34.7* 38.1 37.8 37.9 38.1  MCV 91.8 92.5 91.5 90.9 91.6  PLT 369 352 329 338 038   Basic Metabolic Panel: Recent Labs  Lab 05/13/20 0237 05/14/20 0604 05/15/20 0158 05/15/20 1304 05/16/20 0344 05/17/20 0224  NA 137 138 137  --  137 137  K 4.2 4.3 4.2  --  4.3 4.2  CL 101 100 101  --  102 101  CO2 30 30 29   --  25 25  GLUCOSE 138* 167* 143*  --  140* 174*  BUN 13 14 15   --  17 21  CREATININE 1.19* 1.13* 1.19*  --  1.19* 1.26*  CALCIUM 8.2* 8.6* 8.8*  --  8.9 8.9  MG  --   --   --  1.8  --   --    GFR: Estimated Creatinine Clearance: 33 mL/min (A) (by C-G formula based on SCr of 1.26 mg/dL (H)). Liver Function Tests: No results for input(s): AST, ALT, ALKPHOS, BILITOT, PROT, ALBUMIN in the last 168 hours. No results for input(s): LIPASE, AMYLASE in the last 168 hours. No results for input(s): AMMONIA in the last 168 hours. Coagulation Profile: No results for input(s): INR, PROTIME in the last 168 hours. Cardiac Enzymes: No results for input(s): CKTOTAL, CKMB, CKMBINDEX, TROPONINI in the last 168 hours. BNP (last 3 results) No results for input(s): PROBNP in the last 8760 hours. HbA1C: No results for input(s): HGBA1C in the last 72 hours. CBG: Recent Labs  Lab 05/16/20 1146 05/16/20 1627 05/16/20 2119 05/17/20 0621 05/17/20 1110  GLUCAP 189* 113* 147* 134* 148*   Lipid Profile: No results for input(s): CHOL, HDL, LDLCALC, TRIG, CHOLHDL, LDLDIRECT in  the last 72 hours. Thyroid Function Tests: No results for input(s): TSH, T4TOTAL, FREET4, T3FREE, THYROIDAB in the last 72 hours. Anemia Panel: No results for input(s): VITAMINB12, FOLATE, FERRITIN, TIBC, IRON, RETICCTPCT in the last 72 hours. Urine analysis:    Component Value Date/Time   COLORURINE STRAW (A) 12/27/2019 2333   APPEARANCEUR CLEAR 12/27/2019 2333   LABSPEC 1.006 12/27/2019 2333   PHURINE 8.0 12/27/2019 2333   GLUCOSEU NEGATIVE 12/27/2019 2333   HGBUR NEGATIVE 12/27/2019 2333   BILIRUBINUR NEGATIVE 12/27/2019 [place] 12/27/2019 2333   PROTEINUR 30 (A) 12/27/2019 2333  NITRITE NEGATIVE 12/27/2019 2333   LEUKOCYTESUR TRACE (A) 12/27/2019 2333   Sepsis Labs: @LABRCNTIP (procalcitonin:4,lacticidven:4)  ) Recent Results (from the past 240 hour(s))  Expectorated sputum assessment w rflx to resp cult     Status: None   Collection Time: 05/09/20 12:48 PM   Specimen: Expectorated Sputum  Result Value Ref Range Status   Specimen Description Expect. Sput  Final   Special Requests NONE  Final   Sputum evaluation   Final    THIS SPECIMEN IS ACCEPTABLE FOR SPUTUM CULTURE Performed at [facility], [address], [place], [place] [address]    Report Status 05/09/2020 FINAL  Final  Culture, respiratory     Status: None   Collection Time: 05/09/20 12:48 PM  Result Value Ref Range Status   Specimen Description Expect. Sput  Final   Special Requests NONE Reflexed from [id number]  Final   Gram Stain   Final    RARE WBC PRESENT, PREDOMINANTLY PMN FEW SQUAMOUS EPITHELIAL CELLS PRESENT MODERATE GRAM NEGATIVE RODS FEW GRAM POSITIVE COCCI FEW GRAM POSITIVE RODS    Culture   Final    Consistent with normal respiratory flora. Performed at [facility], [place] [address], [place], [place] [address]    Report Status 05/11/2020 FINAL  Final  SARS CORONAVIRUS 2 (TAT 6-24 HRS) Nasopharyngeal Nasopharyngeal Swab     Status: None   Collection Time: 05/16/20   2:25 PM   Specimen: Nasopharyngeal Swab  Result Value Ref Range Status   SARS Coronavirus 2 NEGATIVE NEGATIVE Final    Comment: (NOTE) SARS-CoV-2 target nucleic acids are NOT DETECTED. The SARS-CoV-2 RNA is generally detectable in upper and lower respiratory specimens during the acute phase of infection. Negative results do not preclude SARS-CoV-2 infection, do not rule out co-infections with other pathogens, and should not be used as the sole basis for treatment or other patient management decisions. Negative results must be combined with clinical observations, patient history, and epidemiological information. The expected result is Negative. Fact Sheet for Patients: SugarRoll.be Fact Sheet for Healthcare Providers: https://www.woods-mathews.com/ This test is not yet approved or cleared by the [place] FDA and  has been authorized for detection and/or diagnosis of SARS-CoV-2 by FDA under an Emergency Use Authorization (EUA). This EUA will remain  in effect (meaning this test can be used) for the duration of the COVID-19 declaration under Section 56 4(b)(1) of the Act, 21 U.S.C. section 360bbb-3(b)(1), unless the authorization is terminated or revoked sooner. Performed at [facility], [place] [address] [place], [place] [address]       Studies: No results found.  Scheduled Meds: . acidophilus  1 capsule Oral Daily  . budesonide (PULMICORT) nebulizer solution  0.25 mg Nebulization BID  . diltiazem  120 mg Oral Daily  . feeding supplement (GLUCERNA SHAKE)  237 mL Oral TID BM  . gabapentin  100 mg Oral QHS  . insulin aspart  0-15 Units Subcutaneous TID WC  . insulin aspart  0-5 Units Subcutaneous QHS  . levalbuterol  0.63 mg Nebulization BID  . metoprolol tartrate  25 mg Oral Q8H  . multivitamin with minerals  1 tablet Oral Daily  . nicotine  14 mg Transdermal Daily  . pantoprazole  40 mg Oral BID  . pravastatin  20 mg Oral  QHS  . rivaroxaban  15 mg Oral QHS  . sertraline  50 mg Oral Daily  . sucralfate  1 g Oral TID WC & HS    Continuous  Infusions: . ceFEPime (MAXIPIME) IV 2 g (05/17/20 1026)     LOS: 11 days     Geradine Girt, DO [facility]  If 7PM-7AM, please contact night-coverage [email or web address] 05/17/2020, 11:20 AM

## 2020-05-17 NOTE — TOC Progression Note (Addendum)
Transition of Care [facility]) - Progression Note    Patient Details  Name: Kathleen Griffith MRN: [id number] Date of Birth: [birth year]  Transition [facility] CM/SW Contact  [name] [name], RN Phone Number: 05/17/2020, 11:40 AM  Clinical Narrative:     [name] unable to accept now. They have a positive covid case and Health Department will not allow them to admit.  Will have to re do authorization for SNF.  Provided patient with all other offers. Patient aware and voiced understanding.  Patient will talk with son , she believes he is off work at 2 pm today. NCM will check back with patient this afternoon for decision on SNF bed. Insurance will need SNF decision today.     Expected Discharge Plan: [place] Barriers to Discharge: Continued Medical Work up  Expected Discharge Plan and Services Expected Discharge Plan: [place]   Discharge Planning Services: CM Consult Post Acute Care Choice: [place] Living arrangements for the past 2 months: Apartment Expected Discharge Date: 05/18/20               DME Arranged: N/A DME Agency: NA       HH Arranged: NA HH Agency: [place] Date [facility] Agency Contacted: 05/09/20 Time [place]: [id number] Representative spoke with at [place]: Tommi Rumps awaiting call back   Social Determinants of Health (SDOH) Interventions    Readmission Risk Interventions No flowsheet data found.

## 2020-05-17 NOTE — Discharge Summary (Signed)
Physician Discharge Summary  Kathleen Griffith XBM:[id number] DOB: [birth year] DOA: 05/06/2020  PCP: No primary care provider on file.  Admit date: 05/06/2020 Discharge date: 05/17/2020  Admitted From: home Discharge disposition: home with family   Recommendations for Outpatient Follow-Up:   1. Cbc, BMP 1 week 2. Close follow up with cards 3. Home health (patient and family declined SNF) 4. Wean O2 to RA as tolerated 5. Needs outpatient chest x ray in 4 weeks to ensure resolution of pna   Discharge Diagnosis:   Principal Problem:   CAP (community acquired pneumonia) Active Problems:   Tobacco abuse   Essential hypertension   Hyperlipidemia   Atrial fibrillation with RVR (HCC)   DNR (do not resuscitate)   Palliative care encounter   DM (diabetes mellitus) with complications ([place])    Discharge Condition: Improved.  Diet recommendation: Low sodium, heart healthy.  Carbohydrate-modified.  Wound care: None.  Code status: dnr   History of Present Illness:   Kathleen Griffith is a 79 y.o. female with medical history significant of PVD: PMR; HTN; HLD; DM with gastroparesis; fibromyalgia; and afib presenting with refractory PNA.  She reports that she was doing fine.  She flew to [place] to visit a friend.  The day after returning home, she was in afib (hasn't been in that in over a year).  She fell the night before she came home for the first time in over a year.  Things just went from bad to worse.  She fell at home 5 days later.  She has no energy, takes everything she can do to do anything.  She gets up on the side of the chair or bed and has to sit there for an hour or more to get the energy to get up.  She can't fix meals.  She drinks water.  Yesterday, she ate one piece of toast all day.  Not really SOB, only with conversation or moving around.  +cough, productive of greenish sputum.  No fever.  She saw Dr. Coletta Memos on 5/5 and he sent her for CXR and ankle xray (from fall); he  told her he thought she had atypical PNA and called in azithromycin.  She has had minimal improvement despite this being the third day.     Hospital Course by Problem:   Multifocal CAP Currently afebrile, with resolved leukocytosis Currently saturating above 90% on room air Procalcitonin 0.95--> 0.12, downtrending Influenza, COVID-19 negative BC x2 NGTD Sputum culture consistent with normal respiratory flora CTA chest showed extensive right upper lobe airspace opacity Chest x-ray showed initially right upper lobe pneumonia, repeat currently shows improved multifocal pneumonia Last dose of cefepime on 05/16/20 for a 7 day total, completed 9 days of azithromycin Duo nebs, Pulmicort, supplemental O2 as needed  Persistent A. Fib/Noted pauses on telemetry Echo showed EF of 50 to 55%, no regional wall motion abnormality noted Cardiology on board, increased metoprolol to 25 mg twice daily, continue Cardizem, plan for possible cardioversion once pneumonia is treated as an outpt Continue Xarelto Outpatient cardiology follow up   Fluid overload/acute on chronic diastolic HF Improved BNP elevated 1,775 Echo showed EF of 50 to 55%, no regional wall motion abnormality Cardiology on board, s/p IV diuresis, stopped due to rising Cr  N/V -resolved  Involuntary BLE jerking movement Unknown etiology Not uremic, electrolytes WNL  EEG unremarkable -outpatient follow up  Diabetes mellitus type 2 A1c 6.8 SSI, Accu-Cheks, hypoglycemic protocol  Hypertension Continue Lopressor, Cardizem  Hyperlipidemia Continue statins  Tobacco abuse Advised to quit Nicotine patch  [name] Patient is currently DNR    Malnutrition Type:  Nutrition Problem: Inadequate oral intake Etiology: decreased appetite   Malnutrition Characteristics:  Signs/Symptoms: meal completion < 50%   Nutrition Interventions:  Interventions: Glucerna shake, MVI, Magic cup    Estimated body  mass index is 25.62 kg/m as calculated from the following:   Height as of this encounter: 5\' 3"  (1.6 m).   Weight as of this encounter: 65.6 kg.        Medical Consultants:   cards   Discharge Exam:   Vitals:   05/17/20 0900 05/17/20 1303  BP:  (!) 106/52  Pulse:  85  Resp:  16  Temp:  97.9 F (36.6 C)  SpO2: 99% 95%   Vitals:   05/17/20 0005 05/17/20 0606 05/17/20 0900 05/17/20 1303  BP: (!) 116/50 109/74  (!) 106/52  Pulse: 94 86  85  Resp: 16 17  16   Temp: 98 F (36.7 C) 97.9 F (36.6 C)  97.9 F (36.6 C)  TempSrc: Oral Oral  Oral  SpO2: 95% 95% 99% 95%  Weight:  65.6 kg    Height:        General exam: Appears calm and comfortable.  The results of significant diagnostics from this hospitalization (including imaging, microbiology, ancillary and laboratory) are listed below for reference.     Procedures and Diagnostic Studies:   CT ANGIO CHEST PE W OR WO CONTRAST  Result Date: 05/06/2020 CLINICAL DATA:  Shortness of breath. Recent pneumonia diagnosis. EXAM: CT ANGIOGRAPHY CHEST WITH CONTRAST TECHNIQUE: Multidetector CT imaging of the chest was performed using the standard protocol during bolus administration of intravenous contrast. Multiplanar CT image reconstructions and MIPs were obtained to evaluate the vascular anatomy. CONTRAST:  43mL OMNIPAQUE IOHEXOL 350 MG/ML SOLN COMPARISON:  03/07/2016 FINDINGS: Cardiovascular: No pericardial effusion. Mild biatrial enlargement. The RV is nondilated. Borderline enlargement of central pulmonary arteries. Satisfactory opacification of pulmonary arteries noted, and there is no evidence of pulmonary emboli. Mitral annulus calcifications. Moderate coronary calcifications. Adequate contrast opacification of the thoracic aorta with no evidence of dissection, aneurysm, or stenosis. There is classic 3-vessel brachiocephalic arch anatomy without proximal stenosis. Moderate scattered calcified plaque through the thoracic aorta.  Visualized proximal abdominal aorta is atheromatous, nondilated. Mediastinum/Nodes: Partially calcified 1.7 cm subcarinal node. Enlarged right paratracheal nodes up to 1.4 cm. Subcentimeter prevascular and AP window lymph nodes. Partially calcified subcentimeter left hilar lymph nodes Lungs/Pleura: Trace right pleural effusion. No pneumothorax. Extensive airspace opacities throughout superior and posterior segments right upper lobe. Scattered somewhat geographic ground-glass opacities in both lower lobes. Calcified granuloma in the left lower lobe. Secretions partially obstructing right lower lobe segmental bronchi. Upper Abdomen: Large right renal cyst, incompletely visualized. Partially calcified subcentimeter stones in the dependent aspect of the nondilated gallbladder fundus. No acute findings. Musculoskeletal: Anterior vertebral endplate spurring at multiple levels in the mid and lower thoracic spine. Review of the MIP images confirms the above findings. IMPRESSION: 1. Negative for acute PE or thoracic aortic dissection. 2. Extensive right upper lobe airspace opacities suggesting pneumonia. Followup PA and lateral chest X-ray is recommended in 3-4 weeks following trial of antibiotic therapy to ensure resolution and exclude underlying malignancy. 3. Trace right pleural effusion. 4. Cholelithiasis. Aortic Atherosclerosis (ICD10-I70.0). Electronically Signed   By: Lucrezia Europe M.D.   On: 05/06/2020 14:28   DG Chest Port 1 View  Result Date: 05/06/2020 CLINICAL DATA:  Pneumonia EXAM: PORTABLE CHEST 1 VIEW COMPARISON:  September 25, 2018 FINDINGS: There is airspace consolidation in the right upper lobe. There is a calcified granuloma in the left lower lung region. Lungs otherwise are clear. Heart is upper normal in size with pulmonary vascularity normal. No adenopathy. There is aortic atherosclerosis. No bone lesions. IMPRESSION: Airspace consolidation right upper lobe consistent with pneumonia. Lungs otherwise  clear except for small left lower lobe region calcified granuloma. Heart upper normal in size. No demonstrable adenopathy by radiography. Aortic Atherosclerosis (ICD10-I70.0). Electronically Signed   By: Lowella Grip III M.D.   On: 05/06/2020 10:49   ECHOCARDIOGRAM COMPLETE  Result Date: 05/06/2020    ECHOCARDIOGRAM REPORT   Patient Name:   Kathleen Griffith Date of Exam: 05/06/2020 Medical Rec #:  [id number]       Height:       63.0 in Accession #:    [id number]      Weight:       157.2 lb Date of Birth:  January 26, 1941       BSA:          1.745 m Patient Age:    37 years        BP:           151/76 mmHg Patient Gender: F               HR:           97 bpm. Exam Location:  Inpatient Procedure: 2D Echo Indications:    dyspnea 786.09  History:        Patient has prior history of Echocardiogram examinations, most                 recent 08/12/2017. Arrythmias:Atrial Fibrillation; Risk                 Factors:Hypertension, Dyslipidemia and Current Smoker.  Sonographer:    Johny Chess Referring Phys: [place]  1. Left ventricular ejection fraction, by estimation, is 50 to 55%. The left ventricle has low normal function. The left ventricle has no regional wall motion abnormalities. Left ventricular diastolic function could not be evaluated.  2. Right ventricular systolic function is normal. The right ventricular size is normal. There is normal pulmonary artery systolic pressure.  3. The mitral valve is normal in structure. No evidence of mitral valve regurgitation. No evidence of mitral stenosis.  4. The aortic valve is normal in structure. Aortic valve regurgitation is not visualized. No aortic stenosis is present. FINDINGS  Left Ventricle: Left ventricular ejection fraction, by estimation, is 50 to 55%. The left ventricle has low normal function. The left ventricle has no regional wall motion abnormalities. The left ventricular internal cavity size was normal in size. There is no left  ventricular hypertrophy. Left ventricular diastolic function could not be evaluated due to atrial fibrillation. Left ventricular diastolic function could not be evaluated. Right Ventricle: The right ventricular size is normal. No increase in right ventricular wall thickness. Right ventricular systolic function is normal. There is normal pulmonary artery systolic pressure. The tricuspid regurgitant velocity is 2.86 m/s, and  with an assumed right atrial pressure of 3 mmHg, the estimated right ventricular systolic pressure is 78.2 mmHg. Left Atrium: Left atrial size was normal in size. Right Atrium: Right atrial size was normal in size. Pericardium: There is no evidence of pericardial effusion. Mitral Valve: The mitral valve is normal in structure. No evidence of mitral valve regurgitation. No evidence of mitral valve stenosis. Tricuspid Valve: The tricuspid valve is grossly  normal. Tricuspid valve regurgitation is mild . No evidence of tricuspid stenosis. Aortic Valve: The aortic valve is normal in structure. Aortic valve regurgitation is not visualized. No aortic stenosis is present. Pulmonic Valve: The pulmonic valve was normal in structure. Pulmonic valve regurgitation is not visualized. No evidence of pulmonic stenosis. Aorta: The aortic root and ascending aorta are structurally normal, with no evidence of dilitation. IAS/Shunts: The atrial septum is grossly normal.  LEFT VENTRICLE PLAX 2D LVIDd:         3.80 cm LVIDs:         2.80 cm LV PW:         1.00 cm LV IVS:        1.00 cm LVOT diam:     2.00 cm LVOT Area:     3.14 cm  RIGHT VENTRICLE             IVC RV S prime:     13.70 cm/s  IVC diam: 1.80 cm LEFT ATRIUM             Index       RIGHT ATRIUM           Index LA diam:        3.50 cm 2.01 cm/m  RA Area:     14.80 cm LA Vol (A2C):   42.1 ml 24.12 ml/m RA Volume:   34.30 ml  19.65 ml/m LA Vol (A4C):   40.3 ml 23.09 ml/m LA Biplane Vol: 44.0 ml 25.21 ml/m   AORTA Ao Root diam: 2.90 cm TRICUSPID VALVE TR  Peak grad:   32.7 mmHg TR Vmax:        286.00 cm/s  SHUNTS Systemic Diam: 2.00 cm Mertie Moores MD Electronically signed by Mertie Moores MD Signature Date/Time: 05/06/2020/5:20:02 PM    Final      Labs:   Basic Metabolic Panel: Recent Labs  Lab 05/13/20 0237 05/13/20 0237 05/14/20 0604 05/14/20 0604 05/15/20 0158 05/15/20 0158 05/15/20 1304 05/16/20 0344 05/17/20 0224  NA 137  --  138  --  137  --   --  137 137  K 4.2   < > 4.3   < > 4.2   < >  --  4.3 4.2  CL 101  --  100  --  101  --   --  102 101  CO2 30  --  30  --  29  --   --  25 25  GLUCOSE 138*  --  167*  --  143*  --   --  140* 174*  BUN 13  --  14  --  15  --   --  17 21  CREATININE 1.19*  --  1.13*  --  1.19*  --   --  1.19* 1.26*  CALCIUM 8.2*  --  8.6*  --  8.8*  --   --  8.9 8.9  MG  --   --   --   --   --   --  1.8  --   --    < > = values in this interval not displayed.   GFR Estimated Creatinine Clearance: 33 mL/min (A) (by C-G formula based on SCr of 1.26 mg/dL (H)). Liver Function Tests: No results for input(s): AST, ALT, ALKPHOS, BILITOT, PROT, ALBUMIN in the last 168 hours. No results for input(s): LIPASE, AMYLASE in the last 168 hours. No results for input(s): AMMONIA in the last 168 hours. Coagulation profile No results  for input(s): INR, PROTIME in the last 168 hours.  CBC: Recent Labs  Lab 05/13/20 0237 05/14/20 0604 05/15/20 0158 05/16/20 0344 05/17/20 0224  WBC 12.1* 8.8 9.5 8.6 8.5  NEUTROABS 8.9* 6.2 6.1 5.1 5.3  HGB 10.7* 11.8* 11.7* 11.8* 11.9*  HCT 34.7* 38.1 37.8 37.9 38.1  MCV 91.8 92.5 91.5 90.9 91.6  PLT 369 352 329 338 344   Cardiac Enzymes: No results for input(s): CKTOTAL, CKMB, CKMBINDEX, TROPONINI in the last 168 hours. BNP: Invalid input(s): POCBNP CBG: Recent Labs  Lab 05/16/20 1146 05/16/20 1627 05/16/20 2119 05/17/20 0621 05/17/20 1110  GLUCAP 189* 113* 147* 134* 148*   D-Dimer No results for input(s): DDIMER in the last 72 hours. Hgb A1c No results for  input(s): HGBA1C in the last 72 hours. Lipid Profile No results for input(s): CHOL, HDL, LDLCALC, TRIG, CHOLHDL, LDLDIRECT in the last 72 hours. Thyroid function studies No results for input(s): TSH, T4TOTAL, T3FREE, THYROIDAB in the last 72 hours.  Invalid input(s): FREET3 Anemia work up No results for input(s): VITAMINB12, FOLATE, FERRITIN, TIBC, IRON, RETICCTPCT in the last 72 hours. Microbiology Recent Results (from the past 240 hour(s))  Expectorated sputum assessment w rflx to resp cult     Status: None   Collection Time: 05/09/20 12:48 PM   Specimen: Expectorated Sputum  Result Value Ref Range Status   Specimen Description Expect. Sput  Final   Special Requests NONE  Final   Sputum evaluation   Final    THIS SPECIMEN IS ACCEPTABLE FOR SPUTUM CULTURE Performed at [facility], [address], [place], [place] [address]    Report Status 05/09/2020 FINAL  Final  Culture, respiratory     Status: None   Collection Time: 05/09/20 12:48 PM  Result Value Ref Range Status   Specimen Description Expect. Sput  Final   Special Requests NONE Reflexed from [id number]  Final   Gram Stain   Final    RARE WBC PRESENT, PREDOMINANTLY PMN FEW SQUAMOUS EPITHELIAL CELLS PRESENT MODERATE GRAM NEGATIVE RODS FEW GRAM POSITIVE COCCI FEW GRAM POSITIVE RODS    Culture   Final    Consistent with normal respiratory flora. Performed at [facility], [place] [address] [place], [place] [address]    Report Status 05/11/2020 FINAL  Final  SARS CORONAVIRUS 2 (TAT 6-24 HRS) Nasopharyngeal Nasopharyngeal Swab     Status: None   Collection Time: 05/16/20  2:25 PM   Specimen: Nasopharyngeal Swab  Result Value Ref Range Status   SARS Coronavirus 2 NEGATIVE NEGATIVE Final    Comment: (NOTE) SARS-CoV-2 target nucleic acids are NOT DETECTED. The SARS-CoV-2 RNA is generally detectable in upper and lower respiratory specimens during the acute phase of infection. Negative results do not preclude  SARS-CoV-2 infection, do not rule out co-infections with other pathogens, and should not be used as the sole basis for treatment or other patient management decisions. Negative results must be combined with clinical observations, patient history, and epidemiological information. The expected result is Negative. Fact Sheet for Patients: SugarRoll.be Fact Sheet for Healthcare Providers: https://www.woods-mathews.com/ This test is not yet approved or cleared by the [place] FDA and  has been authorized for detection and/or diagnosis of SARS-CoV-2 by FDA under an Emergency Use Authorization (EUA). This EUA will remain  in effect (meaning this test can be used) for the duration of the COVID-19 declaration under Section 56 4(b)(1) of the Act, 21 U.S.C. section 360bbb-3(b)(1), unless the authorization is terminated or revoked sooner.  Performed at [facility], [place] [address], [place], [place] [address]      Discharge Instructions:   Discharge Instructions    Diet - low sodium heart healthy   Complete by: As directed    Diet Carb Modified   Complete by: As directed    Discharge instructions   Complete by: As directed    Can use any probiotics that you have (does not have to be lactobacillus)  BMP 1 week Will need to adjust insulin based on how much you are eating   Increase activity slowly   Complete by: As directed      Allergies as of 05/17/2020      Reactions   Anoro Ellipta [umeclidinium-vilanterol] Palpitations   Losartan Shortness Of Breath   wheezing wheezing   Norvasc [amlodipine Besylate] Swelling   Pregabalin Swelling   TO LOWER EXTREMTIES TO LOWER EXTREMTIES   Bupropion    Other reaction(s): Other Sucidal thoughts   Eliquis [apixaban] Nausea And Vomiting   Severe stomach pain   Metoclopramide Other (See Comments)   Tardive dyskinesia, tremors   Lisinopril Cough   Metformin Diarrhea   Metformin And Related  Diarrhea      Medication List    STOP taking these medications   naproxen sodium 220 MG tablet Commonly known as: ALEVE     TAKE these medications   acetaminophen 650 MG CR tablet Commonly known as: TYLENOL Take 1,300 mg by mouth every 8 (eight) hours as needed for pain.   acidophilus Caps capsule Take 1 capsule by mouth daily. Start taking on: May 18, 2020   albuterol 108 (90 Base) MCG/ACT inhaler Commonly known as: VENTOLIN HFA Inhale 2 puffs into the lungs every 4 (four) hours as needed.   alendronate 70 MG tablet Commonly known as: FOSAMAX Take 70 mg by mouth once a week.   B-D UF III MINI PEN NEEDLES 31G X 5 MM Misc Generic drug: Insulin Pen Needle USE WITH LANTUS EVERY NIGHT AT BEDTIME   budesonide 0.25 MG/2ML nebulizer solution Commonly known as: PULMICORT Take 2 mLs (0.25 mg total) by nebulization 2 (two) times daily.   CENTRUM SILVER 50+WOMEN PO Take 1 tablet by mouth daily.   diltiazem 120 MG 24 hr capsule Commonly known as: CARDIZEM CD Take 1 capsule (120 mg total) by mouth daily. Start taking on: May 18, 2020   gabapentin 100 MG capsule Commonly known as: NEURONTIN Take 1 capsule (100 mg total) by mouth at bedtime.   Lantus SoloStar 100 UNIT/ML Solostar Pen Generic drug: insulin glargine Inject 20 Units into the skin daily. What changed:   how much to take  how to take this  when to take this  additional instructions   levalbuterol 0.63 MG/3ML nebulizer solution Commonly known as: XOPENEX Take 3 mLs (0.63 mg total) by nebulization 2 (two) times daily.   metoprolol tartrate 25 MG tablet Commonly known as: LOPRESSOR Take 1 tablet (25 mg total) by mouth every 8 (eight) hours. What changed: when to take this   nicotine 14 mg/24hr patch Commonly known as: NICODERM CQ - dosed in mg/24 hours Place 1 patch (14 mg total) onto the skin daily. Start taking on: May 18, 2020   nitroGLYCERIN 0.3 MG SL tablet Commonly known as: NITROSTAT Place  1 tablet (0.3 mg total) under the tongue every 5 (five) minutes as needed for chest pain.   omeprazole 20 MG capsule Commonly known as: PRILOSEC Take 2 capsules (40 mg total) by mouth 2 (  two) times daily. KEEP 06/01/20 OFFICE VISIT FOR FURTHER REFILLS What changed: additional instructions   OneTouch Ultra test strip Generic drug: glucose blood Use to test blood sugars 2 times daily.   pravastatin 20 MG tablet Commonly known as: PRAVACHOL Take 20 mg by mouth at bedtime.   sertraline 100 MG tablet Commonly known as: ZOLOFT Take 50 mg by mouth daily.   sucralfate 1 g tablet Commonly known as: Carafate Take 1 tablet (1 g total) by mouth 4 (four) times daily -  with meals and at bedtime.   VITAMIN D PO Take 1 capsule by mouth daily.   Xarelto 20 MG Tabs tablet Generic drug: rivaroxaban Take 20 mg by mouth at bedtime.            Durable Medical Equipment  (From admission, onward)         Start     Ordered   05/17/20 1458  For home use only DME Nebulizer machine  Once    Question Answer Comment  Patient needs a nebulizer to treat with the following condition COPD (chronic obstructive pulmonary disease) ([place])   Length of Need Lifetime      05/17/20 1457         Follow-up Information    Lelon Perla, MD Follow up in 1 week(s).   Specialty: Cardiology Contact information: [address] [place] [address] [place], [facility] Follow up.   Specialty: Home Health Services Contact information: [place] [address] [phone]            Time coordinating discharge: 35 min  Signed:  Geradine Girt DO  [facility] 05/17/2020, 3:05 PM

## 2020-05-18 ENCOUNTER — Telehealth: Payer: Self-pay | Admitting: Cardiology

## 2020-05-18 NOTE — Telephone Encounter (Signed)
Patient returned call. Call successfully transferred to [facility].

## 2020-05-18 NOTE — Telephone Encounter (Signed)
Lm to call back ./cy 

## 2020-05-18 NOTE — Telephone Encounter (Signed)
Per pt feels like she has some confusion Encouraged pt to keep upcoming appt  With Dr Stanford Breed and also to have children check on her this evening Pt agrees with plan and son was on his way to pt's home .Adonis Housekeeper

## 2020-05-18 NOTE — Telephone Encounter (Signed)
New Message  Patient is calling in to see if she needs to keep her appointment that is scheduled for 05/22/20 with Dr. Stanford Breed. States that she has just out out of the hospital on 05/17/20. Please give patient a call back to advise.

## 2020-05-22 ENCOUNTER — Ambulatory Visit (INDEPENDENT_AMBULATORY_CARE_PROVIDER_SITE_OTHER): Payer: Medicare HMO | Admitting: Cardiology

## 2020-05-22 ENCOUNTER — Other Ambulatory Visit: Payer: Self-pay

## 2020-05-22 ENCOUNTER — Telehealth: Payer: Self-pay | Admitting: Cardiology

## 2020-05-22 ENCOUNTER — Other Ambulatory Visit: Payer: Self-pay | Admitting: *Deleted

## 2020-05-22 ENCOUNTER — Encounter: Payer: Self-pay | Admitting: Cardiology

## 2020-05-22 VITALS — BP 96/64 | HR 87 | Ht 63.0 in

## 2020-05-22 DIAGNOSIS — I4819 Other persistent atrial fibrillation: Secondary | ICD-10-CM

## 2020-05-22 DIAGNOSIS — I679 Cerebrovascular disease, unspecified: Secondary | ICD-10-CM

## 2020-05-22 DIAGNOSIS — I10 Essential (primary) hypertension: Secondary | ICD-10-CM | POA: Diagnosis not present

## 2020-05-22 DIAGNOSIS — E7800 Pure hypercholesterolemia, unspecified: Secondary | ICD-10-CM

## 2020-05-22 MED ORDER — METOPROLOL TARTRATE 25 MG PO TABS: 25.0000 mg | ORAL_TABLET | Freq: Two times a day (BID) | ORAL | 3 refills | Status: DC

## 2020-05-22 NOTE — Telephone Encounter (Signed)
New Message    Pt is calling and says her son will need to assist her to her appt because she is in a wheel chair and uses oxygen     Please advise

## 2020-05-22 NOTE — Telephone Encounter (Signed)
Pt called in requesting that her son assist her at her appt today with Dr. Stanford Breed because she is in a wheelchair and uses oxygen. I left the patient a message letting her know that her son could come to appt with her.

## 2020-05-22 NOTE — Patient Instructions (Signed)
Medication Instructions:   DECREASE METOPROLOL TO 25 MG TWICE DAILY  *If you need a refill on your cardiac medications before your next appointment, please call your pharmacy*   Lab Work: If you have labs (blood work) drawn today and your tests are completely normal, you will receive your results only by: Marland Kitchen MyChart Message (if you have MyChart) OR . A paper copy in the mail If you have any lab test that is abnormal or we need to change your treatment, we will call you to review the results.   Testing/Procedures:  You are scheduled for a Cardioversion on Thursday 06-01-20 with Dr. Stanford Breed.  Please arrive at the [facility] (Main Entrance A) at [facility]: [address] [place], [place] [address] at 12 pm. (1 hour prior to procedure unless lab work is needed; if lab work is needed arrive 1.5 hours ahead)  DIET: Nothing to eat or drink after midnight except a sip of water with medications (see medication instructions below)  Medication Instructions: DO NOT TAKE METOPROLOL Wednesday EVENING OR Thursday MORNING  Continue your anticoagulant: XARELTO  GO TO [place] ON Tuesday 05/30/20 @ 1:45 PM FOR COVID TESTING  You must have a responsible person to drive you home and stay in the waiting area during your procedure. Failure to do so could result in cancellation.  Bring your insurance cards.  *Special Note: Every effort is made to have your procedure done on time. Occasionally there are emergencies that occur at the hospital that may cause delays. Please be patient if a delay does occur.      Follow-Up: At [facility], you and your health needs are our priority.  As part of our continuing mission to provide you with exceptional heart care, we have created designated Provider Care Teams.  These Care Teams include your primary Cardiologist (physician) and Advanced Practice Providers (APPs -  Physician Assistants and Nurse Practitioners) who all work together to provide  you with the care you need, when you need it.  We recommend signing up for the patient portal called "MyChart".  Sign up information is provided on this After Visit Summary.  MyChart is used to connect with patients for Virtual Visits (Telemedicine).  Patients are able to view lab/test results, encounter notes, upcoming appointments, etc.  Non-urgent messages can be sent to your provider as well.   To learn more about what you can do with MyChart, go to [email or web address].    Your next appointment:   3 month(s)  The format for your next appointment:   In Person  Provider:   Kirk Ruths, MD

## 2020-05-22 NOTE — Telephone Encounter (Signed)
Accessed pt's chart to see who called her. Advised the pt that Kathleen Griffith had called to confirm her son can attend her appointment with her today.

## 2020-05-25 ENCOUNTER — Telehealth: Payer: Self-pay | Admitting: Cardiology

## 2020-05-25 ENCOUNTER — Telehealth: Payer: Self-pay | Admitting: Nurse Practitioner

## 2020-05-25 NOTE — Telephone Encounter (Signed)
Pt wishes to speak with Fredia Beets RN will forward message to Hilda Blades for return call tom 5/28 ./cy

## 2020-05-25 NOTE — Telephone Encounter (Signed)
New Message  Patient is calling in with questions about the Cardioversion that she has scheduled next week. Patient states that she would like to speak with Kathleen Griffith and only Kathleen Griffith about this. Informed patient that Kathleen Griffith is off today, but will be in tomorrow and can call back possibly at that time. Please give patient a call back.

## 2020-05-25 NOTE — Telephone Encounter (Signed)
Kathleen Griffith,  Patient was supposed to see Dr. Havery Griffith a few months ago, it does not look like she had the appointment??  At this point she should stay on all of her medication until she sees him in clinic.  Please make sure that she does have a scheduled follow-up with him.  Thank you

## 2020-05-26 NOTE — Telephone Encounter (Signed)
Patient advised that she should continue current medications per Nevin Bloodgood, NP until seen by Dr Havery Moros.  Patient states she has been in the hospital recently and is having cardioversion on 06-01-20.  She prefers to wait until July to be seen by Dr Havery Moros.  Patient scheduled for 07-25-20 at 2:10pm.  Patient agreed to plan and verbalized understanding.  No further questions.

## 2020-05-26 NOTE — Telephone Encounter (Signed)
Left message for pt to call.

## 2020-05-30 ENCOUNTER — Other Ambulatory Visit (HOSPITAL_COMMUNITY)
Admission: RE | Admit: 2020-05-30 | Discharge: 2020-05-30 | Disposition: A | Discharge status: Home / Self Care | Payer: Medicare HMO | Source: Ambulatory Visit | Attending: Cardiology | Admitting: Cardiology

## 2020-05-30 DIAGNOSIS — Z20822 Contact with and (suspected) exposure to covid-19: Secondary | ICD-10-CM | POA: Insufficient documentation

## 2020-05-30 DIAGNOSIS — Z01812 Encounter for preprocedural laboratory examination: Secondary | ICD-10-CM | POA: Insufficient documentation

## 2020-05-30 NOTE — Telephone Encounter (Signed)
Spoke with pt, questions regarding cardioversion answered.

## 2020-05-30 NOTE — Telephone Encounter (Signed)
Left message for pt to call.

## 2020-05-31 ENCOUNTER — Telehealth: Payer: Self-pay | Admitting: Nurse Practitioner

## 2020-05-31 LAB — SARS CORONAVIRUS 2 (TAT 6-24 HRS): SARS Coronavirus 2: NEGATIVE

## 2020-06-01 ENCOUNTER — Ambulatory Visit: Payer: Self-pay | Admitting: Gastroenterology

## 2020-06-01 ENCOUNTER — Ambulatory Visit (HOSPITAL_COMMUNITY): Payer: Medicare HMO | Admitting: Certified Registered Nurse Anesthetist

## 2020-06-01 ENCOUNTER — Encounter (HOSPITAL_COMMUNITY)
Admission: RE | Disposition: A | Discharge status: Home / Self Care | Payer: Self-pay | Source: Home / Self Care | Attending: Cardiology

## 2020-06-01 ENCOUNTER — Ambulatory Visit (HOSPITAL_COMMUNITY)
Admission: RE | Admit: 2020-06-01 | Discharge: 2020-06-01 | Disposition: A | Discharge status: Home / Self Care | Payer: Medicare HMO | Attending: Cardiology | Admitting: Cardiology

## 2020-06-01 ENCOUNTER — Encounter (HOSPITAL_COMMUNITY): Payer: Self-pay | Admitting: Cardiology

## 2020-06-01 ENCOUNTER — Other Ambulatory Visit: Payer: Self-pay

## 2020-06-01 DIAGNOSIS — Z7901 Long term (current) use of anticoagulants: Secondary | ICD-10-CM | POA: Insufficient documentation

## 2020-06-01 DIAGNOSIS — I10 Essential (primary) hypertension: Secondary | ICD-10-CM | POA: Diagnosis not present

## 2020-06-01 DIAGNOSIS — Z79899 Other long term (current) drug therapy: Secondary | ICD-10-CM | POA: Insufficient documentation

## 2020-06-01 DIAGNOSIS — Z8249 Family history of ischemic heart disease and other diseases of the circulatory system: Secondary | ICD-10-CM | POA: Diagnosis not present

## 2020-06-01 DIAGNOSIS — E1143 Type 2 diabetes mellitus with diabetic autonomic (poly)neuropathy: Secondary | ICD-10-CM | POA: Diagnosis not present

## 2020-06-01 DIAGNOSIS — I252 Old myocardial infarction: Secondary | ICD-10-CM | POA: Insufficient documentation

## 2020-06-01 DIAGNOSIS — I4819 Other persistent atrial fibrillation: Secondary | ICD-10-CM | POA: Insufficient documentation

## 2020-06-01 DIAGNOSIS — M797 Fibromyalgia: Secondary | ICD-10-CM | POA: Diagnosis not present

## 2020-06-01 DIAGNOSIS — M1990 Unspecified osteoarthritis, unspecified site: Secondary | ICD-10-CM | POA: Diagnosis not present

## 2020-06-01 DIAGNOSIS — Z833 Family history of diabetes mellitus: Secondary | ICD-10-CM | POA: Diagnosis not present

## 2020-06-01 DIAGNOSIS — E785 Hyperlipidemia, unspecified: Secondary | ICD-10-CM | POA: Diagnosis not present

## 2020-06-01 DIAGNOSIS — Z8679 Personal history of other diseases of the circulatory system: Secondary | ICD-10-CM | POA: Insufficient documentation

## 2020-06-01 DIAGNOSIS — Z7983 Long term (current) use of bisphosphonates: Secondary | ICD-10-CM | POA: Insufficient documentation

## 2020-06-01 DIAGNOSIS — M353 Polymyalgia rheumatica: Secondary | ICD-10-CM | POA: Insufficient documentation

## 2020-06-01 DIAGNOSIS — Z87891 Personal history of nicotine dependence: Secondary | ICD-10-CM | POA: Diagnosis not present

## 2020-06-01 DIAGNOSIS — Z794 Long term (current) use of insulin: Secondary | ICD-10-CM | POA: Diagnosis not present

## 2020-06-01 DIAGNOSIS — E1136 Type 2 diabetes mellitus with diabetic cataract: Secondary | ICD-10-CM | POA: Diagnosis not present

## 2020-06-01 DIAGNOSIS — K3184 Gastroparesis: Secondary | ICD-10-CM | POA: Insufficient documentation

## 2020-06-01 DIAGNOSIS — E1151 Type 2 diabetes mellitus with diabetic peripheral angiopathy without gangrene: Secondary | ICD-10-CM | POA: Diagnosis not present

## 2020-06-01 DIAGNOSIS — Z7951 Long term (current) use of inhaled steroids: Secondary | ICD-10-CM | POA: Diagnosis not present

## 2020-06-01 HISTORY — PX: CARDIOVERSION: SHX1299

## 2020-06-01 LAB — GLUCOSE, CAPILLARY: Glucose-Capillary: 138 mg/dL — ABNORMAL HIGH (ref 70–99)

## 2020-06-01 SURGERY — CARDIOVERSION
Anesthesia: General

## 2020-06-01 MED ORDER — PROPOFOL 10 MG/ML IV BOLUS
INTRAVENOUS | Status: DC | PRN
  Administered 2020-06-01: 70 mg via INTRAVENOUS

## 2020-06-01 MED ORDER — SODIUM CHLORIDE 0.9 % IV SOLN: INTRAVENOUS | Status: DC | PRN

## 2020-06-01 MED ORDER — LIDOCAINE 2% (20 MG/ML) 5 ML SYRINGE
INTRAMUSCULAR | Status: DC | PRN
  Administered 2020-06-01: 100 mg via INTRAVENOUS

## 2020-06-01 NOTE — Interval H&P Note (Signed)
History and Physical Interval Note:  06/01/2020 12:13 PM  Kathleen Griffith  has presented today for surgery, with the diagnosis of A-FIB.  The various methods of treatment have been discussed with the patient and family. After consideration of risks, benefits and other options for treatment, the patient has consented to  Procedure(s): CARDIOVERSION (N/A) as a surgical intervention.  The patient's history has been reviewed, patient examined, no change in status, stable for surgery.  I have reviewed the patient's chart and labs.  Questions were answered to the patient's satisfaction.     Kirk Ruths

## 2020-06-01 NOTE — Procedures (Signed)
Electrical Cardioversion Procedure Note Kathleen Griffith [id number] 1941-11-06  Procedure: Electrical Cardioversion Indications:  Atrial Fibrillation  Procedure Details Consent: Risks of procedure as well as the alternatives and risks of each were explained to the (patient/caregiver).  Consent for procedure obtained. Time Out: Verified patient identification, verified procedure, site/side was marked, verified correct patient position, special equipment/implants available, medications/allergies/relevent history reviewed, required imaging and test results available.  Performed  Patient placed on cardiac monitor, pulse oximetry, supplemental oxygen as necessary.  Sedation given: Pt sedated by anesthesia with lidocaine 100 mg and diprovan 70 mg IV. Pacer pads placed anterior and posterior chest.  Cardioverted 1 time(s).  Cardioverted at [place].  Evaluation Findings: Post procedure EKG shows: Sinus bradycardia with pacs. Complications: None Patient did tolerate procedure well.   Kirk Ruths 06/01/2020, 12:16 PM

## 2020-06-01 NOTE — Transfer of Care (Signed)
Immediate Anesthesia Transfer of Care Note  Patient: Kathleen Griffith  Procedure(s) Performed: CARDIOVERSION (N/A )  Patient Location: Endoscopy Unit  Anesthesia Type:General  Level of Consciousness: awake and drowsy  Airway & Oxygen Therapy: Patient Spontanous Breathing  Post-op Assessment: Report given to RN, Post -op Vital signs reviewed and stable and Patient moving all extremities X 4  Post vital signs: Reviewed and stable  Last Vitals:  Vitals Value Taken Time  BP    Temp    Pulse    Resp    SpO2      Last Pain:  Vitals:   06/01/20 1230  TempSrc: Oral  PainSc: 0-No pain         Complications: No apparent anesthesia complications

## 2020-06-01 NOTE — Telephone Encounter (Signed)
Called to the listed phone number. No answer. Left a message to call us back if she has questions for GI.

## 2020-06-01 NOTE — Anesthesia Preprocedure Evaluation (Addendum)
Anesthesia Evaluation  Patient identified by MRN, date of birth, ID band Patient awake    Reviewed: Allergy & Precautions, NPO status , Patient's Chart, lab work & pertinent test results, reviewed documented beta blocker date and time   Airway Mallampati: II  TM Distance: >3 FB Neck ROM: Full    Dental no notable dental hx.    Pulmonary pneumonia, resolved, Patient abstained from smoking., former smoker,    Pulmonary exam normal breath sounds clear to auscultation       Cardiovascular hypertension, Pt. on medications and Pt. on home beta blockers + Past MI and + Peripheral Vascular Disease  Normal cardiovascular exam+ dysrhythmias Atrial Fibrillation  Rhythm:Regular Rate:Normal  Bilateral carotid stenosis Right 1-39% Left 40-50% IMPRESSIONS     1. Left ventricular ejection fraction, by estimation, is 50 to 55%. The left ventricle has low normal function. The left ventricle has no regional wall motion abnormalities. Left ventricular diastolic function could not be evaluated. 2. Right ventricular systolic function is normal. The right ventricular size is normal. There is normal pulmonary artery systolic pressure. 3. The mitral valve is normal in structure. No evidence of mitral valve regurgitation. No evidence of mitral stenosis. 4. The aortic valve is normal in structure. Aortic valve regurgitation is not visualized. No aortic stenosis is present.    Neuro/Psych PSYCHIATRIC DISORDERS Depression Diabetic neuropathy  Neuromuscular disease    GI/Hepatic Neg liver ROS, GERD  Medicated and Controlled,Hx/o angiodysplasia of cecum   Endo/Other  diabetes, Well Controlled, Type 2, Insulin Dependent  Renal/GU Renal disease  negative genitourinary   Musculoskeletal  (+) Arthritis , Osteoarthritis,  Fibromyalgia -Lumbar Stenosis   Abdominal   Peds  Hematology  (+) anemia , Xarelto therapy- last dose yesterday pm   Anesthesia  Other Findings   Reproductive/Obstetrics                            Anesthesia Physical Anesthesia Plan  ASA: III  Anesthesia Plan: General   Post-op Pain Management:  Regional for Post-op pain   Induction: Intravenous  PONV Risk Score and Plan: Ondansetron and Treatment may vary due to age or medical condition  Airway Management Planned: Natural Airway and Mask  Additional Equipment:   Intra-op Plan:   Post-operative Plan:   Informed Consent: I have reviewed the patients History and Physical, chart, labs and discussed the procedure including the risks, benefits and alternatives for the proposed anesthesia with the patient or authorized representative who has indicated his/her understanding and acceptance.     Dental advisory given  Plan Discussed with: CRNA and Surgeon  Anesthesia Plan Comments:         Anesthesia Quick Evaluation

## 2020-06-01 NOTE — Telephone Encounter (Signed)
Dr Havery Moros This very kind patient calls to ask about Acidophilus. She has been out of the hospital for 2 weeks now. She was given Acidophilus at discharge. She did not take it because she thought it was a replacement for the Prilosec. She continued the Prilosec. She states she has not had any bowel movement issues. No abdominal discomforts. She is not on any antibiotics since discharge. I told her she probably did not need to take it at this point. Explained the usual reason for acidophilus.  Do I need to call her back and tell her to start taking it? Again, she denies any diarrhea or constipation. GI feels "good."

## 2020-06-01 NOTE — Telephone Encounter (Signed)
Kathleen Griffith I don't think she needs to take it and can hold off. thanks

## 2020-06-01 NOTE — Discharge Instructions (Signed)
Electrical Cardioversion   What can I expect after the procedure?  Your blood pressure, heart rate, breathing rate, and blood oxygen level will be monitored until you leave the hospital or clinic.  Your heart rhythm will be watched to make sure it does not change.  You may have some redness on the skin where the shocks were given. Follow these instructions at home:  Do not drive for 24 hours if you were given a sedative during your procedure.  Take over-the-counter and prescription medicines only as told by your health care provider.  Ask your health care provider how to check your pulse. Check it often.  Rest for 48 hours after the procedure or as told by your health care provider.  Avoid or limit your caffeine use as told by your health care provider.  Keep all follow-up visits as told by your health care provider. This is important. Contact a health care provider if:  You feel like your heart is beating too quickly or your pulse is not regular.  You have a serious muscle cramp that does not go away. Get help right away if:  You have discomfort in your chest.  You are dizzy or you feel faint.  You have trouble breathing or you are short of breath.  Your speech is slurred.  You have trouble moving an arm or leg on one side of your body.  Your fingers or toes turn cold or blue. Summary  Electrical cardioversion is the delivery of a jolt of electricity to restore a normal rhythm to the heart.  This procedure may be done right away in an emergency or may be a scheduled procedure if the condition is not an emergency.  Generally, this is a safe procedure.  After the procedure, check your pulse often as told by your health care provider. This information is not intended to replace advice given to you by your health care provider. Make sure you discuss any questions you have with your health care provider. Document Revised: 07/19/2019 Document Reviewed:  07/19/2019 Elsevier Patient Education  2020 Elsevier Inc.  

## 2020-06-01 NOTE — H&P (Signed)
Office Visit     05/22/2020  [facility] Wendy Poet, MD   Cardiology        Persistent atrial fibrillation ([place]) +3 more   Dx        Follow-up . Atrial Fibrillation; Referred by Luetta Nutting, DO   Reason for Visit        Additional Documentation   Vitals:       BP 96/64       Pulse 87       Ht 5\' 3"  (1.6 m)       SpO2 99%       BMI 25.62 kg/m       BSA 1.71 m    Flowsheets:        NEWS,       MEWS Score     Encounter Info:        Billing Info,       History,       Allergies,       Detailed Report            All Notes      Progress Notes by Lelon Perla, MD at 05/22/2020 11:00 AM   Author: Lelon Perla, MD Author Type: Physician Filed: 05/22/2020 11:58 AM  Note Status: Signed Cosign: Cosign Not Required Encounter Date: 05/22/2020  Editor: Lelon Perla, MD (Physician)              untitled image              HPI: FU atrial fibrillation. Abdominal ultrasound May 2016 showed no aneurysm. Patient admitted with non-ST elevation myocardial infarction in June 2018. Cardiac catheterization revealed a 20% proximal to mid LAD and normal left ventricular end-diastolic pressure. Echocardiogram showed ejection fraction 00-76%, grade 2 diastolic dysfunction, mild tricuspid regurgitation, severely elevated pulmonary pressure. Sludge noted LV apex. 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 Echocardiogram repeated August 2018 and showed normal LV function, grade 1 diastolic dysfunction. ABIs March 2019 normal. Had urosepsis September 2019.  Was noted to have intermittent atrial fibrillation; felt to be secondary to stress of sepsis. Carotid Dopplers November 2020 showed 1 to 39% bilateral stenosis.  Echocardiogram May 2021 showed normal LV function.  Patient recently admitted with pneumonia.  She also was noted to be  back in atrial fibrillation.  She was managed with rate control.  Plan was to proceed with cardioversion once her pneumonia was treated.  Since last seen, her pneumonia has improved since she was hospitalized.  She denies dyspnea, chest pain, palpitations or syncope.  She does have some fatigue.             Current Outpatient Medications    Medication   Sig   Dispense   Refill    .   acetaminophen (TYLENOL) 650 MG CR tablet   Take 1,300 mg by mouth every 8 (eight) hours as needed for pain.            Marland Kitchen   acidophilus (RISAQUAD) CAPS capsule   Take 1 capsule by mouth daily.            Marland Kitchen   albuterol (VENTOLIN HFA) 108 (90 Base) MCG/ACT inhaler   Inhale 2 puffs into the lungs every 4 (four) hours as needed.            Marland Kitchen   alendronate (FOSAMAX) 70 MG tablet   Take 70  mg by mouth once a week.             .   B-D UF III MINI PEN NEEDLES 31G X 5 MM MISC   USE WITH LANTUS EVERY NIGHT AT BEDTIME   100 each   3    .   budesonide (PULMICORT) 0.25 MG/2ML nebulizer solution   Take 2 mLs (0.25 mg total) by nebulization 2 (two) times daily.   120 mL   0    .   diltiazem (CARDIZEM CD) 120 MG 24 hr capsule   Take 1 capsule (120 mg total) by mouth daily.   30 capsule   0    .   gabapentin (NEURONTIN) 100 MG capsule   Take 1 capsule (100 mg total) by mouth at bedtime.            Marland Kitchen   glucose blood (ONETOUCH ULTRA) test strip   Use to test blood sugars 2 times daily.   100 each   12    .   insulin glargine (LANTUS SOLOSTAR) 100 UNIT/ML Solostar Pen   Inject 20 Units into the skin daily.            Marland Kitchen   levalbuterol (XOPENEX) 0.63 MG/3ML nebulizer solution   Take 3 mLs (0.63 mg total) by nebulization 2 (two) times daily.   180 mL   0    .   metoprolol tartrate (LOPRESSOR) 25 MG tablet   Take 1 tablet (25 mg total) by mouth every 8 (eight) hours.   90 tablet   0    .   Multiple  Vitamins-Minerals (CENTRUM SILVER 50+WOMEN PO)   Take 1 tablet by mouth daily.            .   nicotine (NICODERM CQ - DOSED IN MG/24 HOURS) 14 mg/24hr patch   Place 1 patch (14 mg total) onto the skin daily.   28 patch   0    .   nitroGLYCERIN (NITROSTAT) 0.3 MG SL tablet   Place 1 tablet (0.3 mg total) under the tongue every 5 (five) minutes as needed for chest pain.   100 tablet   1    .   omeprazole (PRILOSEC) 20 MG capsule   Take 2 capsules (40 mg total) by mouth 2 (two) times daily. KEEP 06/01/20 OFFICE VISIT FOR FURTHER REFILLS (Patient taking differently: Take 40 mg by mouth 2 (two) times daily. )   120 capsule   1    .   pravastatin (PRAVACHOL) 20 MG tablet   Take 20 mg by mouth at bedtime.            .   sertraline (ZOLOFT) 100 MG tablet   Take 50 mg by mouth daily.            .   sucralfate (CARAFATE) 1 g tablet   Take 1 tablet (1 g total) by mouth 4 (four) times daily -  with meals and at bedtime.   120 tablet   2    .   VITAMIN D PO   Take 1 capsule by mouth daily.            Alveda Reasons 20 MG TABS tablet   Take 20 mg by mouth at bedtime.                No current facility-administered medications for this visit.  Past Medical History:    Diagnosis   Date    .   Anemia            microcytic     .   Arthritis        .   Atrial fibrillation ([place])        .   Cataract            REMOVED BILATERAL    .   Diabetes mellitus ([place])        .   Diverticulosis        .   Duodenitis        .   Fibromyalgia        .   Gastric polyp        .   Gastroparesis        .   Hyperlipidemia        .   Hypertension        .   Kidney stones        .   Melanoma ([place])        .   Microcytic anemia        .   PMR (polymyalgia rheumatica) ([facility])        .   PVD (peripheral vascular  disease) ([place])        .   Renal cyst        .   Tubular adenoma of colon                    Past Surgical History:    Procedure   Laterality   Date    .   APPENDECTOMY            .   BREAST BIOPSY            .   CESAREAN SECTION            .   COLONOSCOPY            .   COLONOSCOPY WITH PROPOFOL   N/A   12/26/2015        Procedure: COLONOSCOPY WITH PROPOFOL;  Surgeon: Manus Gunning, MD;  Location: [facility];  Service: Gastroenterology;  Laterality: N/A;    .   ESOPHAGOGASTRODUODENOSCOPY (EGD) WITH PROPOFOL   N/A   12/26/2015        Procedure: ESOPHAGOGASTRODUODENOSCOPY (EGD) WITH PROPOFOL;  Surgeon: Manus Gunning, MD;  Location: [facility];  Service: Gastroenterology;  Laterality: N/A;    .   LEFT HEART CATH AND CORONARY ANGIOGRAPHY   N/A   06/03/2017        Procedure: Left Heart Cath and Coronary Angiography;  Surgeon: Martinique, Peter M, MD;  Location: [facility];  Service: Cardiovascular;  Laterality: N/A;    .   POLYPECTOMY                   Social History             Socioeconomic History    .   Marital status:   Widowed            Spouse name:   Not on file    .   Number of children:   1    .   Years of education:   Not on file    .   Highest education level:   Not on file    Occupational History    .  Not on file    Tobacco Use    .   Smoking status:   Former Smoker            Packs/day:   1.00            Years:   60.00            Pack years:   60.00            Types:   Cigarettes    .   Smokeless tobacco:   Never Used    Substance and Sexual Activity    .   Alcohol use:   No            Alcohol/week:   0.0 standard drinks    .   Drug use:   No    .   Sexual activity:   Not on file    Other Topics   Concern    .    Not on file    Social History Narrative    .   Not on file        Social Determinants of Health           Financial Resource Strain:     .   Difficulty of Paying Living Expenses:     Food Insecurity:     .   Worried About Charity fundraiser in the Last Year:     .   Arboriculturist in the Last Year:     Transportation Needs:     .   Film/video editor (Medical):     Marland Kitchen   Lack of Transportation (Non-Medical):     Physical Activity:     .   Days of Exercise per Week:     .   Minutes of Exercise per Session:     Stress:     .   Feeling of Stress :     Social Connections:     .   Frequency of Communication with Friends and Family:     .   Frequency of Social Gatherings with Friends and Family:     .   Attends Religious Services:     .   Active Member of Clubs or Organizations:     .   Attends Archivist Meetings:     Marland Kitchen   Marital Status:     Intimate Partner Violence:     .   Fear of Current or Ex-Partner:     .   Emotionally Abused:     Marland Kitchen   Physically Abused:     .   Sexually Abused:                 Family History    Problem   Relation   Age of Onset    .   CAD   Father                MI and CABG    .   Diabetes   Father        .   Heart disease   Father        .   Colon cancer   Mother   85          ROS: no fevers or chills, productive cough, hemoptysis, dysphasia, odynophagia, melena, hematochezia, dysuria, hematuria, rash, seizure activity, orthopnea, PND, pedal edema, claudication. Remaining systems are negative.     Physical Exam:  Well-developed well-nourished in no acute distress.   Skin is warm and dry.   HEENT is normal.   Neck is supple.   Chest is clear to auscultation with normal expansion.   Cardiovascular exam is irregular  Abdominal exam nontender or distended. No masses  palpated.  Extremities show no edema.  neuro grossly intact     ECG-atrial fibrillation at a rate of 94, normal axis, no ST changes.  Personally reviewed     A/P     1 persistent atrial fibrillation-patient remains in atrial fibrillation.  I think this was likely caused by the stress of her pneumonia.  She has now been treated and improved. She has been compliant with Xarelto and has not missed any doses.  We will arrange cardioversion.  Continue Cardizem but decrease metoprolol to 25 mg twice daily.  Hold metoprolol the night before and the morning of her procedure.  If she does not maintain sinus rhythm may need to consider antiarrhythmic.     2 hypertension-blood pressure controlled.  Continue present medical regimen.     3 hyperlipidemia-continue statin.  Note she did not tolerate higher doses in the past.     4 history of stress-induced cardiomyopathy-LV function has normalized and is normal on most recent study.     5 carotid artery disease/peripheral vascular disease-plan follow-up carotid Dopplers November 2021.  Continue statin.  She is not on aspirin given need for anticoagulation.     6 tobacco abuse-patient recently discontinued.     Kirk Ruths, MD       For DCCV; compliant with xarelto; no changes Kirk Ruths

## 2020-06-01 NOTE — Anesthesia Postprocedure Evaluation (Signed)
Anesthesia Post Note  Patient: Kathleen Griffith  Procedure(s) Performed: CARDIOVERSION (N/A )     Patient location during evaluation: PACU Anesthesia Type: General Level of consciousness: sedated Pain management: pain level controlled Vital Signs Assessment: post-procedure vital signs reviewed and stable Respiratory status: spontaneous breathing and respiratory function stable Cardiovascular status: stable Postop Assessment: no apparent nausea or vomiting Anesthetic complications: no    Last Vitals:  Vitals:   06/01/20 1310 06/01/20 1320  BP: (!) 101/39 128/82  Pulse: (!) 51 (!) 38  Resp: 18 16  Temp:    SpO2:  97%    Last Pain:  Vitals:   06/01/20 1320  TempSrc:   PainSc: 0-No pain                 Khristie Sak DANIEL

## 2020-06-01 NOTE — Anesthesia Procedure Notes (Signed)
Procedure Name: General with mask airway Date/Time: 06/01/2020 12:54 PM Performed by: Harden Mo, CRNA Pre-anesthesia Checklist: Patient identified, Emergency Drugs available, Suction available and Patient being monitored Patient Re-evaluated:Patient Re-evaluated prior to induction Oxygen Delivery Method: Ambu bag Preoxygenation: Pre-oxygenation with 100% oxygen Induction Type: IV induction Placement Confirmation: positive ETCO2 and breath sounds checked- equal and bilateral Dental Injury: Teeth and Oropharynx as per pre-operative assessment

## 2020-06-01 NOTE — Telephone Encounter (Signed)
Pt aware.

## 2020-06-02 ENCOUNTER — Telehealth: Payer: Self-pay | Admitting: Cardiology

## 2020-06-02 MED ORDER — DILTIAZEM HCL ER COATED BEADS 120 MG PO CP24: 120.0000 mg | ORAL_CAPSULE | Freq: Every day | ORAL | 3 refills | Status: AC

## 2020-06-02 NOTE — Telephone Encounter (Signed)
  Pt c/o medication issue:  1. Name of Medication: albuterol (VENTOLIN HFA) 108 (90 Base) MCG/ACT inhaler  2. How are you currently taking this medication (dosage and times per day)? Not currently taking  3. Are you having a reaction (difficulty breathing--STAT)? no  4. What is your medication issue? Patient states there's 2 parts that you mix, but she only received one. She states the hospital prescribed it about 2 weeks ago, but she has not been able to get it. States she never got the inhaler. Patient also states from her hips down she has pain and bruises. She states she would like something effective for the pain, because she cannot sleep through it. She also states she had a cardioversion yesterday and that evening she went back into afib for about an hour. She states she does not have any symptoms and had no chest pain or SOB yesterday. She states her HR is fine.

## 2020-06-02 NOTE — Telephone Encounter (Signed)
Spoke with patient. Patient had questions regarding budesonide and levalbuterol. Per recent discharge summary those medications were discontinued, patient verbalized understanding.   Patient requests diltiazem refill, refill sent to preferred pharmacy.   Patient wanted to inform Dr. Stanford Breed she had an episode of afib last night for about an hour but went back into sinus rhythm. Patient not in afib currently. She will continue to monitor and will call back if anything changes.   Patient advised to contact her PCP for pain medication for her legs. Patient verbalized understanding.

## 2020-06-02 NOTE — Telephone Encounter (Signed)
*  STAT* If patient is at the pharmacy, call can be transferred to refill team.   1. Which medications need to be refilled? (please list name of each medication and dose if known) diltiazem (CARDIZEM CD) 120 MG 24 hr capsule  2. Which pharmacy/location (including street and city if local pharmacy) is medication to be sent to? [facility] at [place], [place]  3. Do they need a 30 day or 90 day supply? 90 day

## 2020-06-05 ENCOUNTER — Telehealth: Payer: Self-pay | Admitting: Cardiology

## 2020-06-05 NOTE — Telephone Encounter (Signed)
Spoke with pt, Aware of dr crenshaw's recommendations.  °

## 2020-06-05 NOTE — Telephone Encounter (Signed)
Spoke with pt, she is getting heart rates in the low 50's to 60's. She feels she is still in regular rhythm. She is wanting to make sure the diltiazem and metoprolol are at the correct dose with her heart rate running low. She reports feeling fine with only occasional dizziness but not often al at and she reports her bp is good. Will forward for dr Stanford Breed review

## 2020-06-05 NOTE — Telephone Encounter (Signed)
Hr ok if 50s and 60s if she feels well Kathleen Griffith

## 2020-06-05 NOTE — Telephone Encounter (Signed)
New Message    Pt is calling and only wants to speak to Kathleen Griffith  She is wondering if she can get a call back after 230  She wants to talk about medication and her Heart rate    Please call

## 2020-06-26 ENCOUNTER — Telehealth: Payer: Self-pay | Admitting: Cardiology

## 2020-06-26 ENCOUNTER — Telehealth: Payer: Self-pay | Admitting: Nurse Practitioner

## 2020-06-26 NOTE — Telephone Encounter (Signed)
Conti..  Omeprazole.

## 2020-06-26 NOTE — Telephone Encounter (Signed)
Will fax this note to [employer] [facility] @ [phone].

## 2020-06-26 NOTE — Telephone Encounter (Signed)
Ok to [place]

## 2020-06-26 NOTE — Telephone Encounter (Signed)
Spoke with pt, her oxygen sat is running 96% and 94% with no oxygen. She has not used the oxygen for sometime now. She would like to get it taken out of the home. Will forward for dr Stanford Breed review for order to stop oxygen. She is having a lot of issues with joint pain so she was given the okay to take a statin holiday from the pravastatin. She will let us know how she is feeling in a couple weeks.

## 2020-06-26 NOTE — Telephone Encounter (Signed)
Patient states she has had O2 for 2 weeks but she has not used it since being home from the hospital. She doesn't feel she needs it and wants to know if she can get rid of it. She would like to see if Dr Stanford Breed would discontinue the O2.

## 2020-06-27 NOTE — Telephone Encounter (Signed)
[employer] rep called to inform that they will be faxing form with clinical questions needed to approve Omeprazole . Any question pls call [employer] at [phone]. Ref [id number]

## 2020-06-28 NOTE — Telephone Encounter (Signed)
Received form from [facility], completed and faxed back.

## 2020-06-29 NOTE — Telephone Encounter (Signed)
Omeprazole 20 mg BID does not need Prior Auth. [phone]

## 2020-07-07 NOTE — Telephone Encounter (Signed)
Order refaxed, patient made aware

## 2020-07-07 NOTE — Telephone Encounter (Signed)
Patient calling back stating [facility] has not received the fax to remove her oxygen. She would like a call back when it is sent again.

## 2020-07-12 ENCOUNTER — Encounter: Payer: Self-pay | Admitting: Cardiology

## 2020-07-12 NOTE — Telephone Encounter (Signed)
Spoke with pt, she reports the pain has not really changed. She is having pain in her legs at rest and hurt worse when she walks. The lower legs will sting and burn by the evening time. They are discolored a reddish brown color. Her toes are numb. She has seen a neurologist in the past that said it was due to degenerative or neuropathy. She has a follow up with dr Stanford Breed in 6 weeks and will remain off the pravastatin until seen. Pt agreed with this plan.

## 2020-07-12 NOTE — Telephone Encounter (Signed)
Patient calling back to speak with Hilda Blades.

## 2020-07-12 NOTE — Telephone Encounter (Signed)
Called pt back, she states that 3 weeks ago she started  pravastatin and Debra told her to "call back in a few weeks to update her on her "pain"" she states that her pain in her shoulders and hips is a little better but is still hurting her. Will send message to [facility].

## 2020-07-12 NOTE — Telephone Encounter (Signed)
error 

## 2020-07-20 ENCOUNTER — Other Ambulatory Visit: Payer: Self-pay | Admitting: Cardiology

## 2020-07-20 NOTE — Telephone Encounter (Signed)
*  STAT* If patient is at the pharmacy, call can be transferred to refill team.   1. Which medications need to be refilled? (please list name of each medication and dose if known) XARELTO 20 MG TABS tablet  2. Which pharmacy/location (including street and city if local pharmacy) is medication to be sent to? [facility] at [place], [place]  3. Do they need a 30 day or 90 day supply? 30 day supply

## 2020-07-20 NOTE — Telephone Encounter (Signed)
15f 70.9kg Scr 1.26 ccr 40.5 Lovw/crenshaw 05/22/20 Pt requesting 20mg  xarelto when only qualifies for the 15mg  based on dosing guidelines. Will route to the pharmd pool for further review

## 2020-07-21 ENCOUNTER — Telehealth: Payer: Self-pay | Admitting: Cardiology

## 2020-07-21 MED ORDER — RIVAROXABAN 15 MG PO TABS
15.0000 mg | ORAL_TABLET | Freq: Every day | ORAL | 1 refills | Status: DC
Start: 2020-07-21 — End: 2020-11-10

## 2020-07-21 NOTE — Telephone Encounter (Signed)
Dx Afrib (no hx of VTE noted) Est.CrCl = 23ml/min

## 2020-07-21 NOTE — Telephone Encounter (Signed)
Returned call to patient-she states someone called this morning and unsure who.   She also was unsure about her Xarelto dose and why this was changed.    Per chart review: office got refill request and based on guidelines, pharmD changed dose to 15mg .     Advised this was sent to pharmacy.  Patient aware and verbalized understanding.   Aware of OV 8/30 with Dr. Stanford Breed

## 2020-07-21 NOTE — Addendum Note (Signed)
Addended by: Harrington Challenger on: 07/21/2020 09:15 AM   Modules accepted: Orders

## 2020-07-21 NOTE — Telephone Encounter (Signed)
Patient states she is returning a call from this morning. I did not see a note.

## 2020-07-21 NOTE — Telephone Encounter (Signed)
Patient agrees on Xarelto 15mg  daily and will re-assess dose during OV with DR Stanford Breed in 30 days

## 2020-07-25 ENCOUNTER — Encounter: Payer: Self-pay | Admitting: Gastroenterology

## 2020-07-25 ENCOUNTER — Ambulatory Visit: Payer: Medicare HMO | Admitting: Gastroenterology

## 2020-07-25 VITALS — BP 110/70 | HR 46 | Ht 63.0 in | Wt 152.0 lb

## 2020-07-25 DIAGNOSIS — R933 Abnormal findings on diagnostic imaging of other parts of digestive tract: Secondary | ICD-10-CM

## 2020-07-25 DIAGNOSIS — K2980 Duodenitis without bleeding: Secondary | ICD-10-CM | POA: Diagnosis not present

## 2020-07-25 DIAGNOSIS — K5900 Constipation, unspecified: Secondary | ICD-10-CM | POA: Diagnosis not present

## 2020-07-25 NOTE — Patient Instructions (Addendum)
If you are age 79 or older, your body mass index should be between 23-30. Your Body mass index is 26.93 kg/m. If this is out of the aforementioned range listed, please consider follow up with your Primary Care Provider.  If you are age 35 or younger, your body mass index should be between 19-25. Your Body mass index is 26.93 kg/m. If this is out of the aformentioned range listed, please consider follow up with your Primary Care Provider.   We are giving you samples of Fiber Choice Chews: Take 2 or 3 a day.  You can also try Miralax over-the-counter, once daily.  Thank you for entrusting me with your care and for choosing [facility], Dr. [place] Cellar

## 2020-07-25 NOTE — Progress Notes (Signed)
HPI :  79 year old female here for follow-up visit for abdominal pain.  In December she was in the emergency room for nausea and vomiting with epigastric pain associated with poor p.o. intake.  Her labs looked okay other than acute kidney injury on top of CKD.  She had a CT scan which questioned possible bowel wall thickening in the fourth portion of the duodenum.  At the time she was taking Aleve as needed.  She was given Carafate and placed on omeprazole which was dosed at 40 mg twice daily.  We had discussed a potential enteroscopy to reevaluate these findings, however unfortunately she was hospitalized in May with pneumonia and A. fib with RVR.  She had a difficult time recovering from this, was placed on Cardizem on top of Toprol for her A. fib, already on Xarelto.  She lost 10 pounds during that hospitalization but her weight has since been stable.  She has continued the omeprazole but has stopped the Carafate.  At this point time she has been doing much better.  She seems to be eating okay, no nausea vomiting, no postprandial abdominal pain.  Her appetite is not back to what it used to be but again she states she is feeling much better.  Since she was been out of the hospital she is been having problems with constipation, passing hard stools, rarely using manual maneuvers to get up stool.  Using Dulcolax suppositories as needed.  He had an echo in May showing EF of 50 to 55%.   12.28.20 CTAP w/ contrast IMPRESSION: 1. Questionable wall thickening with adjacent edema about the fourth portion of the duodenum, suggesting duodenitis. 2. Colonic diverticulosis without acute inflammation. 3. 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 They were both injected with epinephrine (total 2.5cc) and then ablated using APC with good result and no evidence of bleeding. A 57mm sessile polyp was noted in the cecum and removed with cold forceps. Mild diverticulosis was noted in the sigmoid colon. Multiple diminutive benign appearing hyperplastic polyps were noted in the rectum and sigmoid colon. The terminal ileum was normal. The colon was spastic and glucagon was used to treat spasm during the procedure. The remainder of the examined colon was normal. Retroflexed views revealed internal hemorrhoids.       Past Medical History:  Diagnosis Date  . Anemia    microcytic   . Arthritis   . Atrial fibrillation ([place])   . Cataract    REMOVED BILATERAL  . Diabetes mellitus ([place])   . Diverticulosis   . Duodenitis   . Fibromyalgia   . Gastric polyp   . Gastroparesis   . Hyperlipidemia   . Hypertension   . Kidney stones   . Melanoma ([place])   . Microcytic anemia   . PMR (polymyalgia rheumatica) ([facility])   . PVD (peripheral vascular disease) ([place])   . Renal cyst   . Tubular adenoma of colon      Past Surgical History:  Procedure Laterality Date  . APPENDECTOMY    . BREAST BIOPSY    . CARDIOVERSION N/A 06/01/2020   Procedure: CARDIOVERSION;  Surgeon: Lelon Perla, MD;  Location: [facility]  ENDOSCOPY;  Service: Cardiovascular;  Laterality: N/A;  . CESAREAN SECTION    . COLONOSCOPY    . COLONOSCOPY WITH PROPOFOL N/A 12/26/2015   Procedure: COLONOSCOPY WITH PROPOFOL;  Surgeon: Manus Gunning, MD;  Location: [facility] ENDOSCOPY;  Service: Gastroenterology;  Laterality: N/A;  . ESOPHAGOGASTRODUODENOSCOPY (EGD) WITH PROPOFOL N/A 12/26/2015   Procedure: ESOPHAGOGASTRODUODENOSCOPY (EGD) WITH PROPOFOL;  Surgeon: Manus Gunning, MD;  Location: [facility] ENDOSCOPY;  Service: Gastroenterology;   Laterality: N/A;  . LEFT HEART CATH AND CORONARY ANGIOGRAPHY N/A 06/03/2017   Procedure: Left Heart Cath and Coronary Angiography;  Surgeon: Martinique, Peter M, MD;  Location: [facility];  Service: Cardiovascular;  Laterality: N/A;  . POLYPECTOMY     Family History  Problem Relation Age of Onset  . CAD Father        MI and CABG  . Diabetes Father   . Heart disease Father   . Colon cancer Mother 53   Social History   Tobacco Use  . Smoking status: Former Smoker    Packs/day: 1.00    Years: 60.00    Pack years: 60.00    Types: Cigarettes  . Smokeless tobacco: Never Used  Vaping Use  . Vaping Use: Never used  Substance Use Topics  . Alcohol use: No    Alcohol/week: 0.0 standard drinks  . Drug use: No   Current Outpatient Medications  Medication Sig Dispense Refill  . acetaminophen (TYLENOL) 650 MG CR tablet Take 1,300 mg by mouth every 8 (eight) hours as needed for pain.    Marland Kitchen albuterol (VENTOLIN HFA) 108 (90 Base) MCG/ACT inhaler Inhale 2 puffs into the lungs every 4 (four) hours as needed for wheezing.     Marland Kitchen alendronate (FOSAMAX) 70 MG tablet Take 70 mg by mouth once a week. Sunday    . B-D UF III MINI PEN NEEDLES 31G X 5 MM MISC USE WITH LANTUS EVERY NIGHT AT BEDTIME 100 each 3  . diltiazem (CARDIZEM CD) 120 MG 24 hr capsule Take 1 capsule (120 mg total) by mouth at bedtime. 90 capsule 3  . gabapentin (NEURONTIN) 100 MG capsule Take 1 capsule (100 mg total) by mouth at bedtime.    Marland Kitchen glucose blood (ONETOUCH ULTRA) test strip Use to test blood sugars 2 times daily. 100 each 12  . insulin glargine (LANTUS SOLOSTAR) 100 UNIT/ML Solostar Pen Inject 20 Units into the skin daily.    . metoprolol tartrate (LOPRESSOR) 25 MG tablet Take 1 tablet (25 mg total) by mouth 2 (two) times daily. 180 tablet 3  . Multiple Vitamins-Minerals (CENTRUM SILVER 50+WOMEN PO) Take 1 tablet by mouth daily.    . nitroGLYCERIN (NITROSTAT) 0.3 MG SL tablet Place 1 tablet (0.3 mg total) under the tongue  every 5 (five) minutes as needed for chest pain. 100 tablet 1  . omeprazole (PRILOSEC) 20 MG capsule Take 2 capsules (40 mg total) by mouth 2 (two) times daily. KEEP 06/01/20 OFFICE VISIT FOR FURTHER REFILLS (Patient taking differently: Take 20 mg by mouth 2 (two) times daily. ) 120 capsule 1  . pravastatin (PRAVACHOL) 20 MG tablet Take 20 mg by mouth at bedtime.    . Rivaroxaban (XARELTO) 15 MG TABS tablet Take 1 tablet (15 mg total) by mouth daily with supper. 30 tablet 1  . sertraline (ZOLOFT) 50 MG tablet Take 50 mg by mouth daily.     Marland Kitchen VITAMIN D PO Take 1 capsule by mouth daily.     No current facility-administered medications for this  visit.   Allergies  Allergen Reactions  . Anoro Ellipta [Umeclidinium-Vilanterol] Palpitations  . Losartan Shortness Of Breath    wheezing wheezing  . Norvasc [Amlodipine Besylate] Swelling  . Pregabalin Swelling    TO LOWER EXTREMTIES TO LOWER EXTREMTIES  . Bupropion     Other reaction(s): Other Sucidal thoughts  . Eliquis [Apixaban] Nausea And Vomiting    Severe stomach pain  . Metoclopramide Other (See Comments)    Tardive dyskinesia, tremors  . Lisinopril Cough  . Metformin Diarrhea  . Metformin And Related Diarrhea     Review of Systems: All systems reviewed and negative except where noted in HPI.   Lab Results  Component Value Date   WBC 8.5 05/17/2020   HGB 11.9 (L) 05/17/2020   HCT 38.1 05/17/2020   MCV 91.6 05/17/2020   PLT 344 05/17/2020    Lab Results  Component Value Date   CREATININE 1.26 (H) 05/17/2020   BUN 21 05/17/2020   NA 137 05/17/2020   K 4.2 05/17/2020   CL 101 05/17/2020   CO2 25 05/17/2020    Lab Results  Component Value Date   ALT 14 05/06/2020   AST 15 05/06/2020   ALKPHOS 88 05/06/2020   BILITOT 0.8 05/06/2020     Physical Exam: BP 110/70   Pulse 46   Ht 5\' 3"  (1.6 m)   Wt 152 lb (68.9 kg)   BMI 26.93 kg/m  Constitutional: Pleasant, female in no acute distress, using walker to  ambulate Abdominal: Soft, nondistended, nontender.  There are no masses palpable.  Extremities: no edema Lymphadenopathy: No cervical adenopathy noted. Neurological: Alert and oriented to person place and time. Skin: Skin is warm and dry. No rashes noted. Psychiatric: Normal mood and affect. Behavior is normal.   ASSESSMENT AND PLAN: 79 year old female here for reassessment following:  Duodenitis / abnormal CT scan of the GI tract -suspicion of duodenitis in which there were some mild changes on prior CT scan in December when she had severe symptoms.  She has responded well to PPI and Carafate, now off Carafate.  Overall doing much better.  We did discuss that I would normally recommend follow-up enteroscopy to evaluate her bowel and ensure no polyp or mass in this area given the CT findings, however discussed risks and benefits of doing that and that of anesthesia.  She had a really tough time with pneumonia and recovering from A. fib with RVR in May, she does not feel comfortable proceeding with a procedure right now, and states she needs more time before considering something like that.  In fact she wants to avoid invasive procedures if at all possible.  Risks of not performing a follow-up exam include missing potential premalignant or malignant process, however hopefully risk of that is low, CT changes were fairly mild.  She wants to continue the omeprazole for now, I will see her back in the office for follow-up in about 4 months or so we can discuss this again and see how she feels.  If any recurrence of symptoms or problems she should contact me in the interim.  Constipation - got worse since her hospitalization in May and since she was placed on Cardizem which could be related.  Recommend using MiraLAX daily and titrate up as needed or use of fiber supplementation, we gave her some samples of Fiberchoice in the office today.  If this does not help recommend she purchase MiraLAX  over-the-counter.  She can follow-up as needed for this  Epps Cellar, MD [facility]

## 2020-07-31 ENCOUNTER — Telehealth: Payer: Self-pay | Admitting: Cardiology

## 2020-07-31 NOTE — Telephone Encounter (Signed)
Called patient, she states she would like to speak to [place].

## 2020-07-31 NOTE — Telephone Encounter (Signed)
Spoke with pt, she hit her leg over the weekend and now it is leaking a clear fluid. She will continue to watch the area to make sure it does not get infected. She will call her medical doctor if the area starts to look angry to get antibiotics. Pt agreed with this plan.

## 2020-07-31 NOTE — Telephone Encounter (Signed)
Pt c/o swelling: STAT is pt has developed SOB within 24 hours  1) How much weight have you gained and in what time span? No  2) If swelling, where is the swelling located? Both legs  3) Are you currently taking a fluid pill? No  4) Are you currently SOB? No   5) Do you have a log of your daily weights (if so, list)? No  6) Have you gained 3 pounds in a day or 5 pounds in a week? No  7) Have you traveled recently? No

## 2020-08-21 NOTE — Progress Notes (Signed)
HPI:FU atrial fibrillation. Abdominal ultrasound May 2016 showed no aneurysm. Patient admitted with non-ST elevation myocardial infarction in June 2018. Cardiac catheterization revealed a 20% proximal to mid LAD and normal left ventricular end-diastolic pressure. Echocardiogram showed ejection fraction 70-35%, grade 2 diastolic dysfunction, mild tricuspid regurgitation, severely elevated pulmonary pressure. Sludge noted LV apex. 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 Was noted to have intermittent atrial fibrillation;felt to be secondary to stress of sepsis.Carotid Dopplers November 2020showed 1 to 39% bilateral stenosis.  Echocardiogram May 2021 showed normal LV function.  Patient recently admitted with pneumonia.  She also was noted to be back in atrial fibrillation.  She was managed with rate control.  Plan was to proceed with cardioversion once her pneumonia was treated.    Patient had elective cardioversion June 2021.  Sincelast seen,  she has poor energy.  She however denies dyspnea, chest pain or syncope.  Occasional mild pedal edema.  Current Outpatient Medications  Medication Sig Dispense Refill  . acetaminophen (TYLENOL) 650 MG CR tablet Take 1,300 mg by mouth every 8 (eight) hours as needed for pain.    Marland Kitchen albuterol (VENTOLIN HFA) 108 (90 Base) MCG/ACT inhaler Inhale 2 puffs into the lungs every 4 (four) hours as needed for wheezing.     Marland Kitchen alendronate (FOSAMAX) 70 MG tablet Take 70 mg by mouth once a week. Sunday    . B-D UF III MINI PEN NEEDLES 31G X 5 MM MISC USE WITH LANTUS EVERY NIGHT AT BEDTIME 100 each 3  . diltiazem (CARDIZEM CD) 120 MG 24 hr capsule Take 1 capsule (120 mg total) by mouth at bedtime. 90 capsule 3  . gabapentin (NEURONTIN) 100 MG capsule Take 1 capsule (100 mg total) by mouth at bedtime.    Marland Kitchen glucose blood (ONETOUCH ULTRA) test strip Use to test blood sugars 2 times daily. 100 each 12  . insulin  glargine (LANTUS SOLOSTAR) 100 UNIT/ML Solostar Pen Inject 20 Units into the skin daily.    . metoprolol tartrate (LOPRESSOR) 25 MG tablet Take 1 tablet (25 mg total) by mouth 2 (two) times daily. 180 tablet 3  . Multiple Vitamins-Minerals (CENTRUM SILVER 50+WOMEN PO) Take 1 tablet by mouth daily.    . nitroGLYCERIN (NITROSTAT) 0.3 MG SL tablet Place 1 tablet (0.3 mg total) under the tongue every 5 (five) minutes as needed for chest pain. 100 tablet 1  . omeprazole (PRILOSEC) 20 MG capsule Take 2 capsules (40 mg total) by mouth 2 (two) times daily. KEEP 06/01/20 OFFICE VISIT FOR FURTHER REFILLS (Patient taking differently: Take 20 mg by mouth 2 (two) times daily. ) 120 capsule 1  . Rivaroxaban (XARELTO) 15 MG TABS tablet Take 1 tablet (15 mg total) by mouth daily with supper. 30 tablet 1  . sertraline (ZOLOFT) 50 MG tablet Take 50 mg by mouth daily.     Marland Kitchen VITAMIN D PO Take 1 capsule by mouth daily.     No current facility-administered medications for this visit.     Past Medical History:  Diagnosis Date  . Anemia    microcytic   . Arthritis   . Atrial fibrillation ([place])   . Cataract    REMOVED BILATERAL  . Diabetes mellitus ([place])   . Diverticulosis   . Duodenitis   . Fibromyalgia   . Gastric polyp   . Gastroparesis   . Hyperlipidemia   . Hypertension   . Kidney stones   . Melanoma ([place])   .  Microcytic anemia   . PMR (polymyalgia rheumatica) ([facility])   . PVD (peripheral vascular disease) ([place])   . Renal cyst   . Tubular adenoma of colon     Past Surgical History:  Procedure Laterality Date  . APPENDECTOMY    . BREAST BIOPSY    . CARDIOVERSION N/A 06/01/2020   Procedure: CARDIOVERSION;  Surgeon: Lelon Perla, MD;  Location: [facility] ENDOSCOPY;  Service: Cardiovascular;  Laterality: N/A;  . CESAREAN SECTION    . COLONOSCOPY    . COLONOSCOPY WITH PROPOFOL N/A 12/26/2015   Procedure: COLONOSCOPY WITH PROPOFOL;  Surgeon: Manus Gunning, MD;  Location: [facility] ENDOSCOPY;  Service:  Gastroenterology;  Laterality: N/A;  . ESOPHAGOGASTRODUODENOSCOPY (EGD) WITH PROPOFOL N/A 12/26/2015   Procedure: ESOPHAGOGASTRODUODENOSCOPY (EGD) WITH PROPOFOL;  Surgeon: Manus Gunning, MD;  Location: [facility] ENDOSCOPY;  Service: Gastroenterology;  Laterality: N/A;  . LEFT HEART CATH AND CORONARY ANGIOGRAPHY N/A 06/03/2017   Procedure: Left Heart Cath and Coronary Angiography;  Surgeon: Martinique, Peter M, MD;  Location: [facility] CV LAB;  Service: Cardiovascular;  Laterality: N/A;  . POLYPECTOMY      Social History   Socioeconomic History  . Marital status: Widowed    Spouse name: Not on file  . Number of children: 1  . Years of education: Not on file  . Highest education level: Not on file  Occupational History  . Not on file  Tobacco Use  . Smoking status: Former Smoker    Packs/day: 1.00    Years: 60.00    Pack years: 60.00    Types: Cigarettes  . Smokeless tobacco: Never Used  Vaping Use  . Vaping Use: Never used  Substance and Sexual Activity  . Alcohol use: No    Alcohol/week: 0.0 standard drinks  . Drug use: No  . Sexual activity: Not on file  Other Topics Concern  . Not on file  Social History Narrative  . Not on file   Social Determinants of Health   Financial Resource Strain:   . Difficulty of Paying Living Expenses: Not on file  Food Insecurity:   . Worried About Charity fundraiser in the Last Year: Not on file  . Ran Out of Food in the Last Year: Not on file  Transportation Needs:   . Lack of Transportation (Medical): Not on file  . Lack of Transportation (Non-Medical): Not on file  Physical Activity:   . Days of Exercise per Week: Not on file  . Minutes of Exercise per Session: Not on file  Stress:   . Feeling of Stress : Not on file  Social Connections:   . Frequency of Communication with Friends and Family: Not on file  . Frequency of Social Gatherings with Friends and Family: Not on file  . Attends Religious Services: Not on file  . Active  Member of Clubs or Organizations: Not on file  . Attends Archivist Meetings: Not on file  . Marital Status: Not on file  Intimate Partner Violence:   . Fear of Current or Ex-Partner: Not on file  . Emotionally Abused: Not on file  . Physically Abused: Not on file  . Sexually Abused: Not on file    Family History  Problem Relation Age of Onset  . CAD Father        MI and CABG  . Diabetes Father   . Heart disease Father   . Colon cancer Mother 5    ROS: Fatigue and lack of energy but  no fevers or chills, productive cough, hemoptysis, dysphasia, odynophagia, melena, hematochezia, dysuria, hematuria, rash, seizure activity, orthopnea, PND, claudication. Remaining systems are negative.  Physical Exam: Well-developed well-nourished in no acute distress.  Skin is warm and dry.  HEENT is normal.  Neck is supple.  Chest is clear to auscultation with normal expansion.  Cardiovascular exam is regular rate and rhythm.  Abdominal exam nontender or distended. No masses palpated. Extremities show trace edema. neuro grossly intact  ECG-sinus rhythm with rhythm strip, normal axis, no ST changes.  Personally reviewed  A/P  1 paroxysmal atrial fibrillation-patient remains in sinus rhythm following her cardioversion.  Continue Cardizem and metoprolol.  Continue Xarelto.  Check hemoglobin and renal function.  2 hypertension-patient's blood pressure is controlled today.  Continue present medications.  3 hyperlipidemia-patient is intolerant to statins.  Check lipids.  If elevated will add Zetia or consider Repatha or Praluent.  4 history of stress-induced cardiomyopathy-LV function has normalized on most recent study.  5 Carotid artery disease-plan follow-up carotid Dopplers November 2021.  6 tobacco abuse-patient counseled on discontinuing.  Kirk Ruths, MD

## 2020-08-28 ENCOUNTER — Ambulatory Visit: Payer: PPO | Admitting: Cardiology

## 2020-08-28 ENCOUNTER — Encounter: Payer: Self-pay | Admitting: Cardiology

## 2020-08-28 ENCOUNTER — Other Ambulatory Visit: Payer: Self-pay

## 2020-08-28 VITALS — BP 134/62 | HR 53 | Ht 63.0 in | Wt 152.4 lb

## 2020-08-28 DIAGNOSIS — I480 Paroxysmal atrial fibrillation: Secondary | ICD-10-CM | POA: Diagnosis not present

## 2020-08-28 DIAGNOSIS — E7800 Pure hypercholesterolemia, unspecified: Secondary | ICD-10-CM

## 2020-08-28 DIAGNOSIS — I10 Essential (primary) hypertension: Secondary | ICD-10-CM

## 2020-08-28 NOTE — Patient Instructions (Signed)
Medication Instructions:  NO CHANGE *If you need a refill on your cardiac medications before your next appointment, please call your pharmacy*   Lab Work: Your physician recommends that you return for lab work FASTING If you have labs (blood work) drawn today and your tests are completely normal, you will receive your results only by: Marland Kitchen MyChart Message (if you have MyChart) OR . A paper copy in the mail If you have any lab test that is abnormal or we need to change your treatment, we will call you to review the results.   Follow-Up: At [facility], you and your health needs are our priority.  As part of our continuing mission to provide you with exceptional heart care, we have created designated Provider Care Teams.  These Care Teams include your primary Cardiologist (physician) and Advanced Practice Providers (APPs -  Physician Assistants and Nurse Practitioners) who all work together to provide you with the care you need, when you need it.  We recommend signing up for the patient portal called "MyChart".  Sign up information is provided on this After Visit Summary.  MyChart is used to connect with patients for Virtual Visits (Telemedicine).  Patients are able to view lab/test results, encounter notes, upcoming appointments, etc.  Non-urgent messages can be sent to your provider as well.   To learn more about what you can do with MyChart, go to [email or web address].    Your next appointment:   6 month(s)  The format for your next appointment:   In Person  Provider:   You may see Kirk Ruths, MD or one of the following Advanced Practice Providers on your designated Care Team:    Kerin Ransom, PA-C  [place], [place]  Coletta Memos, [place]

## 2020-08-30 DIAGNOSIS — E7800 Pure hypercholesterolemia, unspecified: Secondary | ICD-10-CM | POA: Diagnosis not present

## 2020-08-30 DIAGNOSIS — I480 Paroxysmal atrial fibrillation: Secondary | ICD-10-CM | POA: Diagnosis not present

## 2020-08-30 LAB — COMPREHENSIVE METABOLIC PANEL
ALT: 9 IU/L (ref 0–32)
AST: 16 IU/L (ref 0–40)
Albumin/Globulin Ratio: 1.1 — ABNORMAL LOW (ref 1.2–2.2)
Albumin: 3.7 g/dL (ref 3.7–4.7)
Alkaline Phosphatase: 99 IU/L (ref 48–121)
BUN/Creatinine Ratio: 27 (ref 12–28)
BUN: 35 mg/dL — ABNORMAL HIGH (ref 8–27)
Bilirubin Total: <0.2 mg/dL (ref 0.0–1.2)
CO2: 24 mmol/L (ref 20–29)
Calcium: 9.5 mg/dL (ref 8.7–10.3)
Chloride: 104 mmol/L (ref 96–106)
Creatinine, Ser: 1.28 mg/dL — ABNORMAL HIGH (ref 0.57–1.00)
GFR calc Af Amer: 46 mL/min/{1.73_m2} — ABNORMAL LOW (ref >59)
GFR calc non Af Amer: 40 mL/min/{1.73_m2} — ABNORMAL LOW (ref >59)
Globulin, Total: 3.5 g/dL (ref 1.5–4.5)
Glucose: 83 mg/dL (ref 65–99)
Potassium: 4.8 mmol/L (ref 3.5–5.2)
Sodium: 140 mmol/L (ref 134–144)
Total Protein: 7.2 g/dL (ref 6.0–8.5)

## 2020-08-30 LAB — LIPID PANEL
Chol/HDL Ratio: 4.4 ratio (ref 0.0–4.4)
Cholesterol, Total: 181 mg/dL (ref 100–199)
HDL: 41 mg/dL (ref >39)
LDL Chol Calc (NIH): 127 mg/dL — ABNORMAL HIGH (ref 0–99)
Triglycerides: 71 mg/dL (ref 0–149)
VLDL Cholesterol Cal: 13 mg/dL (ref 5–40)

## 2020-08-30 LAB — CBC
Hematocrit: 29.7 % — ABNORMAL LOW (ref 34.0–46.6)
Hemoglobin: 9.4 g/dL — ABNORMAL LOW (ref 11.1–15.9)
MCH: 25 pg — ABNORMAL LOW (ref 26.6–33.0)
MCHC: 31.6 g/dL (ref 31.5–35.7)
MCV: 79 fL (ref 79–97)
Platelets: 422 10*3/uL (ref 150–450)
RBC: 3.76 x10E6/uL — ABNORMAL LOW (ref 3.77–5.28)
RDW: 14.7 % (ref 11.7–15.4)
WBC: 9.3 10*3/uL (ref 3.4–10.8)

## 2020-08-31 ENCOUNTER — Telehealth: Payer: Self-pay | Admitting: *Deleted

## 2020-08-31 NOTE — Telephone Encounter (Addendum)
Left message for pt to call   ----- Message from Lelon Perla, MD sent at 08/31/2020  7:37 AM EDT ----- Pt anemic; hold xarelto; needs fu with primary care for anemia evaluation; low hgb may be contributing to fatigue. Kirk Ruths

## 2020-08-31 NOTE — Telephone Encounter (Signed)
Pt aware of results and agrees with plan ./cy

## 2020-08-31 NOTE — Telephone Encounter (Signed)
Patient returning call.

## 2020-09-01 NOTE — Telephone Encounter (Signed)
Spoke with pt, she has an appointment with her PCP Tuesday.

## 2020-09-01 NOTE — Telephone Encounter (Signed)
Patient returning call.

## 2020-09-05 ENCOUNTER — Telehealth: Payer: Self-pay | Admitting: Gastroenterology

## 2020-09-05 DIAGNOSIS — I480 Paroxysmal atrial fibrillation: Secondary | ICD-10-CM | POA: Diagnosis not present

## 2020-09-05 DIAGNOSIS — D649 Anemia, unspecified: Secondary | ICD-10-CM | POA: Diagnosis not present

## 2020-09-05 DIAGNOSIS — Z7901 Long term (current) use of anticoagulants: Secondary | ICD-10-CM | POA: Diagnosis not present

## 2020-09-06 ENCOUNTER — Ambulatory Visit: Payer: PPO | Admitting: Nurse Practitioner

## 2020-09-06 NOTE — Telephone Encounter (Signed)
[place] can you please contact the patient and ask if she has had any bleeding symptoms (dark or red blood in the stool?). She has a history of AVMs. Now on Xarelto for AF, she has a microcytic anemia, suspect iron deficiency. Repeat Hgb is 8.9 (in careeverywhere).   If she is having bleeding symptoms on Xarelto please let me know, she may need to go to the hospital. If stool is normal brown, would recommend starting ferrous sulfate 325mg  BID and repeating another CBC with TIBC / ferritin on Friday to ensure stable. Can you clarify if she is taking any NSAIDS? If so would stop. I think she is also on PPI and should continue that. If she has worsening SOB in the interim she will need to seek evaluation. Can you update me after you have spoken to her? Thanks

## 2020-09-06 NOTE — Telephone Encounter (Signed)
Spoke with patient, patient recently had lab work hemoglobin at a 9.4 on 08/30/20, pt states that since then her hemoglobin dropped to an 8 when she had it rechecked at her PCPs office, she reports feeling weak and SOB. Pt had to cancel appt today because she had a prior appt. She is scheduled to come in on 09/12/20 at 9:50 am, she states that she is not on any iron supplements, history of an AVM. Please advise on any other recommendations prior to appt, thank you

## 2020-09-06 NOTE — Telephone Encounter (Signed)
Lm on vm for patient to return call 

## 2020-09-07 ENCOUNTER — Other Ambulatory Visit: Payer: Self-pay

## 2020-09-07 DIAGNOSIS — D509 Iron deficiency anemia, unspecified: Secondary | ICD-10-CM

## 2020-09-07 NOTE — Telephone Encounter (Signed)
Lm on vm for patient to return call 

## 2020-09-07 NOTE — Telephone Encounter (Signed)
Lab order in epic.  

## 2020-09-07 NOTE — Telephone Encounter (Signed)
Spoke with patient, she denies any bleeding symptoms. Pt states that her Xarelto was stopped about a week ago. Pt states that she only takes Aleve as needed at night for her back pain, advised patient to discontinue that for now, advised that she can take Tylenol. Pt aware that she will need to get Ferrous sulfate 325 mg OTC and take it twice a day. Pt will come in for repeat labs tomorrow, she is aware that no appt is necessary and she can go by at her convenience between 7:30-5. Advised that if she has any worsening of SOB in the meantime she will need to be evaluated in the ED.

## 2020-09-07 NOTE — Telephone Encounter (Signed)
Great thank you [place] 

## 2020-09-08 ENCOUNTER — Other Ambulatory Visit (INDEPENDENT_AMBULATORY_CARE_PROVIDER_SITE_OTHER): Payer: PPO

## 2020-09-08 DIAGNOSIS — D509 Iron deficiency anemia, unspecified: Secondary | ICD-10-CM | POA: Diagnosis not present

## 2020-09-08 LAB — IBC + FERRITIN
Ferritin: 23.5 ng/mL (ref 10.0–291.0)
Iron: 99 ug/dL (ref 42–145)
Saturation Ratios: 22.8 % (ref 20.0–50.0)
Transferrin: 310 mg/dL (ref 212.0–360.0)

## 2020-09-08 LAB — CBC WITH DIFFERENTIAL/PLATELET
Basophils Absolute: 0.1 10*3/uL (ref 0.0–0.1)
Basophils Relative: 1.4 % (ref 0.0–3.0)
Eosinophils Absolute: 0.3 10*3/uL (ref 0.0–0.7)
Eosinophils Relative: 3.2 % (ref 0.0–5.0)
HCT: 28.4 % — ABNORMAL LOW (ref 36.0–46.0)
Hemoglobin: 9.1 g/dL — ABNORMAL LOW (ref 12.0–15.0)
Lymphocytes Relative: 24.8 % (ref 12.0–46.0)
Lymphs Abs: 2.4 10*3/uL (ref 0.7–4.0)
MCHC: 32 g/dL (ref 30.0–36.0)
MCV: 75.9 fl — ABNORMAL LOW (ref 78.0–100.0)
Monocytes Absolute: 0.6 10*3/uL (ref 0.1–1.0)
Monocytes Relative: 6.6 % (ref 3.0–12.0)
Neutro Abs: 6.2 10*3/uL (ref 1.4–7.7)
Neutrophils Relative %: 64 % (ref 43.0–77.0)
Platelets: 371 10*3/uL (ref 150.0–400.0)
RBC: 3.74 Mil/uL — ABNORMAL LOW (ref 3.87–5.11)
RDW: 17.3 % — ABNORMAL HIGH (ref 11.5–15.5)
WBC: 9.7 10*3/uL (ref 4.0–10.5)

## 2020-09-12 ENCOUNTER — Encounter: Payer: Self-pay | Admitting: Gastroenterology

## 2020-09-12 ENCOUNTER — Ambulatory Visit: Payer: PPO | Admitting: Gastroenterology

## 2020-09-12 ENCOUNTER — Other Ambulatory Visit (INDEPENDENT_AMBULATORY_CARE_PROVIDER_SITE_OTHER): Payer: PPO

## 2020-09-12 VITALS — BP 140/60 | HR 52 | Ht 63.0 in | Wt 155.0 lb

## 2020-09-12 DIAGNOSIS — F329 Major depressive disorder, single episode, unspecified: Secondary | ICD-10-CM

## 2020-09-12 DIAGNOSIS — R933 Abnormal findings on diagnostic imaging of other parts of digestive tract: Secondary | ICD-10-CM | POA: Diagnosis not present

## 2020-09-12 DIAGNOSIS — F32A Depression, unspecified: Secondary | ICD-10-CM

## 2020-09-12 DIAGNOSIS — D509 Iron deficiency anemia, unspecified: Secondary | ICD-10-CM

## 2020-09-12 DIAGNOSIS — Z7901 Long term (current) use of anticoagulants: Secondary | ICD-10-CM

## 2020-09-12 LAB — BASIC METABOLIC PANEL
BUN: 26 mg/dL — ABNORMAL HIGH (ref 6–23)
CO2: 27 mEq/L (ref 19–32)
Calcium: 9.3 mg/dL (ref 8.4–10.5)
Chloride: 103 mEq/L (ref 96–112)
Creatinine, Ser: 1.18 mg/dL (ref 0.40–1.20)
GFR: 44.11 mL/min — ABNORMAL LOW (ref >60.00)
Glucose, Bld: 97 mg/dL (ref 70–99)
Potassium: 3.8 mEq/L (ref 3.5–5.1)
Sodium: 139 mEq/L (ref 135–145)

## 2020-09-12 LAB — HEMOGLOBIN: Hemoglobin: 9.3 g/dL — ABNORMAL LOW (ref 12.0–15.0)

## 2020-09-12 MED ORDER — OMEPRAZOLE 20 MG PO CPDR: 40.0000 mg | DELAYED_RELEASE_CAPSULE | Freq: Two times a day (BID) | ORAL | 5 refills | Status: DC

## 2020-09-12 MED ORDER — POLYETHYLENE GLYCOL 3350 17 G PO PACK: PACK | ORAL | 0 refills | Status: AC

## 2020-09-12 NOTE — Progress Notes (Signed)
HPI :  79 year old female here for follow-up visit for anemia.  Recall that in 2016 she developed a microcytic anemia in the setting of Xarelto use for A. fib.  She required if units of blood transfusion but never had overt bleeding.  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  She had 1 diminutive AVM in the stomach.  She was placed on iron and her hemoglobin had improved.  She had been doing okay, recall that in December 2020 she went to the emergency room with nausea vomiting and abdominal pain associated with poor p.o. intake.  She had an acute kidney injury on labs CT scan was performed which question small bowel thickening in the fourth portion of the duodenum.  The point time she was taking Aleve as needed.  She was given Carafate and was placed on omeprazole 40 mg twice daily.  At that point in time had a follow-up visit we had discussed a potential enteroscopy to reevaluate the CT findings however she developed a severe pneumonia in May which is complicated by A. fib with RVR.  She had a prolonged recovery from that pneumonia.  She ultimately declined endoscopic evaluation of the CT scan findings.  Her hemoglobin last May was normal, 11.9.  She had a recheck of her hemoglobin in September and her hemoglobin had dropped to nines. Follow up labs as below. Her Hgb started rising after she started oral iron again.  She denies any blood in her stools prior to onset of anemia.  She states her stool was normal brown and she did not have any problems with her digestive tract.  Since starting iron she has been taking ferrous sulfate 325 twice daily and she has dark stools without any blood.  She has been taking omeprazole 20 mg twice daily.  She denies any abdominal pains but does have some decreased appetite.  Her son helps make her meals.  Her weight is stable.  In fact she is gained 3 pounds lately.  Her primary care had told her to stop her  Xarelto for the past 10 to 14 days or so.  She has a history of A. fib but she is never had a stroke.  Prior to onset of her anemia she was taking Advil 2-3 times per week for joint/back pain.  Since have spoken with her she has completely stopped the NSAIDs.  Using Tylenol as needed but this does not really help her back as much.  She has had decreased energy lately.  She states she has recovered from her pneumonia.  She continues to smoke tobacco and does not require any oxygen.  Otherwise patient appears a bit down and feeling depressed lately about all of her medical issues.  She lives with her son and needs help with her meals etc.  She is taking Zoloft for depression, not sure how much is helping right now.  Recent labs: 05/17/20 Hgb 11.9 08/30/20 - Hgb 9.4 - MCV 79 - BUN 35  09/05/20 - Hgb 8.9, MCV 78 - iron level of 19, TIBC 366, iron sat 5% 09/08/20 - Hgb 9.1, HCV 75.9, iron 99, iron sat 22%, ferritin 23.5  He had an echo in May showing EF of 50 to 55%.    12/27/19 CTAP w/ contrast IMPRESSION: 1. Questionable wall thickening with adjacent edema about the fourth portion of the duodenum, suggesting duodenitis. 2. Colonic diverticulosis without acute inflammation. 3. Stable bilateral  renal cysts, including hemorrhagic cyst previously characterized by MRI   EGD 12/26/2015 - ENDOSCOPIC IMPRESSION: Normal esophagus 64mm gastric polyp removed via snare, 3-67mm gastric polyp removed via forceps Diminutive gastric AVM of likely no clinical significance, it was not intervened upon Normal duodenum  Colonoscopy 12/26/2015 - Two moderate sized AVMs were appreciated in the cecum without stigmata of bleeding. They were both injected with epinephrine (total 2.5cc) and then ablated using APC with good result and no evidence of bleeding. A 23mm sessile polyp was noted in the cecum and removed with cold forceps. Mild diverticulosis was noted in the sigmoid colon. Multiple diminutive  benign appearing hyperplastic polyps were noted in the rectum and sigmoid colon. The terminal ileum was normal. The colon was spastic and glucagon was used to treat spasm during the procedure. The remainder of the examined colon was normal. Retroflexed views revealed internal hemorrhoids.   Past Medical History:  Diagnosis Date  . Anemia    microcytic   . Arthritis   . Atrial fibrillation ([place])   . Cataract    REMOVED BILATERAL  . Diabetes mellitus ([place])   . Diverticulosis   . Duodenitis   . Fibromyalgia   . Gastric polyp   . Gastroparesis   . Hyperlipidemia   . Hypertension   . Kidney stones   . Melanoma ([place])   . Microcytic anemia   . PMR (polymyalgia rheumatica) ([facility])   . PVD (peripheral vascular disease) ([place])   . Renal cyst   . Tubular adenoma of colon      Past Surgical History:  Procedure Laterality Date  . APPENDECTOMY    . BREAST BIOPSY    . CARDIOVERSION N/A 06/01/2020   Procedure: CARDIOVERSION;  Surgeon: Lelon Perla, MD;  Location: [facility] ENDOSCOPY;  Service: Cardiovascular;  Laterality: N/A;  . CESAREAN SECTION    . COLONOSCOPY    . COLONOSCOPY WITH PROPOFOL N/A 12/26/2015   Procedure: COLONOSCOPY WITH PROPOFOL;  Surgeon: Manus Gunning, MD;  Location: [facility] ENDOSCOPY;  Service: Gastroenterology;  Laterality: N/A;  . ESOPHAGOGASTRODUODENOSCOPY (EGD) WITH PROPOFOL N/A 12/26/2015   Procedure: ESOPHAGOGASTRODUODENOSCOPY (EGD) WITH PROPOFOL;  Surgeon: Manus Gunning, MD;  Location: [facility] ENDOSCOPY;  Service: Gastroenterology;  Laterality: N/A;  . LEFT HEART CATH AND CORONARY ANGIOGRAPHY N/A 06/03/2017   Procedure: Left Heart Cath and Coronary Angiography;  Surgeon: Martinique, Peter M, MD;  Location: [facility] CV LAB;  Service: Cardiovascular;  Laterality: N/A;  . POLYPECTOMY     Family History  Problem Relation Age of Onset  . CAD Father        MI and CABG  . Diabetes Father   . Heart disease Father   . Colon cancer Mother 36   Social History    Tobacco Use  . Smoking status: Former Smoker    Packs/day: 1.00    Years: 60.00    Pack years: 60.00    Types: Cigarettes  . Smokeless tobacco: Never Used  Vaping Use  . Vaping Use: Never used  Substance Use Topics  . Alcohol use: No    Alcohol/week: 0.0 standard drinks  . Drug use: No   Current Outpatient Medications  Medication Sig Dispense Refill  . acetaminophen (TYLENOL) 650 MG CR tablet Take 1,300 mg by mouth every 8 (eight) hours as needed for pain.    Marland Kitchen albuterol (VENTOLIN HFA) 108 (90 Base) MCG/ACT inhaler Inhale 2 puffs into the lungs every 4 (four) hours as needed for wheezing.     . B-D UF  III MINI PEN NEEDLES 31G X 5 MM MISC USE WITH LANTUS EVERY NIGHT AT BEDTIME 100 each 3  . diltiazem (CARDIZEM CD) 120 MG 24 hr capsule Take 1 capsule (120 mg total) by mouth at bedtime. 90 capsule 3  . ferrous sulfate 325 (65 FE) MG EC tablet Take 325 mg by mouth 3 (three) times daily with meals.    . gabapentin (NEURONTIN) 100 MG capsule Take 1 capsule (100 mg total) by mouth at bedtime.    Marland Kitchen glucose blood (ONETOUCH ULTRA) test strip Use to test blood sugars 2 times daily. 100 each 12  . insulin glargine (LANTUS SOLOSTAR) 100 UNIT/ML Solostar Pen Inject 20 Units into the skin daily.    . metoprolol tartrate (LOPRESSOR) 25 MG tablet Take 1 tablet (25 mg total) by mouth 2 (two) times daily. 180 tablet 3  . Multiple Vitamins-Minerals (CENTRUM SILVER 50+WOMEN PO) Take 1 tablet by mouth daily.    . nitroGLYCERIN (NITROSTAT) 0.3 MG SL tablet Place 1 tablet (0.3 mg total) under the tongue every 5 (five) minutes as needed for chest pain. 100 tablet 1  . omeprazole (PRILOSEC) 20 MG capsule Take 2 capsules (40 mg total) by mouth 2 (two) times daily. KEEP 06/01/20 OFFICE VISIT FOR FURTHER REFILLS (Patient taking differently: Take 20 mg by mouth 2 (two) times daily. ) 120 capsule 1  . Rivaroxaban (XARELTO) 15 MG TABS tablet Take 1 tablet (15 mg total) by mouth daily with supper. 30 tablet 1  .  sertraline (ZOLOFT) 50 MG tablet Take 50 mg by mouth daily.     . vitamin B-12 (CYANOCOBALAMIN) 100 MCG tablet Take 100 mcg by mouth daily.    Marland Kitchen VITAMIN D PO Take 1 capsule by mouth daily.    Marland Kitchen alendronate (FOSAMAX) 70 MG tablet Take 70 mg by mouth once a week. Sunday (Patient not taking: Reported on 09/12/2020)     No current facility-administered medications for this visit.   Allergies  Allergen Reactions  . Anoro Ellipta [Umeclidinium-Vilanterol] Palpitations  . Losartan Shortness Of Breath    wheezing wheezing  . Norvasc [Amlodipine Besylate] Swelling  . Pregabalin Swelling    TO LOWER EXTREMTIES TO LOWER EXTREMTIES  . Bupropion     Other reaction(s): Other Sucidal thoughts  . Eliquis [Apixaban] Nausea And Vomiting    Severe stomach pain  . Metoclopramide Other (See Comments)    Tardive dyskinesia, tremors  . Lisinopril Cough  . Metformin Diarrhea  . Metformin And Related Diarrhea     Review of Systems: All systems reviewed and negative except where noted in HPI.    Physical Exam: BP 140/60   Pulse (!) 52   Ht 5\' 3"  (1.6 m)   Wt 155 lb (70.3 kg)   BMI 27.46 kg/m  Constitutional: Pleasant,well-developed, female in no acute distress. Cardiovascular: Normal rate, regular rhythm.  Pulmonary/chest: Effort normal and breath sounds normal. No crackles / rhonchi Abdominal: Soft, nondistended, nontender. .  Extremities: no edema Lymphadenopathy: No cervical adenopathy noted. Neurological: Alert and oriented to person place and time. Skin: Skin is warm and dry. No rashes noted. Psychiatric: Normal mood and affect. Behavior is normal.   ASSESSMENT AND PLAN: 79 year old female with multiple medical problems here for reassessment of the following:  Iron deficiency anemia / anticoagulated - patient with recurrent microcytic anemia with a low iron sat on initial labs in September in the setting of anticoagulation.  She had this occur back in 2016, noted to have colonic and  gastric AVMs.  Responded well to ablation of the colonic AVMs and oral iron.  Has been off iron and now having recurrent anemia.  I discussed differential diagnosis with her, unclear if AVMs are causing this for something else.  She did have duodenitis on recent CT scan last year, we had discussed performing a follow-up enteroscopy to ensure no evidence of malignancy or concerning process, she had declined at that time due to other medical problems.  She has been taking periodic NSAIDs and I have asked her to stop all NSAIDs.  We discussed if she wanted endoscopic evaluation for this.  I discussed risk benefits of endoscopy and anesthesia with her.  Following this discussion, in light of her other medical problems she really does not want to pursue any endoscopic evaluation unless she has worsening anemia that is refractory to iron supplementation.  She understands the risk of potentially not diagnosing malignancy or other process which could lead to worsening of her health.  Her hemoglobin has stabilized since starting oral iron.  Will repeat hemoglobin today to ensure appropriate uptrend.  We will also check her BUN and creatinine given this was mildly elevated previously.  We will plan on continuing her ferrous sulfate, she will increase this to 3 times daily if she tolerates it, can use MiraLAX as needed for constipation when she does this.  Will empirically increase her omeprazole to 40 mg twice daily for now, over time will dose reduce if she stable.  We otherwise discussed when we should resume the Xarelto.  If her hemoglobin has been stable now for a few weeks we could consider cautiously resuming Xarelto and observe her hemoglobin closely.  We discussed the risks of stroke while holding the Xarelto and this must be weighed against risk of worsening of anemia while on it.  Will await her follow-up hemoglobin and we will consider resuming that in the near future if she is stable.  Otherwise if she has any  bleeding symptoms she is to contact us, or worsening of her fatigue.  She understands there is risk of worsening anemia without endoscopic evaluation.  Abnormal CT scan / duodenitis - as above, have offered endoscopic evaluation of this abnormality, she is declining.  Depression - on Zoloft but still having some issues with depression and this is an ongoing issue for her.  Recommend she touch base with her primary care to discuss plan to treat this moving forward.  Micro Cellar, MD [facility]

## 2020-09-12 NOTE — Patient Instructions (Addendum)
If you are age 79 or older, your body mass index should be between 23-30. Your Body mass index is 27.46 kg/m. If this is out of the aforementioned range listed, please consider follow up with your Primary Care Provider.  If you are age 64 or younger, your body mass index should be between 19-25. Your Body mass index is 27.46 kg/m. If this is out of the aformentioned range listed, please consider follow up with your Primary Care Provider.    Please go to the lab in the basement of our building to have lab work done as you leave today. Hit "B" for basement when you get on the elevator.  When the doors open the lab is on your left.  We will call you with the results. Thank you.  Due to recent changes in healthcare laws, you may see the results of your imaging and laboratory studies on MyChart before your provider has had a chance to review them.  We understand that in some cases there may be results that are confusing or concerning to you. Not all laboratory results come back in the same time frame and the provider may be waiting for multiple results in order to interpret others.  Please give [place] 48 hours in order for your provider to thoroughly review all the results before contacting the office for clarification of your results.   Increase your omeprazole 20 mg to 2 tablets (40 mg) twice a day.  Increase your ferrous sulfate to 3 times a day.  As discussed avoid all NSAIDs.    Use Miralax, as needed.  Thank you for entrusting me with your care and for choosing [facility], Dr. [place] Cellar

## 2020-09-13 ENCOUNTER — Other Ambulatory Visit: Payer: Self-pay

## 2020-09-13 DIAGNOSIS — D509 Iron deficiency anemia, unspecified: Secondary | ICD-10-CM

## 2020-09-14 ENCOUNTER — Telehealth: Payer: Self-pay | Admitting: Cardiology

## 2020-09-14 DIAGNOSIS — I480 Paroxysmal atrial fibrillation: Secondary | ICD-10-CM

## 2020-09-14 NOTE — Telephone Encounter (Signed)
Patient is requesting to speak with Dr. [name] nurse. She would like to know when to begin taking Rivaroxaban (XARELTO) 15 MG TABS tablet again. Please advise.

## 2020-09-14 NOTE — Telephone Encounter (Signed)
Spoke to patient stated her last hgb 9.3.She wanted to ask Dr.Crenshaw when it would be safe to restart Xarelto.Advised Dr.Crenshaw out of office this week.I will send message to him for advice.

## 2020-09-18 ENCOUNTER — Other Ambulatory Visit: Payer: Self-pay | Admitting: Family Medicine

## 2020-09-18 NOTE — Telephone Encounter (Signed)
Follow Up:      Please call her, concerning her Xarelto.

## 2020-09-18 NOTE — Telephone Encounter (Signed)
Would repeat CBC in 4 weeks.  If hemoglobin improving will resume Xarelto then. Kirk Ruths

## 2020-09-18 NOTE — Telephone Encounter (Signed)
Spoke with pt, Lab orders mailed to the pt  

## 2020-09-19 NOTE — Telephone Encounter (Signed)
Spoke with patient today, she states that she called Dr. Stanford Breed for opinion on holding Xarelto, they advised her to hold Xarelto for one month, she wanted to make you aware. She is coming in tomorrow to recheck hemoglobin, Dr. [name] office has also placed an order for a CBC in a month.

## 2020-09-19 NOTE — Telephone Encounter (Signed)
Okay thank you. Please note I am out of the office tomorrow if you can keep an eye out for her Hgb, and if any worsening she needs to be notified. Thanks

## 2020-09-19 NOTE — Telephone Encounter (Signed)
Patient called said she spoke with Dr. Stanford Breed and he advised her to stop the blood thinner for a month

## 2020-09-20 ENCOUNTER — Telehealth: Payer: Self-pay

## 2020-09-20 ENCOUNTER — Other Ambulatory Visit (INDEPENDENT_AMBULATORY_CARE_PROVIDER_SITE_OTHER): Payer: PPO

## 2020-09-20 DIAGNOSIS — D509 Iron deficiency anemia, unspecified: Secondary | ICD-10-CM

## 2020-09-20 LAB — HEMOGLOBIN: Hemoglobin: 10.3 g/dL — ABNORMAL LOW (ref 12.0–15.0)

## 2020-09-20 NOTE — Telephone Encounter (Signed)
Spoke with patient, she states that she will be in later this afternoon for labs.

## 2020-09-20 NOTE — Telephone Encounter (Signed)
-----   Message from Yevette Edwards, RN sent at 09/13/2020  1:23 PM EDT ----- Regarding: Labs Repeat Hemoglobin, order in epic

## 2020-09-20 NOTE — Telephone Encounter (Signed)
Spoke with patient to remind her that she is due for repeat labs today, pt states that she will be by the lab later today

## 2020-09-21 ENCOUNTER — Other Ambulatory Visit: Payer: Self-pay

## 2020-09-21 ENCOUNTER — Telehealth: Payer: Self-pay | Admitting: Gastroenterology

## 2020-09-21 DIAGNOSIS — D509 Iron deficiency anemia, unspecified: Secondary | ICD-10-CM

## 2020-09-21 NOTE — Telephone Encounter (Signed)
Pt is returning phone call from [facility]

## 2020-09-21 NOTE — Telephone Encounter (Signed)
Spoke with patient regarding lab results from 09/20/20, see result note for more information

## 2020-09-21 NOTE — Telephone Encounter (Signed)
Spoke with patient, advised that Dr. Stanford Breed can see her labs and that I would route it to him to bring it to his attention.

## 2020-09-25 DIAGNOSIS — F5101 Primary insomnia: Secondary | ICD-10-CM | POA: Diagnosis not present

## 2020-09-25 DIAGNOSIS — M79604 Pain in right leg: Secondary | ICD-10-CM | POA: Diagnosis not present

## 2020-09-25 DIAGNOSIS — G8929 Other chronic pain: Secondary | ICD-10-CM | POA: Diagnosis not present

## 2020-09-25 DIAGNOSIS — M545 Low back pain: Secondary | ICD-10-CM | POA: Diagnosis not present

## 2020-09-25 DIAGNOSIS — M79605 Pain in left leg: Secondary | ICD-10-CM | POA: Diagnosis not present

## 2020-10-02 ENCOUNTER — Telehealth: Payer: Self-pay | Admitting: Cardiology

## 2020-10-02 NOTE — Telephone Encounter (Signed)
° ° °  Pt would like to speak Hilda Blades, she only wanted to speak with RN Hilda Blades

## 2020-10-02 NOTE — Telephone Encounter (Signed)
Spoke with pt, questions regarding lab work answered. °

## 2020-10-10 ENCOUNTER — Telehealth: Payer: Self-pay

## 2020-10-10 NOTE — Telephone Encounter (Signed)
-----   Message from Yevette Edwards, RN sent at 09/21/2020  9:18 AM EDT ----- Regarding: Labs Repeat Hg, order in epic

## 2020-10-10 NOTE — Telephone Encounter (Signed)
Left detailed message reminding patient that she is due for repeat labs. Advised that no appt is necessary and that she can go by the lab at her convenience between 7:30 AM-5 PM, Monday through Friday. Advised that she can give me a call if she has any questions.

## 2020-10-10 NOTE — Telephone Encounter (Signed)
Spoke with patient, she states that Dr. Stanford Breed wanted to draw the same labs. Pt states that she has several things to do tomorrow and will have labs drawn wherever she is closest to. She states that she will give me a call and let me know where she decided to have the labs drawn at. Advised that if she has them done at Dr. [name] office I will keep an eye out so that I can make Dr. Havery Moros aware of the results. Advised patient that I wanted to go wherever it was convenient for her. Pt verbalized understanding and will contact me tomorrow.

## 2020-10-11 ENCOUNTER — Other Ambulatory Visit (INDEPENDENT_AMBULATORY_CARE_PROVIDER_SITE_OTHER): Payer: PPO

## 2020-10-11 DIAGNOSIS — D509 Iron deficiency anemia, unspecified: Secondary | ICD-10-CM | POA: Diagnosis not present

## 2020-10-11 LAB — HEMOGLOBIN: Hemoglobin: 11.9 g/dL — ABNORMAL LOW (ref 12.0–15.0)

## 2020-10-12 ENCOUNTER — Telehealth: Payer: Self-pay | Admitting: Cardiology

## 2020-10-12 NOTE — Telephone Encounter (Signed)
Agree with ov Kirk Ruths

## 2020-10-12 NOTE — Telephone Encounter (Signed)
Spoke with pt, she reports she continues to feel bad even though her blood count is better. She is going to be moving out of [place] and wanted to see dr Stanford Breed before she leaves. Follow up scheduled She reports SOB with exertion within the home. She has swelling in her legs and a little in her feet. No orthopnea.her weight is up from 151 yesterday to 153 today. Average bp 120-130. Will forward for dr Stanford Breed review

## 2020-10-12 NOTE — Telephone Encounter (Signed)
Patient wanted to let Hilda Blades know that she had her blood drawn at [facility] and her hemoglobin level was 11.9. She states that they are faxing over results to Dr. Stanford Breed.

## 2020-10-13 NOTE — Telephone Encounter (Signed)
Left message for patient with Dr Creshaw's recommendations.   

## 2020-10-16 NOTE — Telephone Encounter (Signed)
Resume xarelto 15 mg daily; cbc 4 weeks Kirk Ruths

## 2020-10-16 NOTE — Telephone Encounter (Signed)
Patient is calling to follow up. She would like to know whether or not she can begin taking Xarelto again. Please advise.

## 2020-10-16 NOTE — Telephone Encounter (Signed)
Advised patient, verbalized understanding. She will discuss CBC at follow up with Dr Stanford Breed 11/8

## 2020-10-30 NOTE — Progress Notes (Signed)
HPI: FU atrial fibrillation. Abdominal ultrasound May 2016 showed no aneurysm. Patient admitted with non-ST elevation myocardial infarction in June 2018. Cardiac catheterization revealed 20% proximal to mid LAD and normal left ventricular end-diastolic pressure. Echocardiogram showed ejection fraction 33-54%, grade 2 diastolic dysfunction, mild tricuspid regurgitation, severely elevated pulmonary pressure. Sludge noted LV apex. 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 Was noted to have intermittent atrial fibrillation;felt to be secondary to stress of sepsis.Carotid Dopplers November 2020showed 1 to 39% bilateral stenosis.Echocardiogram May 2021 showed normal LV function. Patient developed recurrent atrial fibrillation in the setting of pneumonia. Patient had elective cardioversion June 2021.  Sincelast seen,  some dyspnea on exertion but no orthopnea or PND.  She has bilateral lower extremity edema right greater than left.  She denies chest pain, bleeding or syncope.  Current Outpatient Medications  Medication Sig Dispense Refill  . acetaminophen (TYLENOL) 650 MG CR tablet Take 1,300 mg by mouth every 8 (eight) hours as needed for pain.    Marland Kitchen albuterol (VENTOLIN HFA) 108 (90 Base) MCG/ACT inhaler Inhale 2 puffs into the lungs every 4 (four) hours as needed for wheezing.     Marland Kitchen alendronate (FOSAMAX) 70 MG tablet Take 70 mg by mouth once a week. Sunday    . B-D UF III MINI PEN NEEDLES 31G X 5 MM MISC USE WITH LANTUS EVERY NIGHT AT BEDTIME 100 each 3  . diltiazem (CARDIZEM CD) 120 MG 24 hr capsule Take 1 capsule (120 mg total) by mouth at bedtime. 90 capsule 3  . ferrous sulfate 325 (65 FE) MG EC tablet Take 325 mg by mouth 3 (three) times daily with meals.    . gabapentin (NEURONTIN) 100 MG capsule Take 1 capsule (100 mg total) by mouth at bedtime.    Marland Kitchen glucose blood (ONETOUCH ULTRA) test strip Use to test blood sugars 2 times  daily. 100 each 12  . insulin glargine (LANTUS SOLOSTAR) 100 UNIT/ML Solostar Pen Inject 20 Units into the skin daily.    . metoprolol tartrate (LOPRESSOR) 25 MG tablet Take 1 tablet (25 mg total) by mouth 2 (two) times daily. 180 tablet 3  . Multiple Vitamins-Minerals (CENTRUM SILVER 50+WOMEN PO) Take 1 tablet by mouth daily.    . nitroGLYCERIN (NITROSTAT) 0.3 MG SL tablet Place 1 tablet (0.3 mg total) under the tongue every 5 (five) minutes as needed for chest pain. 100 tablet 1  . omeprazole (PRILOSEC) 20 MG capsule Take 2 capsules (40 mg total) by mouth 2 (two) times daily. 120 capsule 5  . polyethylene glycol (MIRALAX) 17 g packet Use as directed as needed 14 each 0  . Rivaroxaban (XARELTO) 15 MG TABS tablet Take 1 tablet (15 mg total) by mouth daily with supper. 30 tablet 1  . sertraline (ZOLOFT) 50 MG tablet Take 50 mg by mouth daily.     . vitamin B-12 (CYANOCOBALAMIN) 100 MCG tablet Take 100 mcg by mouth daily.    Marland Kitchen VITAMIN D PO Take 1 capsule by mouth daily.     No current facility-administered medications for this visit.     Past Medical History:  Diagnosis Date  . Anemia    microcytic   . Arthritis   . Atrial fibrillation ([place])   . Cataract    REMOVED BILATERAL  . Diabetes mellitus ([place])   . Diverticulosis   . Duodenitis   . Fibromyalgia   . Gastric polyp   . Gastroparesis   . Hyperlipidemia   .  Hypertension   . Kidney stones   . Melanoma ([place])   . Microcytic anemia   . PMR (polymyalgia rheumatica) ([facility])   . PVD (peripheral vascular disease) ([place])   . Renal cyst   . Tubular adenoma of colon     Past Surgical History:  Procedure Laterality Date  . APPENDECTOMY    . BREAST BIOPSY    . CARDIOVERSION N/A 06/01/2020   Procedure: CARDIOVERSION;  Surgeon: Lelon Perla, MD;  Location: [facility] ENDOSCOPY;  Service: Cardiovascular;  Laterality: N/A;  . CESAREAN SECTION    . COLONOSCOPY    . COLONOSCOPY WITH PROPOFOL N/A 12/26/2015   Procedure: COLONOSCOPY WITH  PROPOFOL;  Surgeon: Manus Gunning, MD;  Location: [facility] ENDOSCOPY;  Service: Gastroenterology;  Laterality: N/A;  . ESOPHAGOGASTRODUODENOSCOPY (EGD) WITH PROPOFOL N/A 12/26/2015   Procedure: ESOPHAGOGASTRODUODENOSCOPY (EGD) WITH PROPOFOL;  Surgeon: Manus Gunning, MD;  Location: [facility] ENDOSCOPY;  Service: Gastroenterology;  Laterality: N/A;  . LEFT HEART CATH AND CORONARY ANGIOGRAPHY N/A 06/03/2017   Procedure: Left Heart Cath and Coronary Angiography;  Surgeon: Martinique, Peter M, MD;  Location: [facility];  Service: Cardiovascular;  Laterality: N/A;  . POLYPECTOMY      Social History   Socioeconomic History  . Marital status: Widowed    Spouse name: Not on file  . Number of children: 1  . Years of education: Not on file  . Highest education level: Not on file  Occupational History  . Not on file  Tobacco Use  . Smoking status: Former Smoker    Packs/day: 1.00    Years: 60.00    Pack years: 60.00    Types: Cigarettes  . Smokeless tobacco: Never Used  Vaping Use  . Vaping Use: Never used  Substance and Sexual Activity  . Alcohol use: No    Alcohol/week: 0.0 standard drinks  . Drug use: No  . Sexual activity: Not on file  Other Topics Concern  . Not on file  Social History Narrative  . Not on file   Social Determinants of Health   Financial Resource Strain:   . Difficulty of Paying Living Expenses: Not on file  Food Insecurity:   . Worried About Charity fundraiser in the Last Year: Not on file  . Ran Out of Food in the Last Year: Not on file  Transportation Needs:   . Lack of Transportation (Medical): Not on file  . Lack of Transportation (Non-Medical): Not on file  Physical Activity:   . Days of Exercise per Week: Not on file  . Minutes of Exercise per Session: Not on file  Stress:   . Feeling of Stress : Not on file  Social Connections:   . Frequency of Communication with Friends and Family: Not on file  . Frequency of Social Gatherings with  Friends and Family: Not on file  . Attends Religious Services: Not on file  . Active Member of Clubs or Organizations: Not on file  . Attends Archivist Meetings: Not on file  . Marital Status: Not on file  Intimate Partner Violence:   . Fear of Current or Ex-Partner: Not on file  . Emotionally Abused: Not on file  . Physically Abused: Not on file  . Sexually Abused: Not on file    Family History  Problem Relation Age of Onset  . CAD Father        MI and CABG  . Diabetes Father   . Heart disease Father   .  Colon cancer Mother 65    ROS: Back and lower extremity pain but no fevers or chills, productive cough, hemoptysis, dysphasia, odynophagia, melena, hematochezia, dysuria, hematuria, rash, seizure activity, orthopnea, PND, claudication. Remaining systems are negative.  Physical Exam: Well-developed well-nourished in no acute distress.  Skin is warm and dry.  HEENT is normal.  Neck is supple.  Chest is clear to auscultation with normal expansion.  Cardiovascular exam is regular rate and rhythm.  2/6 systolic ejection murmur Abdominal exam nontender or distended. No masses palpated. Extremities show 1+ edema. neuro grossly intact  ECG-sinus bradycardia at a rate of 48, no ST changes.  Personally reviewed  A/P  1 paroxysmal atrial fibrillation-patient is in sinus rhythm today on examination.  However she is bradycardic and is complaining of some degree of fatigue.  We will continue Cardizem for rate control if atrial fibrillation recurs but discontinue metoprolol.  Continue Xarelto.  2 history of stress-induced cardiomyopathy-LV function has improved on most recent echocardiogram.  3 hypertension-blood pressure controlled.  Continue present medical regimen.  4 hyperlipidemia-intolerant to statins.  5 carotid artery disease-we will arrange follow-up carotid Dopplers.  6 tobacco abuse-patient again counseled on discontinuing.  7 lower extremity pain-she  describes some lower extremity pain and edema.  We will treat with Lasix 20 mg daily.  If symptoms persist may need to consider ABIs with Doppler.  Kirk Ruths, MD

## 2020-11-06 ENCOUNTER — Other Ambulatory Visit: Payer: Self-pay

## 2020-11-06 ENCOUNTER — Ambulatory Visit: Payer: PPO | Admitting: Cardiology

## 2020-11-06 ENCOUNTER — Encounter: Payer: Self-pay | Admitting: Cardiology

## 2020-11-06 VITALS — BP 130/71 | HR 48 | Temp 97.0°F | Ht 64.0 in | Wt 157.2 lb

## 2020-11-06 DIAGNOSIS — I480 Paroxysmal atrial fibrillation: Secondary | ICD-10-CM | POA: Diagnosis not present

## 2020-11-06 DIAGNOSIS — E7800 Pure hypercholesterolemia, unspecified: Secondary | ICD-10-CM

## 2020-11-06 DIAGNOSIS — I10 Essential (primary) hypertension: Secondary | ICD-10-CM | POA: Diagnosis not present

## 2020-11-06 DIAGNOSIS — I6523 Occlusion and stenosis of bilateral carotid arteries: Secondary | ICD-10-CM

## 2020-11-06 MED ORDER — FUROSEMIDE 20 MG PO TABS: 20.0000 mg | ORAL_TABLET | Freq: Every day | ORAL | 3 refills | Status: DC

## 2020-11-06 NOTE — Patient Instructions (Signed)
Medication Instructions:   STOP METOPROLOL  *If you need a refill on your cardiac medications before your next appointment, please call your pharmacy*   Lab Work:  Your physician recommends that you return for lab work in: [place]  If you have labs (blood work) drawn today and your tests are completely normal, you will receive your results only by: Marland Kitchen MyChart Message (if you have MyChart) OR . A paper copy in the mail If you have any lab test that is abnormal or we need to change your treatment, we will call you to review the results.   Testing/Procedures:  Your physician has requested that you have a carotid duplex. This test is an ultrasound of the carotid arteries in your neck. It looks at blood flow through these arteries that supply the brain with blood. Allow one hour for this exam. There are no restrictions or special instructions.[facility]     Follow-Up: At [facility], you and your health needs are our priority.  As part of our continuing mission to provide you with exceptional heart care, we have created designated Provider Care Teams.  These Care Teams include your primary Cardiologist (physician) and Advanced Practice Providers (APPs -  Physician Assistants and Nurse Practitioners) who all work together to provide you with the care you need, when you need it.  We recommend signing up for the patient portal called "MyChart".  Sign up information is provided on this After Visit Summary.  MyChart is used to connect with patients for Virtual Visits (Telemedicine).  Patients are able to view lab/test results, encounter notes, upcoming appointments, etc.  Non-urgent messages can be sent to your provider as well.   To learn more about what you can do with MyChart, go to [email or web address].    Your next appointment:    AS NEEDED

## 2020-11-08 DIAGNOSIS — I480 Paroxysmal atrial fibrillation: Secondary | ICD-10-CM | POA: Diagnosis not present

## 2020-11-08 DIAGNOSIS — E782 Mixed hyperlipidemia: Secondary | ICD-10-CM | POA: Diagnosis not present

## 2020-11-08 DIAGNOSIS — Z794 Long term (current) use of insulin: Secondary | ICD-10-CM | POA: Diagnosis not present

## 2020-11-08 DIAGNOSIS — E1169 Type 2 diabetes mellitus with other specified complication: Secondary | ICD-10-CM | POA: Diagnosis not present

## 2020-11-08 DIAGNOSIS — N184 Chronic kidney disease, stage 4 (severe): Secondary | ICD-10-CM | POA: Diagnosis not present

## 2020-11-08 DIAGNOSIS — Z789 Other specified health status: Secondary | ICD-10-CM | POA: Diagnosis not present

## 2020-11-08 DIAGNOSIS — I152 Hypertension secondary to endocrine disorders: Secondary | ICD-10-CM | POA: Diagnosis not present

## 2020-11-08 DIAGNOSIS — E1159 Type 2 diabetes mellitus with other circulatory complications: Secondary | ICD-10-CM | POA: Diagnosis not present

## 2020-11-08 DIAGNOSIS — M778 Other enthesopathies, not elsewhere classified: Secondary | ICD-10-CM | POA: Diagnosis not present

## 2020-11-09 ENCOUNTER — Other Ambulatory Visit: Payer: Self-pay | Admitting: Cardiology

## 2020-11-09 NOTE — Telephone Encounter (Signed)
66f 71.3kg Scr 1.18 09/12/20 ccr 43.5 Lovw/crenshaw 11/06/20 Pt requesting refil for xarelto 30mg  but only qualifies for 15mg  based on ccr of 43.5. will route to pharmd pool for review

## 2020-11-10 ENCOUNTER — Other Ambulatory Visit: Payer: Self-pay | Admitting: Pharmacist Clinician (PhC)/ Clinical Pharmacy Specialist

## 2020-11-10 ENCOUNTER — Inpatient Hospital Stay (HOSPITAL_COMMUNITY): Admission: RE | Admit: 2020-11-10 | Payer: PPO | Source: Ambulatory Visit

## 2020-11-10 MED ORDER — RIVAROXABAN 15 MG PO TABS
15.0000 mg | ORAL_TABLET | Freq: Every day | ORAL | 1 refills | Status: DC
Start: 2020-11-10 — End: 2021-05-14

## 2020-11-13 ENCOUNTER — Telehealth: Payer: Self-pay | Admitting: Cardiology

## 2020-11-13 NOTE — Telephone Encounter (Signed)
Pt c/o medication issue:  1. Name of Medication: Metoprolol Tartrate  2. How are you currently taking this medication (dosage and times per day)? N/a  3. Are you having a reaction (difficulty breathing--STAT)? no  4. What is your medication issue? Dr. Stanford Breed discontinued this medication for the patient because her HR was getting too low on 11/08. Now, she states that her HR is getting too high. On 11/13 it was 75 and then 115. Around 1AM this morning her HR was 131 but went back down to 86. She states that her blood sugar is also high but will consult with her PCP about this.

## 2020-11-13 NOTE — Telephone Encounter (Signed)
Left message for patient to return call.

## 2020-11-14 ENCOUNTER — Telehealth: Payer: Self-pay | Admitting: Cardiology

## 2020-11-14 MED ORDER — METOPROLOL TARTRATE 25 MG PO TABS: 12.5000 mg | ORAL_TABLET | Freq: Two times a day (BID) | ORAL | 3 refills | Status: DC

## 2020-11-14 MED ORDER — NITROGLYCERIN 0.3 MG SL SUBL: 0.3000 mg | SUBLINGUAL_TABLET | SUBLINGUAL | 11 refills | Status: AC | PRN

## 2020-11-14 NOTE — Telephone Encounter (Signed)
Eddie is calling requesting to speak with Hilda Blades in regards to the recent medication change that was made. She states she has not been feeling well since stopping the metoprolol and is not sure what is wrong. She states her HR has been elevated up to 100 since stopping, when it was 41 prior to the change. She did not specify any further, but states Hilda Blades is aware of everything going on due to their prior conversation while she was in the office. Please advise.

## 2020-11-14 NOTE — Telephone Encounter (Signed)
Resume metoprolol 12.5 BID Kirk Ruths

## 2020-11-14 NOTE — Telephone Encounter (Signed)
See other telephone note - patient call back  anther encounter started . Will close this one

## 2020-11-14 NOTE — Telephone Encounter (Signed)
Spoke to patient .  There was another telephone call made yesterday . Will close that telephone  Encounter.  Patient states she just does not feel good.  Since stopping Metoprolol blood pressure ranges 126/87 to 147/81 and Heart ranges  70's to 80's but increases to a range-- 115 to 131  Patient states she was not doing anything strenuous when heart increase .  She states no fever   Cough is little better from office visit 11/06/20. Patient does not have pulse oximetry to check oxygen level.  patient states she is still taking Lasix and has lost 5 lbs since visit . She states her legs are less tender to the touch . Patient also states blood sugar is elevated  She states glucose level was 124 this morning . She states she will  contact Dr Brayton Layman about  Blood sugar. Patient also states she is in the process of [place].  she wanted to know what to do?  aware to defer to Dr Stanford Breed

## 2020-11-14 NOTE — Telephone Encounter (Signed)
Pt called in returning [place] phone call .  She stated that Hilda Blades would know what she was talking about and would like to speak to Hilda Blades only   Best number [phone]

## 2020-11-14 NOTE — Telephone Encounter (Signed)
Spoke with pt, Aware of dr crenshaw's recommendations. New script sent to the pharmacy  

## 2020-11-16 DIAGNOSIS — L821 Other seborrheic keratosis: Secondary | ICD-10-CM | POA: Diagnosis not present

## 2020-11-16 DIAGNOSIS — Z85828 Personal history of other malignant neoplasm of skin: Secondary | ICD-10-CM | POA: Diagnosis not present

## 2020-11-20 ENCOUNTER — Ambulatory Visit (HOSPITAL_COMMUNITY): Payer: PPO

## 2020-11-28 DIAGNOSIS — I480 Paroxysmal atrial fibrillation: Secondary | ICD-10-CM | POA: Diagnosis not present

## 2020-11-28 LAB — BASIC METABOLIC PANEL
BUN/Creatinine Ratio: 25 (ref 12–28)
BUN: 47 mg/dL — ABNORMAL HIGH (ref 8–27)
CO2: 26 mmol/L (ref 20–29)
Calcium: 9.3 mg/dL (ref 8.7–10.3)
Chloride: 100 mmol/L (ref 96–106)
Creatinine, Ser: 1.9 mg/dL — ABNORMAL HIGH (ref 0.57–1.00)
GFR calc Af Amer: 28 mL/min/{1.73_m2} — ABNORMAL LOW (ref >59)
GFR calc non Af Amer: 25 mL/min/{1.73_m2} — ABNORMAL LOW (ref >59)
Glucose: 264 mg/dL — ABNORMAL HIGH (ref 65–99)
Potassium: 5.1 mmol/L (ref 3.5–5.2)
Sodium: 138 mmol/L (ref 134–144)

## 2020-11-28 LAB — CBC
Hematocrit: 47.6 % — ABNORMAL HIGH (ref 34.0–46.6)
Hemoglobin: 15.9 g/dL (ref 11.1–15.9)
MCH: 28.3 pg (ref 26.6–33.0)
MCHC: 33.4 g/dL (ref 31.5–35.7)
MCV: 85 fL (ref 79–97)
Platelets: 293 10*3/uL (ref 150–450)
RBC: 5.61 x10E6/uL — ABNORMAL HIGH (ref 3.77–5.28)
RDW: 18.7 % — ABNORMAL HIGH (ref 11.7–15.4)
WBC: 10.4 10*3/uL (ref 3.4–10.8)

## 2020-11-29 ENCOUNTER — Telehealth: Payer: Self-pay | Admitting: *Deleted

## 2020-11-29 DIAGNOSIS — N289 Disorder of kidney and ureter, unspecified: Secondary | ICD-10-CM

## 2020-11-29 NOTE — Telephone Encounter (Signed)
   Pt is returning call, she asked if Hilda Blades can call her today since she is leaving tomorrow

## 2020-11-29 NOTE — Telephone Encounter (Addendum)
-----   Message from Lelon Perla, MD sent at 11/28/2020  4:58 PM EST ----- DC lasix; bmet one week Kirk Ruths   Left detailed message for the patient to stop the furosemide. Lab orders mailed to the pt

## 2020-11-29 NOTE — Telephone Encounter (Signed)
Spoke with pt, Aware of dr [name] recommendations. Lab orders mailed to the pt at her new address.

## 2020-12-01 NOTE — Telephone Encounter (Signed)
Left message for patient, the iron or prilosec will not make any changes to her kidney function. Recommend the patient stays off the furosemide and hopefully her kidney function will improve. She can call back next week to speak to me if she has more questions.

## 2020-12-01 NOTE — Telephone Encounter (Signed)
Patient calling in requesting to speak with Kathleen Griffith in regards to this same issue. She states a different doctor put her on iron 3x daily as well as prilosec 2x per day months ago when her hemoglobin was low. She wonders if she should still be taking all of that as well as if it could be affecting her labs. Please call

## 2020-12-11 ENCOUNTER — Telehealth: Payer: Self-pay | Admitting: Cardiology

## 2020-12-11 MED ORDER — METOPROLOL TARTRATE 25 MG PO TABS: 25.0000 mg | ORAL_TABLET | Freq: Two times a day (BID) | ORAL | 3 refills | Status: DC

## 2020-12-11 NOTE — Telephone Encounter (Signed)
Spoke to patient she stated she has been under a lot of stress the last few weeks.Stated they have move to [facility].Stated they have not received their furniture yet.Stated she has been in afib with heart rate 130 to 140.Stated 2 nights ago heart rate 168.She took a extra Metoprolol 25 mg.This morning heart rate 106.Stated she takes Metoprolol 25 mg 1/2 tablet twice a day.She wanted to ask Dr.Crenshaw should she take 25 mg twice a day.Stated she will be having a bmet done at [place] this afternoon.She stopped taking iron 1 &1/2 weeks ago thought that was causing her bun to be abnormal.Advised she should restart iron as prescribed.I will send message to [facility] for advice.

## 2020-12-11 NOTE — Telephone Encounter (Signed)
STAT if HR is under 50 or over 120 (normal HR is 60-100 beats per minute)  1) What is your heart rate? 106 this morning the highest was 168 a couple days ago.  2) Do you have a log of your heart rate readings (document readings)? No  3) Do you have any other symptoms?  Patient stopped taking Lasix & Alendronate a couple weeks ago patient is not sure of the exact date, she says she is now back in AFIB. Patient also wants to talk to Hilda Blades about a request for lab work.

## 2020-12-11 NOTE — Telephone Encounter (Signed)
Ok to increase metoprolol to 25 BID; needs to establish with cardiologist in [facility]

## 2020-12-11 NOTE — Telephone Encounter (Signed)
Left detailed message for patient, okay to increase metoprolol.

## 2020-12-12 LAB — BASIC METABOLIC PANEL
BUN/Creatinine Ratio: 24 (ref 12–28)
BUN: 31 mg/dL — ABNORMAL HIGH (ref 8–27)
CO2: 24 mmol/L (ref 20–29)
Calcium: 9.2 mg/dL (ref 8.7–10.3)
Chloride: 102 mmol/L (ref 96–106)
Creatinine, Ser: 1.29 mg/dL — ABNORMAL HIGH (ref 0.57–1.00)
GFR calc Af Amer: 46 mL/min/{1.73_m2} — ABNORMAL LOW (ref >59)
GFR calc non Af Amer: 39 mL/min/{1.73_m2} — ABNORMAL LOW (ref >59)
Glucose: 188 mg/dL — ABNORMAL HIGH (ref 65–99)
Potassium: 5 mmol/L (ref 3.5–5.2)
Sodium: 142 mmol/L (ref 134–144)

## 2020-12-13 ENCOUNTER — Encounter: Payer: Self-pay | Admitting: *Deleted

## 2020-12-15 ENCOUNTER — Telehealth: Payer: Self-pay | Admitting: Cardiology

## 2020-12-15 NOTE — Telephone Encounter (Signed)
Patient is requesting to review lab results. Please call.

## 2020-12-15 NOTE — Telephone Encounter (Signed)
Pt updated with lab results along with MD's recommendations. Pt verbalized understanding.  

## 2021-03-05 ENCOUNTER — Ambulatory Visit: Payer: PPO | Admitting: Cardiology

## 2021-05-14 ENCOUNTER — Other Ambulatory Visit: Payer: Self-pay | Admitting: Cardiology

## 2021-05-14 NOTE — Telephone Encounter (Signed)
64f, 71.3kg, scr 1.29 12/11/20, lovw/crenshaw 11/06/20, ccr 39.2

## 2021-06-22 ENCOUNTER — Other Ambulatory Visit: Payer: Self-pay | Admitting: Cardiology

## 2021-06-22 MED ORDER — RIVAROXABAN 15 MG PO TABS: ORAL_TABLET | ORAL | 1 refills | Status: AC

## 2021-06-22 NOTE — Telephone Encounter (Signed)
*  STAT* If patient is at the pharmacy, call can be transferred to refill team.   1. Which medications need to be refilled? (please list name of each medication and dose if known) metoprolol tartrate (LOPRESSOR) 25 MG tablet (Expired) XARELTO 15 MG TABS tablet  2. Which pharmacy/location (including street and city if local pharmacy) is medication to be sent to? [place], [place]  3. Do they need a 30 day or 90 day supply? [place]

## 2021-06-22 NOTE — Telephone Encounter (Signed)
80yo Female Dx Atrial fibrillation Wt 71.3kg Scr = 1.29 CrCl - 68ml/min Last OV with Dr Stanford Breed (11/06/2020)

## 2021-09-17 ENCOUNTER — Other Ambulatory Visit: Payer: Self-pay | Admitting: Gastroenterology

## 2021-09-18 ENCOUNTER — Telehealth: Payer: Self-pay

## 2021-09-18 NOTE — Telephone Encounter (Signed)
Prior Auth for omeprazole submitted via CoverMyMeds

## 2021-09-19 NOTE — Telephone Encounter (Signed)
PA approved for omeprazole.  ([employer]) - [id number] Omeprazole 20MG  dr capsules Status: PA Response - Approved

## 2021-10-31 ENCOUNTER — Other Ambulatory Visit: Payer: Self-pay | Admitting: Gastroenterology
